# Patient Record
Sex: Male | Born: 1945
Health system: Southern US, Community
[De-identification: ages and names within clinical notes are randomized; demographics above are authoritative.]

## PROBLEM LIST (undated history)

## (undated) DIAGNOSIS — I499 Cardiac arrhythmia, unspecified: Secondary | ICD-10-CM

## (undated) DIAGNOSIS — E785 Hyperlipidemia, unspecified: Secondary | ICD-10-CM

## (undated) DIAGNOSIS — E119 Type 2 diabetes mellitus without complications: Secondary | ICD-10-CM

## (undated) DIAGNOSIS — Z9981 Dependence on supplemental oxygen: Secondary | ICD-10-CM

## (undated) DIAGNOSIS — M549 Dorsalgia, unspecified: Secondary | ICD-10-CM

## (undated) DIAGNOSIS — Z9359 Other cystostomy status: Secondary | ICD-10-CM

## (undated) DIAGNOSIS — J449 Chronic obstructive pulmonary disease, unspecified: Secondary | ICD-10-CM

## (undated) DIAGNOSIS — R0600 Dyspnea, unspecified: Secondary | ICD-10-CM

## (undated) HISTORY — DX: Hyperlipidemia, unspecified: E78.5

## (undated) HISTORY — DX: Dorsalgia, unspecified: M54.9

## (undated) HISTORY — DX: Chronic obstructive pulmonary disease, unspecified: J44.9

## (undated) HISTORY — PX: OTHER SURGICAL HISTORY: SHX169

---

## 2005-05-24 ENCOUNTER — Ambulatory Visit: Payer: Self-pay | Admitting: Internal Medicine

## 2005-07-31 ENCOUNTER — Ambulatory Visit: Payer: Self-pay | Admitting: Internal Medicine

## 2005-07-31 ENCOUNTER — Ambulatory Visit (HOSPITAL_COMMUNITY): Admission: RE | Admit: 2005-07-31 | Discharge: 2005-07-31 | Payer: Self-pay | Admitting: Internal Medicine

## 2005-10-03 ENCOUNTER — Ambulatory Visit (HOSPITAL_COMMUNITY): Admission: RE | Admit: 2005-10-03 | Discharge: 2005-10-03 | Payer: Self-pay | Admitting: Pulmonary Disease

## 2005-10-31 ENCOUNTER — Ambulatory Visit: Payer: Self-pay | Admitting: Internal Medicine

## 2005-10-31 LAB — HM COLONOSCOPY

## 2006-11-11 IMAGING — CR DG CHEST 2V
2 series · 2 of 2 positions shown · non-contrast
Comparison: none

HISTORY: Cough, former smoker

CHEST 2 VIEWS:
No prior studies for comparison.
Normal heart size, mediastinal contours, and pulmonary vascularity.
Changes of COPD and bronchitis.
No infiltrate, effusion, or mass.
Minimal degenerative changes thoracic spine.
No acute process seen.

[view not recorded (1 of 2)]
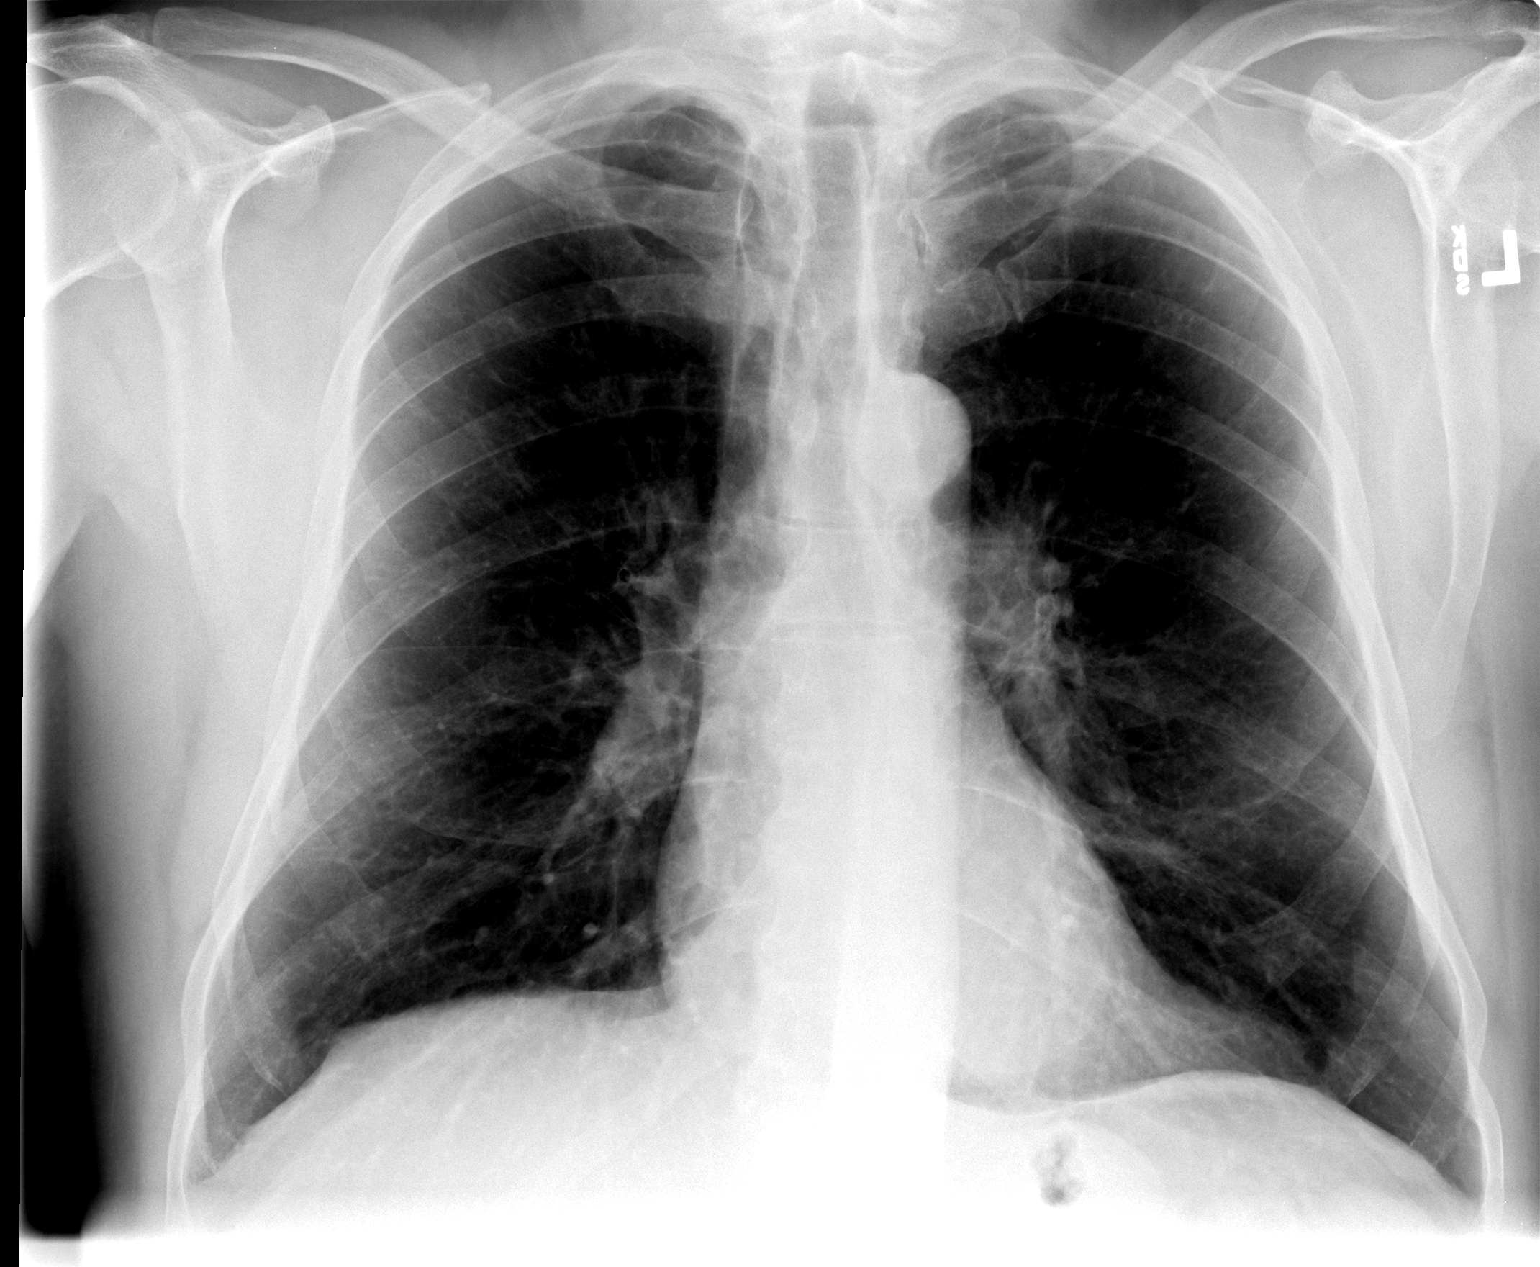

[view not recorded (2 of 2)]
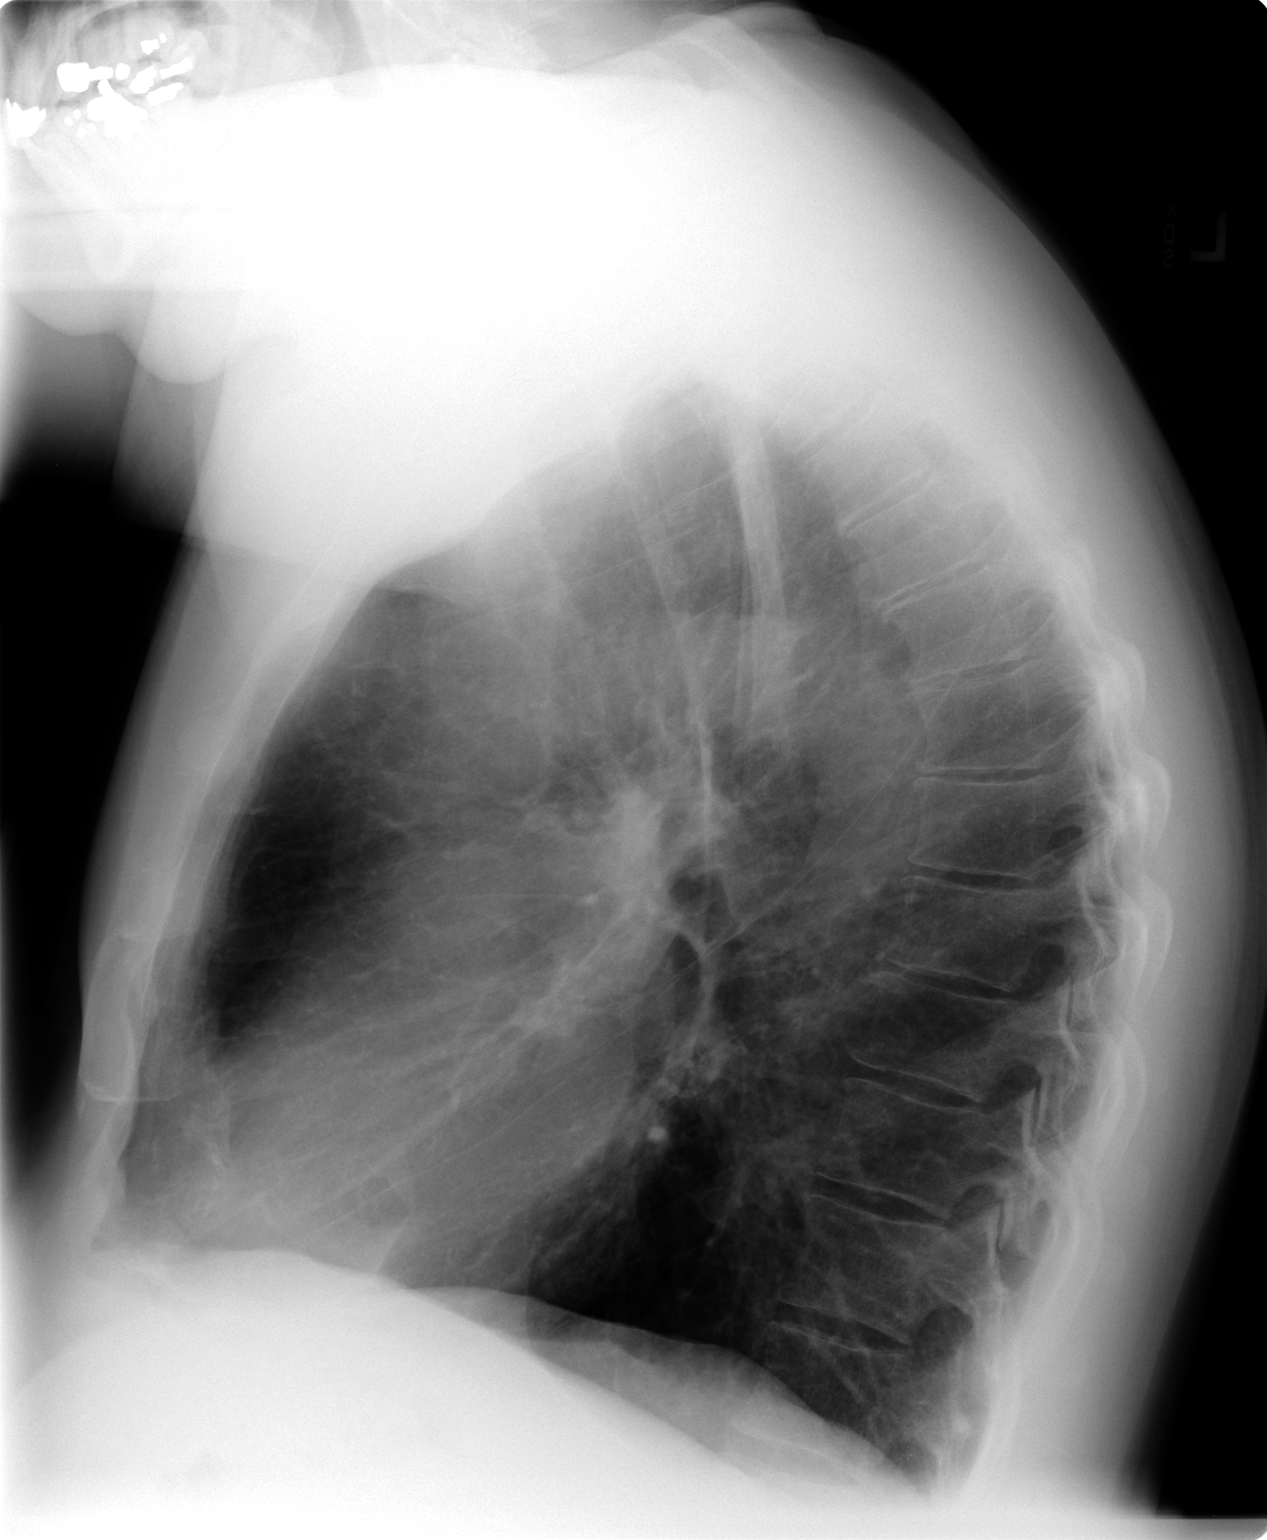

[2 of 2 positions shown; findings below may reference images not displayed]

IMPRESSION: COPD and bronchitic changes.
No acute infiltrate.

## 2011-04-20 DIAGNOSIS — E785 Hyperlipidemia, unspecified: Secondary | ICD-10-CM | POA: Diagnosis not present

## 2011-04-20 DIAGNOSIS — F3289 Other specified depressive episodes: Secondary | ICD-10-CM | POA: Diagnosis not present

## 2011-04-20 DIAGNOSIS — F17200 Nicotine dependence, unspecified, uncomplicated: Secondary | ICD-10-CM | POA: Diagnosis not present

## 2011-04-20 DIAGNOSIS — K2100 Gastro-esophageal reflux disease with esophagitis, without bleeding: Secondary | ICD-10-CM | POA: Diagnosis not present

## 2011-04-20 DIAGNOSIS — F329 Major depressive disorder, single episode, unspecified: Secondary | ICD-10-CM | POA: Diagnosis not present

## 2011-10-20 DIAGNOSIS — E785 Hyperlipidemia, unspecified: Secondary | ICD-10-CM | POA: Diagnosis not present

## 2011-10-20 DIAGNOSIS — K2100 Gastro-esophageal reflux disease with esophagitis, without bleeding: Secondary | ICD-10-CM | POA: Diagnosis not present

## 2011-10-20 DIAGNOSIS — M545 Low back pain, unspecified: Secondary | ICD-10-CM | POA: Diagnosis not present

## 2011-10-20 DIAGNOSIS — IMO0002 Reserved for concepts with insufficient information to code with codable children: Secondary | ICD-10-CM | POA: Diagnosis not present

## 2011-10-20 DIAGNOSIS — Z125 Encounter for screening for malignant neoplasm of prostate: Secondary | ICD-10-CM | POA: Diagnosis not present

## 2012-01-29 DIAGNOSIS — J029 Acute pharyngitis, unspecified: Secondary | ICD-10-CM | POA: Diagnosis not present

## 2012-04-17 DIAGNOSIS — M545 Low back pain, unspecified: Secondary | ICD-10-CM | POA: Diagnosis not present

## 2012-04-17 DIAGNOSIS — J449 Chronic obstructive pulmonary disease, unspecified: Secondary | ICD-10-CM | POA: Diagnosis not present

## 2012-04-17 DIAGNOSIS — E785 Hyperlipidemia, unspecified: Secondary | ICD-10-CM | POA: Diagnosis not present

## 2012-04-17 DIAGNOSIS — I10 Essential (primary) hypertension: Secondary | ICD-10-CM | POA: Diagnosis not present

## 2012-04-17 LAB — PULMONARY FUNCTION TEST

## 2012-05-27 DIAGNOSIS — M545 Low back pain, unspecified: Secondary | ICD-10-CM | POA: Diagnosis not present

## 2012-05-27 DIAGNOSIS — F17200 Nicotine dependence, unspecified, uncomplicated: Secondary | ICD-10-CM | POA: Diagnosis not present

## 2012-05-27 DIAGNOSIS — J3089 Other allergic rhinitis: Secondary | ICD-10-CM | POA: Diagnosis not present

## 2012-05-27 DIAGNOSIS — J449 Chronic obstructive pulmonary disease, unspecified: Secondary | ICD-10-CM | POA: Diagnosis not present

## 2012-05-29 ENCOUNTER — Telehealth: Payer: Self-pay

## 2012-05-29 NOTE — Telephone Encounter (Signed)
Pt was referred by Dr. Juanetta Gosling office for screening colonoscopy if it is time. According to our records, his last colonoscopy was 07/31/2005 by [facility]. Hyperplastic polyps, so next would be due in 07/2015 unless he is having problems.   I tried to call the pt at the number listed of [phone] and was told that it belongs to someone else.   I will mail the pt a letter to call our office.

## 2012-06-11 NOTE — Telephone Encounter (Signed)
Letter to pt

## 2012-06-17 NOTE — Telephone Encounter (Signed)
Requested the path report from [facility] medical records. He had hyperplastic polyp in 2007. Not due next  Colonoscopy until 07/2015. It is already nic'd in the computer. Pt is aware and said he is not having any problems and no new family hx of colon cancer. He will call if he has problems. Sending a letter to Dr. Juanetta Gosling.

## 2012-06-18 NOTE — Telephone Encounter (Signed)
Agree 

## 2012-08-27 DIAGNOSIS — K2100 Gastro-esophageal reflux disease with esophagitis, without bleeding: Secondary | ICD-10-CM | POA: Diagnosis not present

## 2012-08-27 DIAGNOSIS — M545 Low back pain, unspecified: Secondary | ICD-10-CM | POA: Diagnosis not present

## 2012-08-27 DIAGNOSIS — R609 Edema, unspecified: Secondary | ICD-10-CM | POA: Diagnosis not present

## 2012-08-27 DIAGNOSIS — J449 Chronic obstructive pulmonary disease, unspecified: Secondary | ICD-10-CM | POA: Diagnosis not present

## 2012-11-18 DIAGNOSIS — R609 Edema, unspecified: Secondary | ICD-10-CM | POA: Diagnosis not present

## 2012-11-18 DIAGNOSIS — M545 Low back pain, unspecified: Secondary | ICD-10-CM | POA: Diagnosis not present

## 2012-11-18 DIAGNOSIS — Z23 Encounter for immunization: Secondary | ICD-10-CM | POA: Diagnosis not present

## 2012-11-18 DIAGNOSIS — J449 Chronic obstructive pulmonary disease, unspecified: Secondary | ICD-10-CM | POA: Diagnosis not present

## 2012-11-27 DIAGNOSIS — J449 Chronic obstructive pulmonary disease, unspecified: Secondary | ICD-10-CM | POA: Diagnosis not present

## 2012-11-27 DIAGNOSIS — G4700 Insomnia, unspecified: Secondary | ICD-10-CM | POA: Diagnosis not present

## 2012-11-27 DIAGNOSIS — M545 Low back pain, unspecified: Secondary | ICD-10-CM | POA: Diagnosis not present

## 2012-11-27 DIAGNOSIS — E785 Hyperlipidemia, unspecified: Secondary | ICD-10-CM | POA: Diagnosis not present

## 2012-11-27 DIAGNOSIS — Z23 Encounter for immunization: Secondary | ICD-10-CM | POA: Diagnosis not present

## 2013-02-27 DIAGNOSIS — K2100 Gastro-esophageal reflux disease with esophagitis, without bleeding: Secondary | ICD-10-CM | POA: Diagnosis not present

## 2013-02-27 DIAGNOSIS — M545 Low back pain, unspecified: Secondary | ICD-10-CM | POA: Diagnosis not present

## 2013-02-27 DIAGNOSIS — J449 Chronic obstructive pulmonary disease, unspecified: Secondary | ICD-10-CM | POA: Diagnosis not present

## 2013-02-27 DIAGNOSIS — E785 Hyperlipidemia, unspecified: Secondary | ICD-10-CM | POA: Diagnosis not present

## 2013-03-05 DIAGNOSIS — E785 Hyperlipidemia, unspecified: Secondary | ICD-10-CM | POA: Diagnosis not present

## 2013-03-05 DIAGNOSIS — M545 Low back pain, unspecified: Secondary | ICD-10-CM | POA: Diagnosis not present

## 2013-03-05 DIAGNOSIS — J4489 Other specified chronic obstructive pulmonary disease: Secondary | ICD-10-CM | POA: Diagnosis not present

## 2013-03-05 DIAGNOSIS — J449 Chronic obstructive pulmonary disease, unspecified: Secondary | ICD-10-CM | POA: Diagnosis not present

## 2013-03-05 DIAGNOSIS — K2100 Gastro-esophageal reflux disease with esophagitis, without bleeding: Secondary | ICD-10-CM | POA: Diagnosis not present

## 2013-03-05 DIAGNOSIS — Z125 Encounter for screening for malignant neoplasm of prostate: Secondary | ICD-10-CM | POA: Diagnosis not present

## 2013-06-26 DIAGNOSIS — M545 Low back pain, unspecified: Secondary | ICD-10-CM | POA: Diagnosis not present

## 2013-06-26 DIAGNOSIS — J309 Allergic rhinitis, unspecified: Secondary | ICD-10-CM | POA: Diagnosis not present

## 2013-06-26 DIAGNOSIS — J449 Chronic obstructive pulmonary disease, unspecified: Secondary | ICD-10-CM | POA: Diagnosis not present

## 2013-06-26 DIAGNOSIS — K2100 Gastro-esophageal reflux disease with esophagitis, without bleeding: Secondary | ICD-10-CM | POA: Diagnosis not present

## 2013-10-21 DIAGNOSIS — E785 Hyperlipidemia, unspecified: Secondary | ICD-10-CM | POA: Diagnosis not present

## 2013-10-21 DIAGNOSIS — M545 Low back pain, unspecified: Secondary | ICD-10-CM | POA: Diagnosis not present

## 2013-10-21 DIAGNOSIS — J449 Chronic obstructive pulmonary disease, unspecified: Secondary | ICD-10-CM | POA: Diagnosis not present

## 2013-10-21 DIAGNOSIS — K2100 Gastro-esophageal reflux disease with esophagitis, without bleeding: Secondary | ICD-10-CM | POA: Diagnosis not present

## 2013-10-21 DIAGNOSIS — Z23 Encounter for immunization: Secondary | ICD-10-CM | POA: Diagnosis not present

## 2014-02-20 DIAGNOSIS — J449 Chronic obstructive pulmonary disease, unspecified: Secondary | ICD-10-CM | POA: Diagnosis not present

## 2014-02-20 DIAGNOSIS — E782 Mixed hyperlipidemia: Secondary | ICD-10-CM | POA: Diagnosis not present

## 2014-02-20 DIAGNOSIS — K210 Gastro-esophageal reflux disease with esophagitis: Secondary | ICD-10-CM | POA: Diagnosis not present

## 2014-02-20 DIAGNOSIS — M545 Low back pain: Secondary | ICD-10-CM | POA: Diagnosis not present

## 2014-06-22 DIAGNOSIS — K219 Gastro-esophageal reflux disease without esophagitis: Secondary | ICD-10-CM | POA: Diagnosis not present

## 2014-06-22 DIAGNOSIS — M545 Low back pain: Secondary | ICD-10-CM | POA: Diagnosis not present

## 2014-06-22 DIAGNOSIS — E782 Mixed hyperlipidemia: Secondary | ICD-10-CM | POA: Diagnosis not present

## 2014-06-22 DIAGNOSIS — J449 Chronic obstructive pulmonary disease, unspecified: Secondary | ICD-10-CM | POA: Diagnosis not present

## 2014-10-16 DIAGNOSIS — K219 Gastro-esophageal reflux disease without esophagitis: Secondary | ICD-10-CM | POA: Diagnosis not present

## 2014-10-16 DIAGNOSIS — M545 Low back pain: Secondary | ICD-10-CM | POA: Diagnosis not present

## 2014-10-16 DIAGNOSIS — E782 Mixed hyperlipidemia: Secondary | ICD-10-CM | POA: Diagnosis not present

## 2014-10-16 DIAGNOSIS — J449 Chronic obstructive pulmonary disease, unspecified: Secondary | ICD-10-CM | POA: Diagnosis not present

## 2015-06-21 ENCOUNTER — Telehealth: Payer: Self-pay | Admitting: Internal Medicine

## 2015-06-21 NOTE — Telephone Encounter (Signed)
RECALL FOR TCS °

## 2015-06-22 NOTE — Telephone Encounter (Signed)
Letter mailed to pt.  

## 2016-04-05 DIAGNOSIS — M79671 Pain in right foot: Secondary | ICD-10-CM | POA: Diagnosis not present

## 2016-04-05 DIAGNOSIS — E782 Mixed hyperlipidemia: Secondary | ICD-10-CM | POA: Diagnosis not present

## 2016-04-05 DIAGNOSIS — F321 Major depressive disorder, single episode, moderate: Secondary | ICD-10-CM | POA: Diagnosis not present

## 2016-04-05 DIAGNOSIS — J449 Chronic obstructive pulmonary disease, unspecified: Secondary | ICD-10-CM | POA: Diagnosis not present

## 2016-04-05 DIAGNOSIS — M545 Low back pain: Secondary | ICD-10-CM | POA: Diagnosis not present

## 2016-07-04 DIAGNOSIS — J449 Chronic obstructive pulmonary disease, unspecified: Secondary | ICD-10-CM | POA: Diagnosis not present

## 2016-07-04 DIAGNOSIS — K219 Gastro-esophageal reflux disease without esophagitis: Secondary | ICD-10-CM | POA: Diagnosis not present

## 2016-07-04 DIAGNOSIS — M545 Low back pain: Secondary | ICD-10-CM | POA: Diagnosis not present

## 2016-07-04 DIAGNOSIS — M1000 Idiopathic gout, unspecified site: Secondary | ICD-10-CM | POA: Diagnosis not present

## 2016-10-02 DIAGNOSIS — Z1211 Encounter for screening for malignant neoplasm of colon: Secondary | ICD-10-CM | POA: Diagnosis not present

## 2016-10-02 DIAGNOSIS — G4700 Insomnia, unspecified: Secondary | ICD-10-CM | POA: Diagnosis not present

## 2016-10-02 DIAGNOSIS — Z0000 Encounter for general adult medical examination without abnormal findings: Secondary | ICD-10-CM | POA: Diagnosis not present

## 2016-10-02 DIAGNOSIS — F321 Major depressive disorder, single episode, moderate: Secondary | ICD-10-CM | POA: Diagnosis not present

## 2016-10-02 DIAGNOSIS — M545 Low back pain: Secondary | ICD-10-CM | POA: Diagnosis not present

## 2016-10-02 DIAGNOSIS — K219 Gastro-esophageal reflux disease without esophagitis: Secondary | ICD-10-CM | POA: Diagnosis not present

## 2016-10-02 DIAGNOSIS — Z125 Encounter for screening for malignant neoplasm of prostate: Secondary | ICD-10-CM | POA: Diagnosis not present

## 2016-10-02 DIAGNOSIS — E782 Mixed hyperlipidemia: Secondary | ICD-10-CM | POA: Diagnosis not present

## 2016-10-02 DIAGNOSIS — M1000 Idiopathic gout, unspecified site: Secondary | ICD-10-CM | POA: Diagnosis not present

## 2016-10-02 DIAGNOSIS — J449 Chronic obstructive pulmonary disease, unspecified: Secondary | ICD-10-CM | POA: Diagnosis not present

## 2016-10-10 LAB — HEMOGLOBIN A1C: Hemoglobin A1C: 5.8

## 2016-10-27 DIAGNOSIS — Z1211 Encounter for screening for malignant neoplasm of colon: Secondary | ICD-10-CM | POA: Diagnosis not present

## 2016-11-30 DIAGNOSIS — Z23 Encounter for immunization: Secondary | ICD-10-CM | POA: Diagnosis not present

## 2017-04-04 DIAGNOSIS — M545 Low back pain: Secondary | ICD-10-CM | POA: Diagnosis not present

## 2017-04-04 DIAGNOSIS — J449 Chronic obstructive pulmonary disease, unspecified: Secondary | ICD-10-CM | POA: Diagnosis not present

## 2017-04-04 DIAGNOSIS — K210 Gastro-esophageal reflux disease with esophagitis: Secondary | ICD-10-CM | POA: Diagnosis not present

## 2017-04-04 DIAGNOSIS — E785 Hyperlipidemia, unspecified: Secondary | ICD-10-CM | POA: Diagnosis not present

## 2017-10-02 DIAGNOSIS — K210 Gastro-esophageal reflux disease with esophagitis: Secondary | ICD-10-CM | POA: Diagnosis not present

## 2017-10-02 DIAGNOSIS — E785 Hyperlipidemia, unspecified: Secondary | ICD-10-CM | POA: Diagnosis not present

## 2017-10-02 DIAGNOSIS — J449 Chronic obstructive pulmonary disease, unspecified: Secondary | ICD-10-CM | POA: Diagnosis not present

## 2017-10-02 DIAGNOSIS — M545 Low back pain: Secondary | ICD-10-CM | POA: Diagnosis not present

## 2017-10-02 DIAGNOSIS — Z23 Encounter for immunization: Secondary | ICD-10-CM | POA: Diagnosis not present

## 2017-10-03 DIAGNOSIS — J449 Chronic obstructive pulmonary disease, unspecified: Secondary | ICD-10-CM | POA: Diagnosis not present

## 2017-10-03 DIAGNOSIS — K210 Gastro-esophageal reflux disease with esophagitis: Secondary | ICD-10-CM | POA: Diagnosis not present

## 2017-10-03 DIAGNOSIS — E785 Hyperlipidemia, unspecified: Secondary | ICD-10-CM | POA: Diagnosis not present

## 2017-10-03 DIAGNOSIS — M545 Low back pain: Secondary | ICD-10-CM | POA: Diagnosis not present

## 2017-10-03 DIAGNOSIS — Z0000 Encounter for general adult medical examination without abnormal findings: Secondary | ICD-10-CM | POA: Diagnosis not present

## 2017-10-03 DIAGNOSIS — Z125 Encounter for screening for malignant neoplasm of prostate: Secondary | ICD-10-CM | POA: Diagnosis not present

## 2017-10-03 LAB — PSA: PSA: 1.7

## 2017-10-04 LAB — LIPID PANEL: LDL Cholesterol: 119

## 2017-12-18 DIAGNOSIS — Z23 Encounter for immunization: Secondary | ICD-10-CM | POA: Diagnosis not present

## 2018-04-02 DIAGNOSIS — M545 Low back pain: Secondary | ICD-10-CM | POA: Diagnosis not present

## 2018-04-02 DIAGNOSIS — M1000 Idiopathic gout, unspecified site: Secondary | ICD-10-CM | POA: Diagnosis not present

## 2018-04-02 DIAGNOSIS — J449 Chronic obstructive pulmonary disease, unspecified: Secondary | ICD-10-CM | POA: Diagnosis not present

## 2018-04-02 DIAGNOSIS — E785 Hyperlipidemia, unspecified: Secondary | ICD-10-CM | POA: Diagnosis not present

## 2018-09-06 ENCOUNTER — Other Ambulatory Visit: Payer: Self-pay

## 2018-10-01 DIAGNOSIS — Z0000 Encounter for general adult medical examination without abnormal findings: Secondary | ICD-10-CM | POA: Diagnosis not present

## 2018-10-01 DIAGNOSIS — Z23 Encounter for immunization: Secondary | ICD-10-CM | POA: Diagnosis not present

## 2018-10-02 ENCOUNTER — Other Ambulatory Visit (HOSPITAL_COMMUNITY): Payer: Self-pay | Admitting: Pulmonary Disease

## 2018-10-02 ENCOUNTER — Other Ambulatory Visit: Payer: Self-pay | Admitting: Pulmonary Disease

## 2018-10-02 DIAGNOSIS — J449 Chronic obstructive pulmonary disease, unspecified: Secondary | ICD-10-CM | POA: Diagnosis not present

## 2018-10-02 DIAGNOSIS — Z125 Encounter for screening for malignant neoplasm of prostate: Secondary | ICD-10-CM | POA: Diagnosis not present

## 2018-10-02 DIAGNOSIS — M79604 Pain in right leg: Secondary | ICD-10-CM

## 2018-10-02 DIAGNOSIS — M545 Low back pain: Secondary | ICD-10-CM | POA: Diagnosis not present

## 2018-10-02 DIAGNOSIS — K219 Gastro-esophageal reflux disease without esophagitis: Secondary | ICD-10-CM | POA: Diagnosis not present

## 2018-10-02 DIAGNOSIS — Z0000 Encounter for general adult medical examination without abnormal findings: Secondary | ICD-10-CM | POA: Diagnosis not present

## 2018-10-02 DIAGNOSIS — M1000 Idiopathic gout, unspecified site: Secondary | ICD-10-CM | POA: Diagnosis not present

## 2018-10-02 DIAGNOSIS — R2241 Localized swelling, mass and lump, right lower limb: Secondary | ICD-10-CM

## 2018-10-02 DIAGNOSIS — M79671 Pain in right foot: Secondary | ICD-10-CM | POA: Diagnosis not present

## 2018-10-02 DIAGNOSIS — R2242 Localized swelling, mass and lump, left lower limb: Secondary | ICD-10-CM

## 2018-10-02 DIAGNOSIS — E782 Mixed hyperlipidemia: Secondary | ICD-10-CM | POA: Diagnosis not present

## 2018-10-02 DIAGNOSIS — R739 Hyperglycemia, unspecified: Secondary | ICD-10-CM | POA: Diagnosis not present

## 2018-10-02 DIAGNOSIS — G4700 Insomnia, unspecified: Secondary | ICD-10-CM | POA: Diagnosis not present

## 2018-10-03 LAB — BASIC METABOLIC PANEL
BUN: 19 (ref 4–21)
CO2: 27 — AB (ref 13–22)
Chloride: 106 (ref 99–108)
Creatinine: 0.9 (ref <=1.3)
Glucose: 144
Potassium: 3.8 (ref 3.4–5.3)
Sodium: 142 (ref 137–147)

## 2018-10-03 LAB — PSA: PSA: 3.1

## 2018-10-03 LAB — HEPATIC FUNCTION PANEL
ALT: 17 (ref 10–40)
AST: 13 — AB (ref 14–40)
Alkaline Phosphatase: 88 (ref 25–125)
Bilirubin, Total: 0.7

## 2018-10-03 LAB — COMPREHENSIVE METABOLIC PANEL
Albumin: 4.2 (ref 3.5–5.0)
Calcium: 8.9 (ref 8.7–10.7)
GFR calc Af Amer: 99
GFR calc non Af Amer: 86
Globulin: 2.3

## 2018-10-03 LAB — LIPID PANEL
Cholesterol: 161 (ref 0–200)
HDL: 42 (ref 35–70)
LDL Cholesterol: 93
Triglycerides: 165 — AB (ref 40–160)

## 2018-10-03 LAB — CBC AND DIFFERENTIAL
HCT: 50 (ref 41–53)
Hemoglobin: 17.1 (ref 13.5–17.5)
Platelets: 216 (ref 150–399)
WBC: 9.2

## 2018-10-03 LAB — HEMOGLOBIN A1C: Hemoglobin A1C: 6.6

## 2018-10-03 LAB — TSH: TSH: 1 (ref <=5.90)

## 2018-10-03 LAB — CBC: RBC: 5.44 — AB (ref 3.87–5.11)

## 2018-10-07 ENCOUNTER — Other Ambulatory Visit: Payer: Self-pay

## 2018-10-07 ENCOUNTER — Ambulatory Visit (HOSPITAL_COMMUNITY)
Admission: RE | Admit: 2018-10-07 | Discharge: 2018-10-07 | Disposition: A | Discharge status: Home / Self Care | Payer: Medicare Other | Source: Ambulatory Visit | Attending: Pulmonary Disease | Admitting: Pulmonary Disease

## 2018-10-07 DIAGNOSIS — M7989 Other specified soft tissue disorders: Secondary | ICD-10-CM | POA: Diagnosis not present

## 2018-10-07 DIAGNOSIS — R2241 Localized swelling, mass and lump, right lower limb: Secondary | ICD-10-CM

## 2018-10-07 DIAGNOSIS — R2242 Localized swelling, mass and lump, left lower limb: Secondary | ICD-10-CM | POA: Diagnosis not present

## 2018-10-08 ENCOUNTER — Ambulatory Visit: Payer: Self-pay | Admitting: Family Medicine

## 2018-10-08 ENCOUNTER — Encounter: Payer: Self-pay | Admitting: Family Medicine

## 2018-10-08 ENCOUNTER — Ambulatory Visit (INDEPENDENT_AMBULATORY_CARE_PROVIDER_SITE_OTHER): Payer: Medicare Other | Admitting: Family Medicine

## 2018-10-08 ENCOUNTER — Other Ambulatory Visit: Payer: Self-pay

## 2018-10-08 VITALS — BP 135/82 | HR 67 | Temp 97.9°F | Ht 71.0 in | Wt 192.4 lb

## 2018-10-08 DIAGNOSIS — E785 Hyperlipidemia, unspecified: Secondary | ICD-10-CM | POA: Insufficient documentation

## 2018-10-08 DIAGNOSIS — J449 Chronic obstructive pulmonary disease, unspecified: Secondary | ICD-10-CM

## 2018-10-08 DIAGNOSIS — R600 Localized edema: Secondary | ICD-10-CM | POA: Diagnosis not present

## 2018-10-08 NOTE — Patient Instructions (Signed)
Pt to call if lung cancer screening desired Elevate legs Consider mild compression support socks
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
## 2018-10-15 DIAGNOSIS — Z1212 Encounter for screening for malignant neoplasm of rectum: Secondary | ICD-10-CM | POA: Diagnosis not present

## 2018-10-15 DIAGNOSIS — Z1211 Encounter for screening for malignant neoplasm of colon: Secondary | ICD-10-CM | POA: Diagnosis not present

## 2018-11-07 NOTE — Progress Notes (Signed)
This encounter was created in error - please disregard.

## 2019-04-07 ENCOUNTER — Ambulatory Visit (INDEPENDENT_AMBULATORY_CARE_PROVIDER_SITE_OTHER): Payer: Medicare Other | Admitting: Family Medicine

## 2019-04-07 ENCOUNTER — Other Ambulatory Visit: Payer: Self-pay

## 2019-04-07 ENCOUNTER — Encounter: Payer: Self-pay | Admitting: Family Medicine

## 2019-04-07 VITALS — BP 150/80 | HR 68 | Temp 98.4°F | Ht 71.0 in | Wt 199.0 lb

## 2019-04-07 DIAGNOSIS — M109 Gout, unspecified: Secondary | ICD-10-CM

## 2019-04-07 DIAGNOSIS — J449 Chronic obstructive pulmonary disease, unspecified: Secondary | ICD-10-CM | POA: Diagnosis not present

## 2019-04-07 DIAGNOSIS — R030 Elevated blood-pressure reading, without diagnosis of hypertension: Secondary | ICD-10-CM

## 2019-04-07 DIAGNOSIS — R7309 Other abnormal glucose: Secondary | ICD-10-CM

## 2019-04-07 DIAGNOSIS — E785 Hyperlipidemia, unspecified: Secondary | ICD-10-CM | POA: Diagnosis not present

## 2019-04-07 NOTE — Progress Notes (Signed)
Established Patient Office Visit  Subjective:  Patient ID: James Miles, male    DOB: December 13, 1945  Age: 74 y.o. MRN: [id number]  CC:  Chief Complaint  Patient presents with  . Follow-up    6 month    HPI James Miles presents for COPD-Ventolin 2x day.   Hyperlipidemia-lipitor daily Gout- allopurinol daily-no recent attack  Past Medical History:  Diagnosis Date  . Back pain   . COPD (chronic obstructive pulmonary disease) (HCC)   . Hyperlipidemia     Past Surgical History:  Procedure Laterality Date  . hemorhoidectomy      History reviewed. No pertinent family history.  Social History   Socioeconomic History  . Marital status: Widowed    Spouse name: Not on file  . Number of children: 2  . Years of education: Not on file  . Highest education level: Not on file  Occupational History  . Not on file  Tobacco Use  . Smoking status: Former Smoker    Years: 50.00    Quit date: 08/2016    Years since quitting: 2.6  . Smokeless tobacco: Never Used  Substance and Sexual Activity  . Alcohol use: Not on file  . Drug use: Never  . Sexual activity: Not Currently  Other Topics Concern  . Not on file  Social History Narrative  . Not on file   Social Determinants of Health   Financial Resource Strain:   . Difficulty of Paying Living Expenses: Not on file  Food Insecurity:   . Worried About Programme researcher, broadcasting/film/video in the Last Year: Not on file  . Ran Out of Food in the Last Year: Not on file  Transportation Needs:   . Lack of Transportation (Medical): Not on file  . Lack of Transportation (Non-Medical): Not on file  Physical Activity:   . Days of Exercise per Week: Not on file  . Minutes of Exercise per Session: Not on file  Stress:   . Feeling of Stress : Not on file  Social Connections:   . Frequency of Communication with Friends and Family: Not on file  . Frequency of Social Gatherings with Friends and Family: Not on file  . Attends Religious Services:  Not on file  . Active Member of Clubs or Organizations: Not on file  . Attends Banker Meetings: Not on file  . Marital Status: Not on file  Intimate Partner Violence:   . Fear of Current or Ex-Partner: Not on file  . Emotionally Abused: Not on file  . Physically Abused: Not on file  . Sexually Abused: Not on file    Outpatient Medications Prior to Visit  Medication Sig Dispense Refill  . albuterol (VENTOLIN HFA) 108 (90 Base) MCG/ACT inhaler SMARTSIG:2 Puff(s) Via Inhaler 4 Times Daily PRN    . allopurinol (ZYLOPRIM) 300 MG tablet Take 300 mg by mouth daily.    Marland Kitchen atorvastatin (LIPITOR) 20 MG tablet Take 20 mg by mouth daily.     No facility-administered medications prior to visit.    No Known Allergies  ROS Review of Systems  Constitutional: Negative.   HENT: Negative.   Eyes: Negative.   Respiratory:       COPD  Cardiovascular: Negative.   Gastrointestinal: Negative.   Endocrine: Negative.   Genitourinary: Negative.   Skin: Negative.   Neurological: Negative.   Hematological: Negative.   Psychiatric/Behavioral: Negative.       Objective:    Physical Exam  Constitutional: He is  oriented to person, place, and time. He appears well-developed and well-nourished.  HENT:  Head: Normocephalic and atraumatic.  Eyes: Conjunctivae are normal.  Cardiovascular: Normal rate, regular rhythm, normal heart sounds and intact distal pulses.  Pulmonary/Chest: Effort normal and breath sounds normal.  Musculoskeletal:        General: Edema present.     Cervical back: Normal range of motion and neck supple.  Neurological: He is oriented to person, place, and time.  Psychiatric: He has a normal mood and affect. His behavior is normal.   Right manual recheck 142/82  BP (!) 150/80 (BP Location: Left Arm, Patient Position: Sitting)   Pulse 68   Temp 98.4 F (36.9 C) (Temporal)   Ht 5\' 11"  (1.803 m)   Wt 199 lb (90.3 kg)   SpO2 93%   BMI 27.75 kg/m  Wt Readings  from Last 3 Encounters:  04/07/19 199 lb (90.3 kg)  10/08/18 192 lb 6.4 oz (87.3 kg)     Health Maintenance Due  Topic Date Due  . PNA vac Low Risk Adult (1 of 2 - PCV13) 04/21/2011  . COLONOSCOPY  11/01/2015    Lab Results  Component Value Date   LDLCALC 119 10/03/2017   Lab Results  Component Value Date   HGBA1C 5.8 10/02/2016      Assessment & Plan:  1. Hyperlipidemia, unspecified hyperlipidemia type lipitor-pt had lipid panel last fall-will check with [facility]  2. Chronic obstructive pulmonary disease, unspecified COPD type ([place]) proventil MDI prn  3. Gout, unspecified cause, unspecified chronicity, unspecified site Allopurinol-no recent gout attacks  Follow-up: 6 months  Yesika Rispoli Hannah Beat, MD

## 2019-04-07 NOTE — Patient Instructions (Addendum)
Follow up with primary care every 6 months Consider a screening CT lung  Will recheck on labwork for follow up-fasting labwork suggested

## 2019-04-08 ENCOUNTER — Encounter: Payer: Self-pay | Admitting: Family Medicine

## 2019-04-10 DIAGNOSIS — M109 Gout, unspecified: Secondary | ICD-10-CM | POA: Diagnosis not present

## 2019-04-10 DIAGNOSIS — E785 Hyperlipidemia, unspecified: Secondary | ICD-10-CM | POA: Diagnosis not present

## 2019-04-10 DIAGNOSIS — R7309 Other abnormal glucose: Secondary | ICD-10-CM | POA: Diagnosis not present

## 2019-04-11 LAB — COMPLETE METABOLIC PANEL WITH GFR
AG Ratio: 1.8 (calc) (ref 1.0–2.5)
ALT: 16 U/L (ref 9–46)
AST: 13 U/L (ref 10–35)
Albumin: 4.4 g/dL (ref 3.6–5.1)
Alkaline phosphatase (APISO): 94 U/L (ref 35–144)
BUN: 12 mg/dL (ref 7–25)
CO2: 30 mmol/L (ref 20–32)
Calcium: 9.4 mg/dL (ref 8.6–10.3)
Chloride: 101 mmol/L (ref 98–110)
Creat: 0.73 mg/dL (ref 0.70–1.18)
GFR, Est African American: 107 mL/min/{1.73_m2} (ref >=60)
GFR, Est Non African American: 93 mL/min/{1.73_m2} (ref >=60)
Globulin: 2.4 g/dL (calc) (ref 1.9–3.7)
Glucose, Bld: 120 mg/dL — ABNORMAL HIGH (ref 65–99)
Potassium: 4 mmol/L (ref 3.5–5.3)
Sodium: 140 mmol/L (ref 135–146)
Total Bilirubin: 0.6 mg/dL (ref 0.2–1.2)
Total Protein: 6.8 g/dL (ref 6.1–8.1)

## 2019-04-11 LAB — URIC ACID: Uric Acid, Serum: 4.7 mg/dL (ref 4.0–8.0)

## 2019-04-11 LAB — HEMOGLOBIN A1C
Hgb A1c MFr Bld: 6.9 % of total Hgb — ABNORMAL HIGH (ref <5.7)
Mean Plasma Glucose: 151 (calc)
eAG (mmol/L): 8.4 (calc)

## 2019-05-01 DIAGNOSIS — R600 Localized edema: Secondary | ICD-10-CM | POA: Diagnosis not present

## 2019-05-01 DIAGNOSIS — M109 Gout, unspecified: Secondary | ICD-10-CM | POA: Diagnosis not present

## 2019-05-01 DIAGNOSIS — E785 Hyperlipidemia, unspecified: Secondary | ICD-10-CM | POA: Diagnosis not present

## 2019-05-01 DIAGNOSIS — J449 Chronic obstructive pulmonary disease, unspecified: Secondary | ICD-10-CM | POA: Diagnosis not present

## 2019-05-29 DIAGNOSIS — E785 Hyperlipidemia, unspecified: Secondary | ICD-10-CM | POA: Diagnosis not present

## 2019-05-29 DIAGNOSIS — Z79899 Other long term (current) drug therapy: Secondary | ICD-10-CM | POA: Diagnosis not present

## 2019-06-03 DIAGNOSIS — E785 Hyperlipidemia, unspecified: Secondary | ICD-10-CM | POA: Diagnosis not present

## 2019-06-03 DIAGNOSIS — E1140 Type 2 diabetes mellitus with diabetic neuropathy, unspecified: Secondary | ICD-10-CM | POA: Diagnosis not present

## 2019-06-03 DIAGNOSIS — J449 Chronic obstructive pulmonary disease, unspecified: Secondary | ICD-10-CM | POA: Diagnosis not present

## 2019-06-03 DIAGNOSIS — M109 Gout, unspecified: Secondary | ICD-10-CM | POA: Diagnosis not present

## 2019-06-03 DIAGNOSIS — R600 Localized edema: Secondary | ICD-10-CM | POA: Diagnosis not present

## 2019-06-03 DIAGNOSIS — Z79899 Other long term (current) drug therapy: Secondary | ICD-10-CM | POA: Diagnosis not present

## 2019-06-03 DIAGNOSIS — Z0001 Encounter for general adult medical examination with abnormal findings: Secondary | ICD-10-CM | POA: Diagnosis not present

## 2019-08-28 DIAGNOSIS — E1140 Type 2 diabetes mellitus with diabetic neuropathy, unspecified: Secondary | ICD-10-CM | POA: Diagnosis not present

## 2019-08-28 DIAGNOSIS — E785 Hyperlipidemia, unspecified: Secondary | ICD-10-CM | POA: Diagnosis not present

## 2019-09-03 DIAGNOSIS — Z79899 Other long term (current) drug therapy: Secondary | ICD-10-CM | POA: Diagnosis not present

## 2019-09-03 DIAGNOSIS — Z7984 Long term (current) use of oral hypoglycemic drugs: Secondary | ICD-10-CM | POA: Diagnosis not present

## 2019-09-03 DIAGNOSIS — R600 Localized edema: Secondary | ICD-10-CM | POA: Diagnosis not present

## 2019-09-03 DIAGNOSIS — E785 Hyperlipidemia, unspecified: Secondary | ICD-10-CM | POA: Diagnosis not present

## 2019-09-03 DIAGNOSIS — M109 Gout, unspecified: Secondary | ICD-10-CM | POA: Diagnosis not present

## 2019-09-03 DIAGNOSIS — E119 Type 2 diabetes mellitus without complications: Secondary | ICD-10-CM | POA: Diagnosis not present

## 2019-10-01 DIAGNOSIS — E119 Type 2 diabetes mellitus without complications: Secondary | ICD-10-CM | POA: Diagnosis not present

## 2019-10-01 DIAGNOSIS — E785 Hyperlipidemia, unspecified: Secondary | ICD-10-CM | POA: Diagnosis not present

## 2019-10-01 DIAGNOSIS — M109 Gout, unspecified: Secondary | ICD-10-CM | POA: Diagnosis not present

## 2019-10-01 DIAGNOSIS — Z79899 Other long term (current) drug therapy: Secondary | ICD-10-CM | POA: Diagnosis not present

## 2019-10-01 DIAGNOSIS — R600 Localized edema: Secondary | ICD-10-CM | POA: Diagnosis not present

## 2019-10-24 DIAGNOSIS — Z23 Encounter for immunization: Secondary | ICD-10-CM | POA: Diagnosis not present

## 2019-11-15 IMAGING — US US EXTREM LOW VENOUS
1 series · 13 of 24 positions shown · non-contrast
Comparison: None.

CLINICAL DATA: Bilateral lower extremity swelling.



[Series 1: us extrem low venous · 13 of 69 slices shown]
[im 1/69]
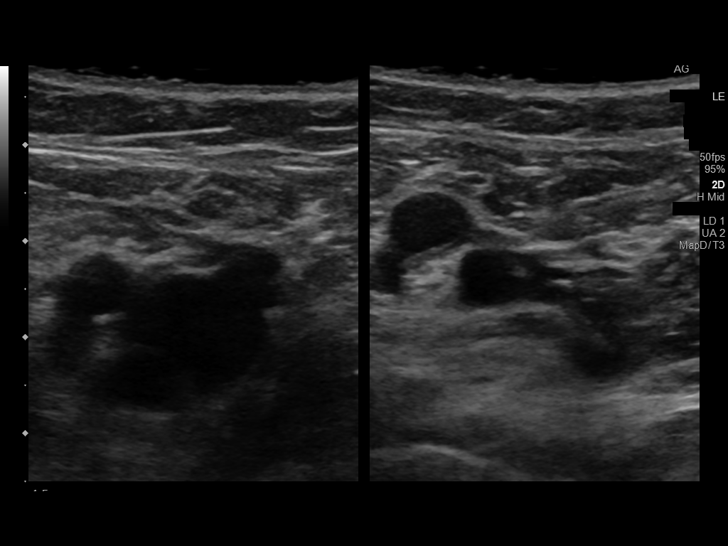
[im 6/69]
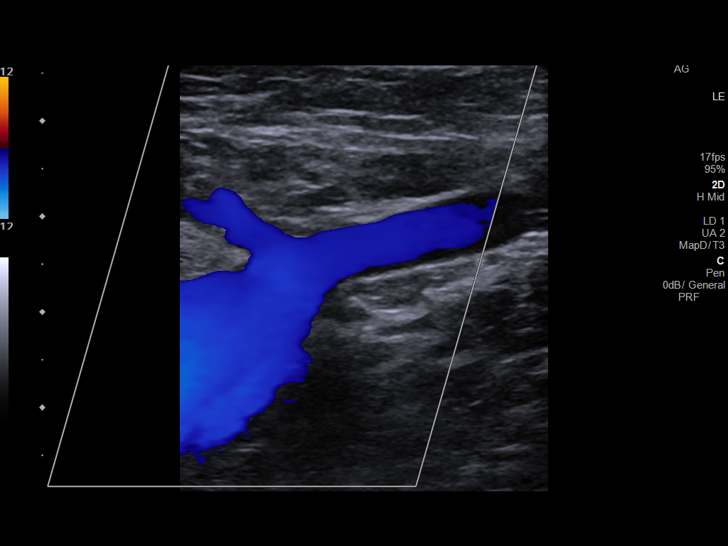
[im 12/69]
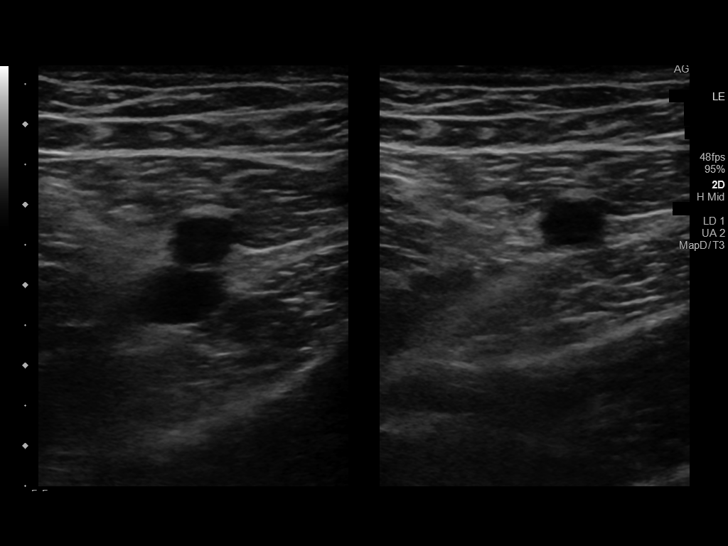
[im 18/69]
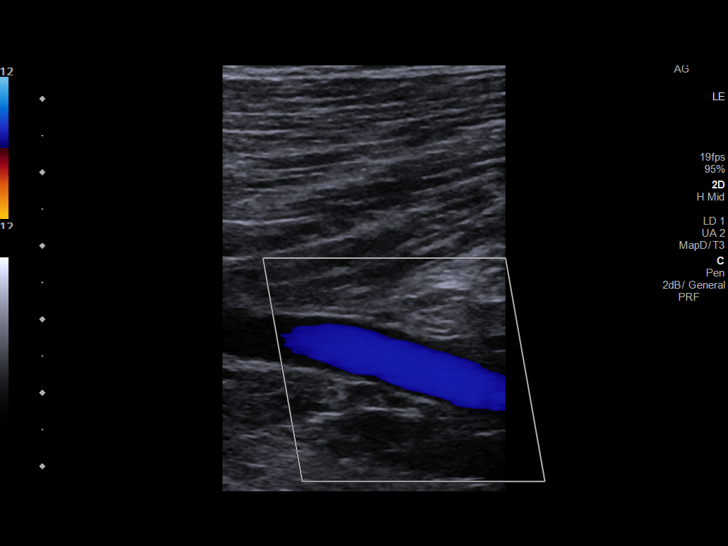
[im 24/69]
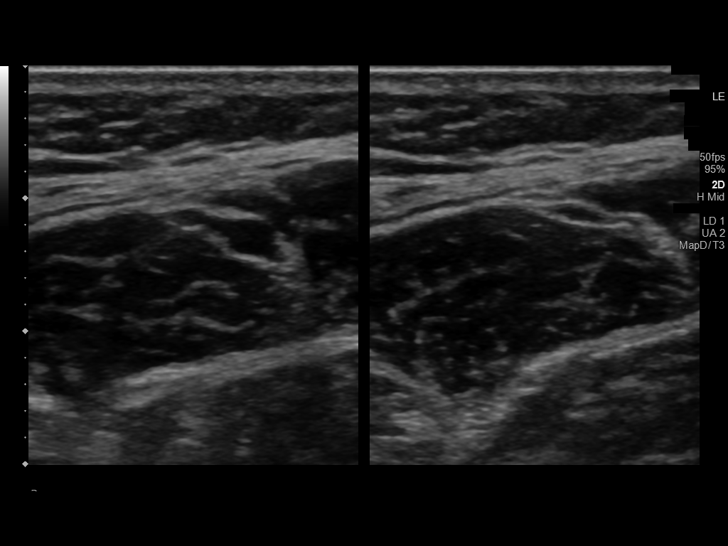
[im 30/69]
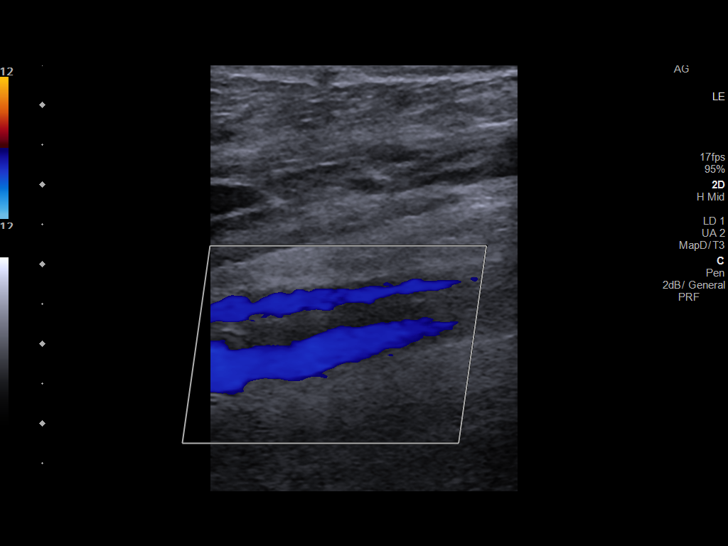
[im 36/69]
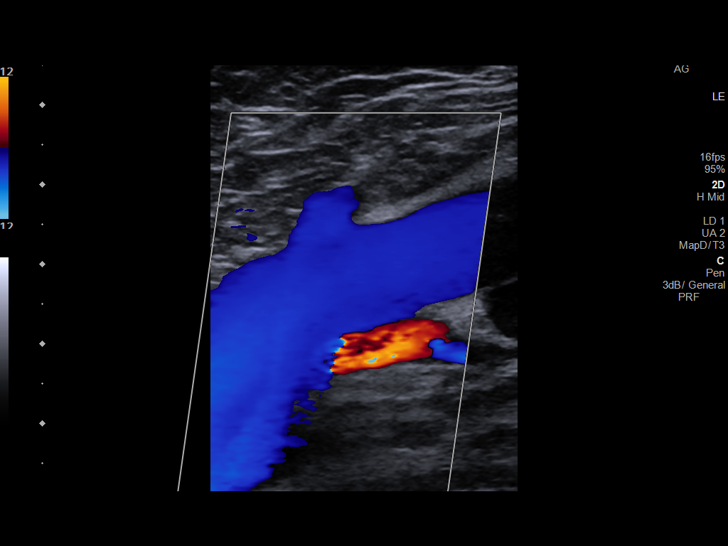
[im 39/69]
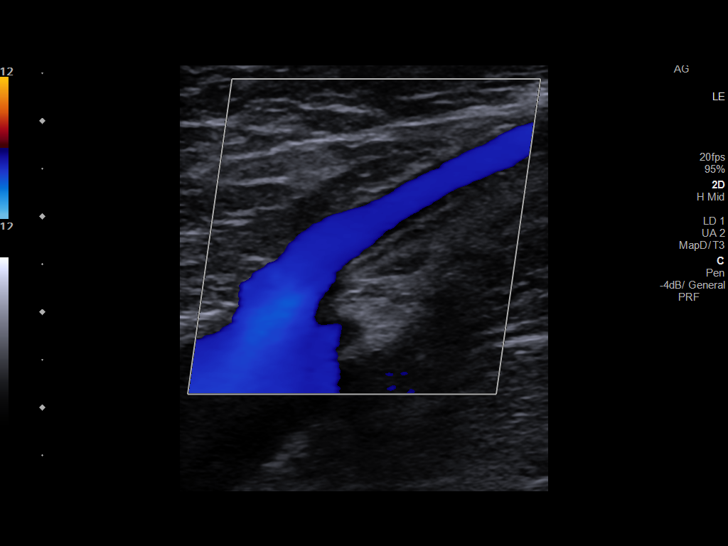
[im 45/69]
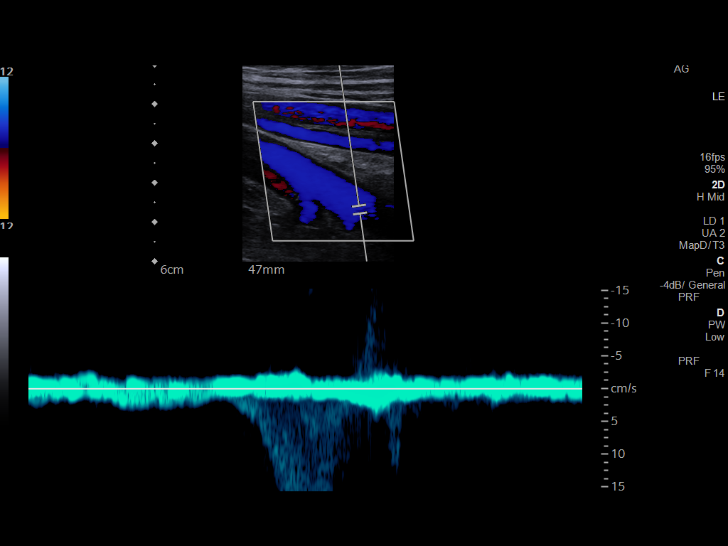
[im 51/69]
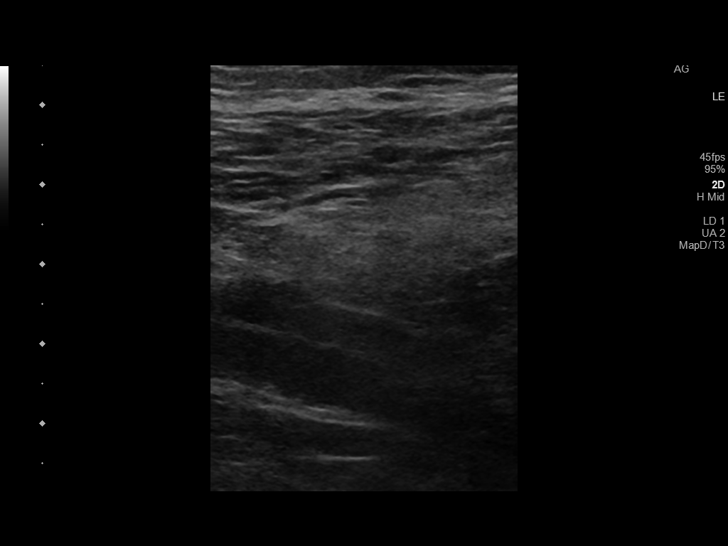
[im 57/69]
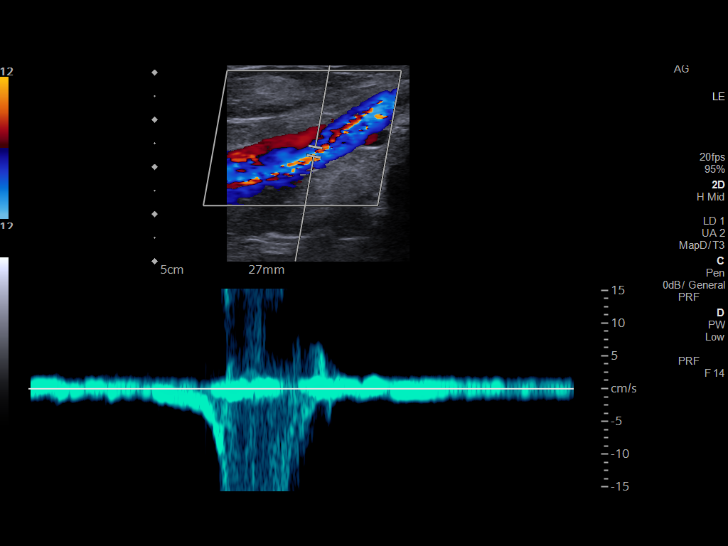
[im 63/69]
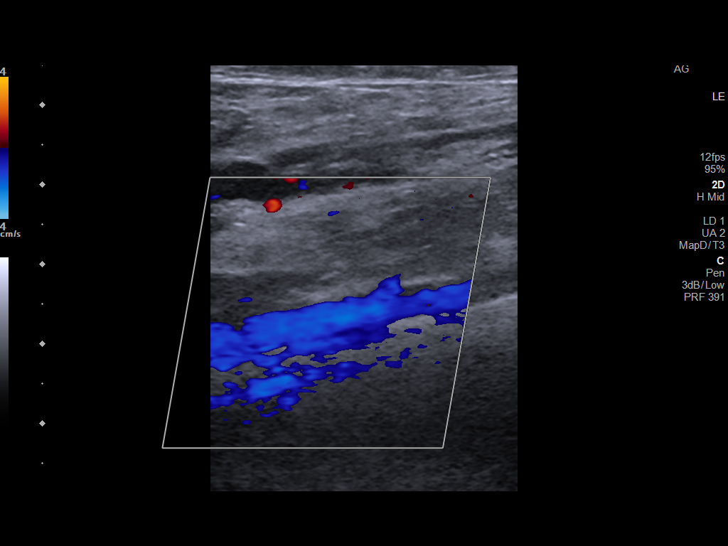
[im 69/69]
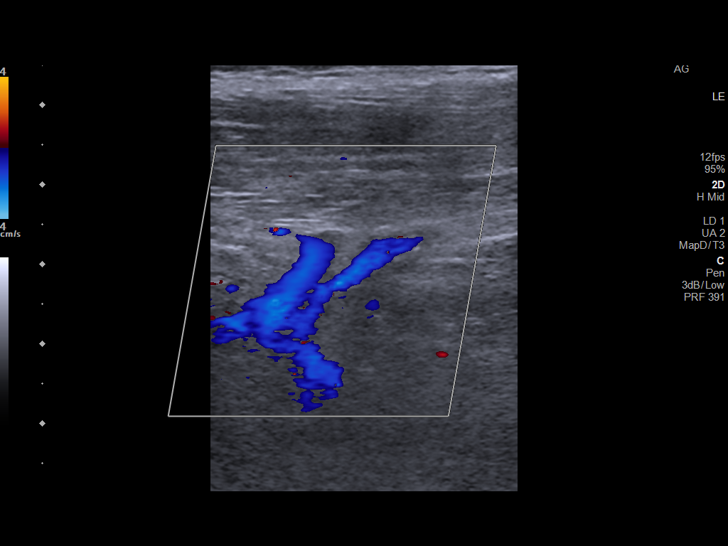

[13 of 24 positions shown; findings below may reference images not displayed]

FINDINGS: RIGHT LOWER EXTREMITY

Common Femoral Vein: No evidence of thrombus. Normal
compressibility, respiratory phasicity and response to augmentation.

Saphenofemoral Junction: No evidence of thrombus. Normal
compressibility and flow on color Doppler imaging.

Profunda Femoral Vein: No evidence of thrombus. Normal
compressibility and flow on color Doppler imaging.

Femoral Vein: No evidence of thrombus. Normal compressibility,
respiratory phasicity and response to augmentation.

Popliteal Vein: No evidence of thrombus. Normal compressibility,
respiratory phasicity and response to augmentation.

Calf Veins: No evidence of thrombus. Normal compressibility and flow
on color Doppler imaging.

Superficial Great Saphenous Vein: No evidence of thrombus. Normal
compressibility.

Other Findings:  None.

LEFT LOWER EXTREMITY

Common Femoral Vein: No evidence of thrombus. Normal
compressibility, respiratory phasicity and response to augmentation.

Saphenofemoral Junction: No evidence of thrombus. Normal
compressibility and flow on color Doppler imaging.

Profunda Femoral Vein: No evidence of thrombus. Normal
compressibility and flow on color Doppler imaging.

Femoral Vein: No evidence of thrombus. Normal compressibility,
respiratory phasicity and response to augmentation.

Popliteal Vein: No evidence of thrombus. Normal compressibility,
respiratory phasicity and response to augmentation.

Calf Veins: No evidence of thrombus. Normal compressibility and flow
on color Doppler imaging.

Other Findings: None.
IMPRESSION: No evidence of deep venous thrombosis in either lower extremity.

## 2019-11-21 DIAGNOSIS — Z23 Encounter for immunization: Secondary | ICD-10-CM | POA: Diagnosis not present

## 2019-12-01 DIAGNOSIS — Z79899 Other long term (current) drug therapy: Secondary | ICD-10-CM | POA: Diagnosis not present

## 2019-12-01 DIAGNOSIS — Z7984 Long term (current) use of oral hypoglycemic drugs: Secondary | ICD-10-CM | POA: Diagnosis not present

## 2019-12-01 DIAGNOSIS — Z23 Encounter for immunization: Secondary | ICD-10-CM | POA: Diagnosis not present

## 2019-12-01 DIAGNOSIS — E119 Type 2 diabetes mellitus without complications: Secondary | ICD-10-CM | POA: Diagnosis not present

## 2019-12-01 DIAGNOSIS — E785 Hyperlipidemia, unspecified: Secondary | ICD-10-CM | POA: Diagnosis not present

## 2019-12-01 DIAGNOSIS — R600 Localized edema: Secondary | ICD-10-CM | POA: Diagnosis not present

## 2020-01-22 DIAGNOSIS — R600 Localized edema: Secondary | ICD-10-CM | POA: Diagnosis not present

## 2020-01-22 DIAGNOSIS — M109 Gout, unspecified: Secondary | ICD-10-CM | POA: Diagnosis not present

## 2020-01-22 DIAGNOSIS — E785 Hyperlipidemia, unspecified: Secondary | ICD-10-CM | POA: Diagnosis not present

## 2020-01-22 DIAGNOSIS — Z79899 Other long term (current) drug therapy: Secondary | ICD-10-CM | POA: Diagnosis not present

## 2020-01-22 DIAGNOSIS — E119 Type 2 diabetes mellitus without complications: Secondary | ICD-10-CM | POA: Diagnosis not present

## 2020-01-28 DIAGNOSIS — J449 Chronic obstructive pulmonary disease, unspecified: Secondary | ICD-10-CM | POA: Diagnosis not present

## 2020-01-28 DIAGNOSIS — E119 Type 2 diabetes mellitus without complications: Secondary | ICD-10-CM | POA: Diagnosis not present

## 2020-01-28 DIAGNOSIS — M109 Gout, unspecified: Secondary | ICD-10-CM | POA: Diagnosis not present

## 2020-01-28 DIAGNOSIS — Z7984 Long term (current) use of oral hypoglycemic drugs: Secondary | ICD-10-CM | POA: Diagnosis not present

## 2020-01-28 DIAGNOSIS — Z79899 Other long term (current) drug therapy: Secondary | ICD-10-CM | POA: Diagnosis not present

## 2020-01-28 DIAGNOSIS — E785 Hyperlipidemia, unspecified: Secondary | ICD-10-CM | POA: Diagnosis not present

## 2020-04-19 DIAGNOSIS — E119 Type 2 diabetes mellitus without complications: Secondary | ICD-10-CM | POA: Diagnosis not present

## 2020-04-19 DIAGNOSIS — E785 Hyperlipidemia, unspecified: Secondary | ICD-10-CM | POA: Diagnosis not present

## 2020-04-28 DIAGNOSIS — Z79899 Other long term (current) drug therapy: Secondary | ICD-10-CM | POA: Diagnosis not present

## 2020-04-28 DIAGNOSIS — Z7984 Long term (current) use of oral hypoglycemic drugs: Secondary | ICD-10-CM | POA: Diagnosis not present

## 2020-04-28 DIAGNOSIS — E1140 Type 2 diabetes mellitus with diabetic neuropathy, unspecified: Secondary | ICD-10-CM | POA: Diagnosis not present

## 2020-04-28 DIAGNOSIS — E785 Hyperlipidemia, unspecified: Secondary | ICD-10-CM | POA: Diagnosis not present

## 2020-04-28 DIAGNOSIS — M109 Gout, unspecified: Secondary | ICD-10-CM | POA: Diagnosis not present

## 2021-06-28 ENCOUNTER — Other Ambulatory Visit: Payer: Self-pay

## 2021-06-28 DIAGNOSIS — R600 Localized edema: Secondary | ICD-10-CM

## 2021-07-06 ENCOUNTER — Encounter: Payer: Self-pay | Admitting: Vascular Surgery

## 2021-07-06 ENCOUNTER — Ambulatory Visit (INDEPENDENT_AMBULATORY_CARE_PROVIDER_SITE_OTHER): Payer: HMO | Admitting: Vascular Surgery

## 2021-07-06 ENCOUNTER — Ambulatory Visit (HOSPITAL_COMMUNITY)
Admission: RE | Admit: 2021-07-06 | Discharge: 2021-07-06 | Disposition: A | Discharge status: Home / Self Care | Payer: HMO | Source: Ambulatory Visit | Attending: Vascular Surgery | Admitting: Vascular Surgery

## 2021-07-06 VITALS — BP 155/79 | HR 67 | Temp 98.3°F | Resp 16 | Ht 71.0 in | Wt 190.0 lb

## 2021-07-06 DIAGNOSIS — R600 Localized edema: Secondary | ICD-10-CM | POA: Insufficient documentation

## 2021-07-06 DIAGNOSIS — I872 Venous insufficiency (chronic) (peripheral): Secondary | ICD-10-CM | POA: Diagnosis not present

## 2021-07-06 DIAGNOSIS — I890 Lymphedema, not elsewhere classified: Secondary | ICD-10-CM | POA: Diagnosis not present

## 2021-07-06 NOTE — Progress Notes (Addendum)
ASSESSMENT & PLAN   COMBINED CHRONIC VENOUS INSUFFICIENCY AND LYMPHEDEMA: Based on his physical exam and venous duplex scan, I think the patient has combined chronic venous insufficiency and lymphedema.  We have discussed the importance of daily leg elevation and the proper positioning for this.  I have encouraged him to get fitted for a knee-high compression stocking with a gradient of 15 to 20 mmHg to start.  He may ultimately consider a 20-30 stocking.  I have encouraged him to avoid prolonged sitting and standing.  We discussed the importance of exercise specifically walking and water aerobics.  Also encouraged him to maintain a healthy weight.  He does not have any significant superficial venous reflux and therefore is not a candidate for laser ablation of the saphenous vein or small saphenous vein.  If his swelling worsens in the future he could be considered for a lymphedema pump.  REASON FOR CONSULT:    Bilateral lower extremity swelling.  The consult is requested by Royann Shivers, NP.  HPI:   James Miles is a 76 y.o. male who is referred with bilateral lower extremity swelling.  I have reviewed the records from the referring office.  The patient was seen on 03/24/2021.  The patient has a history of bilateral lower extremity swelling and was sent for vascular consultation.  On my history, the patient noted the gradual onset of swelling in both legs over the last 2-1/2 years.  The swelling has been relatively stable.  He denies any history of surgery or radiation therapy on his abdomen or groins that might be a contributing factor to lymphedema.  He denies any previous history of DVT or venous procedures.  His main complaint is swelling.  He really does not have any significant pain associated with the swelling.  He denies aching pain or heaviness in his legs.  Past Medical History:  Diagnosis Date   Back pain    COPD (chronic obstructive pulmonary disease) (HCC)    Hyperlipidemia      History reviewed. No pertinent family history.  SOCIAL HISTORY: Social History   Tobacco Use   Smoking status: Former    Years: 50.00    Types: Cigarettes    Quit date: 08/2016    Years since quitting: 4.9   Smokeless tobacco: Never  Substance Use Topics   Alcohol use: Not on file    No Known Allergies  Current Outpatient Medications  Medication Sig Dispense Refill   albuterol (VENTOLIN HFA) 108 (90 Base) MCG/ACT inhaler SMARTSIG:2 Puff(s) Via Inhaler 4 Times Daily PRN     allopurinol (ZYLOPRIM) 300 MG tablet Take 300 mg by mouth daily.     atorvastatin (LIPITOR) 20 MG tablet Take 20 mg by mouth daily.     No current facility-administered medications for this visit.    REVIEW OF SYSTEMS:  [X]  denotes positive finding, [ ]  denotes negative finding Cardiac  Comments:  Chest pain or chest pressure:    Shortness of breath upon exertion:    Short of breath when lying flat:    Irregular heart rhythm:        Vascular    Pain in calf, thigh, or hip brought on by ambulation:    Pain in feet at night that wakes you up from your sleep:     Blood clot in your veins:    Leg swelling:  x       Pulmonary    Oxygen at home:    Productive cough:  Wheezing:         Neurologic    Sudden weakness in arms or legs:     Sudden numbness in arms or legs:     Sudden onset of difficulty speaking or slurred speech:    Temporary loss of vision in one eye:     Problems with dizziness:         Gastrointestinal    Blood in stool:     Vomited blood:         Genitourinary    Burning when urinating:     Blood in urine:        Psychiatric    Major depression:         Hematologic    Bleeding problems:    Problems with blood clotting too easily:        Skin    Rashes or ulcers:        Constitutional    Fever or chills:    -  PHYSICAL EXAM:   Vitals:   07/06/21 1433  BP: (!) 155/79  Pulse: 67  Resp: 16  Temp: 98.3 F (36.8 C)  TempSrc: Temporal  SpO2: 94%   Weight: 190 lb (86.2 kg)  Height: 5\' 11"  (1.803 m)   Body mass index is 26.5 kg/m. GENERAL: The patient is a well-nourished male, in no acute distress. The vital signs are documented above. CARDIAC: There is a regular rate and rhythm.  VASCULAR: I do not detect carotid bruits. Because of his leg swelling I could not palpate pedal pulses. He does however have biphasic Doppler signals in the anterior tibial, lateral tarsal, and posterior tibial positions bilaterally. He does have some hyperpigmentation consistent with chronic venous insufficiency He has bilateral lower extremity swelling with swelling on the dorsum of his feet.     PULMONARY: There is good air exchange bilaterally without wheezing or rales. ABDOMEN: Soft and non-tender with normal pitched bowel sounds.  MUSCULOSKELETAL: There are no major deformities. NEUROLOGIC: No focal weakness or paresthesias are detected. SKIN: There are no ulcers or rashes noted. PSYCHIATRIC: The patient has a normal affect.  DATA:    VENOUS DUPLEX: I have independently interpreted his venous duplex scan today.  This was of the left lower extremity only.  There was no evidence of DVT.  There was deep venous reflux in the common femoral vein.  There was superficial venous reflux in the great saphenous vein from the proximal thigh to the mid thigh.  Diameters range from 3 to 4 mm.  There was a short segment of reflux in the small saphenous vein in the mid calf however the vein was not especially dilated here.     [facility]

## 2022-04-17 ENCOUNTER — Ambulatory Visit: Payer: HMO | Admitting: Gastroenterology

## 2022-04-17 NOTE — Progress Notes (Unsigned)
GI Office Note    Referring Provider: Alanson Puls, [name] Primary Care Physician:  [facility]  Primary Gastroenterologist: Cristopher Estimable.Rourk, MD  Chief Complaint   No chief complaint on file.   History of Present Illness   James Miles is a 77 y.o. male presenting today at the request of [name] for rectal bleeding..  Last colonoscopy in June 2007: Patient with hyperplastic polyps and recommended repeat in 10 years. No prior EGD on file.  Labs 03/23/2022: Hemoglobin 17.9, hematocrit 52.2, glucose 126, GFR 89, creatinine 0.9, normal LFTs.  Lipid panel with mildly elevated triglycerides at 221.  A1c 7.4.  Per notes from the [facility] clinic dated 04/13/2022 patient reported bright red blood on toilet tissue.  Feeling the need to have a bowel movement while sitting on the commode to urinate.  While the bubbling noise.  Denies any blood leakage from the rectum.  Stopped medication to strengthen sphincter muscle due to constipation.  Denies any pain with bowel movements.  Denies any straining to have a bowel movement.  Patient requested colonoscopy.  Was given referral for GI to discuss colonoscopy/sigmoidoscopy.  Advised to check A1c, CMP, CBC, lipid panel.  Today:  Rectal bleeding -   Bowel habits -diarrhea?  Constipation?   Current Outpatient Medications  Medication Sig Dispense Refill   albuterol (VENTOLIN HFA) 108 (90 Base) MCG/ACT inhaler SMARTSIG:2 Puff(s) Via Inhaler 4 Times Daily PRN     allopurinol (ZYLOPRIM) 300 MG tablet Take 300 mg by mouth daily.     atorvastatin (LIPITOR) 20 MG tablet Take 20 mg by mouth daily.     No current facility-administered medications for this visit.    Past Medical History:  Diagnosis Date   Back pain    COPD (chronic obstructive pulmonary disease) ([place])    Hyperlipidemia     Past Surgical History:  Procedure Laterality Date   hemorhoidectomy      No family history on file.  Allergies as of  04/18/2022   (No Known Allergies)    Social History   Socioeconomic History   Marital status: Widowed    Spouse name: Not on file   Number of children: 2   Years of education: Not on file   Highest education level: Not on file  Occupational History   Not on file  Tobacco Use   Smoking status: Former    Years: 50.00    Types: Cigarettes    Quit date: 08/2016    Years since quitting: 5.6   Smokeless tobacco: Never  Vaping Use   Vaping Use: Never used  Substance and Sexual Activity   Alcohol use: Not on file   Drug use: Never   Sexual activity: Not Currently  Other Topics Concern   Not on file  Social History Narrative   Not on file   Social Determinants of Health   Financial Resource Strain: Not on file  Food Insecurity: Not on file  Transportation Needs: Not on file  Physical Activity: Not on file  Stress: Not on file  Social Connections: Not on file  Intimate Partner Violence: Not on file     Review of Systems   Gen: Denies any fever, chills, fatigue, weight loss, lack of appetite.  CV: Denies chest pain, heart palpitations, peripheral edema, syncope.  Resp: Denies shortness of breath at rest or with exertion. Denies wheezing or cough.  GI: see HPI GU : Denies urinary burning, urinary frequency, urinary hesitancy MS: Denies  joint pain, muscle weakness, cramps, or limitation of movement.  Derm: Denies rash, itching, dry skin Psych: Denies depression, anxiety, memory loss, and confusion Heme: Denies bruising, bleeding, and enlarged lymph nodes.   Physical Exam   There were no vitals taken for this visit.  General:   Alert and oriented. Pleasant and cooperative. Well-nourished and well-developed.  Head:  Normocephalic and atraumatic. Eyes:  Without icterus, sclera clear and conjunctiva pink.  Ears:  Normal auditory acuity. Mouth:  No deformity or lesions, oral mucosa pink.  Lungs:  Clear to auscultation bilaterally. No wheezes, rales, or rhonchi. No  distress.  Heart:  S1, S2 present without murmurs appreciated.  Abdomen:  +BS, soft, non-tender and non-distended. No HSM noted. No guarding or rebound. No masses appreciated.  Rectal: *** Msk:  Symmetrical without gross deformities. Normal posture. Extremities:  Without edema. Neurologic:  Alert and  oriented x4;  grossly normal neurologically. Skin:  Intact without significant lesions or rashes. Psych:  Alert and cooperative. Normal mood and affect.   Assessment   James Miles is a 77 y.o. male with a history of COPD, HLD, gout, back pain*** presenting today for evaluation of rectal bleeding and need to schedule colonoscopy.  Rectal bleeding, history of colon polyps: Last colonoscopy in 2007 with hyperplastic polyps.  Well overdue for surveillance.   PLAN   *** Proceed with colonoscopy with propofol by Dr. Gala Romney in near future: the risks, benefits, and alternatives have been discussed with the patient in detail. The patient states understanding and desires to proceed. ASA 3 (COPD) Hold Jardiance for 3 days Hold Imodium for 5 days    Venetia Night, MSN, FNP-BC, AGACNP-BC [facility]

## 2022-04-17 NOTE — H&P (View-Only) (Signed)
GI Office Note    Referring Provider: Alanson Puls, [name] Primary Care Physician:  [facility]  Primary Gastroenterologist: Cristopher Estimable.Rourk, MD  Chief Complaint   Chief Complaint  Patient presents with   Blood In Stools    Patient here today due to seeing bright red blood. He says he sees bright red blood with every urination. Symptoms ongoing for the last six months.   History of Present Illness   James Miles is a 77 y.o. male presenting today at the request of [name] for rectal bleeding..  Last colonoscopy in June 2007: Patient with hyperplastic polyps and recommended repeat in 10 years. No prior EGD on file.  Labs 03/23/2022: Hemoglobin 17.9, hematocrit 52.2, glucose 126, GFR 89, creatinine 0.9, normal LFTs.  Lipid panel with mildly elevated triglycerides at 221.  A1c 7.4.  Per notes from the [facility] dated 04/13/2022 patient reported bright red blood on toilet tissue.  Feeling the need to have a bowel movement while sitting on the commode to urinate.  While the bubbling noise.  Denies any blood leakage from the rectum.  Stopped medication to strengthen sphincter muscle due to constipation.  Denies any pain with bowel movements.  Denies any straining to have a bowel movement.  Patient requested colonoscopy.  Was given referral for GI to discuss colonoscopy/sigmoidoscopy.  Advised to check A1c, CMP, CBC, lipid panel.  Today:  Rectal bleeding - Has been having rectal bleeding with almost every urination trip for the last 6 months. Reports he was on metformin for diabetes and had an issue with his bowel then with diarrhea. Now he has been on Jardiance and doing okay. Taking imodium 3 daily. Was not having diarrhea prior to taking metformin and since then it has continued but improved with imodium  Reviewed patients picture and appears there is bright red blood that is shiny and has mucous at times. This is occurring even when urinating and  finds it on the toilet tissue. Once in a while it drips into the commode but usually just occurs with wiping. No dizziness or lightheadedness. No rectal pain. No constipation. Has a BM usually at least once per day. No abdominal pain, nausea, vomiting. Has a good appetite, unintentional weight loss.  Denies any upper GI symptoms.  Has shortness of breath at baseline. Denies chest pain.    Current Outpatient Medications  Medication Sig Dispense Refill   albuterol (VENTOLIN HFA) 108 (90 Base) MCG/ACT inhaler SMARTSIG:2 Puff(s) Via Inhaler 4 Times Daily PRN     allopurinol (ZYLOPRIM) 300 MG tablet Take 300 mg by mouth daily.     atorvastatin (LIPITOR) 20 MG tablet Take 20 mg by mouth daily.     fenofibrate 54 MG tablet Take 54 mg by mouth daily.     JARDIANCE 10 MG TABS tablet Take 20 mg by mouth daily.     lisinopril (ZESTRIL) 20 MG tablet Take 20 mg by mouth daily.     loperamide (IMODIUM) 2 MG capsule Take 2 mg by mouth as needed.     OVER THE COUNTER MEDICATION Probiotic 5 capsules per day  Melatonin QHS     No current facility-administered medications for this visit.    Past Medical History:  Diagnosis Date   Back pain    COPD (chronic obstructive pulmonary disease) ([place])    Hyperlipidemia     Past Surgical History:  Procedure Laterality Date   hemorhoidectomy      History reviewed.  No pertinent family history.  Allergies as of 04/18/2022   (No Known Allergies)    Social History   Socioeconomic History   Marital status: Widowed    Spouse name: Not on file   Number of children: 2   Years of education: Not on file   Highest education level: Not on file  Occupational History   Not on file  Tobacco Use   Smoking status: Former    Years: 50.00    Types: Cigarettes    Quit date: 08/2016    Years since quitting: 5.7   Smokeless tobacco: Never  Vaping Use   Vaping Use: Never used  Substance and Sexual Activity   Alcohol use: Not on file   Drug use: Never    Sexual activity: Not Currently  Other Topics Concern   Not on file  Social History Narrative   Not on file   Social Determinants of Health   Financial Resource Strain: Not on file  Food Insecurity: Not on file  Transportation Needs: Not on file  Physical Activity: Not on file  Stress: Not on file  Social Connections: Not on file  Intimate Partner Violence: Not on file     Review of Systems   Gen: Denies any fever, chills, fatigue, weight loss, lack of appetite.  CV: Denies chest pain, heart palpitations, peripheral edema, syncope.  Resp: Denies shortness of breath at rest or with exertion. Denies wheezing or cough.  GI: see HPI GU : Denies urinary burning, urinary frequency, urinary hesitancy MS: Denies joint pain, muscle weakness, cramps, or limitation of movement.  Derm: Denies rash, itching, dry skin Psych: Denies depression, anxiety, memory loss, and confusion Heme: Denies bruising, bleeding, and enlarged lymph nodes.   Physical Exam   BP (!) 142/76 (BP Location: Left Arm, Patient Position: Sitting, Cuff Size: Small)   Pulse 68   Temp (!) 97.5 F (36.4 C) (Temporal)   Ht 5\' 11"  (1.803 m)   Wt 181 lb 4.8 oz (82.2 kg)   BMI 25.29 kg/m   General:   Alert and oriented. Pleasant and cooperative. Well-nourished and well-developed.  Head:  Normocephalic and atraumatic. Eyes:  Without icterus, sclera clear and conjunctiva pink.  Ears:  Normal auditory acuity. Mouth:  No deformity or lesions, oral mucosa pink.  Lungs:  Clear to auscultation bilaterally. No wheezes, rales, or rhonchi. No distress.  Heart:  S1, S2 present without murmurs appreciated.  Abdomen:  +BS, soft, non-tender and non-distended. No HSM noted. No guarding or rebound. No masses appreciated.  Rectal: good internal sphincter tone. Mild laxity in external tone. Small amounf of hemorrhoid tissue felt on exam. No overt blood present.  Msk:  Symmetrical without gross deformities. Normal  posture. Extremities:  Without edema. Neurologic:  Alert and  oriented x4;  grossly normal neurologically. Skin:  Intact without significant lesions or rashes. Psych:  Alert and cooperative. Normal mood and affect.   Assessment   James Miles is a 77 y.o. male with a history of COPD, HLD, gout, back pain presenting today for evaluation of rectal bleeding and need to schedule colonoscopy.  Rectal bleeding, diarrhea, history of colon polyps: Last colonoscopy in 2007 with hyperplastic polyps.  Well overdue for surveillance. Negative Cologuard in September 2020.  No evidence of anemia.  No overt bleeding on rectal exam today.  Does have some mild laxity to external sphincter but has good internal rectal tone.  Has been experiencing diarrhea as well as some rectal bleeding for about 6 months.  Did report diarrhea started 20 began taking metformin but even after stopping medication his diarrhea continued.  He denies any abdominal pain, fever, chills.  Currently taking Imodium 3 times daily and only having about 1 bowel movement per day.  Rectal bleeding usually only occurs on the toilet tissue, rarely within the toilet bowl.  Suspect benign anorectal source with hemorrhoids given some mild evidence today on exam however given he has not had a colonoscopy since 2007 we will proceed for further evaluation.  Advised the patient to stop using Imodium for few days and to assess for return of diarrhea.  If his symptoms return advised him he may resume taking Imodium and that we should proceed with serologic and stool testing for further evaluation of diarrhea.  Patient is in agreement.   PLAN   Labs offered. Patient prefers to have colonoscopy and further assess with stool studies later if needed.  Proceed with colonoscopy with propofol by Dr. Gala Romney in near future: the risks, benefits, and alternatives have been discussed with the patient in detail. The patient states understanding and desires to proceed.  ASA 3 (COPD) Hold Jardiance for 3 days  Hold imodium for 5 days prior.  Hold imodium for a few days and assess to see if looser stools return. If looser stools return you may rsume imodium 3 times daily and will send for fecal elastase, fecal calprotectin, CMP, TSH   Venetia Night, MSN, FNP-BC, AGACNP-BC [facility]

## 2022-04-18 ENCOUNTER — Encounter: Payer: Self-pay | Admitting: Gastroenterology

## 2022-04-18 ENCOUNTER — Ambulatory Visit (INDEPENDENT_AMBULATORY_CARE_PROVIDER_SITE_OTHER): Payer: PPO | Admitting: Gastroenterology

## 2022-04-18 ENCOUNTER — Telehealth (INDEPENDENT_AMBULATORY_CARE_PROVIDER_SITE_OTHER): Payer: Self-pay | Admitting: Internal Medicine

## 2022-04-18 VITALS — BP 142/76 | HR 68 | Temp 97.5°F | Ht 71.0 in | Wt 181.3 lb

## 2022-04-18 DIAGNOSIS — Z86010 Personal history of colonic polyps: Secondary | ICD-10-CM

## 2022-04-18 DIAGNOSIS — K625 Hemorrhage of anus and rectum: Secondary | ICD-10-CM

## 2022-04-18 DIAGNOSIS — R197 Diarrhea, unspecified: Secondary | ICD-10-CM

## 2022-04-18 MED ORDER — PEG 3350-KCL-NA BICARB-NACL 420 G PO SOLR: 4000.0000 mL | Freq: Once | ORAL | 0 refills | Status: AC

## 2022-04-18 NOTE — Telephone Encounter (Signed)
Pt returned call and schedule TCS for 04/26/22. Pt will need to pick up instructions-will let him know when we call with pre op information. Prep sent to pharmacy.

## 2022-04-18 NOTE — Telephone Encounter (Signed)
Left message for pt to return call to schedule TCS ASA 3 with Dr.Rourk.

## 2022-04-18 NOTE — Patient Instructions (Addendum)
We are scheduling you for a colonoscopy in the near future with Dr. Gala Romney to further evaluate your diarrhea and your rectal bleeding. You will need to hold your Jardiance for 3 days and hold imodium for 5 days prior.   I believe you should hold imodium and see if diarrhea returns. If you are having more than 2-3 looser stools per day you may begin back taking the imodium and let me know and we can order testing regarding finding cause of diarrhea. You wll need to hold as states above for colonoscopy.   It was a pleasure to see you today. I want to create trusting relationships with patients. If you receive a survey regarding your visit,  I greatly appreciate you taking time to fill this out on paper or through your MyChart. I value your feedback.  Venetia Night, MSN, FNP-BC, AGACNP-BC [facility]

## 2022-04-19 NOTE — Telephone Encounter (Signed)
Pt contacted with pre op appt. Pre op is at [facility] on 04/21/22 at 10:00. Pt also made aware to come by office and pick up instructions.

## 2022-04-19 NOTE — Patient Instructions (Signed)
James Miles  04/19/2022     '@PREFPERIOPPHARMACY'$ @   Your procedure is scheduled on  04/26/2022.   Report to [facility] at  [place].M.   Call this number if you have problems the morning of surgery:  [phone]  If you experience any cold or flu symptoms such as cough, fever, chills, shortness of breath, etc. between now and your scheduled surgery, please notify us at the above number.   Remember:  Follow the diet and prep instructions given to you by the office.      Your last dose of immodium should be on 04/20/2022.      Your last dose of jardiance should be 04/22/2022.     DO NOT take any medications for diabetes the morning of your procedure.      Take these medicines the morning of surgery with A SIP OF WATER                                     allopurinol.    Do not wear jewelry, make-up or nail polish.  Do not wear lotions, powders, or perfumes, or deodorant.  Do not shave 48 hours prior to surgery.  Men may shave face and neck.  Do not bring valuables to the hospital.  [facility] is not responsible for any belongings or valuables.  Contacts, dentures or bridgework may not be worn into surgery.  Leave your suitcase in the car.  After surgery it may be brought to your room.  For patients admitted to the hospital, discharge time will be determined by your treatment team.  Patients discharged the day of surgery will not be allowed to drive home and must have someone with them for 24 hours.    Special instructions:   DO NOT smoke tobacco or vape for 24 hours before your procedure.  Please read over the following fact sheets that you were given. Anesthesia Post-op Instructions and Care and Recovery After Surgery      Colonoscopy, Adult, Care After The following information offers guidance on how to care for yourself after your procedure. Your health care provider may also give you more specific instructions. If you have problems or questions,  contact your health care provider. What can I expect after the procedure? After the procedure, it is common to have: A small amount of blood in your stool for 24 hours after the procedure. Some gas. Mild cramping or bloating of your abdomen. Follow these instructions at home: Eating and drinking  Drink enough fluid to keep your urine pale yellow. Follow instructions from your health care provider about eating or drinking restrictions. Resume your normal diet as told by your health care provider. Avoid heavy or fried foods that are hard to digest. Activity Rest as told by your health care provider. Avoid sitting for a long time without moving. Get up to take short walks every 1-2 hours. This is important to improve blood flow and breathing. Ask for help if you feel weak or unsteady. Return to your normal activities as told by your health care provider. Ask your health care provider what activities are safe for you. Managing cramping and bloating  Try walking around when you have cramps or feel bloated. If directed, apply heat to your abdomen as told by your health care provider. Use the heat source that your health care provider recommends,  such as a moist heat pack or a heating pad. Place a towel between your skin and the heat source. Leave the heat on for 20-30 minutes. Remove the heat if your skin turns bright red. This is especially important if you are unable to feel pain, heat, or cold. You have a greater risk of getting burned. General instructions If you were given a sedative during the procedure, it can affect you for several hours. Do not drive or operate machinery until your health care provider says that it is safe. For the first 24 hours after the procedure: Do not sign important documents. Do not drink alcohol. Do your regular daily activities at a slower pace than normal. Eat soft foods that are easy to digest. Take over-the-counter and prescription medicines only as told  by your health care provider. Keep all follow-up visits. This is important. Contact a health care provider if: You have blood in your stool 2-3 days after the procedure. Get help right away if: You have more than a small spotting of blood in your stool. You have large blood clots in your stool. You have swelling of your abdomen. You have nausea or vomiting. You have a fever. You have increasing pain in your abdomen that is not relieved with medicine. These symptoms may be an emergency. Get help right away. Call 911. Do not wait to see if the symptoms will go away. Do not drive yourself to the hospital. Summary After the procedure, it is common to have a small amount of blood in your stool. You may also have mild cramping and bloating of your abdomen. If you were given a sedative during the procedure, it can affect you for several hours. Do not drive or operate machinery until your health care provider says that it is safe. Get help right away if you have a lot of blood in your stool, nausea or vomiting, a fever, or increased pain in your abdomen. This information is not intended to replace advice given to you by your health care provider. Make sure you discuss any questions you have with your health care provider. Document Revised: 09/15/2020 Document Reviewed: 09/15/2020 Elsevier Patient Education  [place] After The following information offers guidance on how to care for yourself after your procedure. Your health care provider may also give you more specific instructions. If you have problems or questions, contact your health care provider. What can I expect after the procedure? After the procedure, it is common to have: Tiredness. Little or no memory about what happened during or after the procedure. Impaired judgment when it comes to making decisions. Nausea or vomiting. Some trouble with balance. Follow these instructions at home: For  the time period you were told by your health care provider:  Rest. Do not participate in activities where you could fall or become injured. Do not drive or use machinery. Do not drink alcohol. Do not take sleeping pills or medicines that cause drowsiness. Do not make important decisions or sign legal documents. Do not take care of children on your own. Medicines Take over-the-counter and prescription medicines only as told by your health care provider. If you were prescribed antibiotics, take them as told by your health care provider. Do not stop using the antibiotic even if you start to feel better. Eating and drinking Follow instructions from your health care provider about what you may eat and drink. Drink enough fluid to keep your urine pale yellow. If you vomit:  Drink clear fluids slowly and in small amounts as you are able. Clear fluids include water, ice chips, low-calorie sports drinks, and fruit juice that has water added to it (diluted fruit juice). Eat light and bland foods in small amounts as you are able. These foods include bananas, applesauce, rice, lean meats, toast, and crackers. General instructions  Have a responsible adult stay with you for the time you are told. It is important to have someone help care for you until you are awake and alert. If you have sleep apnea, surgery and some medicines can increase your risk for breathing problems. Follow instructions from your health care provider about wearing your sleep device: When you are sleeping. This includes during daytime naps. While taking prescription pain medicines, sleeping medicines, or medicines that make you drowsy. Do not use any products that contain nicotine or tobacco. These products include cigarettes, chewing tobacco, and vaping devices, such as e-cigarettes. If you need help quitting, ask your health care provider. Contact a health care provider if: You feel nauseous or vomit every time you eat or  drink. You feel light-headed. You are still sleepy or having trouble with balance after 24 hours. You get a rash. You have a fever. You have redness or swelling around the IV site. Get help right away if: You have trouble breathing. You have new confusion after you get home. These symptoms may be an emergency. Get help right away. Call 911. Do not wait to see if the symptoms will go away. Do not drive yourself to the hospital. This information is not intended to replace advice given to you by your health care provider. Make sure you discuss any questions you have with your health care provider. Document Revised: 06/20/2021 Document Reviewed: 06/20/2021 Elsevier Patient Education  [place].

## 2022-04-21 ENCOUNTER — Encounter (HOSPITAL_COMMUNITY): Payer: Self-pay

## 2022-04-21 ENCOUNTER — Encounter (HOSPITAL_COMMUNITY)
Admission: RE | Admit: 2022-04-21 | Discharge: 2022-04-21 | Disposition: A | Discharge status: Home / Self Care | Payer: PPO | Source: Ambulatory Visit | Attending: Internal Medicine | Admitting: Internal Medicine

## 2022-04-21 VITALS — BP 133/71 | HR 66 | Temp 97.5°F | Resp 18 | Ht 71.0 in | Wt 181.3 lb

## 2022-04-21 DIAGNOSIS — I491 Atrial premature depolarization: Secondary | ICD-10-CM | POA: Diagnosis not present

## 2022-04-21 DIAGNOSIS — Z01818 Encounter for other preprocedural examination: Secondary | ICD-10-CM | POA: Insufficient documentation

## 2022-04-21 DIAGNOSIS — E119 Type 2 diabetes mellitus without complications: Secondary | ICD-10-CM | POA: Insufficient documentation

## 2022-04-21 DIAGNOSIS — I493 Ventricular premature depolarization: Secondary | ICD-10-CM | POA: Insufficient documentation

## 2022-04-21 HISTORY — DX: Type 2 diabetes mellitus without complications: E11.9

## 2022-04-21 LAB — BASIC METABOLIC PANEL
Anion gap: 11 (ref 5–15)
BUN: 13 mg/dL (ref 8–23)
CO2: 24 mmol/L (ref 22–32)
Calcium: 9.1 mg/dL (ref 8.9–10.3)
Chloride: 105 mmol/L (ref 98–111)
Creatinine, Ser: 0.83 mg/dL (ref 0.61–1.24)
GFR, Estimated: >60 mL/min (ref >60)
Glucose, Bld: 142 mg/dL — ABNORMAL HIGH (ref 70–99)
Potassium: 3.5 mmol/L (ref 3.5–5.1)
Sodium: 140 mmol/L (ref 135–145)

## 2022-04-26 ENCOUNTER — Ambulatory Visit (HOSPITAL_COMMUNITY): Payer: PPO | Admitting: Anesthesiology

## 2022-04-26 ENCOUNTER — Ambulatory Visit (HOSPITAL_COMMUNITY)
Admission: RE | Admit: 2022-04-26 | Discharge: 2022-04-26 | Disposition: A | Discharge status: Home / Self Care | Payer: PPO | Attending: Internal Medicine | Admitting: Internal Medicine

## 2022-04-26 ENCOUNTER — Other Ambulatory Visit: Payer: Self-pay

## 2022-04-26 ENCOUNTER — Telehealth (INDEPENDENT_AMBULATORY_CARE_PROVIDER_SITE_OTHER): Payer: Self-pay | Admitting: *Deleted

## 2022-04-26 ENCOUNTER — Encounter (HOSPITAL_COMMUNITY)
Admission: RE | Disposition: A | Discharge status: Home / Self Care | Payer: Self-pay | Source: Home / Self Care | Attending: Internal Medicine

## 2022-04-26 ENCOUNTER — Ambulatory Visit (HOSPITAL_BASED_OUTPATIENT_CLINIC_OR_DEPARTMENT_OTHER): Payer: PPO | Admitting: Anesthesiology

## 2022-04-26 ENCOUNTER — Encounter (HOSPITAL_COMMUNITY): Payer: Self-pay | Admitting: Internal Medicine

## 2022-04-26 DIAGNOSIS — Z87891 Personal history of nicotine dependence: Secondary | ICD-10-CM | POA: Insufficient documentation

## 2022-04-26 DIAGNOSIS — K921 Melena: Secondary | ICD-10-CM | POA: Diagnosis not present

## 2022-04-26 DIAGNOSIS — Z7984 Long term (current) use of oral hypoglycemic drugs: Secondary | ICD-10-CM | POA: Diagnosis not present

## 2022-04-26 DIAGNOSIS — C20 Malignant neoplasm of rectum: Secondary | ICD-10-CM | POA: Diagnosis not present

## 2022-04-26 DIAGNOSIS — K5730 Diverticulosis of large intestine without perforation or abscess without bleeding: Secondary | ICD-10-CM | POA: Insufficient documentation

## 2022-04-26 DIAGNOSIS — K6289 Other specified diseases of anus and rectum: Secondary | ICD-10-CM

## 2022-04-26 DIAGNOSIS — K5731 Diverticulosis of large intestine without perforation or abscess with bleeding: Secondary | ICD-10-CM | POA: Diagnosis not present

## 2022-04-26 DIAGNOSIS — Z86010 Personal history of colonic polyps: Secondary | ICD-10-CM

## 2022-04-26 DIAGNOSIS — E119 Type 2 diabetes mellitus without complications: Secondary | ICD-10-CM | POA: Insufficient documentation

## 2022-04-26 DIAGNOSIS — K635 Polyp of colon: Secondary | ICD-10-CM

## 2022-04-26 DIAGNOSIS — J449 Chronic obstructive pulmonary disease, unspecified: Secondary | ICD-10-CM | POA: Insufficient documentation

## 2022-04-26 DIAGNOSIS — K625 Hemorrhage of anus and rectum: Secondary | ICD-10-CM

## 2022-04-26 DIAGNOSIS — R197 Diarrhea, unspecified: Secondary | ICD-10-CM

## 2022-04-26 HISTORY — PX: COLONOSCOPY WITH PROPOFOL: SHX5780

## 2022-04-26 HISTORY — PX: POLYPECTOMY: SHX5525

## 2022-04-26 HISTORY — PX: BIOPSY: SHX5522

## 2022-04-26 LAB — CBC
HCT: 48.4 % (ref 39.0–52.0)
Hemoglobin: 15.8 g/dL (ref 13.0–17.0)
MCH: 30 pg (ref 26.0–34.0)
MCHC: 32.6 g/dL (ref 30.0–36.0)
MCV: 91.8 fL (ref 80.0–100.0)
Platelets: 201 10*3/uL (ref 150–400)
RBC: 5.27 MIL/uL (ref 4.22–5.81)
RDW: 12.9 % (ref 11.5–15.5)
WBC: 8.9 10*3/uL (ref 4.0–10.5)
nRBC: 0 % (ref 0.0–0.2)

## 2022-04-26 LAB — COMPREHENSIVE METABOLIC PANEL
ALT: 26 U/L (ref 0–44)
AST: 21 U/L (ref 15–41)
Albumin: 3.9 g/dL (ref 3.5–5.0)
Alkaline Phosphatase: 69 U/L (ref 38–126)
Anion gap: 7 (ref 5–15)
BUN: 7 mg/dL — ABNORMAL LOW (ref 8–23)
CO2: 26 mmol/L (ref 22–32)
Calcium: 8.4 mg/dL — ABNORMAL LOW (ref 8.9–10.3)
Chloride: 102 mmol/L (ref 98–111)
Creatinine, Ser: 0.81 mg/dL (ref 0.61–1.24)
GFR, Estimated: >60 mL/min (ref >60)
Glucose, Bld: 177 mg/dL — ABNORMAL HIGH (ref 70–99)
Potassium: 3.5 mmol/L (ref 3.5–5.1)
Sodium: 135 mmol/L (ref 135–145)
Total Bilirubin: 0.5 mg/dL (ref 0.3–1.2)
Total Protein: 7 g/dL (ref 6.5–8.1)

## 2022-04-26 LAB — GLUCOSE, CAPILLARY: Glucose-Capillary: 128 mg/dL — ABNORMAL HIGH (ref 70–99)

## 2022-04-26 SURGERY — COLONOSCOPY WITH PROPOFOL
Anesthesia: General

## 2022-04-26 MED ORDER — LACTATED RINGERS IV SOLN: INTRAVENOUS | Status: DC

## 2022-04-26 MED ORDER — PROPOFOL 10 MG/ML IV BOLUS
INTRAVENOUS | Status: DC | PRN
  Administered 2022-04-26: 80 mg via INTRAVENOUS
  Administered 2022-04-26: 50 mg via INTRAVENOUS

## 2022-04-26 MED ORDER — LIDOCAINE HCL 1 % IJ SOLN
INTRAMUSCULAR | Status: DC | PRN
  Administered 2022-04-26: 50 mg via INTRADERMAL

## 2022-04-26 MED ORDER — PHENYLEPHRINE HCL (PRESSORS) 10 MG/ML IV SOLN
INTRAVENOUS | Status: DC | PRN
  Administered 2022-04-26 (×2): 80 ug via INTRAVENOUS

## 2022-04-26 MED ORDER — PROPOFOL 500 MG/50ML IV EMUL
INTRAVENOUS | Status: DC | PRN
  Administered 2022-04-26: 150 ug/kg/min via INTRAVENOUS

## 2022-04-26 NOTE — Discharge Instructions (Signed)
  Colonoscopy Discharge Instructions  Read the instructions outlined below and refer to this sheet in the next few weeks. These discharge instructions provide you with general information on caring for yourself after you leave the hospital. Your doctor may also give you specific instructions. While your treatment has been planned according to the most current medical practices available, unavoidable complications occasionally occur. If you have any problems or questions after discharge, call Dr. Gala Romney at [phone]. ACTIVITY You may resume your regular activity, but move at a slower pace for the next 24 hours.  Take frequent rest periods for the next 24 hours.  Walking will help get rid of the air and reduce the bloated feeling in your belly (abdomen).  No driving for 24 hours (because of the medicine (anesthesia) used during the test).   Do not sign any important legal documents or operate any machinery for 24 hours (because of the anesthesia used during the test).  NUTRITION Drink plenty of fluids.  You may resume your normal diet as instructed by your doctor.  Begin with a light meal and progress to your normal diet. Heavy or fried foods are harder to digest and may make you feel sick to your stomach (nauseated).  Avoid alcoholic beverages for 24 hours or as instructed.  MEDICATIONS You may resume your normal medications unless your doctor tells you otherwise.  WHAT YOU CAN EXPECT TODAY Some feelings of bloating in the abdomen.  Passage of more gas than usual.  Spotting of blood in your stool or on the toilet paper.  IF YOU HAD POLYPS REMOVED DURING THE COLONOSCOPY: No aspirin products for 7 days or as instructed.  No alcohol for 7 days or as instructed.  Eat a soft diet for the next 24 hours.  FINDING OUT THE RESULTS OF YOUR TEST Not all test results are available during your visit. If your test results are not back during the visit, make an appointment with your caregiver to find out the  results. Do not assume everything is normal if you have not heard from your caregiver or the medical facility. It is important for you to follow up on all of your test results.  SEEK IMMEDIATE MEDICAL ATTENTION IF: You have more than a spotting of blood in your stool.  Your belly is swollen (abdominal distention).  You are nauseated or vomiting.  You have a temperature over 101.  You have abdominal pain or discomfort that is severe or gets worse throughout the day.     You had 1 small polyp on the right side of your colon which was removed.   you have diverticulosis.  You have a large mass in your rectum.  It was biopsied.  This is the cause of your rectal bleeding.  There may be cancer in this mass.    I recommend you undergo a pelvic, abdominal and chest CT  with and without IV contrast   CBC, CEA, CHEM 12 today   further recommendations to follow once I have the above results back for review   at patient request, I called Rosine Abe at [phone]-call rolled to voicemail "not set up" yet"

## 2022-04-26 NOTE — Op Note (Signed)
[facility] Patient Name: James Miles Procedure Date: 04/26/2022 11:36 AM MRN: [id number] Date of Birth: [birth year] Attending MD: Norvel Richards , MD, [id number] CSN: [id number] Age: 77 Admit Type: Outpatient Procedure:                Colonoscopy Indications:              Hematochezia Providers:                Norvel Richards, MD, Janeece Riggers, RN, Raphael Gibney, Technician Referring MD:              Medicines:                Propofol per Anesthesia Complications:            No immediate complications. Estimated Blood Loss:     Estimated blood loss was minimal. Procedure:                Pre-Anesthesia Assessment:                           - Prior to the procedure, a History and Physical                            was performed, and patient medications and                            allergies were reviewed. The patient's tolerance of                            previous anesthesia was also reviewed. The risks                            and benefits of the procedure and the sedation                            options and risks were discussed with the patient.                            All questions were answered, and informed consent                            was obtained. Prior Anticoagulants: The patient has                            taken no anticoagulant or antiplatelet agents. ASA                            Grade Assessment: III - A patient with severe                            systemic disease. After reviewing the risks and  benefits, the patient was deemed in satisfactory                            condition to undergo the procedure.                           After obtaining informed consent, the colonoscope                            was passed under direct vision. Throughout the                            procedure, the patient's blood pressure, pulse, and                            oxygen saturations  were monitored continuously. The                            [id number]) scope was introduced through the                            anus and advanced to the the cecum, identified by                            appendiceal orifice and ileocecal valve. The                            colonoscopy was performed without difficulty. The                            patient tolerated the procedure well. The quality                            of the bowel preparation was adequate. The                            ileocecal valve, appendiceal orifice, and rectum                            were photographed. The colonoscopy was performed                            without difficulty. The entire colon was well                            visualized. Scope In: 11:52:41 AM Scope Out: 12:09:53 PM Total Procedure Duration: 0 hours 17 minutes 12 seconds  Findings:      The perianal exam was normal. There was a large cauliflower mass       palpated in the distal rectum on DRE. It had firm and soft components.      Scattered medium-mouthed diverticula were found in the descending colon.       Single 5 mm polyp in the ascending segment.      In the rectum, there was an exophytic semilunar neoplastic  appearing       process beginning at the anal verge and extending proximally about 6 cm.       Please see photos on?"face and retroflexed views of the rectum. Multiple       biopsies of this lesion were taken. Impression:               - Diverticulosis in the descending colon. Ascending                            colon polyp?"status post cold snare removal                           -Bulky rectal neoplasm as described above status                            post biopsy. Moderate Sedation:      Moderate (conscious) sedation was personally administered by an       anesthesia professional. The following parameters were monitored: oxygen       saturation, heart rate, blood pressure, respiratory rate, EKG,  adequacy       of pulmonary ventilation, and response to care. Recommendation:           - Patient has a contact number available for                            emergencies. The signs and symptoms of potential                            delayed complications were discussed with the                            patient. Return to normal activities tomorrow.                            Written discharge instructions were provided to the                            patient.                           - Advance diet as tolerated. Follow-up on                            pathology. Pelvic, abdomen and chest CT with and                            without contrast.. CEA, CHEM 12 and CBC today.                           -Further recommendations to follow once data                            returns for review. Procedure Code(s):        --- Professional ---  45378, Colonoscopy, flexible; diagnostic, including                            collection of specimen(s) by brushing or washing,                            when performed (separate procedure) Diagnosis Code(s):        --- Professional ---                           K92.1, Melena (includes Hematochezia)                           K57.30, Diverticulosis of large intestine without                            perforation or abscess without bleeding CPT copyright 2022 [employer]. All rights reserved. The codes documented in this report are preliminary and upon coder review may  be revised to meet current compliance requirements. Cristopher Estimable. Alexiz Cothran, MD Norvel Richards, MD 04/26/2022 12:21:28 PM This report has been signed electronically. Number of Addenda: 0

## 2022-04-26 NOTE — Anesthesia Preprocedure Evaluation (Addendum)
Anesthesia Evaluation  Patient identified by MRN, date of birth, ID band Patient awake    Reviewed: Allergy & Precautions, H&P , NPO status , Patient's Chart, lab work & pertinent test results  Airway Mallampati: II  TM Distance: >3 FB Neck ROM: Full    Dental no notable dental hx. (+) Dental Advisory Given, Caps   Pulmonary shortness of breath and with exertion, COPD,  COPD inhaler, former smoker   Pulmonary exam normal breath sounds clear to auscultation       Cardiovascular negative cardio ROS Normal cardiovascular exam Rhythm:Regular Rate:Normal     Neuro/Psych negative neurological ROS  negative psych ROS   GI/Hepatic negative GI ROS, Neg liver ROS,,,  Endo/Other  diabetes, Well Controlled, Type 2, Oral Hypoglycemic Agents    Renal/GU negative Renal ROS  negative genitourinary   Musculoskeletal negative musculoskeletal ROS (+)    Abdominal   Peds negative pediatric ROS (+)  Hematology negative hematology ROS (+)   Anesthesia Other Findings   Reproductive/Obstetrics negative OB ROS                             Anesthesia Physical Anesthesia Plan  ASA: 2  Anesthesia Plan: General   Post-op Pain Management: Minimal or no pain anticipated   Induction: Intravenous  PONV Risk Score and Plan:   Airway Management Planned: Natural Airway and Nasal Cannula  Additional Equipment:   Intra-op Plan:   Post-operative Plan:   Informed Consent: I have reviewed the patients History and Physical, chart, labs and discussed the procedure including the risks, benefits and alternatives for the proposed anesthesia with the patient or authorized representative who has indicated his/her understanding and acceptance.     Dental advisory given  Plan Discussed with: CRNA  Anesthesia Plan Comments:        Anesthesia Quick Evaluation

## 2022-04-26 NOTE — Telephone Encounter (Signed)
Per Dr. Gala Romney patient needs:  pelvic, abdominal and chest CT with and without IV contrast, dx rectal mass. Per radiology it needs to be just with contrast. Made Dr. Gala Romney aware and he was fine with this.  CT scheduled for 3/25, arrival 7:30am to drink oral contrast, npo 4 hrs.  Called pt and he is aware of appts details. He voiced understanding.

## 2022-04-26 NOTE — Anesthesia Postprocedure Evaluation (Signed)
Anesthesia Post Note  Patient: SCHUYLER MCLAWHORN  Procedure(s) Performed: COLONOSCOPY WITH PROPOFOL POLYPECTOMY BIOPSY  Patient location during evaluation: Short Stay Anesthesia Type: General Level of consciousness: awake Pain management: pain level controlled Vital Signs Assessment: post-procedure vital signs reviewed and stable Respiratory status: spontaneous breathing Cardiovascular status: blood pressure returned to baseline and stable Postop Assessment: no apparent nausea or vomiting Anesthetic complications: no   No notable events documented.   Last Vitals:  Vitals:   04/26/22 1030 04/26/22 1217  BP: (!) 150/78 (!) 76/47  Pulse: 60 69  Resp: 16 16  Temp: 36.6 C 36.8 C  SpO2: 98% 93%    Last Pain:  Vitals:   04/26/22 1217  TempSrc: Axillary  PainSc:                  Tressie Stalker

## 2022-04-26 NOTE — Interval H&P Note (Signed)
History and Physical Interval Note:  04/26/2022 11:39 AM  James Miles  has presented today for surgery, with the diagnosis of HISTORY COLON POLYPS, DIARRHEA, RECTAL BLEEDING.  The various methods of treatment have been discussed with the patient and family. After consideration of risks, benefits and other options for treatment, the patient has consented to  Procedure(s) with comments: COLONOSCOPY WITH PROPOFOL (N/A) - 10:00am; ASA 3 as a surgical intervention.  The patient's history has been reviewed, patient examined, no change in status, stable for surgery.  I have reviewed the patient's chart and labs.  Questions were answered to the patient's satisfaction.     James Miles     no change.  Diagnostic colonoscopy today per plan.   patient endorses rectal bleeding.  Denies any diarrhea since he came off of metformin.   I will offer the patient a diagnostic colonoscopy today.  The risks, benefits, limitations, alternatives and imponderables have been reviewed with the patient. Questions have been answered. All parties are agreeable.

## 2022-04-26 NOTE — Transfer of Care (Signed)
Immediate Anesthesia Transfer of Care Note  Patient: James Miles  Procedure(s) Performed: COLONOSCOPY WITH PROPOFOL POLYPECTOMY BIOPSY  Patient Location: Short Stay  Anesthesia Type:General  Level of Consciousness: awake  Airway & Oxygen Therapy: Patient Spontanous Breathing  Post-op Assessment: Report given to RN  Post vital signs: Reviewed and stable  Last Vitals:  Vitals Value Taken Time  BP 76/47 04/26/22 1217  Temp 36.8 C 04/26/22 1217  Pulse 69 04/26/22 1217  Resp 16 04/26/22 1217  SpO2 93 % 04/26/22 1217    Last Pain:  Vitals:   04/26/22 1217  TempSrc: Axillary  PainSc:          Complications: No notable events documented.

## 2022-04-28 LAB — SURGICAL PATHOLOGY

## 2022-04-28 LAB — CEA: CEA: 2.6 ng/mL (ref 0.0–4.7)

## 2022-05-01 ENCOUNTER — Ambulatory Visit (HOSPITAL_COMMUNITY)
Admission: RE | Admit: 2022-05-01 | Discharge: 2022-05-01 | Disposition: A | Discharge status: Home / Self Care | Payer: PPO | Source: Ambulatory Visit | Attending: Internal Medicine | Admitting: Internal Medicine

## 2022-05-01 ENCOUNTER — Encounter (HOSPITAL_COMMUNITY): Payer: Self-pay | Admitting: Radiology

## 2022-05-01 ENCOUNTER — Other Ambulatory Visit: Payer: Self-pay

## 2022-05-01 ENCOUNTER — Other Ambulatory Visit (INDEPENDENT_AMBULATORY_CARE_PROVIDER_SITE_OTHER): Payer: Self-pay | Admitting: *Deleted

## 2022-05-01 ENCOUNTER — Ambulatory Visit: Payer: HMO | Admitting: Gastroenterology

## 2022-05-01 DIAGNOSIS — R928 Other abnormal and inconclusive findings on diagnostic imaging of breast: Secondary | ICD-10-CM | POA: Diagnosis not present

## 2022-05-01 DIAGNOSIS — K5730 Diverticulosis of large intestine without perforation or abscess without bleeding: Secondary | ICD-10-CM | POA: Diagnosis not present

## 2022-05-01 DIAGNOSIS — I723 Aneurysm of iliac artery: Secondary | ICD-10-CM | POA: Diagnosis not present

## 2022-05-01 DIAGNOSIS — K760 Fatty (change of) liver, not elsewhere classified: Secondary | ICD-10-CM | POA: Diagnosis not present

## 2022-05-01 DIAGNOSIS — I2510 Atherosclerotic heart disease of native coronary artery without angina pectoris: Secondary | ICD-10-CM | POA: Diagnosis not present

## 2022-05-01 DIAGNOSIS — K449 Diaphragmatic hernia without obstruction or gangrene: Secondary | ICD-10-CM | POA: Diagnosis not present

## 2022-05-01 DIAGNOSIS — N400 Enlarged prostate without lower urinary tract symptoms: Secondary | ICD-10-CM | POA: Diagnosis not present

## 2022-05-01 DIAGNOSIS — N329 Bladder disorder, unspecified: Secondary | ICD-10-CM | POA: Diagnosis not present

## 2022-05-01 DIAGNOSIS — C20 Malignant neoplasm of rectum: Secondary | ICD-10-CM

## 2022-05-01 DIAGNOSIS — R911 Solitary pulmonary nodule: Secondary | ICD-10-CM | POA: Diagnosis not present

## 2022-05-01 DIAGNOSIS — K6289 Other specified diseases of anus and rectum: Secondary | ICD-10-CM | POA: Insufficient documentation

## 2022-05-01 MED ORDER — IOHEXOL 9 MG/ML PO SOLN
ORAL | Status: AC
  Filled 2022-05-01: qty 1000

## 2022-05-01 MED ORDER — IOHEXOL 300 MG/ML  SOLN
100.0000 mL | Freq: Once | INTRAMUSCULAR | Status: AC | PRN
  Administered 2022-05-01: 100 mL via INTRAVENOUS

## 2022-05-01 NOTE — Progress Notes (Signed)
MRI Rectal Cancer Staging order placed per Dr. Delton Coombes

## 2022-05-03 ENCOUNTER — Ambulatory Visit (HOSPITAL_COMMUNITY)
Admission: RE | Admit: 2022-05-03 | Discharge: 2022-05-03 | Disposition: A | Discharge status: Home / Self Care | Payer: PPO | Source: Ambulatory Visit | Attending: Hematology | Admitting: Hematology

## 2022-05-03 DIAGNOSIS — C20 Malignant neoplasm of rectum: Secondary | ICD-10-CM

## 2022-05-04 ENCOUNTER — Encounter (HOSPITAL_COMMUNITY): Payer: Self-pay | Admitting: Internal Medicine

## 2022-05-07 DIAGNOSIS — C20 Malignant neoplasm of rectum: Secondary | ICD-10-CM | POA: Insufficient documentation

## 2022-05-07 NOTE — Progress Notes (Signed)
James Miles, [place] [address]   Clinic Day:  05/08/2022  Referring physician: [name], [facility]  Patient Care Team: [facility] as PCP - General Brien Mates, RN as Oncology Nurse Navigator (Medical Oncology) Derek Jack, MD as Medical Oncologist (Medical Oncology)   ASSESSMENT & PLAN:   Assessment:  1.  Stage II (T3 N0 M0) low rectal adenocarcinoma, MMR preserved: - 74-month history of intermittent rectal bleeding.  No weight loss. - Colonoscopy (04/26/2022): Large cauliflower mass palpated in the distal rectum on DRE.  Scattered diverticula in the descending colon.  5 mm polyp in the ascending colon.  In the rectum, exophytic semilunar neoplastic appearing process beginning at the anal verge and extending proximally about 6 cm. - Pathology (04/26/2022): Invasive moderately differentiated adenocarcinoma of the rectal mass.  MMR preserved. - CT CAP (05/01/2022): Nodular wall thickening along the rectum.  No intrinsic abnormal lymph nodes.  No liver lesion.  Fatty liver.  Nodule along the left side of the urinary bladder.  Small mass along the course of the distal left ureter.  Worrisome for multifocal TCC.  Small lung nodules measuring up to 4 mm. - MRI pelvis (05/03/2022): 6.4 cm left lateral low rectal tumor abutting the internal anal sphincter, T3c N0.  Distance from tumor to the internal anal sphincter is 0 cm.  Tumor measures 6.4 cm in length and up to 2.2 cm in thickness.  2.  Social/family history: -He lives at home and is independent of ADLs and IADLs.  He is accompanied by his son today.  Worked at [employer] prior to retirement.  Also served in [place] but denied any exposure to agent orange.  Quit smoking 1 year ago.  Smoked 1 to 2 packs/day for the last 61 years.  Started at age 12. - No family history of malignancies.  Plan:  1.  Stage II (T3 cN0 M0) low rectal adenocarcinoma, MMR preserved: -  We discussed colonoscopy report, pathology findings and imaging findings with the patient in detail. - We discussed treatment paradigm with 4 months of FOLFOX based chemotherapy followed by long course chemoradiation with Xeloda followed by restaging and transabdominal resection.  If complete clinical response, close surveillance is also an option. - Recommend radiation oncology and surgical oncology evaluation. - Because of coexisting bladder mass/ureteral mass findings, I have recommended PET CT scan. - We also discussed port placement. - RTC after PET scan.  2.  Left urinary bladder mass and distal left ureteral mass: - Discussed incidental findings and reviewed images with the patient and his son. - Recommend CT urogram with delayed imaging.  Based on CT urogram results, will consider urology referral.   Orders Placed This Encounter  Procedures   NM PET Image Initial (PI) Skull Base To Thigh    Standing Status:   Future    Standing Expiration Date:   05/08/2023    Order Specific Question:   If indicated for the ordered procedure, I authorize the administration of a radiopharmaceutical per Radiology protocol    Answer:   Yes    Order Specific Question:   Preferred imaging location?    Answer:   [facility]    Order Specific Question:   Release to patient    Answer:   Immediate   IR IMAGING GUIDED PORT INSERTION    Standing Status:   Future    Standing Expiration Date:   05/08/2023    Order Specific Question:  Reason for Exam (SYMPTOM  OR DIAGNOSIS REQUIRED)    Answer:   chemotherapy administration    Order Specific Question:   Preferred Imaging Location?    Answer:   [facility]    Order Specific Question:   Release to patient    Answer:   Immediate   CT HEMATURIA WORKUP    Standing Status:   Future    Standing Expiration Date:   05/08/2023    Order Specific Question:   Reason for Exam (SYMPTOM  OR DIAGNOSIS REQUIRED)    Answer:   Blass mass, Recommend further workup such as  CT urogram with delayed imaging postcontrast to evaluate the urothelial tract    Order Specific Question:   Preferred imaging location?    Answer:   [facility]    Order Specific Question:   Release to patient    Answer:   Immediate      I,Katie Daubenspeck,acting as a scribe for Derek Jack, MD.,have documented all relevant documentation on the behalf of Derek Jack, MD,as directed by  Derek Jack, MD while in the presence of Derek Jack, MD.   I, Derek Jack MD, have reviewed the above documentation for accuracy and completeness, and I agree with the above.   Derek Jack, MD   4/1/20245:05 PM  CHIEF COMPLAINT/PURPOSE OF CONSULT:   Diagnosis: Rectal adenocarcinoma   Cancer Staging  Rectal adenocarcinoma Staging form: Colon and Rectum, AJCC 8th Edition - Clinical stage from 05/07/2022: Stage IIA (cT3, cN0, cM0) - Unsigned    Prior Therapy: None  Current Therapy: Chemoradiation followed by surgery   HISTORY OF PRESENT ILLNESS:   Oncology History   No history exists.      James Miles is a 77 y.o. male presenting to clinic today for evaluation of rectal cancer at the request of Dr. Gala Romney.  He presented to his PCP with a six month h/o rectal bleeding. His last colonoscopy in 07/2005 showed hyperplastic polyps, and he did not return for repeat in 10 years as recommended. However, a Cologuard test in 10/2018 was negative. He was referred to Venetia Night, NP in GI on 04/18/22, DRE was benign with no overt blood and small amount of hemorrhoid tissue felt. He proceeded to colonoscopy on 04/26/22 with Dr. Gala Romney, which revealed a bulky rectal neoplasm beginning at the anal verge and extending about 6 cm proximally. Pathology from the procedure confirmed invasive moderately differentiated adenocarcinoma, MMR normal. A CEA obtained the same day was WNL at 2.6.  He underwent staging CT C/A/P on 05/01/22 showing: nodular wall thickening  along rectum with adjacent vascular engorgement; no abnormal lymph node enlargement or liver lesion. Additionally, incidental findings include a nodule along the left side of the urinary bladder and a small mass along the course of the distal left ureter, worrisome for multifocal TCC, and small lung nodules measuring up to 4 mm.  He also underwent staging pelvis MRI on 05/03/22 showing: 6.4 cm left lateral low rectal tumor abutting internal anal sphincter; stage T3c, N0.  Of note, he is not yet scheduled to see a colorectal surgeon.  Today, he states that he is doing well overall. His appetite level is at 100%. His energy level is at 75%.  PAST MEDICAL HISTORY:   Past Medical History: Past Medical History:  Diagnosis Date   Back pain    COPD (chronic obstructive pulmonary disease)    Diabetes mellitus without complication    Hyperlipidemia     Surgical History: Past Surgical History:  Procedure Laterality Date   BIOPSY  04/26/2022   Procedure: BIOPSY;  Surgeon: Daneil Dolin, MD;  [facility];  Service: Endoscopy;;   COLONOSCOPY WITH PROPOFOL N/A 04/26/2022   Procedure: COLONOSCOPY WITH PROPOFOL;  Surgeon: Daneil Dolin, MD;  [facility];  Service: Endoscopy;  Laterality: N/A;  10:00am; ASA 3   hemorhoidectomy     POLYPECTOMY  04/26/2022   Procedure: POLYPECTOMY;  Surgeon: Daneil Dolin, MD;  [facility];  Service: Endoscopy;;    Social History: Social History   Socioeconomic History   Marital status: Widowed    Spouse name: Not on file   Number of children: 2   Years of education: Not on file   Highest education level: Not on file  Occupational History   Not on file  Tobacco Use   Smoking status: Former    Years: 82    Types: Cigarettes    Quit date: 08/2016    Years since quitting: 5.7   Smokeless tobacco: Never  Vaping Use   Vaping Use: Never used  Substance and Sexual Activity   Alcohol use: Not on file   Drug use: Never    Sexual activity: Not Currently  Other Topics Concern   Not on file  Social History Narrative   Not on file   Social Determinants of Health   Financial Resource Strain: Not on file  Food Insecurity: No Food Insecurity (05/08/2022)   Hunger Vital Sign    Worried About Running Out of Food in the Last Year: Never true    Ran Out of Food in the Last Year: Never true  Transportation Needs: No Transportation Needs (05/08/2022)   PRAPARE - Hydrologist (Medical): No    Lack of Transportation (Non-Medical): No  Physical Activity: Not on file  Stress: Not on file  Social Connections: Not on file  Intimate Partner Violence: Not At Risk (05/08/2022)   Humiliation, Afraid, Rape, and Kick questionnaire    Fear of Current or Ex-Partner: No    Emotionally Abused: No    Physically Abused: No    Sexually Abused: No    Family History: History reviewed. No pertinent family history.  Current Medications:  Current Outpatient Medications:    acidophilus (RISAQUAD) CAPS capsule, Take 1 capsule by mouth daily., Disp: , Rfl:    albuterol (VENTOLIN HFA) 108 (90 Base) MCG/ACT inhaler, Inhale 2 puffs into the lungs every 6 (six) hours as needed for wheezing or shortness of breath., Disp: , Rfl:    allopurinol (ZYLOPRIM) 300 MG tablet, Take 300 mg by mouth daily., Disp: , Rfl:    atorvastatin (LIPITOR) 20 MG tablet, Take 20 mg by mouth daily., Disp: , Rfl:    JARDIANCE 10 MG TABS tablet, Take 20 mg by mouth daily., Disp: , Rfl:    lisinopril (ZESTRIL) 20 MG tablet, Take 20 mg by mouth daily., Disp: , Rfl:    loperamide (IMODIUM) 2 MG capsule, Take 2 mg by mouth 3 (three) times daily as needed for diarrhea or loose stools., Disp: , Rfl:    MELATONIN PO, Take 1 tablet by mouth at bedtime as needed (sleep)., Disp: , Rfl:    Allergies: No Known Allergies  REVIEW OF SYSTEMS:   Review of Systems  Constitutional:  Negative for chills, fatigue and fever.  HENT:   Negative for  lump/mass, mouth sores, nosebleeds, sore throat and trouble swallowing.   Eyes:  Negative for eye problems.  Respiratory:  Positive for shortness of breath. Negative for cough.   Cardiovascular:  Negative for chest pain, leg swelling and palpitations.  Gastrointestinal:  Positive for blood in stool, constipation and diarrhea. Negative for abdominal pain, nausea and vomiting.  Genitourinary:  Negative for bladder incontinence, difficulty urinating, dysuria, frequency, hematuria and nocturia.   Musculoskeletal:  Negative for arthralgias, back pain, flank pain, myalgias and neck pain.  Skin:  Negative for itching and rash.  Neurological:  Negative for dizziness, headaches and numbness.  Hematological:  Does not bruise/bleed easily.  Psychiatric/Behavioral:  Negative for depression, sleep disturbance and suicidal ideas. The patient is not nervous/anxious.   All other systems reviewed and are negative.    VITALS:   Blood pressure (!) 140/85, pulse 65, temperature (!) 97.5 F (36.4 C), temperature source Oral, resp. rate 16, height 5\' 11"  (1.803 m), weight 177 lb (80.3 kg), SpO2 95 %.  Wt Readings from Last 3 Encounters:  05/08/22 177 lb (80.3 kg)  04/21/22 181 lb 4.8 oz (82.2 kg)  04/18/22 181 lb 4.8 oz (82.2 kg)    Body mass index is 24.69 kg/m.  Performance status (ECOG): 1 - Symptomatic but completely ambulatory  PHYSICAL EXAM:   Physical Exam Vitals and nursing note reviewed. Exam conducted with a chaperone present.  Constitutional:      Appearance: Normal appearance.  Cardiovascular:     Rate and Rhythm: Normal rate and regular rhythm.     Pulses: Normal pulses.     Heart sounds: Normal heart sounds.  Pulmonary:     Effort: Pulmonary effort is normal.     Breath sounds: Normal breath sounds.  Abdominal:     Palpations: Abdomen is soft. There is no hepatomegaly, splenomegaly or mass.     Tenderness: There is no abdominal tenderness.  Musculoskeletal:     Right lower leg:  Edema present.     Left lower leg: Edema present.  Lymphadenopathy:     Cervical: No cervical adenopathy.     Right cervical: No superficial, deep or posterior cervical adenopathy.    Left cervical: No superficial, deep or posterior cervical adenopathy.     Upper Body:     Right upper body: No supraclavicular or axillary adenopathy.     Left upper body: No supraclavicular or axillary adenopathy.  Neurological:     General: No focal deficit present.     Mental Status: He is alert and oriented to person, place, and time.  Psychiatric:        Mood and Affect: Mood normal.        Behavior: Behavior normal.     LABS:      Latest Ref Rng & Units 04/26/2022   12:55 PM 10/03/2018    1:43 AM  CBC  WBC 4.0 - 10.5 K/uL 8.9  9.2      Hemoglobin 13.0 - 17.0 g/dL 15.8  17.1      Hematocrit 39.0 - 52.0 % 48.4  50      Platelets 150 - 400 K/uL 201  216         This result is from an external source.      Latest Ref Rng & Units 04/26/2022   12:55 PM 04/21/2022   10:01 AM 04/10/2019    8:57 AM  CMP  Glucose 70 - 99 mg/dL 177  142  120   BUN 8 - 23 mg/dL 7  13  12    Creatinine 0.61 - 1.24 mg/dL 0.81  0.83  0.73  Sodium 135 - 145 mmol/L 135  140  140   Potassium 3.5 - 5.1 mmol/L 3.5  3.5  4.0   Chloride 98 - 111 mmol/L 102  105  101   CO2 22 - 32 mmol/L 26  24  30    Calcium 8.9 - 10.3 mg/dL 8.4  9.1  9.4   Total Protein 6.5 - 8.1 g/dL 7.0   6.8   Total Bilirubin 0.3 - 1.2 mg/dL 0.5   0.6   Alkaline Phos 38 - 126 U/L 69     AST 15 - 41 U/L 21   13   ALT 0 - 44 U/L 26   16      Lab Results  Component Value Date   CEA1 2.6 04/26/2022   /  CEA  Date Value Ref Range Status  04/26/2022 2.6 0.0 - 4.7 ng/mL Final    Comment:    (NOTE)                             Nonsmokers          <3.9                             Smokers             <5.6 [employer] Electrochemiluminescence Immunoassay (ECLIA) Values obtained with different assay methods or kits cannot be used  interchangeably.  Results cannot be interpreted as absolute evidence of the presence or absence of malignant disease. Performed At: [facility] [place], [place] [id number] Rush Farmer MD [id number]    No results found for: "PSA1" No results found for: "[id number]" No results found for: "CAN125"  No results found for: "TOTALPROTELP", "ALBUMINELP", "A1GS", "A2GS", "BETS", "BETA2SER", "GAMS", "MSPIKE", "SPEI" No results found for: "TIBC", "FERRITIN", "IRONPCTSAT" No results found for: "LDH"   STUDIES:   MR PELVIS WO CM RECTAL CA STAGING  Result Date: 05/05/2022 CLINICAL DATA:  Rectal cancer staging EXAM: MRI PELVIS WITHOUT CONTRAST TECHNIQUE: Multiplanar multisequence MR imaging of the pelvis was performed. No intravenous contrast was administered. Ultrasound gel was administered per rectum to optimize tumor evaluation. COMPARISON:  CT chest abdomen pelvis dated 05/01/2022 FINDINGS: TUMOR LOCATION Tumor distance from Anal Verge/Skin surface: 3.3 cm (series 2/image 18) Tumor distance to Internal Anal sphincter: 0 cm (series 11/image 29) TUMOR DESCRIPTION Circumferential extent: Left lateral tumor, extending from 11:30 to 8:00 (series 10/image 35) Tumor Size and volume: Tumor measures 6.4 cm in length (series 2/image 18) and up to 2.2 cm in thickness (series 10/image 31) T - CATEGORY Extension through [employer]: Yes 6-23mm=T3c, with at least 6 mm extension at 2 o'clock (series 10/image 31) Shortest Distance of any tumor/node from Mesorectal fascia: 0 mm, with tumor directly abutting the posterior aspect of the prostate (series 10/image 34), but without convincing invasion (series 4/image 21) Extramural Vascular Invasion/Tumor Thrombus: No Invasion of Anterior Peritoneal Reflection: No Involvement of Adjacent Organs or Pelvic Sidewall: No Levator Ani Involvement: No N - CATEGORY Mesorectal Lymph Nodes >=54mm: None=N0 Extra-mesorectal Lymphadenopathy: No Other: 12 mm  polypoid lesion along the left posterolateral bladder (series 10/image 25), suspicious for incidental primary bladder neoplasm. IMPRESSION: 6.4 cm left lateral low rectal tumor abutting the internal anal sphincter, as above. Rectal adenocarcinoma T stage: T3C Rectal adenocarcinoma N stage:  N0 Distance from tumor to the internal anal sphincter is 0 cm. Electronically Signed  By: Julian Hy M.D.   On: 05/05/2022 01:01   CT CHEST ABDOMEN PELVIS W CONTRAST  Result Date: 05/02/2022 CLINICAL DATA:  Rectal mass. * Tracking Code: BO * EXAM: CT CHEST, ABDOMEN, AND PELVIS WITH CONTRAST TECHNIQUE: Multidetector CT imaging of the chest, abdomen and pelvis was performed following the standard protocol during bolus administration of intravenous contrast. RADIATION DOSE REDUCTION: This exam was performed according to the departmental dose-optimization program which includes automated exposure control, adjustment of the mA and/or kV according to patient size and/or use of iterative reconstruction technique. CONTRAST:  161mL OMNIPAQUE IOHEXOL 300 MG/ML  SOLN COMPARISON:  None Available. FINDINGS: CT CHEST FINDINGS Cardiovascular: The thoracic aorta has a normal course and caliber with scattered mild atherosclerotic plaque heart is nonenlarged. No pericardial effusion. Mediastinum/Nodes: No specific abnormal lymph node enlargement identified in the axillary regions, hilum or mediastinum normal caliber thoracic esophagus. Small hiatal hernia. Slightly enlarged right thyroid lobe extending posteriorly. Lungs/Pleura: Mild debris along the right main bronchus. Centrilobular emphysematous lung changes are identified particularly in the upper lung zones. Azygous fissure. There is some linear opacity of the lung bases likely scar or atelectasis. There is a 4 mm left upper lobe lung nodule on series 2 image 23. 3 mm subpleural nodule laterally along the right along the on series 2, image 42. no consolidation, pneumothorax or  effusion. Musculoskeletal: Scattered degenerative changes along the spine. CT ABDOMEN PELVIS FINDINGS Hepatobiliary: Slight fatty liver infiltration. No space-occupying liver lesion. Gallbladder is present. Patent portal vein. Pancreas: Unremarkable. No pancreatic ductal dilatation or surrounding inflammatory changes. Spleen: Normal in size without focal abnormality. Adrenals/Urinary Tract: Slight nonspecific thickening of the adrenal glands. Punctate nonobstructing stone towards the lower pole of each kidney. Enhancing renal mass or collecting system dilatation. There is a nodular area along the course of the left ureter just proximal to the bladder on series 2, image 110 measuring 14 x 10 mm. The bladder wall is thickened and trabeculated. There is a left-sided wall calcification. There is also a nodular area along left side of the urinary bladder on series 2, image 112 measuring 9 mm. The intrinsic bladder mass is possible. Please see coronal series 4, image 100. This could be neoplastic. At the level of the mass there is more asymmetric bladder wall thickening. Stomach/Bowel: There is nodular wall thickening along the low rectum with nodularity along the outer surface of the wall. Please correlate with the exact location of mass. This extends down to the edge of the anorectal junction. Adjacent vascular engorgement identified. Proximally in the sigmoid colon is presence of numerous diverticula. No bowel obstruction few. A few right-sided colonic diverticular seen. Normal appendix. Stomach is nondilated. Small bowel is nondilated. Vascular/Lymphatic: Normal caliber aorta and IVC with scattered vascular calcifications. There is ectasia of the left common iliac artery with diameter approaching 19 mm. This has has a focal area aneurysmal dilatation with some plaque and thrombus as seen best on coronal image 101 series 4. No specific abnormal lymph node enlargement identified in the abdomen. Reproductive: Enlarged  prostate with mass effect along the base of the bladder. Other: No ascites or free air. Small fat containing umbilical hernia. Musculoskeletal: Curvature of the spine with scattered degenerative changes of the spine and pelvis. Trace anterolisthesis of L4 on L5. IMPRESSION: Nodular wall thickening along the rectum consistent with the history of known mass. Adjacent vascular engorgement. No intrinsic abnormal lymph node enlargement. No liver lesion. Fatty liver infiltration. Nodule along the left side of the  urinary bladder. In addition there is small mass along the course of the distal left ureter. This is worrisome for overall multifocal TCC. Recommend further workup such as CT urogram with delayed imaging postcontrast to evaluate the urothelial tract. Please correlate for any known history. Small lung nodules measuring up to 4 mm. Attention on follow-up for the patient's neoplasm versus Non-contrast chest CT can be considered in 12 months if patient is high-risk. This recommendation follows the consensus statement: Guidelines for Management of Incidental Pulmonary Nodules Detected on CT Images: From the Fleischner Society 2017; Radiology 2017; 284:228-243. Fatty liver infiltration. Small hiatal hernia. Colonic diverticula. Emphysematous changes of the lungs. 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 Findings will be discussed with the ordering service by the Radiology physician assistant team Electronically Signed   By: Jill Side M.D.   On: 05/02/2022 10:43

## 2022-05-08 ENCOUNTER — Encounter: Payer: Self-pay | Admitting: Hematology

## 2022-05-08 ENCOUNTER — Other Ambulatory Visit (HOSPITAL_COMMUNITY): Payer: Self-pay | Admitting: Hematology

## 2022-05-08 ENCOUNTER — Inpatient Hospital Stay: Payer: PPO | Attending: Hematology | Admitting: Hematology

## 2022-05-08 VITALS — BP 140/85 | HR 65 | Temp 97.5°F | Resp 16 | Ht 71.0 in | Wt 177.0 lb

## 2022-05-08 DIAGNOSIS — C20 Malignant neoplasm of rectum: Secondary | ICD-10-CM | POA: Insufficient documentation

## 2022-05-08 DIAGNOSIS — N2889 Other specified disorders of kidney and ureter: Secondary | ICD-10-CM | POA: Diagnosis not present

## 2022-05-08 DIAGNOSIS — Z87891 Personal history of nicotine dependence: Secondary | ICD-10-CM | POA: Insufficient documentation

## 2022-05-08 DIAGNOSIS — Z79899 Other long term (current) drug therapy: Secondary | ICD-10-CM | POA: Insufficient documentation

## 2022-05-08 DIAGNOSIS — N329 Bladder disorder, unspecified: Secondary | ICD-10-CM | POA: Insufficient documentation

## 2022-05-08 NOTE — Patient Instructions (Addendum)
[facility]  Discharge Instructions  You were seen and examined today by Dr. Delton Coombes. Dr. Delton Coombes is a medical oncologist, meaning that he specializes in the treatment of cancer diagnoses. Dr. Delton Coombes discussed your past medical history, family history of cancers, and the events that led to you being here today.  You were referred to Dr. Delton Coombes due to a new diagnosis of rectal cancer.  There is no obvious spread of the cancer to other areas of your body, Dr. Delton Coombes has requested a PET scan to ensure there is no spread of the cancer elsewhere in the body.  The CT scan did show a questionable area on your bladder/ureter (tube from the kidney to bladder). Dr. Delton Coombes will order a specific CT scan to look at that again.  The typical course of treatment for this cancer is 4 months of traditional chemotherapy, followed by 6 weeks of chemotherapy pills with radiation therapy, followed by surgery. The goal of this treatment is to cure your cancer.  Prior to the start of chemotherapy, you will need a Port-A-Cath placed. This can be done by the radiologist in [place].  Dr. Delton Coombes will also refer you to the Radiation Oncologist in preparation for radiation therapy. This does not have to be complete prior to the start of chemotherapy.  Follow-up as scheduled.  Thank you for choosing [facility] to provide your oncology and hematology care.   To afford each patient quality time with our provider, please arrive at least 15 minutes before your scheduled appointment time. You may need to reschedule your appointment if you arrive late (10 or more minutes). Arriving late affects you and other patients whose appointments are after yours.  Also, if you miss three or more appointments without notifying the office, you may be dismissed from the clinic at the provider's discretion.    Again, thank you for choosing [facility].  Our hope is that these requests will decrease the amount of time that you wait before being seen by our physicians.   If you have a lab appointment with the [facility] please come in thru the Main Entrance and check in at the main information desk.           _____________________________________________________________  Should you have questions after your visit to [facility], please contact our office at [phone] and follow the prompts.  Our office hours are 8:00 a.m. to 4:30 p.m. Monday - Thursday and 8:00 a.m. to 2:30 p.m. Friday.  Please note that voicemails left after 4:00 p.m. may not be returned until the following business day.  We are closed weekends and all major holidays.  You do have access to a nurse 24-7, just call the main number to the clinic [phone] and do not press any options, hold on the line and a nurse will answer the phone.    For prescription refill requests, have your pharmacy contact our office and allow 72 hours.    Masks are optional in the cancer centers. If you would like for your care team to wear a mask while they are taking care of you, please let them know. You may have one support person who is at least 76 years old accompany you for your appointments.

## 2022-05-09 ENCOUNTER — Encounter (HOSPITAL_COMMUNITY): Payer: Self-pay | Admitting: Radiology

## 2022-05-09 ENCOUNTER — Ambulatory Visit (HOSPITAL_COMMUNITY)
Admission: RE | Admit: 2022-05-09 | Discharge: 2022-05-09 | Disposition: A | Discharge status: Home / Self Care | Payer: PPO | Source: Ambulatory Visit | Attending: Hematology | Admitting: Hematology

## 2022-05-09 DIAGNOSIS — C20 Malignant neoplasm of rectum: Secondary | ICD-10-CM | POA: Diagnosis not present

## 2022-05-09 MED ORDER — IOHEXOL 300 MG/ML  SOLN
125.0000 mL | Freq: Once | INTRAMUSCULAR | Status: AC | PRN
  Administered 2022-05-09: 125 mL via INTRAVENOUS

## 2022-05-10 ENCOUNTER — Other Ambulatory Visit: Payer: Self-pay | Admitting: Internal Medicine

## 2022-05-10 DIAGNOSIS — C20 Malignant neoplasm of rectum: Secondary | ICD-10-CM

## 2022-05-10 NOTE — H&P (Signed)
Chief Complaint: Patient was seen in consultation today for rectal adenocarcinoma at the request of Katragadda,Sreedhar  Referring Physician(s): Katragadda,Sreedhar  Supervising Physician: Pernell Dupre  Patient Status: [facility] - Out-pt  History of Present Illness:  James Miles is a 77 y.o. male followed by oncology for rectal adenocarcinoma. He experienced 6 month history of intermittent rectal bleeding and underwent colonoscopy 04/26/22 that revealed large cauliflower mass palpated in the distal rectum on DRE. Scattered diverticula in the descending colon. 5 mm polyp in the ascending colon. In the rectum, exophytic semilunar neoplastic appearing process beginning at the anal verge and extending proximally about 6 cm. Pathology resulted invasive moderately differentiated adenocarcinoma of the rectal mass. He has been referred to IR for tunneled catheter with port placement for chemotherapy.   Patient denies chills, fever, loss of appetite, fatigue, weight loss, CP, abdominal pain, N/V, dizziness, HA, lightheadedness or weakness. He endorses baseline shortness of breath from COPD, lower extremity edema and anal bleeding. He is n.p.o. per order.  Past Medical History:  Diagnosis Date   Back pain    COPD (chronic obstructive pulmonary disease)    Diabetes mellitus without complication    Hyperlipidemia     Past Surgical History:  Procedure Laterality Date   BIOPSY  04/26/2022   Procedure: BIOPSY;  Surgeon: Corbin Ade, MD;  [facility]: [facility];  Service: Endoscopy;;   COLONOSCOPY WITH PROPOFOL N/A 04/26/2022   Procedure: COLONOSCOPY WITH PROPOFOL;  Surgeon: Corbin Ade, MD;  Location: [facility];  Service: Endoscopy;  Laterality: N/A;  10:00am; ASA 3   hemorhoidectomy     POLYPECTOMY  04/26/2022   Procedure: POLYPECTOMY;  Surgeon: Corbin Ade, MD;  Location: [facility];  Service: Endoscopy;;    Allergies: Patient has no known  allergies.  Medications: Prior to Admission medications   Medication Sig Start Date End Date Taking? Authorizing Provider  acidophilus (RISAQUAD) CAPS capsule Take 1 capsule by mouth daily.    [provider]  albuterol (VENTOLIN HFA) 108 (90 Base) MCG/ACT inhaler Inhale 2 puffs into the lungs every 6 (six) hours as needed for wheezing or shortness of breath. 03/29/19   [provider]  allopurinol (ZYLOPRIM) 300 MG tablet Take 300 mg by mouth daily. 03/29/19   [provider]  atorvastatin (LIPITOR) 20 MG tablet Take 20 mg by mouth daily. 03/29/19   [provider]  JARDIANCE 10 MG TABS tablet Take 20 mg by mouth daily.    [provider]  lisinopril (ZESTRIL) 20 MG tablet Take 20 mg by mouth daily.    [provider]  loperamide (IMODIUM) 2 MG capsule Take 2 mg by mouth 3 (three) times daily as needed for diarrhea or loose stools.    [provider]  MELATONIN PO Take 1 tablet by mouth at bedtime as needed (sleep).    [provider]     No family history on file.  Social History   Socioeconomic History   Marital status: Widowed    Spouse name: Not on file   Number of children: 2   Years of education: Not on file   Highest education level: Not on file  Occupational History   Not on file  Tobacco Use   Smoking status: Former    Years: 50    Types: Cigarettes    Quit date: 08/2016    Years since quitting: 5.7   Smokeless tobacco: Never  Vaping Use   Vaping Use: Never used  Substance and Sexual Activity   Alcohol use: Not on file   Drug use: Never   Sexual activity: Not Currently  Other Topics Concern   Not on file  Social History Narrative   Not on file   Social Determinants of Health   Financial Resource Strain: Not on file  Food Insecurity: No Food Insecurity (05/08/2022)   Hunger Vital Sign    Worried About Running Out of Food in the Last Year: Never true    Ran Out of Food in the Last Year: Never  true  Transportation Needs: No Transportation Needs (05/08/2022)   PRAPARE - Administrator, Civil Service (Medical): No    Lack of Transportation (Non-Medical): No  Physical Activity: Not on file  Stress: Not on file  Social Connections: Not on file    Review of Systems: A 12 point ROS discussed and pertinent positives are indicated in the HPI above.  All other systems are negative.  Review of Systems  Constitutional:  Negative for appetite change, chills, fatigue, fever and unexpected weight change.  Respiratory:  Positive for shortness of breath.   Cardiovascular:  Positive for leg swelling. Negative for chest pain.  Gastrointestinal:  Positive for anal bleeding. Negative for abdominal pain, nausea and vomiting.  Neurological:  Negative for dizziness, weakness, light-headedness and headaches.    Vital Signs: BP 105/82   Pulse 69   Temp 97.8 F (36.6 C) (Oral)   Resp 20   Ht 5\' 11"  (1.803 m)   Wt 176 lb (79.8 kg)   SpO2 93%   BMI 24.55 kg/m      Physical Exam Vitals reviewed.  Constitutional:      General: He is not in acute distress.    Appearance: Normal appearance. He is not ill-appearing.  HENT:     Head: Normocephalic and atraumatic.     Mouth/Throat:     Mouth: Mucous membranes are dry.     Pharynx: Oropharynx is clear.  Eyes:     Extraocular Movements: Extraocular movements intact.  Cardiovascular:     Rate and Rhythm: Normal rate and regular rhythm.     Pulses: Normal pulses.     Heart sounds: Normal heart sounds.  Pulmonary:     Effort: Pulmonary effort is normal. No respiratory distress.     Breath sounds: Normal breath sounds.  Abdominal:     General: Bowel sounds are normal. There is no distension.     Palpations: Abdomen is soft.     Tenderness: There is no abdominal tenderness. There is no guarding.  Skin:    General: Skin is warm and dry.  Neurological:     Mental Status: He is oriented to person, place, and time.  Psychiatric:         Mood and Affect: Mood normal.        Behavior: Behavior normal.        Thought Content: Thought content normal.        Judgment: Judgment normal.     Imaging: NM PET Image Initial (PI) Skull Base To Thigh  Result Date: 05/11/2022 CLINICAL DATA:  Initial treatment strategy for rectal carcinoma. EXAM: NUCLEAR MEDICINE PET SKULL BASE TO THIGH TECHNIQUE: 9.1 mCi F-18 FDG was injected intravenously. Full-ring PET imaging was performed from the skull base to thigh after the radiotracer. CT data was obtained and used for attenuation correction and anatomic localization. Fasting blood glucose: 149 mg/dl COMPARISON:  CT on 19/37/9024 FINDINGS: Mediastinal blood-pool activity (background): SUV max =  2.0 Liver activity (reference): SUV max = N/A NECK:  No hypermetabolic lymph nodes or masses. Incidental CT findings:  None. CHEST: No hypermetabolic lymph nodes. No suspicious pulmonary nodules seen on CT images. Incidental CT findings: Aortic and coronary atherosclerotic calcification incidentally noted. Mild to moderate centrilobular emphysema also seen. ABDOMEN/PELVIS: No abnormal hypermetabolic activity within the liver, pancreas, adrenal glands, or spleen. Hypermetabolic mass is seen in the rectum, with SUV max of 9.4. A 5 mm left posterior perirectal lymph node is seen on image 244/3. This shows no FDG uptake but is too small to accurately characterize by PET. No hypermetabolic lymph nodes in the abdomen or pelvis. Incidental CT findings: Colonic diverticulosis noted, without evidence of diverticulitis. Aortic atherosclerotic calcification incidentally noted. Tiny umbilical hernia is seen, which contains only fat. SKELETON: No focal hypermetabolic bone lesions to suggest skeletal metastasis. Incidental CT findings:  None. IMPRESSION: Hypermetabolic rectal mass, consistent with primary rectal carcinoma. 5 mm left posterior perirectal lymph node shows no FDG uptake, but is too small to accurately characterize  by PET. No other evidence of metastatic disease. Aortic Atherosclerosis (ICD10-I70.0) and Emphysema (ICD10-J43.9). Electronically Signed   By: Danae Orleans M.D.   On: 05/11/2022 15:44   CT HEMATURIA WORKUP  Result Date: 05/09/2022 CLINICAL DATA:  Hematuria. Bladder mass on recent MRI. Rectal carcinoma. * Tracking Code: BO * EXAM: CT ABDOMEN AND PELVIS WITHOUT AND WITH CONTRAST TECHNIQUE: Multidetector CT imaging of the abdomen and pelvis was performed following the standard protocol before and following the bolus administration of intravenous contrast. RADIATION DOSE REDUCTION: This exam was performed according to the departmental dose-optimization program which includes automated exposure control, adjustment of the mA and/or kV according to patient size and/or use of iterative reconstruction technique. CONTRAST:  OMNIPAQUE IOHEXOL 300 MG/ML  SOLN COMPARISON:  05/01/2022 FINDINGS: Lower Chest: No acute findings. Hepatobiliary: No hepatic masses identified. Gallbladder is unremarkable. No evidence of biliary ductal dilatation. Pancreas:  No mass or inflammatory changes. Spleen: Within normal limits in size and appearance. Adrenals/Urinary Tract: No adrenal masses identified. Few tiny 1-2 mm calculi are noted in both kidneys. No evidence of ureteral calculi or hydronephrosis. No suspicious renal masses identified. No masses seen involving the collecting systems or ureters. Mild diffuse bladder wall thickening and trabeculation is seen which may be due to chronic bladder outlet obstruction or cystitis. A focal enhancing soft tissue nodule measuring approximately 1.2 cm is seen along the left lateral bladder wall, suspicious for bladder carcinoma. Stomach/Bowel: Concentric rectal wall thickening and enhancement is seen, consistent with known primary rectal carcinoma. No evidence of obstruction, inflammatory process or abnormal fluid collections. Normal appendix visualized. Diverticulosis is seen mainly  involving the descending and sigmoid colon, however there is no evidence of diverticulitis. Vascular/Lymphatic: No pathologically enlarged lymph nodes. No acute vascular findings. Aortic atherosclerotic calcification incidentally noted. 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 Reproductive:  No mass or other significant abnormality. Other:  None. Musculoskeletal:  No suspicious bone lesions identified. IMPRESSION: 1.2 cm focal enhancing soft tissue nodule along the left lateral bladder wall, suspicious for bladder carcinoma. Low rectal soft tissue mass, consistent with known primary rectal carcinoma. No evidence of metastatic disease within the abdomen or pelvis. Mild diffuse bladder wall thickening and trabeculation, which may be due to chronic bladder outlet obstruction or cystitis. Tiny nonobstructing bilateral renal calculi. Colonic diverticulosis, without radiographic evidence of diverticulitis. 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 Aortic Atherosclerosis (ICD10-I70.0).  Electronically Signed   By: Danae Orleans M.D.   On: 05/09/2022 10:58   MR PELVIS WO CM RECTAL CA STAGING  Result Date: 05/05/2022 CLINICAL DATA:  Rectal cancer staging EXAM: MRI PELVIS WITHOUT CONTRAST TECHNIQUE: Multiplanar multisequence MR imaging of the pelvis was performed. No intravenous contrast was administered. Ultrasound gel was administered per rectum to optimize tumor evaluation. COMPARISON:  CT chest abdomen pelvis dated 05/01/2022 FINDINGS: TUMOR LOCATION Tumor distance from Anal Verge/Skin surface: 3.3 cm (series 2/image 18) Tumor distance to Internal Anal sphincter: 0 cm (series 11/image 29) TUMOR DESCRIPTION Circumferential extent: Left lateral tumor, extending from 11:30 to 8:00 (series 10/image 35) Tumor Size and volume: Tumor measures 6.4 cm in length (series 2/image 18) and up to 2.2 cm in thickness (series 10/image 31) T - CATEGORY Extension through  Muscularis Propria: Yes 6-62mm=T3c, with at least 6 mm extension at 2 o'clock (series 10/image 31) Shortest Distance of any tumor/node from Mesorectal fascia: 0 mm, with tumor directly abutting the posterior aspect of the prostate (series 10/image 34), but without convincing invasion (series 4/image 21) Extramural Vascular Invasion/Tumor Thrombus: No Invasion of Anterior Peritoneal Reflection: No Involvement of Adjacent Organs or Pelvic Sidewall: No Levator Ani Involvement: No N - CATEGORY Mesorectal Lymph Nodes >=39mm: None=N0 Extra-mesorectal Lymphadenopathy: No Other: 12 mm polypoid lesion along the left posterolateral bladder (series 10/image 25), suspicious for incidental primary bladder neoplasm. IMPRESSION: 6.4 cm left lateral low rectal tumor abutting the internal anal sphincter, as above. Rectal adenocarcinoma T stage: T3C Rectal adenocarcinoma N stage:  N0 Distance from tumor to the internal anal sphincter is 0 cm. Electronically Signed   By: Charline Bills M.D.   On: 05/05/2022 01:01   CT CHEST ABDOMEN PELVIS W CONTRAST  Result Date: 05/02/2022 CLINICAL DATA:  Rectal mass. * Tracking Code: BO * EXAM: CT CHEST, ABDOMEN, AND PELVIS WITH CONTRAST TECHNIQUE: Multidetector CT imaging of the chest, abdomen and pelvis was performed following the standard protocol during bolus administration of intravenous contrast. RADIATION DOSE REDUCTION: This exam was performed according to the departmental dose-optimization program which includes automated exposure control, adjustment of the mA and/or kV according to patient size and/or use of iterative reconstruction technique. CONTRAST:  OMNIPAQUE IOHEXOL 300 MG/ML  SOLN COMPARISON:  None Available. FINDINGS: CT CHEST FINDINGS Cardiovascular: The thoracic aorta has a normal course and caliber with scattered mild atherosclerotic plaque heart is nonenlarged. No pericardial effusion. Mediastinum/Nodes: No specific abnormal lymph node enlargement identified in the  axillary regions, hilum or mediastinum normal caliber thoracic esophagus. Small hiatal hernia. Slightly enlarged right thyroid lobe extending posteriorly. Lungs/Pleura: Mild debris along the right main bronchus. Centrilobular emphysematous lung changes are identified particularly in the upper lung zones. Azygous fissure. There is some linear opacity of the lung bases likely scar or atelectasis. There is a 4 mm left upper lobe lung nodule on series 2 image 23. 3 mm subpleural nodule laterally along the right along the on series 2, image 42. no consolidation, pneumothorax or effusion. Musculoskeletal: Scattered degenerative changes along the spine. CT ABDOMEN PELVIS FINDINGS Hepatobiliary: Slight fatty liver infiltration. No space-occupying liver lesion. Gallbladder is present. Patent portal vein. Pancreas: Unremarkable. No pancreatic ductal dilatation or surrounding inflammatory changes. Spleen: Normal in size without focal abnormality. Adrenals/Urinary Tract: Slight nonspecific thickening of the adrenal glands. Punctate nonobstructing stone towards the lower pole of each kidney. Enhancing renal mass or collecting system dilatation. There is a nodular area along the course of the left ureter just proximal to the bladder  on series 2, image 110 measuring 14 x 10 mm. The bladder wall is thickened and trabeculated. There is a left-sided wall calcification. There is also a nodular area along left side of the urinary bladder on series 2, image 112 measuring 9 mm. The intrinsic bladder mass is possible. Please see coronal series 4, image 100. This could be neoplastic. At the level of the mass there is more asymmetric bladder wall thickening. Stomach/Bowel: There is nodular wall thickening along the low rectum with nodularity along the outer surface of the wall. Please correlate with the exact location of mass. This extends down to the edge of the anorectal junction. Adjacent vascular engorgement identified. Proximally in  the sigmoid colon is presence of numerous diverticula. No bowel obstruction few. A few right-sided colonic diverticular seen. Normal appendix. Stomach is nondilated. Small bowel is nondilated. Vascular/Lymphatic: Normal caliber aorta and IVC with scattered vascular calcifications. There is ectasia of the left common iliac artery with diameter approaching 19 mm. This has has a focal area aneurysmal dilatation with some plaque and thrombus as seen best on coronal image 101 series 4. No specific abnormal lymph node enlargement identified in the abdomen. Reproductive: Enlarged prostate with mass effect along the base of the bladder. Other: No ascites or free air. Small fat containing umbilical hernia. Musculoskeletal: Curvature of the spine with scattered degenerative changes of the spine and pelvis. Trace anterolisthesis of L4 on L5. IMPRESSION: Nodular wall thickening along the rectum consistent with the history of known mass. Adjacent vascular engorgement. No intrinsic abnormal lymph node enlargement. No liver lesion. Fatty liver infiltration. Nodule along the left side of the urinary bladder. In addition there is small mass along the course of the distal left ureter. This is worrisome for overall multifocal TCC. Recommend further workup such as CT urogram with delayed imaging postcontrast to evaluate the urothelial tract. Please correlate for any known history. Small lung nodules measuring up to 4 mm. Attention on follow-up for the patient's neoplasm versus Non-contrast chest CT can be considered in 12 months if patient is high-risk. This recommendation follows the consensus statement: Guidelines for Management of Incidental Pulmonary Nodules Detected on CT Images: From the Fleischner Society 2017; Radiology 2017; 284:228-243. Fatty liver infiltration. Small hiatal hernia. Colonic diverticula. Emphysematous changes of the lungs. 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 Findings will be discussed with the  ordering service by the Radiology physician assistant team Electronically Signed   By: Karen Kays M.D.   On: 05/02/2022 10:43    Labs:  CBC: Recent Labs    04/26/22 1255  WBC 8.9  HGB 15.8  HCT 48.4  PLT 201    COAGS: No results for input(s): "INR", "APTT" in the last 8760 hours.  BMP: Recent Labs    04/21/22 1001 04/26/22 1255  NA 140 135  K 3.5 3.5  CL 105 102  CO2 24 26  GLUCOSE 142* 177*  BUN 13 7*  CALCIUM 9.1 8.4*  CREATININE 0.83 0.81  GFRNONAA >60 >60    LIVER FUNCTION TESTS: Recent Labs    04/26/22 1255  BILITOT 0.5  AST 21  ALT 26  ALKPHOS 69  PROT 7.0  ALBUMIN 3.9    TUMOR MARKERS: No results for input(s): "AFPTM", "CEA", "CA199", "CHROMGRNA" in the last 8760 hours.  Assessment and Plan:  77 yo male with PMHx significant for COPD, DM II, HLD and new diagnosis of rectal adenocarcinoma presents to IR for tunneled catheter with port placement.   Patient resting  on stretcher with son at bedside. He is alert and oriented, calm and pleasant. He is in no distress.  Risks and benefits of image guided tunneled catheter with port placement was discussed with the patient including, but not limited to bleeding, infection, pneumothorax, or fibrin sheath development and need for additional procedures.  All of the patient's questions were answered, patient is agreeable to proceed. Consent signed and in chart.  Thank you for this interesting consult.  I greatly enjoyed meeting JIM LUNDIN and look forward to participating in their care.  A copy of this report was sent to the requesting provider on this date.  Electronically Signed: Shon Hough, NP 05/12/2022, 1:49 PM   I spent a total of 20 minutes in face to face in clinical consultation, greater than 50% of which was counseling/coordinating care for rectal adenocarcinoma.

## 2022-05-11 ENCOUNTER — Encounter (HOSPITAL_COMMUNITY)
Admission: RE | Admit: 2022-05-11 | Discharge: 2022-05-11 | Disposition: A | Discharge status: Home / Self Care | Payer: PPO | Source: Ambulatory Visit | Attending: Hematology | Admitting: Hematology

## 2022-05-11 DIAGNOSIS — C20 Malignant neoplasm of rectum: Secondary | ICD-10-CM | POA: Diagnosis not present

## 2022-05-11 MED ORDER — FLUDEOXYGLUCOSE F - 18 (FDG) INJECTION
9.0900 | Freq: Once | INTRAVENOUS | Status: AC | PRN
  Administered 2022-05-11: 9.09 via INTRAVENOUS

## 2022-05-11 NOTE — Progress Notes (Signed)
Patient for IR Port Placement on Fri 05/12/2022, I called and spoke with the patient on the phone and gave pre-procedure instructions. Pt was made aware to be here at 12:30p, NPO after MN prior to procedure as well as driver post procedure/recovery/discharge. Pt stated understanding.  Called 05/11/2022

## 2022-05-12 ENCOUNTER — Ambulatory Visit
Admission: RE | Admit: 2022-05-12 | Discharge: 2022-05-12 | Disposition: A | Discharge status: Home / Self Care | Payer: PPO | Source: Ambulatory Visit | Attending: Hematology | Admitting: Hematology

## 2022-05-12 DIAGNOSIS — Z87891 Personal history of nicotine dependence: Secondary | ICD-10-CM | POA: Diagnosis not present

## 2022-05-12 DIAGNOSIS — R600 Localized edema: Secondary | ICD-10-CM | POA: Diagnosis not present

## 2022-05-12 DIAGNOSIS — C20 Malignant neoplasm of rectum: Secondary | ICD-10-CM | POA: Diagnosis present

## 2022-05-12 DIAGNOSIS — E119 Type 2 diabetes mellitus without complications: Secondary | ICD-10-CM | POA: Insufficient documentation

## 2022-05-12 DIAGNOSIS — J449 Chronic obstructive pulmonary disease, unspecified: Secondary | ICD-10-CM | POA: Diagnosis not present

## 2022-05-12 DIAGNOSIS — E785 Hyperlipidemia, unspecified: Secondary | ICD-10-CM | POA: Diagnosis not present

## 2022-05-12 HISTORY — PX: IR IMAGING GUIDED PORT INSERTION: IMG5740

## 2022-05-12 LAB — GLUCOSE, CAPILLARY: Glucose-Capillary: 138 mg/dL — ABNORMAL HIGH (ref 70–99)

## 2022-05-12 MED ORDER — FENTANYL CITRATE (PF) 100 MCG/2ML IJ SOLN
INTRAMUSCULAR | Status: AC
  Filled 2022-05-12: qty 2

## 2022-05-12 MED ORDER — LIDOCAINE-EPINEPHRINE 1 %-1:100000 IJ SOLN
INTRAMUSCULAR | Status: AC
  Filled 2022-05-12: qty 1

## 2022-05-12 MED ORDER — MIDAZOLAM HCL 2 MG/2ML IJ SOLN
INTRAMUSCULAR | Status: AC
  Filled 2022-05-12: qty 2

## 2022-05-12 MED ORDER — LIDOCAINE-EPINEPHRINE 1 %-1:100000 IJ SOLN
10.0000 mL | Freq: Once | INTRAMUSCULAR | Status: AC
  Administered 2022-05-12: 10 mL via INTRADERMAL

## 2022-05-12 MED ORDER — MIDAZOLAM HCL 2 MG/2ML IJ SOLN
INTRAMUSCULAR | Status: AC | PRN
  Administered 2022-05-12: 1 mg via INTRAVENOUS

## 2022-05-12 MED ORDER — HEPARIN SOD (PORK) LOCK FLUSH 100 UNIT/ML IV SOLN
500.0000 [IU] | Freq: Once | INTRAVENOUS | Status: AC
  Administered 2022-05-12: 500 [IU] via INTRAVENOUS

## 2022-05-12 MED ORDER — HEPARIN SOD (PORK) LOCK FLUSH 100 UNIT/ML IV SOLN
INTRAVENOUS | Status: AC
  Filled 2022-05-12: qty 5

## 2022-05-12 MED ORDER — SODIUM CHLORIDE 0.9 % IV SOLN: INTRAVENOUS | Status: DC

## 2022-05-12 MED ORDER — FENTANYL CITRATE (PF) 100 MCG/2ML IJ SOLN
INTRAMUSCULAR | Status: AC | PRN
  Administered 2022-05-12: 50 ug via INTRAVENOUS

## 2022-05-12 NOTE — Procedures (Signed)
Interventional Radiology Procedure Note  Date of Procedure: 05/12/2022  Procedure: Port placement   Findings:  1. IR port placement right chest    Complications: No immediate complications noted.   Estimated Blood Loss: minimal  Follow-up and Recommendations: 1. Ready for use    Olive Bass, MD  Vascular & Interventional Radiology  05/12/2022 2:46 PM

## 2022-05-12 NOTE — Progress Notes (Signed)
Patient clinically stable post right Port IR placement per Dr Juliette Alcide, tolerated well. Vitals stable pre and post procedure. Denies complaints post procedure. Received Versed 1 mg along with Fentanyl 50 mcg IV for procedure. Report given to [place] Rn post procedure/331

## 2022-05-17 ENCOUNTER — Inpatient Hospital Stay: Payer: PPO

## 2022-05-17 DIAGNOSIS — C20 Malignant neoplasm of rectum: Secondary | ICD-10-CM

## 2022-05-17 MED ORDER — PROCHLORPERAZINE MALEATE 10 MG PO TABS
10.0000 mg | ORAL_TABLET | Freq: Four times a day (QID) | ORAL | 2 refills | Status: DC | PRN
Start: 2022-05-17 — End: 2023-02-19

## 2022-05-17 MED ORDER — LIDOCAINE-PRILOCAINE 2.5-2.5 % EX CREA
1.0000 | TOPICAL_CREAM | CUTANEOUS | 0 refills | Status: DC | PRN
Start: 2022-05-17 — End: 2023-02-19

## 2022-05-17 NOTE — Patient Instructions (Addendum)
[facility] Chemotherapy Teaching    You were referred to Dr. Ellin Saba due to a new diagnosis of rectal cancer.  The typical course of treatment for this cancer is 4 months of traditional chemotherapy, followed by 6 weeks of chemotherapy pills with radiation therapy, followed by surgery. The goal of this treatment is to cure your cancer.   You will be receiving Oxaliplatin, Leucovorin, and Adruicil every two weeks.  You will also go home with a chemotherapy pump for two days.    You will see the doctor regularly throughout treatment.  We will obtain blood work from you prior to every treatment and monitor your results to make sure it is safe to give your treatment. The doctor monitors your response to treatment by the way you are feeling, your blood work, and by obtaining scans periodically.  There will be wait times while you are here for treatment.  It will take about 30 minutes to 1 hour for your lab work to result.  Then there will be wait times while pharmacy mixes your medications.    Medications you will receive in the clinic prior to your chemotherapy medications:  Aloxi:  ALOXI is used in adults to help prevent nausea and vomiting that happens with certain chemotherapy drugs.  Aloxi is a long acting medication, and will remain in your system for about two days.   Emend:  This is an anti-nausea medication that is used with Aloxi to help prevent nausea and vomiting caused by chemotherapy.  Dexamethasone:  This is a steroid given prior to chemotherapy to help prevent allergic reactions; it may also help prevent and control nausea and diarrhea.    [facility]   You will see the doctor regularly throughout treatment.  We will obtain blood work from you prior to every treatment and monitor your results to make sure it is safe to give your treatment. The doctor monitors your response to treatment by the way you are feeling, your blood work, and  by obtaining scans periodically.  There will be wait times while you are here for treatment.  It will take about 30 minutes to 1 hour for your lab work to result.  Then there will be wait times while pharmacy mixes your medications.    Oxaliplatin (Eloxatin)  About This Drug  Oxaliplatin is used to treat cancer. It is given in the vein (IV).  It takes two hours to infuse.  Possible Side Effects   Bone marrow suppression. This is a decrease in the number of white blood cells, red blood cells, and platelets. This may raise your risk of infection, make you tired and weak (fatigue), and raise your risk of bleeding.   Tiredness   Soreness of the mouth and throat. You may have red areas, white patches, or sores that hurt.   Nausea and vomiting (throwing up)   Diarrhea (loose bowel movements)   Changes in your liver function   Effects on the nerves called peripheral neuropathy. You may feel numbness, tingling, or pain in your hands and feet, and may be worse in cold temperatures. It may be hard for you to button your clothes, open jars, or walk as usual. The effect on the nerves may get worse with more doses of the drug. These effects get better in some people after the drug is stopped but it does not get better in all people  Note: Each of the side effects above was reported in 40%  or greater of patients treated with oxaliplatin. Not all possible side effects are included above.   Warnings and Precautions   Allergic reactions, including anaphylaxis, which may be life-threatening are rare but may happen in some patients. Signs of allergic reaction to this drug may be swelling of the face, feeling like your tongue or throat are swelling, trouble breathing, rash, itching, fever, chills, feeling dizzy, and/or feeling that your heart is beating in a fast or not normal way. If this happens, do not take another dose of this drug. You should get urgent medical treatment.   Inflammation  (swelling) of the lungs, which may be life-threatening. You may have a dry cough or trouble breathing.   Effects on the nerves (neuropathy) may resolve within 14 days, or it may persist beyond 14 days.   Severe decrease in white blood cells when combined with the chemotherapy agents 5-fluorouracil and leucovorin. This may be life-threatening.   Severe changes in your liver function   Abnormal heart beat and/or EKG, which can be life-threatening   Rhabdomyolysis- damage to your muscles which may release proteins in your blood and affect how your kidneys work, which can be life-threatening. You may have severe muscle weakness and/or pain, or dark urine.  Important Information   This drug may impair your ability to drive or use machinery. Talk to your doctor and/or nurse about precautions you may need to take.   This drug may be present in the saliva, tears, sweat, urine, stool, vomit, semen, and vaginal secretions. Talk to your doctor and/or your nurse about the necessary precautions to take during this time.  * The effects on the nerves can be aggravated by exposure to cold. Avoid cold beverages, use of ice and make sure you cover your skin and dress warmly prior to being exposed to cold temperatures while you are receiving treatment with oxaliplatin*   Treating Side Effects   Manage tiredness by pacing your activities for the day.   Be sure to include periods of rest between energy-draining activities.   To decrease the risk of infection, wash your hands regularly.   Avoid close contact with people who have a cold, the flu, or other infections.  Take your temperature as your doctor or nurse tells you, and whenever you feel like you may have a fever.   To help decrease the risk of bleeding, use a soft toothbrush. Check with your nurse before using dental floss.   Be very careful when using knives or tools.   Use an electric shaver instead of a razor.   Drink plenty of fluids (a  minimum of eight glasses per day is recommended).   Mouth care is very important. Your mouth care should consist of routine, gentle cleaning of your teeth or dentures and rinsing your mouth with a mixture of 1/2 teaspoon of salt in 8 ounces of water or 1/2 teaspoon of baking soda in 8 ounces of water. This should be done at least after each meal and at bedtime.   If you have mouth sores, avoid mouthwash that has alcohol. Also avoid alcohol and smoking because they can bother your mouth and throat.   To help with nausea and vomiting, eat small, frequent meals instead of three large meals a day. Choose foods and drinks that are at room temperature. Ask your nurse or doctor about other helpful tips and medicine that is available to help stop or lessen these symptoms.   If you throw up or have loose bowel  movements, you should drink more fluids so that you do not become dehydrated (lack of water in the body from losing too much fluid).   If you have diarrhea, eat low-fiber foods that are high in protein and calories and avoid foods that can irritate your digestive tracts or lead to cramping.   Ask your nurse or doctor about medicine that can lessen or stop your diarrhea.   If you have numbness and tingling in your hands and feet, be careful when cooking, walking, and handling sharp objects and hot liquids.   Do not drink cold drinks or use ice in beverages. Drink fluids at room temperature or warmer, and drink through a straw.   Wear gloves to touch cold objects, and wear warm clothing and cover you skin during cold weather.   Food and Drug Interactions   There are no known interactions of oxaliplatin with food and other medications.   This drug may interact with other medicines. Tell your doctor and pharmacist about all the prescription and over-the-counter medicines and dietary supplements (vitamins, minerals, herbs and others) that you are taking at this time. Also, check with your doctor or  pharmacist before starting any new prescription or over-the-counter medicines, or dietary supplements to make sure that there are no interactions   When to Call the Doctor  Call your doctor or nurse if you have any of these symptoms and/or any new or unusual symptoms:   Fever of 100.4 F (38 C) or higher   Chills   Tiredness that interferes with your daily activities   Feeling dizzy or lightheaded   Easy bleeding or bruising   Feeling that your heart is beating in a fast or not normal way (palpitations)   Pain in your chest   Dry cough   Trouble breathing   Pain in your mouth or throat that makes it hard to eat or drink   Nausea that stops you from eating or drinking and/or is not relieved by prescribed medicines   Throwing up   Diarrhea, 4 times in one day or diarrhea with lack of strength or a feeling of being dizzy   Numbness, tingling, or pain in your hands and feet   Signs of possible liver problems: dark urine, pale bowel movements, bad stomach pain, feeling very tired and weak, unusual itching, or yellowing of the eyes or skin   Signs of rhabdomyolysis: decreased urine, very dark urine, muscle pain in the shoulders, thighs, or lower back; muscle weakness or trouble moving arms and legs   Signs of allergic reaction: swelling of the face, feeling like your tongue or throat are swelling, trouble breathing, rash, itching, fever, chills, feeling dizzy, and/or feeling that your heart is beating in a fast or not normal way. If this happens, call 911 for emergency care.   If you think you may be pregnant  Reproduction Warnings   Pregnancy warning: This drug may have harmful effects on the unborn baby. Women of childbearing potential should use effective methods of birth control during your cancer treatment. Let your doctor know right away if you think you may be pregnant or may have impregnated your partner.   Breastfeeding warning: It is not known if this drug passes  into breast milk. For this reason, women should talk to their doctor about the risks and benefits of breastfeeding during treatment with this drug because this drug may enter the breast milk and cause harm to a breastfeeding baby.   Fertility warning: Human fertility  studies have not been done with this drug. Talk with your doctor or nurse if you plan to have children. Ask for information on sperm or egg banking.   Leucovorin Calcium  About This Drug  Leucovorin is a vitamin. It is used in combination with other cancer fighting drugs such as 5-fluorouracil and methotrexate. Leucovorin is given in the vein (IV).  This drug runs at the same time as the oxaliplatin and takes 2 hours to infuse.   Possible Side Effects  Rash and itching  Note: Leucovorin by itself has very few side effects. Other side effects you may have can be caused by the other drugs you are taking, such as 5-fluorouracil.   Warnings and Precautions   Allergic reactions, including anaphylaxis are rare but may happen in some patients. Signs of allergic reaction to this drug may be swelling of the face, feeling like your tongue or throat are swelling, trouble breathing, rash, itching, fever, chills, feeling dizzy, and/or feeling that your heart is beating in a fast or not normal way. If this happens, do not take another dose of this drug. You should get urgent medical treatment.  Food and Drug Interactions   There are no known interactions of leucovorin with food.   This drug may interact with other medicines. Tell your doctor and pharmacist about all the prescription and over-the-counter medicines and dietary supplements (vitamins, minerals, herbs and others) that you are taking at this time.   Also, check with your doctor or pharmacist before starting any new prescription or over-the-counter medicines, or dietary supplements to make sure that there are no interactions.   When to Call the Doctor  Call your doctor or  nurse if you have any of these symptoms and/or any new or unusual symptoms:   A new rash or a rash that is not relieved by prescribed medicines   Signs of allergic reaction: swelling of the face, feeling like your tongue or throat are swelling, trouble breathing, rash, itching, fever, chills, feeling dizzy, and/or feeling that your heart is beating in a fast or not normal way. If this happens, call 911 for emergency care.   If you think you may be pregnant   Reproduction Warnings   Pregnancy warning: It is not known if this drug may harm an unborn child. For this reason, be sure to talk with your doctor if you are pregnant or planning to become pregnant while receiving this drug. Let your doctor know right away if you think you may be pregnant   Breastfeeding warning: It is not known if this drug passes into breast milk. For this reason, women should talk to their doctor about the risks and benefits of breastfeeding during treatment with this drug because this drug may enter the breast milk and cause harm to a breastfeeding baby.   Fertility warning: Human fertility studies have not been done with this drug. Talk with your doctor or nurse if you plan to have children. Ask for information on sperm or egg banking.   5-Fluorouracil (Adrucil; 5FU)  About This Drug  Fluorouracil is used to treat cancer. It is given in the vein (IV). It is given as an IV push from a syringe and also as a continuous infusion given via an ambulatory pump (a pump you take home and wear for a specified amount of time).  Possible Side Effects   Bone marrow suppression. This is a decrease in the number of white blood cells, red blood cells, and platelets. This  may raise your risk of infection, make you tired and weak (fatigue), and raise your risk of bleeding   Changes in the tissue of the heart and/or heart attack. Some changes may happen that can cause your heart to have less ability to pump blood.   Blurred  vision or other changes in eyesight   Nausea and throwing up (vomiting)   Diarrhea (loose bowel movements)   Ulcers - sores that may cause pain or bleeding in your digestive tract, which includes your mouth, esophagus, stomach, small/large intestines and rectum   Soreness of the mouth and throat. You may have red areas, white patches, or sores that hurt.   Allergic reactions, including anaphylaxis are rare but may happen in some patients. Signs of allergic reaction to this drug may be swelling of the face, feeling like your tongue or throat are swelling, trouble breathing, rash, itching, fever, chills, feeling dizzy, and/or feeling that your heart is beating in a fast or not normal way. If this happens, do not take another dose of this drug. You should get urgent medical treatment.   Sensitivity to light (photosensitivity). Photosensitivity means that you may become more sensitive to the sun and/or light. You may get a skin rash/reaction if you are in the sun or are exposed to sun lamps and tanning beds. Your eyes may water more, mostly in bright light.   Changes in your nail color, nail loss and/or brittle nail   Darkening of the skin, or changes to the color of your skin and/or veins used for infusion   Rash, dry skin, or itching  Note: Not all possible side effects are included above.  Warnings and Precautions   Hand-and-foot syndrome. The palms of your hands or soles of your feet may tingle, become numb, painful, swollen, or red.   Changes in your central nervous system can happen. The central nervous system is made up of your brain and spinal cord. You could feel extreme tiredness, agitation, confusion, hallucinations (see or hear things that are not there), trouble understanding or speaking, loss of control of your bowels or bladder, eyesight changes, numbness or lack of strength to your arms, legs, face, or body, or coma. If you start to have any of these symptoms let your doctor  know right away.   Side effects of this drug may be unexpectedly severe in some patients  Note: Some of the side effects above are very rare. If you have concerns and/or questions, please discuss them with your medical team.   Important Information   This drug may be present in the saliva, tears, sweat, urine, stool, vomit, semen, and vaginal secretions. Talk to your doctor and/or your nurse about the necessary precautions to take during this time.   Treating Side Effects   Manage tiredness by pacing your activities for the day.   Be sure to include periods of rest between energy-draining activities.   To help decrease the risk of infections, wash your hands regularly.   Avoid close contact with people who have a cold, the flu, or other infections.   Take your temperature as your doctor or nurse tells you, and whenever you feel like you may have a fever.   Use a soft toothbrush. Check with your nurse before using dental floss.   Be very careful when using knives or tools.   Use an electric shaver instead of a razor.   If you have a nose bleed, sit with your head tipped slightly forward. Apply pressure  by lightly pinching the bridge of your nose between your thumb and forefinger. Call your doctor if you feel dizzy or faint or if the bleeding doesn't stop after 10 to 15 minutes.   Drink plenty of fluids (a minimum of eight glasses per day is recommended).   If you throw up or have loose bowel movements, you should drink more fluids so that you do not  become dehydrated (lack of water in the body from losing too much fluid).   To help with nausea and vomiting, eat small, frequent meals instead of three large meals a day. Choose foods and drinks that are at room temperature. Ask your nurse or doctor about other helpful tips and medicine that is available to help, stop, or lessen these symptoms.   If you have diarrhea, eat low-fiber foods that are high in protein and calories and  avoid foods that can irritate your digestive tracts or lead to cramping.   Ask your nurse or doctor about medicine that can lessen or stop your diarrhea.   Mouth care is very important. Your mouth care should consist of routine, gentle cleaning of your teeth or dentures and rinsing your mouth with a mixture of 1/2 teaspoon of salt in 8 ounces of water or 1/2 teaspoon of baking soda in 8 ounces of water. This should be done at least after each meal and at bedtime.   If you have mouth sores, avoid mouthwash that has alcohol. Also avoid alcohol and smoking because they can bother your mouth and throat.   Keeping your nails moisturized may help with brittleness.   To help with itching, moisturize your skin several times day.   Use sunscreen with SPF 30 or higher when you are outdoors even for a short time. Cover up when you are out in the sun. Wear wide-brimmed hats, long-sleeved shirts, and pants. Keep your neck, chest, and back covered. Wear dark sun glasses when in the sun or bright lights.   If you get a rash do not put anything on it unless your doctor or nurse says you may. Keep the area around the rash clean and dry. Ask your doctor for medicine if your rash bothers you.   Keeping your pain under control is important to your well-being. Please tell your doctor or nurse if you are experiencing pain.   Food and Drug Interactions   There are no known interactions of fluorouracil with food.   Check with your doctor or pharmacist about all other prescription medicines and over-the-counter medicines and dietary supplements (vitamins, minerals, herbs and others) you are taking before starting this medicine as there are known drug interactions with 5-fluoroucacil. Also, check with your doctor or pharmacist before starting any new prescription or over-the-counter medicines, or dietary supplements to make sure that there are no interactions.  When to Call the Doctor  Call your doctor or nurse if  you have any of these symptoms and/or any new or unusual symptoms:   Fever of 100.4 F (38 C) or higher   Chills   Easy bleeding or bruising   Nose bleed that doesn't stop bleeding after 10-15 minutes   Trouble breathing   Feeling dizzy or lightheaded   Feeling that your heart is beating in a fast or not normal way (palpitations)   Chest pain or symptoms of a heart attack. Most heart attacks involve pain in the center of the chest that lasts more than a few minutes. The pain may go away and come  back or it can be constant. It can feel like pressure, squeezing, fullness, or pain. Sometimes pain is felt in one or both arms, the back, neck, jaw, or stomach. If any of these symptoms last 2 minutes, call 911.   Confusion and/or agitation   Hallucinations   Trouble understanding or speaking   Loss of control of bowels or bladder   Blurry vision or changes in your eyesight   Headache that does not go away   Numbness or lack of strength to your arms, legs, face, or body   Nausea that stops you from eating or drinking and/or is not relieved by prescribed medicines   Throwing up more than 3 times a day   Diarrhea, 4 times in one day or diarrhea with lack of strength or a feeling of being dizzy   Pain in your mouth or throat that makes it hard to eat or drink   Pain along the digestive tract - especially if worse after eating   Blood in your vomit (bright red or coffee-ground) and/or stools (bright red, or black/tarry)   Coughing up blood   Tiredness that interferes with your daily activities   Painful, red, or swollen areas on your hands or feet or around your nails   A new rash or a rash that is not relieved by prescribed medicines   Develop sensitivity to sunlight/light   Numbness and/or tingling of your hands and/or feet   Signs of allergic reaction: swelling of the face, feeling like your tongue or throat are swelling, trouble breathing, rash, itching, fever,  chills, feeling dizzy, and/or feeling that your heart is beating in a fast or not normal way. If this happens, call 911 for emergency care.   If you think you are pregnant or may have impregnated your partner  Reproduction Warnings   Pregnancy warning: This drug may have harmful effects on the unborn baby. Women of child bearing potential should use effective methods of birth control during your cancer treatment and 3 months after treatment. Men with male partners of childbearing potential should use effective methods of birth control during your cancer treatment and for 3 months after your cancer treatment. Let your doctor know right away if you think you may be pregnant or may have impregnated your partner.   Breastfeeding warning: It is not known if this drug passes into breast milk. For this reason, Women should not breastfeed during treatment because this drug could enter the breast milk and cause harm to a breastfeeding baby.   Fertility warning: In men and women both, this drug may affect your ability to have children in the future. Talk with your doctor or nurse if you plan to have children. Ask for information on sperm or egg banking.   SELF CARE ACTIVITIES WHILE RECEIVING CHEMOTHERAPY:  Hydration Increase your fluid intake 48 hours prior to treatment and drink at least 8 to 12 cups (64 ounces) of water/decaffeinated beverages per day after treatment. You can still have your cup of coffee or soda but these beverages do not count as part of your 8 to 12 cups that you need to drink daily. No alcohol intake.  Medications Continue taking your normal prescription medication as prescribed.  If you start any new herbal or new supplements please let us know first to make sure it is safe.  Mouth Care Have teeth cleaned professionally before starting treatment. Keep dentures and partial plates clean. Use soft toothbrush and do not use mouthwashes that contain alcohol.  Biotene is a good  mouthwash that is available at most pharmacies or may be ordered by calling [phone]. Use warm salt water gargles (1 teaspoon salt per 1 quart warm water) before and after meals and at bedtime. If you need dental work, please let the doctor know before you go for your appointment so that we can coordinate the best possible time for you in regards to your chemo regimen. You need to also let your dentist know that you are actively taking chemo. We may need to do labs prior to your dental appointment.  Skin Care Always use sunscreen that has not expired and with SPF (Sun Protection Factor) of 50 or higher. Wear hats to protect your head from the sun. Remember to use sunscreen on your hands, ears, face, & feet.  Use good moisturizing lotions such as udder cream, eucerin, or even Vaseline. Some chemotherapies can cause dry skin, color changes in your skin and nails.    Avoid long, hot showers or baths. Use gentle, fragrance-free soaps and laundry detergent. Use moisturizers, preferably creams or ointments rather than lotions because the thicker consistency is better at preventing skin dehydration. Apply the cream or ointment within 15 minutes of showering. Reapply moisturizer at night, and moisturize your hands every time after you wash them.  Hair Loss (if your doctor says your hair will fall out)  If your doctor says that your hair is likely to fall out, decide before you begin chemo whether you want to wear a wig. You may want to shop before treatment to match your hair color. Hats, turbans, and scarves can also camouflage hair loss, although some people prefer to leave their heads uncovered. If you go bare-headed outdoors, be sure to use sunscreen on your scalp. Cut your hair short. It eases the inconvenience of shedding lots of hair, but it also can reduce the emotional impact of watching your hair fall out. Don't perm or color your hair during chemotherapy. Those chemical treatments are already  damaging to hair and can enhance hair loss. Once your chemo treatments are done and your hair has grown back, it's OK to resume dyeing or perming hair.  With chemotherapy, hair loss is almost always temporary. But when it grows back, it may be a different color or texture. In older adults who still had hair color before chemotherapy, the new growth may be completely gray.  Often, new hair is very fine and soft.  Infection Prevention Please wash your hands for at least 30 seconds using warm soapy water. Handwashing is the #1 way to prevent the spread of germs. Stay away from sick people or people who are getting over a cold. If you develop respiratory systems such as green/yellow mucus production or productive cough or persistent cough let us know and we will see if you need an antibiotic. It is a good idea to keep a pair of gloves on when going into grocery stores/[employer] to decrease your risk of coming into contact with germs on the carts, etc. Carry alcohol hand gel with you at all times and use it frequently if out in public. If your temperature reaches 100.5 or higher please call the clinic and let us know.  If it is after hours or on the weekend please go to the ER if your temperature is over 100.5.  Please have your own personal thermometer at home to use.    Sex and bodily fluids If you are going to have sex, a condom must  be used to protect the person that isn't taking chemotherapy. Chemo can decrease your libido (sex drive). For a few days after chemotherapy, chemotherapy can be excreted through your bodily fluids.  When using the toilet please close the lid and flush the toilet twice.  Do this for a few day after you have had chemotherapy.   Effects of chemotherapy on your sex life Some changes are simple and won't last long. They won't affect your sex life permanently.  Sometimes you may feel: too tired not strong enough to be very active sick or sore  not in the mood anxious or  low Your anxiety might not seem related to sex. For example, you may be worried about the cancer and how your treatment is going. Or you may be worried about money, or about how you family are coping with your illness.  These things can cause stress, which can affect your interest in sex. It's important to talk to your partner about how you feel.  Remember - the changes to your sex life don't usually last long. There's usually no medical reason to stop having sex during chemo. The drugs won't have any long term physical effects on your performance or enjoyment of sex. Cancer can't be passed on to your partner during sex  Contraception It's important to use reliable contraception during treatment. Avoid getting pregnant while you or your partner are having chemotherapy. This is because the drugs may harm the baby. Sometimes chemotherapy drugs can leave a man or woman infertile.  This means you would not be able to have children in the future. You might want to talk to someone about permanent infertility. It can be very difficult to learn that you may no longer be able to have children. Some people find counselling helpful. There might be ways to preserve your fertility, although this is easier for men than for women. You may want to speak to a fertility expert. You can talk about sperm banking or harvesting your eggs. You can also ask about other fertility options, such as donor eggs. If you have or have had breast cancer, your doctor might advise you not to take the contraceptive pill. This is because the hormones in it might affect the cancer. It is not known for sure whether or not chemotherapy drugs can be passed on through semen or secretions from the vagina. Because of this some doctors advise people to use a barrier method if you have sex during treatment. This applies to vaginal, anal or oral sex. Generally, doctors advise a barrier method only for the time you are actually having the treatment and for  about a week after your treatment. Advice like this can be worrying, but this does not mean that you have to avoid being intimate with your partner. You can still have close contact with your partner and continue to enjoy sex.  Animals If you have cats or birds we just ask that you not change the litter or change the cage.  Please have someone else do this for you while you are on chemotherapy.   Food Safety During and After Cancer Treatment Food safety is important for people both during and after cancer treatment. Cancer and cancer treatments, such as chemotherapy, radiation therapy, and stem cell/bone marrow transplantation, often weaken the immune system. This makes it harder for your body to protect itself from foodborne illness, also called food poisoning. Foodborne illness is caused by eating food that contains harmful bacteria, parasites, or viruses.  Foods to avoid Some foods have a higher risk of becoming tainted with bacteria. These include: Unwashed fresh fruit and vegetables, especially leafy vegetables that can hide dirt and other contaminants Raw sprouts, such as alfalfa sprouts Raw or undercooked beef, especially ground beef, or other raw or undercooked meat and poultry Fatty, fried, or spicy foods immediately before or after treatment.  These can sit heavy on your stomach and make you feel nauseous. Raw or undercooked shellfish, such as oysters. Sushi and sashimi, which often contain raw fish.  Unpasteurized beverages, such as unpasteurized fruit juices, raw milk, raw yogurt, or cider Undercooked eggs, such as soft boiled, over easy, and poached; raw, unpasteurized eggs; or foods made with raw egg, such as homemade raw cookie dough and homemade mayonnaise  Simple steps for food safety  Shop smart. Do not buy food stored or displayed in an unclean area. Do not buy bruised or damaged fruits or vegetables. Do not buy cans that have cracks, dents, or bulges. Pick up foods that  can spoil at the end of your shopping trip and store them in a cooler on the way home.  Prepare and clean up foods carefully. Rinse all fresh fruits and vegetables under running water, and dry them with a clean towel or paper towel. Clean the top of cans before opening them. After preparing food, wash your hands for 20 seconds with hot water and soap. Pay special attention to areas between fingers and under nails. Clean your utensils and dishes with hot water and soap. Disinfect your kitchen and cutting boards using 1 teaspoon of liquid, unscented bleach mixed into 1 quart of water.    Dispose of old food. Eat canned and packaged food before its expiration date (the "use by" or "best before" date). Consume refrigerated leftovers within 3 to 4 days. After that time, throw out the food. Even if the food does not smell or look spoiled, it still may be unsafe. Some bacteria, such as Listeria, can grow even on foods stored in the refrigerator if they are kept for too long.  Take precautions when eating out. At restaurants, avoid buffets and salad bars where food sits out for a long time and comes in contact with many people. Food can become contaminated when someone with a virus, often a norovirus, or another "bug" handles it. Put any leftover food in a "to-go" container yourself, rather than having the server do it. And, refrigerate leftovers as soon as you get home. Choose restaurants that are clean and that are willing to prepare your food as you order it cooked.   AT HOME MEDICATIONS:  Compazine/Prochlorperazine 10mg  tablet. Take 1 tablet every 6 hours as needed for nausea/vomiting. (This can make you sleepy)   EMLA cream. Apply a quarter size amount to port site 1 hour prior to chemo. Do not rub in. Cover with plastic  wrap.    Diarrhea Sheet   If you are having loose stools/diarrhea, please purchase Imodium and begin taking as outlined:  At the first sign of poorly formed or loose stools you should begin taking Imodium (loperamide) 2 mg capsules.  Take two tablets (4mg ) followed by one tablet (2mg ) every 2 hours - DO NOT EXCEED 8 tablets in 24 hours.  If it is bedtime and you are having loose stools, take 2 tablets at bedtime, then 2 tablets every 4 hours until morning.   Always call the Cancer Center if you are having loose stools/diarrhea that you can't get under control.  Loose stools/diarrhea leads to dehydration (loss of water) in your body.  We have other options of trying to get the loose stools/diarrhea to stop but you must let us know!   Constipation Sheet  Colace - 100 mg capsules - take 2 capsules daily.  If this doesn't help then you can increase to 2 capsules twice daily.  Please call if the above does not work for you. Do not go more than 2 days without a bowel movement.  It is very important that you do not become constipated.  It will make you feel sick to your stomach (nausea) and can cause abdominal pain and vomiting.  Nausea Sheet   Compazine/Prochlorperazine 10mg  tablet. Take 1 tablet every 6 hours as needed for nausea/vomiting (This can make you drowsy).  If you are having persistent nausea (nausea that does not stop) please call the Cancer Center and let us know the amount of nausea that you are experiencing.  If you begin to vomit, you need to call the Cancer Center and if it is the weekend and you have vomited more than one time and can't get it to stop-go to the Emergency Room.  Persistent nausea/vomiting can lead to dehydration (loss of fluid in your body) and will make you feel very weak and unwell. Ice chips, sips of clear liquids, foods that are at room temperature, crackers, and toast tend to be better tolerated.   SYMPTOMS TO REPORT AS SOON AS POSSIBLE AFTER TREATMENT:  FEVER  GREATER THAN 100.5 F  CHILLS WITH OR WITHOUT FEVER  NAUSEA AND VOMITING THAT IS NOT CONTROLLED WITH YOUR NAUSEA MEDICATION  UNUSUAL SHORTNESS OF BREATH  UNUSUAL BRUISING OR BLEEDING  TENDERNESS IN MOUTH AND THROAT WITH OR WITHOUT   PRESENCE OF ULCERS  URINARY PROBLEMS  BOWEL PROBLEMS  UNUSUAL RASH      Wear comfortable clothing and clothing appropriate for easy access to any Portacath or PICC line. Let us know if there is anything that we can do to make your therapy better!    What to do if you need assistance after hours or on the weekends: CALL [phone].  HOLD on the line, do not hang up.  You will hear multiple messages but at the end you will be connected with a nurse triage line.  They will contact the doctor if necessary.  Most of the time they will be able to assist you.  Do not call the hospital operator.      I have been informed and understand all of the instructions given to me and have received a copy. I have been instructed to call  the clinic [phone] or my family physician as soon as possible for continued medical care, if indicated. I do not have any more questions at this time but understand that I may call the [facility] or the Patient Navigator at [phone] during office hours should I have questions or need assistance in obtaining follow-up care.

## 2022-05-17 NOTE — Progress Notes (Signed)

## 2022-05-18 ENCOUNTER — Inpatient Hospital Stay (HOSPITAL_COMMUNITY): Admission: RE | Admit: 2022-05-18 | Payer: PPO | Source: Ambulatory Visit

## 2022-05-22 NOTE — Progress Notes (Signed)
[facility] [address], [place] [address]   Clinic Day:  05/23/2022  Referring physician: [facility]  Patient Care Team: [place] as PCP - General Therese Sarah, RN as Oncology Nurse Navigator (Medical Oncology) Doreatha Massed, MD as Medical Oncologist (Medical Oncology)   ASSESSMENT & PLAN:   Assessment:  1.  Stage II (T3 N0 M0) low rectal adenocarcinoma, MMR preserved: - 42-month history of intermittent rectal bleeding.  No weight loss. - Colonoscopy (04/26/2022): Large cauliflower mass palpated in the distal rectum on DRE.  Scattered diverticula in the descending colon.  5 mm polyp in the ascending colon.  In the rectum, exophytic semilunar neoplastic appearing process beginning at the anal verge and extending proximally about 6 cm. - Pathology (04/26/2022): Invasive moderately differentiated adenocarcinoma of the rectal mass.  MMR preserved. - CT CAP (05/01/2022): Nodular wall thickening along the rectum.  No intrinsic abnormal lymph nodes.  No liver lesion.  Fatty liver.  Nodule along the left side of the urinary bladder.  Small mass along the course of the distal left ureter.  Worrisome for multifocal TCC.  Small lung nodules measuring up to 4 mm. - MRI pelvis (05/03/2022): 6.4 cm left lateral low rectal tumor abutting the internal anal sphincter, T3c N0.  Distance from tumor to the internal anal sphincter is 0 cm.  Tumor measures 6.4 cm in length and up to 2.2 cm in thickness. - PET scan (05/11/2022): No evidence of metastatic disease. - Met with Dr. Maisie Fus on 05/16/2022: She is in agreement with TNT and has discussed APR with colostomy.  2.  Social/family history: -He lives at home and is independent of ADLs and IADLs.  He is accompanied by his son today.  Worked at [employer] prior to retirement.  Also served in [place] but denied any exposure to agent orange.  Quit smoking 1 year ago.  Smoked 1 to 2 packs/day  for the last 61 years.  Started at age 11. - No family history of malignancies.  Plan:  1.  Stage II (T3 cN0 M0) low rectal adenocarcinoma, MMR preserved: - He has met with Dr. Maisie Fus.  TNT followed by APR with permanent colostomy was discussed. - We reviewed results of PET scan which did not show any evidence of metastatic disease.  5 mm left posterior perirectal lymph node with no FDG uptake but too small to characterize. - We discussed treatment plan with TNT with chemoradiation therapy with Xeloda followed by chemotherapy for 4 months followed by surgery.  We will ask Dr. Maisie Fus if it is okay to do chemoradiation therapy first.  If not we will do chemotherapy followed by chemoradiation. - We also talked about Xeloda 1500 mg twice daily Monday through Friday throughout the course of radiation.  We discussed side effects of Xeloda and including but not limited to hand-foot skin reaction, mucositis, diarrhea, decreased blood counts among others.  2.  Left urinary bladder mass and distal left ureteral mass: - Reviewed CT urogram (05/09/2022): 1.2 cm focal enhancing soft tissue nodule along the left lateral bladder wall suspicious for bladder cancer. - PET scan (05/11/2022): No evidence of ureteral lesions. - Recommend consultation with urology for cystoscopy and biopsy.   No orders of the defined types were placed in this encounter.    I,James Miles,acting as a Neurosurgeon for Doreatha Massed, MD.,have documented all relevant documentation on the behalf of Doreatha Massed, MD,as directed by  Doreatha Massed, MD while  in the presence of Doreatha Massed, MD.  I, Doreatha Massed MD, have reviewed the above documentation for accuracy and completeness, and I agree with the above.    Doreatha Massed, MD   4/16/20245:14 PM  CHIEF COMPLAINT/PURPOSE OF CONSULT:   Diagnosis: Rectal adenocarcinoma   Cancer Staging  Rectal adenocarcinoma Staging form: Colon and Rectum, AJCC  8th Edition - Clinical stage from 05/07/2022: Stage IIA (cT3, cN0, cM0) - Unsigned    Prior Therapy: None  Current Therapy: Chemoradiation followed by surgery  HISTORY OF PRESENT ILLNESS:   Oncology History   No history exists.    James Miles is a 77 y.o. male presenting to clinic today for evaluation of rectal cancer at the request of Dr. Jena Gauss.  He presented to his PCP with a six month h/o rectal bleeding. His last colonoscopy in 07/2005 showed hyperplastic polyps, and he did not return for repeat in 10 years as recommended. However, a Cologuard test in 10/2018 was negative. He was referred to Brooke Bonito, NP in GI on 04/18/22, DRE was benign with no overt blood and small amount of hemorrhoid tissue felt. He proceeded to colonoscopy on 04/26/22 with Dr. Jena Gauss, which revealed a bulky rectal neoplasm beginning at the anal verge and extending about 6 cm proximally. Pathology from the procedure confirmed invasive moderately differentiated adenocarcinoma, MMR normal. A CEA obtained the same day was WNL at 2.6.  He underwent staging CT C/A/P on 05/01/22 showing: nodular wall thickening along rectum with adjacent vascular engorgement; no abnormal lymph node enlargement or liver lesion. Additionally, incidental findings include a nodule along the left side of the urinary bladder and a small mass along the course of the distal left ureter, worrisome for multifocal TCC, and small lung nodules measuring up to 4 mm.  He also underwent staging pelvis MRI on 05/03/22 showing: 6.4 cm left lateral low rectal tumor abutting internal anal sphincter; stage T3c, N0.  Of note, he is not yet scheduled to see a colorectal surgeon.  Today, he states that he is doing well overall. His appetite level is at 100%. His energy level is at 75%.  PAST MEDICAL HISTORY:   James Miles is a 77 y.o. male presenting to clinic today or follow up of rectal cancer. He was last seen by me on 05/08/22.  Today, he states that he is  doing well overall. His appetite level is at 100%. His energy level is at 75%.  He has occasional rectal bleeding when he has a bowel movement.  PAST MEDICAL HISTORY:   Past Medical History: Past Medical History:  Diagnosis Date   Back pain    COPD (chronic obstructive pulmonary disease)    Diabetes mellitus without complication    Hyperlipidemia     Surgical History: Past Surgical History:  Procedure Laterality Date   BIOPSY  04/26/2022   Procedure: BIOPSY;  Surgeon: Corbin Ade, MD;  [facility]: [facility];  Service: Endoscopy;;   COLONOSCOPY WITH PROPOFOL N/A 04/26/2022   Procedure: COLONOSCOPY WITH PROPOFOL;  Surgeon: Corbin Ade, MD;  Location: [facility];  Service: Endoscopy;  Laterality: N/A;  10:00am; ASA 3   hemorhoidectomy     IR IMAGING GUIDED PORT INSERTION  05/12/2022   POLYPECTOMY  04/26/2022   Procedure: POLYPECTOMY;  Surgeon: Corbin Ade, MD;  Location: [facility];  Service: Endoscopy;;    Social History: Social History   Socioeconomic History   Marital status: Widowed    Spouse name: Not on file  Number of children: 2   Years of education: Not on file   Highest education level: Not on file  Occupational History   Not on file  Tobacco Use   Smoking status: Former    Years: 50    Types: Cigarettes    Quit date: 08/2016    Years since quitting: 5.7   Smokeless tobacco: Never  Vaping Use   Vaping Use: Never used  Substance and Sexual Activity   Alcohol use: Not on file   Drug use: Never   Sexual activity: Not Currently  Other Topics Concern   Not on file  Social History Narrative   Not on file   Social Determinants of Health   Financial Resource Strain: Not on file  Food Insecurity: No Food Insecurity (05/08/2022)   Hunger Vital Sign    Worried About Running Out of Food in the Last Year: Never true    Ran Out of Food in the Last Year: Never true  Transportation Needs: No Transportation Needs (05/08/2022)   PRAPARE - Therapist, art (Medical): No    Lack of Transportation (Non-Medical): No  Physical Activity: Not on file  Stress: Not on file  Social Connections: Not on file  Intimate Partner Violence: Not At Risk (05/08/2022)   Humiliation, Afraid, Rape, and Kick questionnaire    Fear of Current or Ex-Partner: No    Emotionally Abused: No    Physically Abused: No    Sexually Abused: No    Family History: No family history on file.  Current Medications:  Current Outpatient Medications:    acidophilus (RISAQUAD) CAPS capsule, Take 1 capsule by mouth daily., Disp: , Rfl:    albuterol (VENTOLIN HFA) 108 (90 Base) MCG/ACT inhaler, Inhale 2 puffs into the lungs every 6 (six) hours as needed for wheezing or shortness of breath., Disp: , Rfl:    allopurinol (ZYLOPRIM) 300 MG tablet, Take 300 mg by mouth daily., Disp: , Rfl:    atorvastatin (LIPITOR) 20 MG tablet, Take 20 mg by mouth daily., Disp: , Rfl:    capecitabine (XELODA) 500 MG tablet, Take 3 tablets (1,500 mg total) by mouth 2 (two) times daily after a meal., Disp: 180 tablet, Rfl: 0   dextrose 5 % SOLN 1,000 mL with fluorouracil 5 GM/100ML SOLN, Inject into the vein., Disp: , Rfl:    JARDIANCE 10 MG TABS tablet, Take 20 mg by mouth daily., Disp: , Rfl:    LEUCOVORIN CALCIUM IV, Inject into the vein., Disp: , Rfl:    lidocaine-prilocaine (EMLA) cream, Apply 1 Application topically as needed., Disp: 30 g, Rfl: 0   lisinopril (ZESTRIL) 20 MG tablet, Take 20 mg by mouth daily., Disp: , Rfl:    loperamide (IMODIUM) 2 MG capsule, Take 2 mg by mouth 3 (three) times daily as needed for diarrhea or loose stools., Disp: , Rfl:    MELATONIN PO, Take 1 tablet by mouth at bedtime as needed (sleep)., Disp: , Rfl:    OXALIPLATIN IV, Inject into the vein., Disp: , Rfl:    prochlorperazine (COMPAZINE) 10 MG tablet, Take 1 tablet (10 mg total) by mouth every 6 (six) hours as needed for nausea or vomiting., Disp: 30 tablet, Rfl: 2   Allergies: No Known  Allergies  REVIEW OF SYSTEMS:   Review of Systems  Constitutional:  Negative for chills, fatigue and fever.  HENT:   Negative for lump/mass, mouth sores, nosebleeds, sore throat and trouble swallowing.   Eyes:  Negative  for eye problems.  Respiratory:  Positive for shortness of breath. Negative for cough.   Cardiovascular:  Negative for chest pain, leg swelling and palpitations.  Gastrointestinal:  Positive for blood in stool. Negative for abdominal pain, constipation, diarrhea, nausea and vomiting.  Genitourinary:  Negative for bladder incontinence, difficulty urinating, dysuria, frequency, hematuria and nocturia.   Musculoskeletal:  Negative for arthralgias, back pain, flank pain, myalgias and neck pain.  Skin:  Negative for itching and rash.  Neurological:  Negative for dizziness, headaches and numbness.  Hematological:  Does not bruise/bleed easily.  Psychiatric/Behavioral:  Negative for depression, sleep disturbance and suicidal ideas. The patient is not nervous/anxious.   All other systems reviewed and are negative.    VITALS:   Blood pressure 115/72, pulse 66, temperature 98.4 F (36.9 C), resp. rate 18, height 5\' 11"  (1.803 m), weight 176 lb 14.4 oz (80.2 kg), SpO2 95 %.  Wt Readings from Last 3 Encounters:  05/23/22 176 lb 14.4 oz (80.2 kg)  05/12/22 176 lb (79.8 kg)  05/08/22 177 lb (80.3 kg)    Body mass index is 24.67 kg/m.  Performance status (ECOG): 1 - Symptomatic but completely ambulatory  PHYSICAL EXAM:   Physical Exam Vitals and nursing note reviewed. Exam conducted with a chaperone present.  Constitutional:      Appearance: Normal appearance.  Cardiovascular:     Rate and Rhythm: Normal rate and regular rhythm.     Pulses: Normal pulses.     Heart sounds: Normal heart sounds.  Pulmonary:     Effort: Pulmonary effort is normal.     Breath sounds: Normal breath sounds.  Abdominal:     Palpations: Abdomen is soft. There is no hepatomegaly, splenomegaly  or mass.     Tenderness: There is no abdominal tenderness.  Musculoskeletal:     Right lower leg: No edema.     Left lower leg: No edema.  Lymphadenopathy:     Cervical: No cervical adenopathy.     Right cervical: No superficial, deep or posterior cervical adenopathy.    Left cervical: No superficial, deep or posterior cervical adenopathy.     Upper Body:     Right upper body: No supraclavicular or axillary adenopathy.     Left upper body: No supraclavicular or axillary adenopathy.  Neurological:     General: No focal deficit present.     Mental Status: He is alert and oriented to person, place, and time.  Psychiatric:        Mood and Affect: Mood normal.        Behavior: Behavior normal.     LABS:      Latest Ref Rng & Units 04/26/2022   12:55 PM 10/03/2018    1:43 AM  CBC  WBC 4.0 - 10.5 K/uL 8.9  9.2      Hemoglobin 13.0 - 17.0 g/dL 16.1  09.6      Hematocrit 39.0 - 52.0 % 48.4  50      Platelets 150 - 400 K/uL 201  216         This result is from an external source.      Latest Ref Rng & Units 04/26/2022   12:55 PM 04/21/2022   10:01 AM 04/10/2019    8:57 AM  CMP  Glucose 70 - 99 mg/dL 045  409  811   BUN 8 - 23 mg/dL 7  13  12    Creatinine 0.61 - 1.24 mg/dL 9.14  7.82  9.56   Sodium  135 - 145 mmol/L 135  140  140   Potassium 3.5 - 5.1 mmol/L 3.5  3.5  4.0   Chloride 98 - 111 mmol/L 102  105  101   CO2 22 - 32 mmol/L 26  24  30    Calcium 8.9 - 10.3 mg/dL 8.4  9.1  9.4   Total Protein 6.5 - 8.1 g/dL 7.0   6.8   Total Bilirubin 0.3 - 1.2 mg/dL 0.5   0.6   Alkaline Phos 38 - 126 U/L 69     AST 15 - 41 U/L 21   13   ALT 0 - 44 U/L 26   16      Lab Results  Component Value Date   CEA1 2.6 04/26/2022   /  CEA  Date Value Ref Range Status  04/26/2022 2.6 0.0 - 4.7 ng/mL Final    Comment:    (NOTE)                             Nonsmokers          <3.9                             Smokers             <5.6 [employer] Electrochemiluminescence  Immunoassay (ECLIA) Values obtained with different assay methods or kits cannot be used interchangeably.  Results cannot be interpreted as absolute evidence of the presence or absence of malignant disease. Performed At: [facility] [address] [place], [place] [id number] Jolene Schimke MD YN:[id number]    No results found for: "PSA1" No results found for: [phone]" No results found for: "CAN125"  No results found for: "TOTALPROTELP", "ALBUMINELP", "A1GS", "A2GS", "BETS", "BETA2SER", "GAMS", "MSPIKE", "SPEI" No results found for: "TIBC", "FERRITIN", "IRONPCTSAT" No results found for: "LDH"   STUDIES:   IR IMAGING GUIDED PORT INSERTION  Result Date: 05/12/2022 INDICATION: chemotherapy administration EXAM: Port placement using ultrasound and fluoroscopic guidance MEDICATIONS: Documented in the EMR ANESTHESIA/SEDATION: Moderate (conscious) sedation was employed during this procedure. A total of Versed 1 mg and Fentanyl 50 mcg was administered intravenously. Moderate Sedation Time: 24 minutes. The patient's level of consciousness and vital signs were monitored continuously by radiology nursing throughout the procedure under my direct supervision. FLUOROSCOPY TIME:  Fluoroscopy Time: 0.3 minutes (<1 mGy) COMPLICATIONS: None immediate. PROCEDURE: Informed written consent was obtained from the patient after a thorough discussion of the procedural risks, benefits and alternatives. All questions were addressed. Maximal Sterile Barrier Technique was utilized including caps, mask, sterile gowns, sterile gloves, sterile drape, hand hygiene and skin antiseptic. A timeout was performed prior to the initiation of the procedure. The patient was placed supine on the exam table. The right neck and chest was prepped and draped in the standard sterile fashion. A preliminary ultrasound of the right neck was performed and demonstrates a patent right internal jugular vein. A permanent ultrasound image was  stored in the electronic medical record. The overlying skin was anesthetized with 1% Lidocaine. Using ultrasound guidance, access was obtained into the right internal jugular vein using a 21 gauge micropuncture set. A wire was advanced into the SVC, a short incision was made at the puncture site, and serial dilatation performed. Next, in an ipsilateral infraclavicular location, an incision was made at the site of the subcutaneous reservoir. Blunt dissection was used to open a pocket to  contain the reservoir. A subcutaneous tunnel was then created from the port site to the puncture site. A(n) 8 Fr single lumen catheter was advanced through the tunnel. The catheter was attached to the port and this was placed in the subcutaneous pocket. Under fluoroscopic guidance, a peel away sheath was placed, and the catheter was trimmed to the appropriate length and was advanced into the central veins. The catheter length is 25 cm. The tip of the catheter lies near the superior cavoatrial junction. The port flushes and aspirates appropriately. The port was flushed and locked with heparinized saline. The port pocket was closed in 2 layers using 3-0 and 4-0 Vicryl/absorbable suture. Dermabond was also applied to both incisions. The patient tolerated the procedure well and was transferred to recovery in stable condition. IMPRESSION: Successful placement of a right-sided chest port via the right internal jugular vein. The port is ready for immediate use. Electronically Signed   By: Olive Bass M.D.   On: 05/12/2022 15:53   NM PET Image Initial (PI) Skull Base To Thigh  Result Date: 05/11/2022 CLINICAL DATA:  Initial treatment strategy for rectal carcinoma. EXAM: NUCLEAR MEDICINE PET SKULL BASE TO THIGH TECHNIQUE: 9.1 mCi F-18 FDG was injected intravenously. Full-ring PET imaging was performed from the skull base to thigh after the radiotracer. CT data was obtained and used for attenuation correction and anatomic localization.  Fasting blood glucose: 149 mg/dl COMPARISON:  CT on 62/95/2841 FINDINGS: Mediastinal blood-pool activity (background): SUV max = 2.0 Liver activity (reference): SUV max = N/A NECK:  No hypermetabolic lymph nodes or masses. Incidental CT findings:  None. CHEST: No hypermetabolic lymph nodes. No suspicious pulmonary nodules seen on CT images. Incidental CT findings: Aortic and coronary atherosclerotic calcification incidentally noted. Mild to moderate centrilobular emphysema also seen. ABDOMEN/PELVIS: No abnormal hypermetabolic activity within the liver, pancreas, adrenal glands, or spleen. Hypermetabolic mass is seen in the rectum, with SUV max of 9.4. A 5 mm left posterior perirectal lymph node is seen on image 244/3. This shows no FDG uptake but is too small to accurately characterize by PET. No hypermetabolic lymph nodes in the abdomen or pelvis. Incidental CT findings: Colonic diverticulosis noted, without evidence of diverticulitis. Aortic atherosclerotic calcification incidentally noted. Tiny umbilical hernia is seen, which contains only fat. SKELETON: No focal hypermetabolic bone lesions to suggest skeletal metastasis. Incidental CT findings:  None. IMPRESSION: Hypermetabolic rectal mass, consistent with primary rectal carcinoma. 5 mm left posterior perirectal lymph node shows no FDG uptake, but is too small to accurately characterize by PET. No other evidence of metastatic disease. Aortic Atherosclerosis (ICD10-I70.0) and Emphysema (ICD10-J43.9). Electronically Signed   By: Danae Orleans M.D.   On: 05/11/2022 15:44   CT HEMATURIA WORKUP  Result Date: 05/09/2022 CLINICAL DATA:  Hematuria. Bladder mass on recent MRI. Rectal carcinoma. * Tracking Code: BO * EXAM: CT ABDOMEN AND PELVIS WITHOUT AND WITH CONTRAST TECHNIQUE: Multidetector CT imaging of the abdomen and pelvis was performed following the standard protocol before and following the bolus administration of intravenous contrast. RADIATION DOSE  REDUCTION: This exam was performed according to the departmental dose-optimization program which includes automated exposure control, adjustment of the mA and/or kV according to patient size and/or use of iterative reconstruction technique. CONTRAST:  OMNIPAQUE IOHEXOL 300 MG/ML  SOLN COMPARISON:  05/01/2022 FINDINGS: Lower Chest: No acute findings. Hepatobiliary: No hepatic masses identified. Gallbladder is unremarkable. No evidence of biliary ductal dilatation. Pancreas:  No mass or inflammatory changes. Spleen: Within normal limits in size and  appearance. Adrenals/Urinary Tract: No adrenal masses identified. Few tiny 1-2 mm calculi are noted in both kidneys. No evidence of ureteral calculi or hydronephrosis. No suspicious renal masses identified. No masses seen involving the collecting systems or ureters. Mild diffuse bladder wall thickening and trabeculation is seen which may be due to chronic bladder outlet obstruction or cystitis. A focal enhancing soft tissue nodule measuring approximately 1.2 cm is seen along the left lateral bladder wall, suspicious for bladder carcinoma. Stomach/Bowel: Concentric rectal wall thickening and enhancement is seen, consistent with known primary rectal carcinoma. No evidence of obstruction, inflammatory process or abnormal fluid collections. Normal appendix visualized. Diverticulosis is seen mainly involving the descending and sigmoid colon, however there is no evidence of diverticulitis. Vascular/Lymphatic: No pathologically enlarged lymph nodes. No acute vascular findings. Aortic atherosclerotic calcification incidentally noted. 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 Reproductive:  No mass or other significant abnormality. Other:  None. Musculoskeletal:  No suspicious bone lesions identified. IMPRESSION: 1.2 cm focal enhancing soft tissue nodule along the left lateral bladder wall, suspicious for bladder carcinoma. Low  rectal soft tissue mass, consistent with known primary rectal carcinoma. No evidence of metastatic disease within the abdomen or pelvis. Mild diffuse bladder wall thickening and trabeculation, which may be due to chronic bladder outlet obstruction or cystitis. Tiny nonobstructing bilateral renal calculi. Colonic diverticulosis, without radiographic evidence of diverticulitis. 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 Aortic Atherosclerosis (ICD10-I70.0). Electronically Signed   By: Danae Orleans M.D.   On: 05/09/2022 10:58   MR PELVIS WO CM RECTAL CA STAGING  Result Date: 05/05/2022 CLINICAL DATA:  Rectal cancer staging EXAM: MRI PELVIS WITHOUT CONTRAST TECHNIQUE: Multiplanar multisequence MR imaging of the pelvis was performed. No intravenous contrast was administered. Ultrasound gel was administered per rectum to optimize tumor evaluation. COMPARISON:  CT chest abdomen pelvis dated 05/01/2022 FINDINGS: TUMOR LOCATION Tumor distance from Anal Verge/Skin surface: 3.3 cm (series 2/image 18) Tumor distance to Internal Anal sphincter: 0 cm (series 11/image 29) TUMOR DESCRIPTION Circumferential extent: Left lateral tumor, extending from 11:30 to 8:00 (series 10/image 35) Tumor Size and volume: Tumor measures 6.4 cm in length (series 2/image 18) and up to 2.2 cm in thickness (series 10/image 31) T - CATEGORY Extension through Muscularis Propria: Yes 6-59mm=T3c, with at least 6 mm extension at 2 o'clock (series 10/image 31) Shortest Distance of any tumor/node from Mesorectal fascia: 0 mm, with tumor directly abutting the posterior aspect of the prostate (series 10/image 34), but without convincing invasion (series 4/image 21) Extramural Vascular Invasion/Tumor Thrombus: No Invasion of Anterior Peritoneal Reflection: No Involvement of Adjacent Organs or Pelvic Sidewall: No Levator Ani Involvement: No N - CATEGORY Mesorectal Lymph Nodes >=29mm: None=N0 Extra-mesorectal Lymphadenopathy: No Other: 12 mm  polypoid lesion along the left posterolateral bladder (series 10/image 25), suspicious for incidental primary bladder neoplasm. IMPRESSION: 6.4 cm left lateral low rectal tumor abutting the internal anal sphincter, as above. Rectal adenocarcinoma T stage: T3C Rectal adenocarcinoma N stage:  N0 Distance from tumor to the internal anal sphincter is 0 cm. Electronically Signed   By: Charline Bills M.D.   On: 05/05/2022 01:01   CT CHEST ABDOMEN PELVIS W CONTRAST  Result Date: 05/02/2022 CLINICAL DATA:  Rectal mass. * Tracking Code: BO * EXAM: CT CHEST, ABDOMEN, AND PELVIS WITH CONTRAST TECHNIQUE: Multidetector CT imaging of the chest, abdomen and pelvis was performed following the standard protocol during bolus administration of intravenous contrast. RADIATION DOSE REDUCTION:  This exam was performed according to the departmental dose-optimization program which includes automated exposure control, adjustment of the mA and/or kV according to patient size and/or use of iterative reconstruction technique. CONTRAST:  OMNIPAQUE IOHEXOL 300 MG/ML  SOLN COMPARISON:  None Available. FINDINGS: CT CHEST FINDINGS Cardiovascular: The thoracic aorta has a normal course and caliber with scattered mild atherosclerotic plaque heart is nonenlarged. No pericardial effusion. Mediastinum/Nodes: No specific abnormal lymph node enlargement identified in the axillary regions, hilum or mediastinum normal caliber thoracic esophagus. Small hiatal hernia. Slightly enlarged right thyroid lobe extending posteriorly. Lungs/Pleura: Mild debris along the right main bronchus. Centrilobular emphysematous lung changes are identified particularly in the upper lung zones. Azygous fissure. There is some linear opacity of the lung bases likely scar or atelectasis. There is a 4 mm left upper lobe lung nodule on series 2 image 23. 3 mm subpleural nodule laterally along the right along the on series 2, image 42. no consolidation, pneumothorax or  effusion. Musculoskeletal: Scattered degenerative changes along the spine. CT ABDOMEN PELVIS FINDINGS Hepatobiliary: Slight fatty liver infiltration. No space-occupying liver lesion. Gallbladder is present. Patent portal vein. Pancreas: Unremarkable. No pancreatic ductal dilatation or surrounding inflammatory changes. Spleen: Normal in size without focal abnormality. Adrenals/Urinary Tract: Slight nonspecific thickening of the adrenal glands. Punctate nonobstructing stone towards the lower pole of each kidney. Enhancing renal mass or collecting system dilatation. There is a nodular area along the course of the left ureter just proximal to the bladder on series 2, image 110 measuring 14 x 10 mm. The bladder wall is thickened and trabeculated. There is a left-sided wall calcification. There is also a nodular area along left side of the urinary bladder on series 2, image 112 measuring 9 mm. The intrinsic bladder mass is possible. Please see coronal series 4, image 100. This could be neoplastic. At the level of the mass there is more asymmetric bladder wall thickening. Stomach/Bowel: There is nodular wall thickening along the low rectum with nodularity along the outer surface of the wall. Please correlate with the exact location of mass. This extends down to the edge of the anorectal junction. Adjacent vascular engorgement identified. Proximally in the sigmoid colon is presence of numerous diverticula. No bowel obstruction few. A few right-sided colonic diverticular seen. Normal appendix. Stomach is nondilated. Small bowel is nondilated. Vascular/Lymphatic: Normal caliber aorta and IVC with scattered vascular calcifications. There is ectasia of the left common iliac artery with diameter approaching 19 mm. This has has a focal area aneurysmal dilatation with some plaque and thrombus as seen best on coronal image 101 series 4. No specific abnormal lymph node enlargement identified in the abdomen. Reproductive: Enlarged  prostate with mass effect along the base of the bladder. Other: No ascites or free air. Small fat containing umbilical hernia. Musculoskeletal: Curvature of the spine with scattered degenerative changes of the spine and pelvis. Trace anterolisthesis of L4 on L5. IMPRESSION: Nodular wall thickening along the rectum consistent with the history of known mass. Adjacent vascular engorgement. No intrinsic abnormal lymph node enlargement. No liver lesion. Fatty liver infiltration. Nodule along the left side of the urinary bladder. In addition there is small mass along the course of the distal left ureter. This is worrisome for overall multifocal TCC. Recommend further workup such as CT urogram with delayed imaging postcontrast to evaluate the urothelial tract. Please correlate for any known history. Small lung nodules measuring up to 4 mm. Attention on follow-up for the patient's neoplasm versus Non-contrast chest CT can  be considered in 12 months if patient is high-risk. This recommendation follows the consensus statement: Guidelines for Management of Incidental Pulmonary Nodules Detected on CT Images: From the Fleischner Society 2017; Radiology 2017; 284:228-243. Fatty liver infiltration. Small hiatal hernia. Colonic diverticula. Emphysematous changes of the lungs. 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 Findings will be discussed with the ordering service by the Radiology physician assistant team Electronically Signed   By: Karen Kays M.D.   On: 05/02/2022 10:43

## 2022-05-23 ENCOUNTER — Inpatient Hospital Stay (HOSPITAL_BASED_OUTPATIENT_CLINIC_OR_DEPARTMENT_OTHER): Payer: PPO | Admitting: Hematology

## 2022-05-23 ENCOUNTER — Other Ambulatory Visit (HOSPITAL_COMMUNITY): Payer: Self-pay

## 2022-05-23 VITALS — BP 115/72 | HR 66 | Temp 98.4°F | Resp 18 | Ht 71.0 in | Wt 176.9 lb

## 2022-05-23 DIAGNOSIS — C20 Malignant neoplasm of rectum: Secondary | ICD-10-CM | POA: Diagnosis not present

## 2022-05-23 MED ORDER — CAPECITABINE 500 MG PO TABS
1500.0000 mg | ORAL_TABLET | Freq: Two times a day (BID) | ORAL | 0 refills | Status: DC
Start: 2022-05-23 — End: 2022-05-26
  Filled 2022-05-23: qty 180, 30d supply, fill #0

## 2022-05-23 NOTE — Patient Instructions (Signed)
[facility] at [facility] Discharge Instructions   You were seen and examined today by Dr. Ellin Saba.  He reviewed the results of your PET scan which did not show the cancer has spread to any other parts of the body.   He discussed with you referring you to a urologist to visualize inside of your bladder and biopsy the area that was seen in the bladder if needed.  He discussed with you treatment with chemoradiation followed by chemotherapy upon completion of chemoradiation. During the chemoradiation portion of your treatment, you will take chemotherapy pills called Xeloda. You will take these pills twice a day on the days of radiation treatments only (5 days a week). After the completion of radiation therapy and the Xeloda pills, you will come to the clinic every 2 weeks for a total of 8 times on the chemotherapy regimen called FOLFOX. This is the treatment regimen that you received an education class on.   Return as scheduled.    Thank you for choosing  Cancer Center at [facility] to provide your oncology and hematology care.  To afford each patient quality time with our provider, please arrive at least 15 minutes before your scheduled appointment time.   If you have a lab appointment with the Cancer Center please come in thru the Main Entrance and check in at the main information desk.  You need to re-schedule your appointment should you arrive 10 or more minutes late.  We strive to give you quality time with our providers, and arriving late affects you and other patients whose appointments are after yours.  Also, if you no show three or more times for appointments you may be dismissed from the clinic at the providers discretion.     Again, thank you for choosing [facility].  Our hope is that these requests will decrease the amount of time that you wait before being seen by our physicians.        _____________________________________________________________  Should you have questions after your visit to [facility], please contact our office at [phone] and follow the prompts.  Our office hours are 8:00 a.m. and 4:30 p.m. Monday - Friday.  Please note that voicemails left after 4:00 p.m. may not be returned until the following business day.  We are closed weekends and major holidays.  You do have access to a nurse 24-7, just call the main number to the clinic [phone] and do not press any options, hold on the line and a nurse will answer the phone.    For prescription refill requests, have your pharmacy contact our office and allow 72 hours.    Due to Covid, you will need to wear a mask upon entering the hospital. If you do not have a mask, a mask will be given to you at the Main Entrance upon arrival. For doctor visits, patients may have 1 support person age 62 or older with them. For treatment visits, patients can not have anyone with them due to social distancing guidelines and our immunocompromised population.

## 2022-05-24 ENCOUNTER — Other Ambulatory Visit (HOSPITAL_COMMUNITY): Payer: Self-pay

## 2022-05-24 ENCOUNTER — Telehealth: Payer: Self-pay

## 2022-05-24 ENCOUNTER — Telehealth: Payer: Self-pay | Admitting: Pharmacist

## 2022-05-24 NOTE — Telephone Encounter (Signed)
Oral Oncology Pharmacist Encounter  Received new prescription for Xeloda (capecitabine) for the neoadjuvant treatment of stage II rectal cancer in conjunction with radiation, planned duration until the end of radiation.  CMP from 04/26/22 assessed, no relevant lab abnormalities. Prescription dose and frequency assessed.   Current medication list in Epic reviewed, one DDIs with capecitabine identified: Allopurinol (gout): Category X interaction, Allopurinol may decrease serum concentrations of the active metabolite(s) of Fluorouracil products. The recommendation is to avoid the combination.  Would recommend that patient stop allopurinol and be switched to another medication for gout management  Evaluated chart and no patient barriers to medication adherence identified.   Prescription has been e-scribed to the [facility] for benefits analysis and approval.  Oral Oncology Clinic will continue to follow for insurance authorization, copayment issues, initial counseling and start date.   Remi Haggard, PharmD, BCPS, BCOP, CPP Hematology/Oncology Clinical Pharmacist Practitioner [place]/[facility] Oral Chemotherapy Navigation Clinic [phone]  05/24/2022 10:57 AM

## 2022-05-24 NOTE — Telephone Encounter (Signed)
Oral Oncology Patient Advocate Encounter  After completing a benefits investigation, prior authorization for Capecitabine is not required at this time through [employer].  Patient's copay is $36.38.     Ardeen Fillers, CPhT Oncology Pharmacy Patient Advocate  [facility]  [phone] (phone) [phone] (fax) 05/24/2022 8:13 AM

## 2022-05-26 NOTE — Telephone Encounter (Signed)
Per Kennith Gain, RN, "hold his Xeloda for now". Will close out patient for now await further instruction from the office in reference to the Xeloda.

## 2022-05-26 NOTE — Telephone Encounter (Signed)
Informed by MD Office: per Lequita Halt: "Hold Xeloda for now." We will OnBoard patient and schedule delivery once we receive word that patient will be starting medication.    Ardeen Fillers, CPhT Oncology Pharmacy Patient Advocate  [facility]  [phone] (phone) [phone] (fax) 05/26/2022 9:12 AM

## 2022-06-02 ENCOUNTER — Telehealth: Payer: Self-pay

## 2022-06-02 ENCOUNTER — Other Ambulatory Visit: Payer: Self-pay | Admitting: Hematology

## 2022-06-02 ENCOUNTER — Encounter: Payer: Self-pay | Admitting: Hematology

## 2022-06-02 ENCOUNTER — Ambulatory Visit (INDEPENDENT_AMBULATORY_CARE_PROVIDER_SITE_OTHER): Payer: PPO | Admitting: Urology

## 2022-06-02 VITALS — BP 109/73 | HR 76 | Ht 71.0 in

## 2022-06-02 DIAGNOSIS — N3289 Other specified disorders of bladder: Secondary | ICD-10-CM | POA: Diagnosis not present

## 2022-06-02 DIAGNOSIS — R310 Gross hematuria: Secondary | ICD-10-CM

## 2022-06-02 DIAGNOSIS — C20 Malignant neoplasm of rectum: Secondary | ICD-10-CM

## 2022-06-02 LAB — URINALYSIS, ROUTINE W REFLEX MICROSCOPIC
Bilirubin, UA: NEGATIVE
Ketones, UA: NEGATIVE
Leukocytes,UA: NEGATIVE
Nitrite, UA: NEGATIVE
Protein,UA: NEGATIVE
RBC, UA: NEGATIVE
Specific Gravity, UA: 1.02 (ref 1.005–1.030)
Urobilinogen, Ur: 0.2 mg/dL (ref 0.2–1.0)
pH, UA: 5 (ref 5.0–7.5)

## 2022-06-02 MED ORDER — CIPROFLOXACIN HCL 500 MG PO TABS
500.0000 mg | ORAL_TABLET | Freq: Once | ORAL | Status: AC
Start: 2022-06-02 — End: 2022-06-02
  Administered 2022-06-02: 500 mg via ORAL

## 2022-06-02 NOTE — Telephone Encounter (Signed)
Patient advised he starts chemo next Wednesday. He wanted to make you aware every other Wednesday he is not available every other Wednesday.

## 2022-06-02 NOTE — Progress Notes (Unsigned)
06/02/2022 9:17 AM   James Miles 1945/12/08 [id number]  Referring provider: Doreatha Massed, MD [address] [place],  [place] [address]  No chief complaint on file.   HPI: He has a 55pk year smoking hx. No recent gross hematuria.    PMH: Past Medical History:  Diagnosis Date   Back pain    COPD (chronic obstructive pulmonary disease) (HCC)    Diabetes mellitus without complication (HCC)    Hyperlipidemia     Surgical History: Past Surgical History:  Procedure Laterality Date   BIOPSY  04/26/2022   Procedure: BIOPSY;  Surgeon: Corbin Ade, MD;  [facility];  Service: Endoscopy;;   COLONOSCOPY WITH PROPOFOL N/A 04/26/2022   Procedure: COLONOSCOPY WITH PROPOFOL;  Surgeon: Corbin Ade, MD;  Location: [facility];  Service: Endoscopy;  Laterality: N/A;  10:00am; ASA 3   hemorhoidectomy     IR IMAGING GUIDED PORT INSERTION  05/12/2022   POLYPECTOMY  04/26/2022   Procedure: POLYPECTOMY;  Surgeon: Corbin Ade, MD;  Location: [facility];  Service: Endoscopy;;    Home Medications:  Allergies as of 06/02/2022   No Known Allergies      Medication List        Accurate as of June 02, 2022  9:17 AM. If you have any questions, ask your nurse or doctor.          acidophilus Caps capsule Take 1 capsule by mouth daily.   albuterol 108 (90 Base) MCG/ACT inhaler Commonly known as: VENTOLIN HFA Inhale 2 puffs into the lungs every 6 (six) hours as needed for wheezing or shortness of breath.   allopurinol 300 MG tablet Commonly known as: ZYLOPRIM Take 300 mg by mouth daily.   atorvastatin 20 MG tablet Commonly known as: LIPITOR Take 20 mg by mouth daily.   dextrose 5 % SOLN 1,000 mL with fluorouracil 5 GM/100ML SOLN Inject into the vein.   Jardiance 10 MG Tabs tablet Generic drug: empagliflozin Take 20 mg by mouth daily.   LEUCOVORIN CALCIUM IV Inject into the vein.   lidocaine-prilocaine cream Commonly known as: EMLA Apply 1  Application topically as needed.   lisinopril 20 MG tablet Commonly known as: ZESTRIL Take 20 mg by mouth daily.   loperamide 2 MG capsule Commonly known as: IMODIUM Take 2 mg by mouth 3 (three) times daily as needed for diarrhea or loose stools.   MELATONIN PO Take 1 tablet by mouth at bedtime as needed (sleep).   OXALIPLATIN IV Inject into the vein.   prochlorperazine 10 MG tablet Commonly known as: COMPAZINE Take 1 tablet (10 mg total) by mouth every 6 (six) hours as needed for nausea or vomiting.        Allergies: No Known Allergies  Family History: No family history on file.  Social History:  reports that he quit smoking about 5 years ago. His smoking use included cigarettes. He has never used smokeless tobacco. He reports that he does not use drugs. No history on file for alcohol use.  ROS: All other review of systems were reviewed and are negative except what is noted above in HPI  Physical Exam: BP 109/73   Pulse 76   Ht 5\' 11"  (1.803 m)   BMI 24.67 kg/m   Constitutional:  Alert and oriented, No acute distress. HEENT: [place] AT, moist mucus membranes.  Trachea midline, no masses. Cardiovascular: No clubbing, cyanosis, or edema. Respiratory: Normal respiratory effort, no increased work of breathing. GI:  Abdomen is soft, nontender, nondistended, no abdominal masses GU: No CVA tenderness.  Lymph: No cervical or inguinal lymphadenopathy. Skin: No rashes, bruises or suspicious lesions. Neurologic: Grossly intact, no focal deficits, moving all 4 extremities. Psychiatric: Normal mood and affect.  Laboratory Data: Lab Results  Component Value Date   WBC 8.9 04/26/2022   HGB 15.8 04/26/2022   HCT 48.4 04/26/2022   MCV 91.8 04/26/2022   PLT 201 04/26/2022    Lab Results  Component Value Date   CREATININE 0.81 04/26/2022    Lab Results  Component Value Date   PSA 3.1 10/03/2018   PSA 1.7 10/03/2017    No results found for: "TESTOSTERONE"  Lab Results   Component Value Date   HGBA1C 6.9 (H) 04/10/2019    Urinalysis No results found for: "COLORURINE", "APPEARANCEUR", "LABSPEC", "PHURINE", "GLUCOSEU", "HGBUR", "BILIRUBINUR", "KETONESUR", "PROTEINUR", "UROBILINOGEN", "NITRITE", "LEUKOCYTESUR"  No results found for: "LABMICR", "WBCUA", "RBCUA", "LABEPIT", "MUCUS", "BACTERIA"  Pertinent Imaging: *** No results found for this or any previous visit.  Results for orders placed during the hospital encounter of 10/07/18  US Venous Img Lower Bilateral  Narrative CLINICAL DATA:  Bilateral lower extremity swelling.  EXAM: BILATERAL LOWER EXTREMITY VENOUS DOPPLER ULTRASOUND  TECHNIQUE: Gray-scale sonography with graded compression, as well as color Doppler and duplex ultrasound were performed to evaluate the lower extremity deep venous systems from the level of the common femoral vein and including the common femoral, femoral, profunda femoral, popliteal and calf veins including the posterior tibial, peroneal and gastrocnemius veins when visible. The superficial great saphenous vein was also interrogated. Spectral Doppler was utilized to evaluate flow at rest and with distal augmentation maneuvers in the common femoral, femoral and popliteal veins.  COMPARISON:  None.  FINDINGS: RIGHT LOWER EXTREMITY  Common Femoral Vein: No evidence of thrombus. Normal compressibility, respiratory phasicity and response to augmentation.  Saphenofemoral Junction: No evidence of thrombus. Normal compressibility and flow on color Doppler imaging.  Profunda Femoral Vein: No evidence of thrombus. Normal compressibility and flow on color Doppler imaging.  Femoral Vein: No evidence of thrombus. Normal compressibility, respiratory phasicity and response to augmentation.  Popliteal Vein: No evidence of thrombus. Normal compressibility, respiratory phasicity and response to augmentation.  Calf Veins: No evidence of thrombus. Normal compressibility  and flow on color Doppler imaging.  Superficial Great Saphenous Vein: No evidence of thrombus. Normal compressibility.  Other Findings:  None.  LEFT LOWER EXTREMITY  Common Femoral Vein: No evidence of thrombus. Normal compressibility, respiratory phasicity and response to augmentation.  Saphenofemoral Junction: No evidence of thrombus. Normal compressibility and flow on color Doppler imaging.  Profunda Femoral Vein: No evidence of thrombus. Normal compressibility and flow on color Doppler imaging.  Femoral Vein: No evidence of thrombus. Normal compressibility, respiratory phasicity and response to augmentation.  Popliteal Vein: No evidence of thrombus. Normal compressibility, respiratory phasicity and response to augmentation.  Calf Veins: No evidence of thrombus. Normal compressibility and flow on color Doppler imaging.  Other Findings: None.  IMPRESSION: No evidence of deep venous thrombosis in either lower extremity.   Electronically Signed By: Maisie Fus  Register On: 10/07/2018 14:00  No results found for this or any previous visit.  No results found for this or any previous visit.  No results found for this or any previous visit.  No valid procedures specified. Results for orders placed during the hospital encounter of 05/09/22  CT HEMATURIA WORKUP  Narrative CLINICAL DATA:  Hematuria. Bladder mass on recent MRI. Rectal carcinoma. * Tracking Code: BO *  EXAM: CT ABDOMEN AND PELVIS WITHOUT AND WITH CONTRAST  TECHNIQUE: Multidetector CT imaging of the abdomen and pelvis was performed following the standard protocol before and following the bolus administration of intravenous contrast.  RADIATION DOSE REDUCTION: This exam was performed according to the departmental dose-optimization program which includes automated exposure control, adjustment of the mA and/or kV according to patient size and/or use of iterative reconstruction technique.  CONTRAST:   OMNIPAQUE IOHEXOL 300 MG/ML  SOLN  COMPARISON:  05/01/2022  FINDINGS: Lower Chest: No acute findings.  Hepatobiliary: No hepatic masses identified. Gallbladder is unremarkable. No evidence of biliary ductal dilatation.  Pancreas:  No mass or inflammatory changes.  Spleen: Within normal limits in size and appearance.  Adrenals/Urinary Tract: No adrenal masses identified. Few tiny 1-2 mm calculi are noted in both kidneys. No evidence of ureteral calculi or hydronephrosis. No suspicious renal masses identified. No masses seen involving the collecting systems or ureters. Mild diffuse bladder wall thickening and trabeculation is seen which may be due to chronic bladder outlet obstruction or cystitis. A focal enhancing soft tissue nodule measuring approximately 1.2 cm is seen along the left lateral bladder wall, suspicious for bladder carcinoma.  Stomach/Bowel: Concentric rectal wall thickening and enhancement is seen, consistent with known primary rectal carcinoma. No evidence of obstruction, inflammatory process or abnormal fluid collections. Normal appendix visualized. Diverticulosis is seen mainly involving the descending and sigmoid colon, however there is no evidence of diverticulitis.  Vascular/Lymphatic: No pathologically enlarged lymph nodes. No acute vascular findings. Aortic atherosclerotic calcification incidentally noted. 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  Reproductive:  No mass or other significant abnormality.  Other:  None.  Musculoskeletal:  No suspicious bone lesions identified.  IMPRESSION: 1.2 cm focal enhancing soft tissue nodule along the left lateral bladder wall, suspicious for bladder carcinoma.  Low rectal soft tissue mass, consistent with known primary rectal carcinoma.  No evidence of metastatic disease within the abdomen or pelvis.  Mild diffuse bladder wall thickening and  trabeculation, which may be due to chronic bladder outlet obstruction or cystitis.  Tiny nonobstructing bilateral renal calculi.  Colonic diverticulosis, without radiographic evidence of diverticulitis.  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  Aortic Atherosclerosis (ICD10-I70.0).   Electronically Signed By: Danae Orleans M.D. On: 05/09/2022 10:58  No results found for this or any previous visit.   Assessment & Plan:    1. Bladder mass *** - Urinalysis, Routine w reflex microscopic  2. Gross hematuria ***   No follow-ups on file.  Wilkie Aye, MD  [facility] [place]

## 2022-06-02 NOTE — Progress Notes (Signed)
START ON PATHWAY REGIMEN - Colorectal     A cycle is every 14 days:     Oxaliplatin      Leucovorin      Fluorouracil      Fluorouracil   **Always confirm dose/schedule in your pharmacy ordering system**  Patient Characteristics: Preoperative or Nonsurgical Candidate, M0 (Clinical Staging), Rectal, cT2, cN1 or cT3, cN0-1, and Not a Candidate for Sphincter-sparing Surgery or Neoadjuvant ChemoRT Preferred Tumor Location: Rectal Therapeutic Status: Preoperative or Nonsurgical Candidate, M0 (Clinical Staging) AJCC T Category: cT3 AJCC N Category: cN0 AJCC M Category: cM0 AJCC 8 Stage Grouping: IIA Intent of Therapy: Curative Intent, Discussed with Patient 

## 2022-06-03 ENCOUNTER — Other Ambulatory Visit: Payer: Self-pay

## 2022-06-06 ENCOUNTER — Encounter: Payer: Self-pay | Admitting: Urology

## 2022-06-06 NOTE — Progress Notes (Signed)
Pharmacist Chemotherapy Monitoring - Initial Assessment    Anticipated start date: 06/07/2022   The following has been reviewed per standard work regarding the patient's treatment regimen: The patient's diagnosis, treatment plan and drug doses, and organ/hematologic function Lab orders and baseline tests specific to treatment regimen  The treatment plan start date, drug sequencing, and pre-medications Prior authorization status  Patient's documented medication list, including drug-drug interaction screen and prescriptions for anti-emetics and supportive care specific to the treatment regimen The drug concentrations, fluid compatibility, administration routes, and timing of the medications to be used The patient's access for treatment and lifetime cumulative dose history, if applicable  The patient's medication allergies and previous infusion related reactions, if applicable   Changes made to treatment plan:  N/A  Follow up needed:  N/A   Stephens Shire, [facility], 06/06/2022  1:34 PM

## 2022-06-06 NOTE — Patient Instructions (Signed)
Transurethral Resection of Bladder Tumor  Transurethral resection of a bladder tumor is the removal (resection) of cancerous tissue (tumor) from the inside wall of the bladder. The bladder is the organ that holds urine. The tumor is removed through the tube that carries urine out of the body (urethra). In a transurethral resection, a thin telescope with a light, a tiny camera, and an electric cutting edge (resectoscope) is passed through the urethra. In men, the opening of the urethra is at the end of the penis. In women, it is just above the opening of the vagina. Tell a health care provider about: Any allergies you have. All medicines you are taking, including vitamins, herbs, eye drops, creams, and over-the-counter medicines. Any problems you or family members have had with anesthetic medicines. Any bleeding problems you have. Any surgeries you have had. Any medical conditions you have, including recent urinary tract infections. Whether you are pregnant or may be pregnant. What are the risks? Generally, this is a safe procedure. However, problems may occur, including: Infection. Bleeding. Allergic reactions to medicines. Damage to nearby structures or organs. Difficulty urinating from blockage of the urethra or not being able to urinate (urinary retention). Deep vein thrombosis. This is a blood clot that can develop in your leg. Recurring cancer. What happens before the procedure? When to stop eating and drinking Follow instructions from your health care provider about what you may eat and drink before your procedure. These may include: 8 hours before your procedure Stop eating most foods. Do not eat meat, fried foods, or fatty foods. Eat only light foods, such as toast or crackers. All liquids are okay except energy drinks and alcohol. 6 hours before your procedure Stop eating. Drink only clear liquids, such as water, clear fruit juice, black coffee, plain tea, and sports  drinks. Do not drink energy drinks or alcohol. 2 hours before your procedure Stop drinking all liquids. You may be allowed to take medicines with small sips of water. Medicines Ask your health care provider about: Changing or stopping your regular medicines. This is especially important if you are taking diabetes medicines or blood thinners. Taking medicines such as aspirin and ibuprofen. These medicines can thin your blood. Do not take these medicines unless your health care provider tells you to take them. Taking over-the-counter medicines, vitamins, herbs, and supplements. General instructions If you will be going home right after the procedure, plan to have a responsible adult: Take you home from the hospital or clinic. You will not be allowed to drive. Care for you for the time you are told. Ask your health care provider what steps will be taken to help prevent infection. These steps may include: Washing skin with a germ-killing soap. Taking antibiotic medicine. Do not use any products that contain nicotine or tobacco for at least 4 weeks before the procedure. These products include cigarettes, chewing tobacco, and vaping devices, such as e-cigarettes. If you need help quitting, ask your health care provider. What happens during the procedure? An IV will be inserted into one of your veins. You will be given one or more of the following: A medicine to help you relax (sedative). A medicine that is injected into your spine to numb the area below and slightly above the injection site (spinal anesthetic). A medicine that is injected into an area of your body to numb everything below the injection site (regional anesthetic). A medicine to make you fall asleep (general anesthetic). Your legs will be placed in foot rests (  stirrups) to open your legs and bend your knees. The resectoscope will be passed through your urethra and into your bladder. The part of your bladder with the tumor will be  resected by the cutting edge of the resectoscope. Fluid will be passed to rinse out the cut tissues (irrigation). The resectoscope will then be taken out. A small, thin tube (catheter) will be passed through your urethra and into your bladder. The catheter will drain urine into a bag outside of your body. The procedure may vary among health care providers and hospitals. What happens after the procedure? Your blood pressure, heart rate, breathing rate, and blood oxygen level will be monitored until you leave the hospital or clinic. You may continue to receive fluids and medicines through an IV. You will be given pain medicine to relieve pain. You will have a catheter to drain your urine. The amount of urine will be measured. If you have blood in your urine, your bladder may be rinsed out by passing fluid through your catheter. You will be encouraged to walk as soon as you can. You may have to wear compression stockings. These stockings help to prevent blood clots and reduce swelling in your legs. If you were given a sedative during the procedure, it can affect you for several hours. Do not drive or operate machinery until your health care provider says that it is safe. Summary Transurethral resection of a bladder tumor is the removal (resection) of a cancerous growth (tumor) on the inside wall of the bladder. To do this procedure, your health care provider uses a thin telescope with a light, a tiny camera, and an electric cutting edge (resectoscope) that is guided to your bladder through your urethra. The part of your bladder that is affected by the tumor will be resected by the cutting edge of the resectoscope. A catheter will be passed through your urethra and into your bladder. The catheter will drain urine into a bag outside of your body. If you will be going home right after the procedure, plan to have a responsible adult take you home from the hospital or clinic. You will not be allowed to  drive. This information is not intended to replace advice given to you by your health care provider. Make sure you discuss any questions you have with your health care provider. Document Revised: 01/28/2021 Document Reviewed: 01/28/2021 [employer] Patient Education  2023 [employer]  

## 2022-06-06 NOTE — Progress Notes (Signed)
[facility] [address], [place] [address]    Clinic Day:  06/07/2022  Referring physician: Ponciano Ort [name]  Patient Care Team: [place], [name] as PCP - General Therese Sarah, RN as Oncology Nurse Navigator (Medical Oncology) Doreatha Massed, MD as Medical Oncologist (Medical Oncology)   ASSESSMENT & PLAN:   Assessment: 1.  Stage II (T3 N0 M0) low rectal adenocarcinoma, MMR preserved: - 44-month history of intermittent rectal bleeding.  No weight loss. - Colonoscopy (04/26/2022): Large cauliflower mass palpated in the distal rectum on DRE.  Scattered diverticula in the descending colon.  5 mm polyp in the ascending colon.  In the rectum, exophytic semilunar neoplastic appearing process beginning at the anal verge and extending proximally about 6 cm. - Pathology (04/26/2022): Invasive moderately differentiated adenocarcinoma of the rectal mass.  MMR preserved. - CT CAP (05/01/2022): Nodular wall thickening along the rectum.  No intrinsic abnormal lymph nodes.  No liver lesion.  Fatty liver.  Nodule along the left side of the urinary bladder.  Small mass along the course of the distal left ureter.  Worrisome for multifocal TCC.  Small lung nodules measuring up to 4 mm. - MRI pelvis (05/03/2022): 6.4 cm left lateral low rectal tumor abutting the internal anal sphincter, T3c N0.  Distance from tumor to the internal anal sphincter is 0 cm.  Tumor measures 6.4 cm in length and up to 2.2 cm in thickness. - PET scan (05/11/2022): No evidence of metastatic disease. - Met with Dr. Maisie Fus on 05/16/2022: She is in agreement with TNT and has discussed APR with colostomy.   2.  Social/family history: -He lives at home and is independent of ADLs and IADLs.  He is accompanied by his son today.  Worked at [employer] prior to retirement.  Also served in [place] but denied any exposure to agent orange.  Quit smoking 1 year ago.  Smoked 1 to 2 packs/day  for the last 61 years.  Started at age 31. - No family history of malignancies.    Plan: 1.  Stage II (T3 cN0 M0) low rectal adenocarcinoma, MMR preserved: - Labs today: Normal LFTs and creatinine.  CBC grossly normal with mild leukocytosis and lymphocytosis. - We discussed his treatment plan with FOLFOX chemotherapy for 8 cycles followed by chemoradiation therapy followed by evaluation for surgery.  If he gets excellent response after chemoradiation therapy, he can be watched without surgery. - We discussed side effects of FOLFOX including cold sensitivity and neuropathy, diarrhea and mucositis. - He will proceed with cycle 1 at 20% dose reduction.  RTC 2 weeks for follow-up.  If he tolerates well, will increase dose.   2.  Left urinary bladder mass and distal left ureteral mass: - He underwent an office cystoscopy by Dr. Ronne Binning which showed 3 cm left lateral wall bladder mass. - He is being scheduled for biopsy.    No orders of the defined types were placed in this encounter.     I,Katie Daubenspeck,acting as a Neurosurgeon for Doreatha Massed, MD.,have documented all relevant documentation on the behalf of Doreatha Massed, MD,as directed by  Doreatha Massed, MD while in the presence of Doreatha Massed, MD.   I, Doreatha Massed MD, have reviewed the above documentation for accuracy and completeness, and I agree with the above.   Doreatha Massed, MD   5/1/20245:58 PM  CHIEF COMPLAINT:   Diagnosis: Rectal adenocarcinoma    Cancer Staging  Rectal adenocarcinoma [facility]) Staging  form: Colon and Rectum, AJCC 8th Edition - Clinical stage from 05/07/2022: Stage IIA (cT3, cN0, cM0) - Unsigned    Prior Therapy: none  Current Therapy:  Chemoradiation followed by surgery    HISTORY OF PRESENT ILLNESS:   Oncology History  Rectal adenocarcinoma (HCC)  05/07/2022 Initial Diagnosis   Rectal adenocarcinoma (HCC)   06/07/2022 -  Chemotherapy   Patient is on  Treatment Plan : COLORECTAL FOLFOX q14d x 4 months       He presented to his PCP with a six month h/o rectal bleeding. His last colonoscopy in 07/2005 showed hyperplastic polyps, and he did not return for repeat in 10 years as recommended. However, a Cologuard test in 10/2018 was negative. He was referred to Brooke Bonito, NP in GI on 04/18/22, DRE was benign with no overt blood and small amount of hemorrhoid tissue felt. He proceeded to colonoscopy on 04/26/22 with Dr. Jena Gauss, which revealed a bulky rectal neoplasm beginning at the anal verge and extending about 6 cm proximally. Pathology from the procedure confirmed invasive moderately differentiated adenocarcinoma, MMR normal. A CEA obtained the same day was WNL at 2.6.   He underwent staging CT C/A/P on 05/01/22 showing: nodular wall thickening along rectum with adjacent vascular engorgement; no abnormal lymph node enlargement or liver lesion. Additionally, incidental findings include a nodule along the left side of the urinary bladder and a small mass along the course of the distal left ureter, worrisome for multifocal TCC, and small lung nodules measuring up to 4 mm.   He also underwent staging pelvis MRI on 05/03/22 showing: 6.4 cm left lateral low rectal tumor abutting internal anal sphincter; stage T3c, N0.  INTERVAL HISTORY:   Larnce is a 77 y.o. male presenting to clinic today for follow up of Rectal adenocarcinoma. He was last seen by me on 05/23/22 in consultation.  Since his last visit, he met Dr. Ronne Binning on 06/02/22 for follow up of bladder tumor seen on staging CT. Per Dr. Dimas Millin note, the patient is to be scheduled for TURBT.  Today, he states that he is doing well overall. His appetite level is at 100%. His energy level is at 70%.  PAST MEDICAL HISTORY:   Past Medical History: Past Medical History:  Diagnosis Date   Back pain    COPD (chronic obstructive pulmonary disease) (HCC)    Diabetes mellitus without complication (HCC)     Hyperlipidemia     Surgical History: Past Surgical History:  Procedure Laterality Date   BIOPSY  04/26/2022   Procedure: BIOPSY;  Surgeon: Corbin Ade, MD;  [facility];  Service: Endoscopy;;   COLONOSCOPY WITH PROPOFOL N/A 04/26/2022   Procedure: COLONOSCOPY WITH PROPOFOL;  Surgeon: Corbin Ade, MD;  Location: [facility];  Service: Endoscopy;  Laterality: N/A;  10:00am; ASA 3   hemorhoidectomy     IR IMAGING GUIDED PORT INSERTION  05/12/2022   POLYPECTOMY  04/26/2022   Procedure: POLYPECTOMY;  Surgeon: Corbin Ade, MD;  Location: [facility];  Service: Endoscopy;;    Social History: Social History   Socioeconomic History   Marital status: Widowed    Spouse name: Not on file   Number of children: 2   Years of education: Not on file   Highest education level: Not on file  Occupational History   Not on file  Tobacco Use   Smoking status: Former    Years: 50    Types: Cigarettes    Quit date: 08/2016  Years since quitting: 5.8   Smokeless tobacco: Never  Vaping Use   Vaping Use: Never used  Substance and Sexual Activity   Alcohol use: Not on file   Drug use: Never   Sexual activity: Not Currently  Other Topics Concern   Not on file  Social History Narrative   Not on file   Social Determinants of Health   Financial Resource Strain: Not on file  Food Insecurity: No Food Insecurity (05/08/2022)   Hunger Vital Sign    Worried About Running Out of Food in the Last Year: Never true    Ran Out of Food in the Last Year: Never true  Transportation Needs: No Transportation Needs (05/08/2022)   PRAPARE - Administrator, Civil Service (Medical): No    Lack of Transportation (Non-Medical): No  Physical Activity: Not on file  Stress: Not on file  Social Connections: Not on file  Intimate Partner Violence: Not At Risk (05/08/2022)   Humiliation, Afraid, Rape, and Kick questionnaire    Fear of Current or Ex-Partner: No    Emotionally  Abused: No    Physically Abused: No    Sexually Abused: No    Family History: No family history on file.  Current Medications:  Current Outpatient Medications:    acidophilus (RISAQUAD) CAPS capsule, Take 1 capsule by mouth daily., Disp: , Rfl:    albuterol (VENTOLIN HFA) 108 (90 Base) MCG/ACT inhaler, Inhale 2 puffs into the lungs every 6 (six) hours as needed for wheezing or shortness of breath., Disp: , Rfl:    allopurinol (ZYLOPRIM) 300 MG tablet, Take 300 mg by mouth daily., Disp: , Rfl:    atorvastatin (LIPITOR) 20 MG tablet, Take 20 mg by mouth daily., Disp: , Rfl:    dextrose 5 % SOLN 1,000 mL with fluorouracil 5 GM/100ML SOLN, Inject into the vein., Disp: , Rfl:    JARDIANCE 10 MG TABS tablet, Take 20 mg by mouth daily., Disp: , Rfl:    LEUCOVORIN CALCIUM IV, Inject into the vein., Disp: , Rfl:    lidocaine-prilocaine (EMLA) cream, Apply 1 Application topically as needed., Disp: 30 g, Rfl: 0   lisinopril (ZESTRIL) 20 MG tablet, Take 20 mg by mouth daily., Disp: , Rfl:    loperamide (IMODIUM) 2 MG capsule, Take 2 mg by mouth 3 (three) times daily as needed for diarrhea or loose stools., Disp: , Rfl:    MELATONIN PO, Take 1 tablet by mouth at bedtime as needed (sleep)., Disp: , Rfl:    OXALIPLATIN IV, Inject into the vein., Disp: , Rfl:    prochlorperazine (COMPAZINE) 10 MG tablet, Take 1 tablet (10 mg total) by mouth every 6 (six) hours as needed for nausea or vomiting., Disp: 30 tablet, Rfl: 2   lidocaine-prilocaine (EMLA) cream, Apply to affected area once, Disp: 30 g, Rfl: 3   prochlorperazine (COMPAZINE) 10 MG tablet, Take 1 tablet (10 mg total) by mouth every 6 (six) hours as needed for nausea or vomiting., Disp: 60 tablet, Rfl: 4 No current facility-administered medications for this visit.  Facility-Administered Medications Ordered in Other Visits:    fluorouracil (ADRUCIL) 3,500 mg in sodium chloride 0.9 % 80 mL chemo infusion, 1,920 mg/m2 (Treatment Plan Recorded),  Intravenous, 1 day or 1 dose, Doreatha Massed, MD, Infusion Verify at 06/07/22 1346   heparin lock flush 100 unit/mL, 500 Units, Intracatheter, Once PRN, Doreatha Massed, MD   sodium chloride flush (NS) 0.9 % injection 10 mL, 10 mL, Intracatheter, PRN, Doreatha Massed,  MD   Allergies: No Known Allergies  REVIEW OF SYSTEMS:   Review of Systems  Constitutional:  Negative for chills, fatigue and fever.  HENT:   Negative for lump/mass, mouth sores, nosebleeds, sore throat and trouble swallowing.   Eyes:  Negative for eye problems.  Respiratory:  Positive for shortness of breath. Negative for cough.   Cardiovascular:  Negative for chest pain, leg swelling and palpitations.  Gastrointestinal:  Negative for abdominal pain, constipation, diarrhea, nausea and vomiting.  Genitourinary:  Negative for bladder incontinence, difficulty urinating, dysuria, frequency, hematuria and nocturia.   Musculoskeletal:  Negative for arthralgias, back pain, flank pain, myalgias and neck pain.  Skin:  Negative for itching and rash.  Neurological:  Negative for dizziness, headaches and numbness.  Hematological:  Does not bruise/bleed easily.  Psychiatric/Behavioral:  Positive for sleep disturbance. Negative for depression and suicidal ideas. The patient is not nervous/anxious.   All other systems reviewed and are negative.    VITALS:   There were no vitals taken for this visit.  Wt Readings from Last 3 Encounters:  06/07/22 173 lb 6.4 oz (78.7 kg)  05/23/22 176 lb 14.4 oz (80.2 kg)  05/12/22 176 lb (79.8 kg)    There is no height or weight on file to calculate BMI.  Performance status (ECOG): 1 - Symptomatic but completely ambulatory  PHYSICAL EXAM:   Physical Exam Vitals and nursing note reviewed. Exam conducted with a chaperone present.  Constitutional:      Appearance: Normal appearance.  Cardiovascular:     Rate and Rhythm: Normal rate and regular rhythm.     Pulses: Normal  pulses.     Heart sounds: Normal heart sounds.  Pulmonary:     Effort: Pulmonary effort is normal.     Breath sounds: Normal breath sounds.  Abdominal:     Palpations: Abdomen is soft. There is no hepatomegaly, splenomegaly or mass.     Tenderness: There is no abdominal tenderness.  Musculoskeletal:     Right lower leg: No edema.     Left lower leg: No edema.  Lymphadenopathy:     Cervical: No cervical adenopathy.     Right cervical: No superficial, deep or posterior cervical adenopathy.    Left cervical: No superficial, deep or posterior cervical adenopathy.     Upper Body:     Right upper body: No supraclavicular or axillary adenopathy.     Left upper body: No supraclavicular or axillary adenopathy.  Neurological:     General: No focal deficit present.     Mental Status: He is alert and oriented to person, place, and time.  Psychiatric:        Mood and Affect: Mood normal.        Behavior: Behavior normal.     LABS:      Latest Ref Rng & Units 06/07/2022    8:01 AM 04/26/2022   12:55 PM 10/03/2018    1:43 AM  CBC  WBC 4.0 - 10.5 K/uL 10.9  8.9  9.2      Hemoglobin 13.0 - 17.0 g/dL 16.1  09.6  04.5      Hematocrit 39.0 - 52.0 % 48.5  48.4  50      Platelets 150 - 400 K/uL 244  201  216         This result is from an external source.      Latest Ref Rng & Units 06/07/2022    8:01 AM 04/26/2022   12:55 PM 04/21/2022  10:01 AM  CMP  Glucose 70 - 99 mg/dL 191  478  295   BUN 8 - 23 mg/dL 18  7  13    Creatinine 0.61 - 1.24 mg/dL 6.21  3.08  6.57   Sodium 135 - 145 mmol/L 137  135  140   Potassium 3.5 - 5.1 mmol/L 3.6  3.5  3.5   Chloride 98 - 111 mmol/L 101  102  105   CO2 22 - 32 mmol/L 25  26  24    Calcium 8.9 - 10.3 mg/dL 9.0  8.4  9.1   Total Protein 6.5 - 8.1 g/dL 7.6  7.0    Total Bilirubin 0.3 - 1.2 mg/dL 1.0  0.5    Alkaline Phos 38 - 126 U/L 73  69    AST 15 - 41 U/L 14  21    ALT 0 - 44 U/L 15  26       Lab Results  Component Value Date   CEA1 2.6  04/26/2022   /  CEA  Date Value Ref Range Status  04/26/2022 2.6 0.0 - 4.7 ng/mL Final    Comment:    (NOTE)                             Nonsmokers          <3.9                             Smokers             <5.6 Roche [employer] Electrochemiluminescence Immunoassay (ECLIA) Values obtained with different assay methods or kits cannot be used interchangeably.  Results cannot be interpreted as absolute evidence of the presence or absence of malignant disease. Performed At: [facility] [address] [place], [place] [id number] Jolene Schimke MD WU:[id number]    No results found for: "PSA1" No results found for: "[id number]" No results found for: "CAN125"  No results found for: "TOTALPROTELP", "ALBUMINELP", "A1GS", "A2GS", "BETS", "BETA2SER", "GAMS", "MSPIKE", "SPEI" No results found for: "TIBC", "FERRITIN", "IRONPCTSAT" No results found for: "LDH"   STUDIES:   IR IMAGING GUIDED PORT INSERTION  Result Date: 05/12/2022 INDICATION: chemotherapy administration EXAM: Port placement using ultrasound and fluoroscopic guidance MEDICATIONS: Documented in the EMR ANESTHESIA/SEDATION: Moderate (conscious) sedation was employed during this procedure. A total of Versed 1 mg and Fentanyl 50 mcg was administered intravenously. Moderate Sedation Time: 24 minutes. The patient's level of consciousness and vital signs were monitored continuously by radiology nursing throughout the procedure under my direct supervision. FLUOROSCOPY TIME:  Fluoroscopy Time: 0.3 minutes (<1 mGy) COMPLICATIONS: None immediate. PROCEDURE: Informed written consent was obtained from the patient after a thorough discussion of the procedural risks, benefits and alternatives. All questions were addressed. Maximal Sterile Barrier Technique was utilized including caps, mask, sterile gowns, sterile gloves, sterile drape, hand hygiene and skin antiseptic. A timeout was performed prior to the initiation of the procedure. The  patient was placed supine on the exam table. The right neck and chest was prepped and draped in the standard sterile fashion. A preliminary ultrasound of the right neck was performed and demonstrates a patent right internal jugular vein. A permanent ultrasound image was stored in the electronic medical record. The overlying skin was anesthetized with 1% Lidocaine. Using ultrasound guidance, access was obtained into the right internal jugular vein using a 21 gauge micropuncture set. A wire was  advanced into the SVC, a short incision was made at the puncture site, and serial dilatation performed. Next, in an ipsilateral infraclavicular location, an incision was made at the site of the subcutaneous reservoir. Blunt dissection was used to open a pocket to contain the reservoir. A subcutaneous tunnel was then created from the port site to the puncture site. A(n) 8 Fr single lumen catheter was advanced through the tunnel. The catheter was attached to the port and this was placed in the subcutaneous pocket. Under fluoroscopic guidance, a peel away sheath was placed, and the catheter was trimmed to the appropriate length and was advanced into the central veins. The catheter length is 25 cm. The tip of the catheter lies near the superior cavoatrial junction. The port flushes and aspirates appropriately. The port was flushed and locked with heparinized saline. The port pocket was closed in 2 layers using 3-0 and 4-0 Vicryl/absorbable suture. Dermabond was also applied to both incisions. The patient tolerated the procedure well and was transferred to recovery in stable condition. IMPRESSION: Successful placement of a right-sided chest port via the right internal jugular vein. The port is ready for immediate use. Electronically Signed   By: Olive Bass M.D.   On: 05/12/2022 15:53   NM PET Image Initial (PI) Skull Base To Thigh  Result Date: 05/11/2022 CLINICAL DATA:  Initial treatment strategy for rectal carcinoma. EXAM:  NUCLEAR MEDICINE PET SKULL BASE TO THIGH TECHNIQUE: 9.1 mCi F-18 FDG was injected intravenously. Full-ring PET imaging was performed from the skull base to thigh after the radiotracer. CT data was obtained and used for attenuation correction and anatomic localization. Fasting blood glucose: 149 mg/dl COMPARISON:  CT on 16/11/9602 FINDINGS: Mediastinal blood-pool activity (background): SUV max = 2.0 Liver activity (reference): SUV max = N/A NECK:  No hypermetabolic lymph nodes or masses. Incidental CT findings:  None. CHEST: No hypermetabolic lymph nodes. No suspicious pulmonary nodules seen on CT images. Incidental CT findings: Aortic and coronary atherosclerotic calcification incidentally noted. Mild to moderate centrilobular emphysema also seen. ABDOMEN/PELVIS: No abnormal hypermetabolic activity within the liver, pancreas, adrenal glands, or spleen. Hypermetabolic mass is seen in the rectum, with SUV max of 9.4. A 5 mm left posterior perirectal lymph node is seen on image 244/3. This shows no FDG uptake but is too small to accurately characterize by PET. No hypermetabolic lymph nodes in the abdomen or pelvis. Incidental CT findings: Colonic diverticulosis noted, without evidence of diverticulitis. Aortic atherosclerotic calcification incidentally noted. Tiny umbilical hernia is seen, which contains only fat. SKELETON: No focal hypermetabolic bone lesions to suggest skeletal metastasis. Incidental CT findings:  None. IMPRESSION: Hypermetabolic rectal mass, consistent with primary rectal carcinoma. 5 mm left posterior perirectal lymph node shows no FDG uptake, but is too small to accurately characterize by PET. No other evidence of metastatic disease. Aortic Atherosclerosis (ICD10-I70.0) and Emphysema (ICD10-J43.9). Electronically Signed   By: Danae Orleans M.D.   On: 05/11/2022 15:44   CT HEMATURIA WORKUP  Result Date: 05/09/2022 CLINICAL DATA:  Hematuria. Bladder mass on recent MRI. Rectal carcinoma. * Tracking  Code: BO * EXAM: CT ABDOMEN AND PELVIS WITHOUT AND WITH CONTRAST TECHNIQUE: Multidetector CT imaging of the abdomen and pelvis was performed following the standard protocol before and following the bolus administration of intravenous contrast. RADIATION DOSE REDUCTION: This exam was performed according to the departmental dose-optimization program which includes automated exposure control, adjustment of the mA and/or kV according to patient size and/or use of iterative reconstruction technique. CONTRAST:   OMNIPAQUE IOHEXOL 300 MG/ML  SOLN COMPARISON:  05/01/2022 FINDINGS: Lower Chest: No acute findings. Hepatobiliary: No hepatic masses identified. Gallbladder is unremarkable. No evidence of biliary ductal dilatation. Pancreas:  No mass or inflammatory changes. Spleen: Within normal limits in size and appearance. Adrenals/Urinary Tract: No adrenal masses identified. Few tiny 1-2 mm calculi are noted in both kidneys. No evidence of ureteral calculi or hydronephrosis. No suspicious renal masses identified. No masses seen involving the collecting systems or ureters. Mild diffuse bladder wall thickening and trabeculation is seen which may be due to chronic bladder outlet obstruction or cystitis. A focal enhancing soft tissue nodule measuring approximately 1.2 cm is seen along the left lateral bladder wall, suspicious for bladder carcinoma. Stomach/Bowel: Concentric rectal wall thickening and enhancement is seen, consistent with known primary rectal carcinoma. No evidence of obstruction, inflammatory process or abnormal fluid collections. Normal appendix visualized. Diverticulosis is seen mainly involving the descending and sigmoid colon, however there is no evidence of diverticulitis. Vascular/Lymphatic: No pathologically enlarged lymph nodes. No acute vascular findings. Aortic atherosclerotic calcification incidentally noted. 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  Reproductive:  No mass or other significant abnormality. Other:  None. Musculoskeletal:  No suspicious bone lesions identified. IMPRESSION: 1.2 cm focal enhancing soft tissue nodule along the left lateral bladder wall, suspicious for bladder carcinoma. Low rectal soft tissue mass, consistent with known primary rectal carcinoma. No evidence of metastatic disease within the abdomen or pelvis. Mild diffuse bladder wall thickening and trabeculation, which may be due to chronic bladder outlet obstruction or cystitis. Tiny nonobstructing bilateral renal calculi. Colonic diverticulosis, without radiographic evidence of diverticulitis. 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 Aortic Atherosclerosis (ICD10-I70.0). Electronically Signed   By: Danae Orleans M.D.   On: 05/09/2022 10:58

## 2022-06-07 ENCOUNTER — Encounter: Payer: Self-pay | Admitting: Hematology

## 2022-06-07 ENCOUNTER — Inpatient Hospital Stay: Payer: PPO | Attending: Hematology

## 2022-06-07 ENCOUNTER — Inpatient Hospital Stay: Payer: PPO

## 2022-06-07 ENCOUNTER — Inpatient Hospital Stay (HOSPITAL_BASED_OUTPATIENT_CLINIC_OR_DEPARTMENT_OTHER): Payer: PPO | Admitting: Hematology

## 2022-06-07 VITALS — BP 100/56 | HR 61 | Temp 96.8°F | Resp 18

## 2022-06-07 DIAGNOSIS — C20 Malignant neoplasm of rectum: Secondary | ICD-10-CM | POA: Diagnosis not present

## 2022-06-07 DIAGNOSIS — Z452 Encounter for adjustment and management of vascular access device: Secondary | ICD-10-CM | POA: Diagnosis not present

## 2022-06-07 DIAGNOSIS — Z5111 Encounter for antineoplastic chemotherapy: Secondary | ICD-10-CM | POA: Insufficient documentation

## 2022-06-07 DIAGNOSIS — N329 Bladder disorder, unspecified: Secondary | ICD-10-CM | POA: Diagnosis not present

## 2022-06-07 DIAGNOSIS — R0602 Shortness of breath: Secondary | ICD-10-CM | POA: Diagnosis not present

## 2022-06-07 DIAGNOSIS — Z87891 Personal history of nicotine dependence: Secondary | ICD-10-CM | POA: Insufficient documentation

## 2022-06-07 DIAGNOSIS — R918 Other nonspecific abnormal finding of lung field: Secondary | ICD-10-CM | POA: Diagnosis not present

## 2022-06-07 LAB — CBC WITH DIFFERENTIAL/PLATELET
Abs Immature Granulocytes: 0.04 10*3/uL (ref 0.00–0.07)
Basophils Absolute: 0 10*3/uL (ref 0.0–0.1)
Basophils Relative: 0 %
Eosinophils Absolute: 0.2 10*3/uL (ref 0.0–0.5)
Eosinophils Relative: 2 %
HCT: 48.5 % (ref 39.0–52.0)
Hemoglobin: 16.1 g/dL (ref 13.0–17.0)
Immature Granulocytes: 0 %
Lymphocytes Relative: 37 %
Lymphs Abs: 4.1 10*3/uL — ABNORMAL HIGH (ref 0.7–4.0)
MCH: 30.7 pg (ref 26.0–34.0)
MCHC: 33.2 g/dL (ref 30.0–36.0)
MCV: 92.4 fL (ref 80.0–100.0)
Monocytes Absolute: 0.7 10*3/uL (ref 0.1–1.0)
Monocytes Relative: 7 %
Neutro Abs: 5.8 10*3/uL (ref 1.7–7.7)
Neutrophils Relative %: 54 %
Platelets: 244 10*3/uL (ref 150–400)
RBC: 5.25 MIL/uL (ref 4.22–5.81)
RDW: 12.3 % (ref 11.5–15.5)
WBC: 10.9 10*3/uL — ABNORMAL HIGH (ref 4.0–10.5)
nRBC: 0 % (ref 0.0–0.2)

## 2022-06-07 LAB — COMPREHENSIVE METABOLIC PANEL
ALT: 15 U/L (ref 0–44)
AST: 14 U/L — ABNORMAL LOW (ref 15–41)
Albumin: 4.1 g/dL (ref 3.5–5.0)
Alkaline Phosphatase: 73 U/L (ref 38–126)
Anion gap: 11 (ref 5–15)
BUN: 18 mg/dL (ref 8–23)
CO2: 25 mmol/L (ref 22–32)
Calcium: 9 mg/dL (ref 8.9–10.3)
Chloride: 101 mmol/L (ref 98–111)
Creatinine, Ser: 1.04 mg/dL (ref 0.61–1.24)
GFR, Estimated: >60 mL/min (ref >60)
Glucose, Bld: 147 mg/dL — ABNORMAL HIGH (ref 70–99)
Potassium: 3.6 mmol/L (ref 3.5–5.1)
Sodium: 137 mmol/L (ref 135–145)
Total Bilirubin: 1 mg/dL (ref 0.3–1.2)
Total Protein: 7.6 g/dL (ref 6.5–8.1)

## 2022-06-07 LAB — MAGNESIUM: Magnesium: 2.1 mg/dL (ref 1.7–2.4)

## 2022-06-07 MED ORDER — HEPARIN SOD (PORK) LOCK FLUSH 100 UNIT/ML IV SOLN: 500.0000 [IU] | Freq: Once | INTRAVENOUS | Status: DC | PRN

## 2022-06-07 MED ORDER — OXALIPLATIN CHEMO INJECTION 100 MG/20ML
68.0000 mg/m2 | Freq: Once | INTRAVENOUS | Status: AC
  Administered 2022-06-07: 150 mg via INTRAVENOUS
  Filled 2022-06-07: qty 20

## 2022-06-07 MED ORDER — PROCHLORPERAZINE MALEATE 10 MG PO TABS
10.0000 mg | ORAL_TABLET | Freq: Four times a day (QID) | ORAL | 4 refills | Status: DC | PRN
Start: 2022-06-07 — End: 2022-08-03

## 2022-06-07 MED ORDER — FLUOROURACIL CHEMO INJECTION 2.5 GM/50ML
320.0000 mg/m2 | Freq: Once | INTRAVENOUS | Status: AC
  Administered 2022-06-07: 650 mg via INTRAVENOUS
  Filled 2022-06-07: qty 13

## 2022-06-07 MED ORDER — SODIUM CHLORIDE 0.9% FLUSH: 10.0000 mL | INTRAVENOUS | Status: DC | PRN

## 2022-06-07 MED ORDER — DEXTROSE 5 % IV SOLN: Freq: Once | INTRAVENOUS | Status: AC

## 2022-06-07 MED ORDER — LEUCOVORIN CALCIUM INJECTION 350 MG
320.0000 mg/m2 | Freq: Once | INTRAVENOUS | Status: AC
  Administered 2022-06-07: 640 mg via INTRAVENOUS
  Filled 2022-06-07: qty 32

## 2022-06-07 MED ORDER — PALONOSETRON HCL INJECTION 0.25 MG/5ML
0.2500 mg | Freq: Once | INTRAVENOUS | Status: AC
  Administered 2022-06-07: 0.25 mg via INTRAVENOUS
  Filled 2022-06-07: qty 5

## 2022-06-07 MED ORDER — SODIUM CHLORIDE 0.9 % IV SOLN
10.0000 mg | Freq: Once | INTRAVENOUS | Status: AC
  Administered 2022-06-07: 10 mg via INTRAVENOUS
  Filled 2022-06-07: qty 10

## 2022-06-07 MED ORDER — SODIUM CHLORIDE 0.9 % IV SOLN
1920.0000 mg/m2 | INTRAVENOUS | Status: DC
  Administered 2022-06-07: 3500 mg via INTRAVENOUS
  Filled 2022-06-07: qty 70

## 2022-06-07 MED ORDER — LIDOCAINE-PRILOCAINE 2.5-2.5 % EX CREA
TOPICAL_CREAM | CUTANEOUS | 3 refills | Status: DC
Start: 2022-06-07 — End: 2022-08-03

## 2022-06-07 NOTE — Progress Notes (Signed)
Patient has been examined by Dr. Ellin Saba. Vital signs and labs have been reviewed by MD - ANC, Creatinine, LFTs, hemoglobin, and platelets are within treatment parameters per M.D. - pt may proceed with treatment. All chemo at 80% dose today per MD. Primary RN and pharmacy notified.

## 2022-06-07 NOTE — Patient Instructions (Signed)
[facility]  Discharge Instructions: Thank you for choosing [facility] to provide your oncology and hematology care.  If you have a lab appointment with the Cancer Center - please note that after April 8th, 2024, all labs will be drawn in the cancer center.  You do not have to check in or register with the main entrance as you have in the past but will complete your check-in in the cancer center.  Wear comfortable clothing and clothing appropriate for easy access to any Portacath or PICC line.   We strive to give you quality time with your provider. You may need to reschedule your appointment if you arrive late (15 or more minutes).  Arriving late affects you and other patients whose appointments are after yours.  Also, if you miss three or more appointments without notifying the office, you may be dismissed from the clinic at the provider's discretion.      For prescription refill requests, have your pharmacy contact our office and allow 72 hours for refills to be completed.    Today you received the following chemotherapy and/or immunotherapy agents FOLFOX      To help prevent nausea and vomiting after your treatment, we encourage you to take your nausea medication as directed.  BELOW ARE SYMPTOMS THAT SHOULD BE REPORTED IMMEDIATELY: *FEVER GREATER THAN 100.4 F (38 C) OR HIGHER *CHILLS OR SWEATING *NAUSEA AND VOMITING THAT IS NOT CONTROLLED WITH YOUR NAUSEA MEDICATION *UNUSUAL SHORTNESS OF BREATH *UNUSUAL BRUISING OR BLEEDING *URINARY PROBLEMS (pain or burning when urinating, or frequent urination) *BOWEL PROBLEMS (unusual diarrhea, constipation, pain near the anus) TENDERNESS IN MOUTH AND THROAT WITH OR WITHOUT PRESENCE OF ULCERS (sore throat, sores in mouth, or a toothache) UNUSUAL RASH, SWELLING OR PAIN  UNUSUAL VAGINAL DISCHARGE OR ITCHING   Items with * indicate a potential emergency and should be followed up as soon as possible or go to the  Emergency Department if any problems should occur.  Please show the CHEMOTHERAPY ALERT CARD or IMMUNOTHERAPY ALERT CARD at check-in to the Emergency Department and triage nurse.  Should you have questions after your visit or need to cancel or reschedule your appointment, please contact [facility] AT [facility] [phone]  and follow the prompts.  Office hours are 8:00 a.m. to 4:30 p.m. Monday - Friday. Please note that voicemails left after 4:00 p.m. may not be returned until the following business day.  We are closed weekends and major holidays. You have access to a nurse at all times for urgent questions. Please call the main number to the clinic [phone] and follow the prompts.  For any non-urgent questions, you may also contact your provider using MyChart. We now offer e-Visits for anyone 61 and older to request care online for non-urgent symptoms. For details visit [email or web address].   Also download the MyChart app! Go to the app store, search "MyChart", open the app, select [facility], and log in with your MyChart username and password.

## 2022-06-07 NOTE — Progress Notes (Signed)
Patient presents today for D1C1 FOLFOX infusion with 5FU pump start per providers order.  Vital signs and labs reviewed by MD.  Message received from Chapman Moss RN/Dr. Ellin Saba patient okay for treatment.  Consent obtained and education on the 5FU pump given.  Treatment given today per MD orders.  Stable during infusion without adverse affects.  5FU pump started and verified RUN on the screen with the patient.  Vital signs stable.  No complaints at this time.  Discharge from clinic ambulatory in stable condition.  Alert and oriented X 3.  Follow up with [facility] as scheduled.

## 2022-06-07 NOTE — Patient Instructions (Signed)
[facility] at [facility] Discharge Instructions   You were seen and examined today by Dr. Katragadda.  He reviewed the results of your lab work which are normal/stable.   We will proceed with your treatment today.  Return as scheduled.    Thank you for choosing [facility] at [facility] to provide your oncology and hematology care.  To afford each patient quality time with our provider, please arrive at least 15 minutes before your scheduled appointment time.   If you have a lab appointment with the Cancer Center please come in thru the Main Entrance and check in at the main information desk.  You need to re-schedule your appointment should you arrive 10 or more minutes late.  We strive to give you quality time with our providers, and arriving late affects you and other patients whose appointments are after yours.  Also, if you no show three or more times for appointments you may be dismissed from the clinic at the providers discretion.     Again, thank you for choosing [facility].  Our hope is that these requests will decrease the amount of time that you wait before being seen by our physicians.       _____________________________________________________________  Should you have questions after your visit to  Cancer Center, please contact our office at [phone] and follow the prompts.  Our office hours are 8:00 a.m. and 4:30 p.m. Monday - Friday.  Please note that voicemails left after 4:00 p.m. may not be returned until the following business day.  We are closed weekends and major holidays.  You do have access to a nurse 24-7, just call the main number to the clinic [phone] and do not press any options, hold on the line and a nurse will answer the phone.    For prescription refill requests, have your pharmacy contact our office and allow 72 hours.    Due to Covid, you will need to wear a mask upon entering  the hospital. If you do not have a mask, a mask will be given to you at the Main Entrance upon arrival. For doctor visits, patients may have 1 support person age 18 or older with them. For treatment visits, patients can not have anyone with them due to social distancing guidelines and our immunocompromised population.      

## 2022-06-07 NOTE — Telephone Encounter (Signed)
I called patient to offer him a surgery date.  Patient states his oncologist does not think the surgery was necessary.  He would like to discuss this with him before proceeding with surgery.  Patient states he will call our office back.

## 2022-06-08 ENCOUNTER — Other Ambulatory Visit: Payer: PPO

## 2022-06-08 ENCOUNTER — Ambulatory Visit: Payer: PPO | Admitting: Hematology

## 2022-06-08 NOTE — Telephone Encounter (Signed)
FYI I informed patient of your response.  He states he still wants to postpone surgery for 3-4 months.  I scheduled pt a 3 month f/u with you on 08/23, he will discuss his decision at that time.

## 2022-06-09 ENCOUNTER — Other Ambulatory Visit: Payer: Self-pay

## 2022-06-09 ENCOUNTER — Inpatient Hospital Stay: Payer: PPO

## 2022-06-09 VITALS — BP 108/61 | HR 69 | Temp 96.9°F | Resp 20

## 2022-06-09 DIAGNOSIS — C20 Malignant neoplasm of rectum: Secondary | ICD-10-CM

## 2022-06-09 DIAGNOSIS — Z5111 Encounter for antineoplastic chemotherapy: Secondary | ICD-10-CM | POA: Diagnosis not present

## 2022-06-09 MED ORDER — HEPARIN SOD (PORK) LOCK FLUSH 100 UNIT/ML IV SOLN
500.0000 [IU] | Freq: Once | INTRAVENOUS | Status: AC | PRN
  Administered 2022-06-09: 500 [IU]

## 2022-06-09 MED ORDER — SODIUM CHLORIDE 0.9% FLUSH
10.0000 mL | INTRAVENOUS | Status: DC | PRN
  Administered 2022-06-09: 10 mL

## 2022-06-09 NOTE — Patient Instructions (Signed)
[facility] [place]  Discharge Instructions: Thank you for choosing [facility] to provide your oncology and hematology care.  If you have a lab appointment with the Cancer Center - please note that after April 8th, 2024, all labs will be drawn in the cancer center.  You do not have to check in or register with the main entrance as you have in the past but will complete your check-in in the cancer center.  Wear comfortable clothing and clothing appropriate for easy access to any Portacath or PICC line.   We strive to give you quality time with your provider. You may need to reschedule your appointment if you arrive late (15 or more minutes).  Arriving late affects you and other patients whose appointments are after yours.  Also, if you miss three or more appointments without notifying the office, you may be dismissed from the clinic at the provider's discretion.      For prescription refill requests, have your pharmacy contact our office and allow 72 hours for refills to be completed.  To help prevent nausea and vomiting after your treatment, we encourage you to take your nausea medication as directed.  BELOW ARE SYMPTOMS THAT SHOULD BE REPORTED IMMEDIATELY: *FEVER GREATER THAN 100.4 F (38 C) OR HIGHER *CHILLS OR SWEATING *NAUSEA AND VOMITING THAT IS NOT CONTROLLED WITH YOUR NAUSEA MEDICATION *UNUSUAL SHORTNESS OF BREATH *UNUSUAL BRUISING OR BLEEDING *URINARY PROBLEMS (pain or burning when urinating, or frequent urination) *BOWEL PROBLEMS (unusual diarrhea, constipation, pain near the anus) TENDERNESS IN MOUTH AND THROAT WITH OR WITHOUT PRESENCE OF ULCERS (sore throat, sores in mouth, or a toothache) UNUSUAL RASH, SWELLING OR PAIN  UNUSUAL VAGINAL DISCHARGE OR ITCHING   Items with * indicate a potential emergency and should be followed up as soon as possible or go to the Emergency Department if any problems should occur.  Please show the CHEMOTHERAPY ALERT CARD or  IMMUNOTHERAPY ALERT CARD at check-in to the Emergency Department and triage nurse.  Should you have questions after your visit or need to cancel or reschedule your appointment, please contact [facility] [place] [phone]  and follow the prompts.  Office hours are 8:00 a.m. to 4:30 p.m. Monday - Friday. Please note that voicemails left after 4:00 p.m. may not be returned until the following business day.  We are closed weekends and major holidays. You have access to a nurse at all times for urgent questions. Please call the main number to the clinic [phone] and follow the prompts.  For any non-urgent questions, you may also contact your provider using MyChart. We now offer e-Visits for anyone 18 and older to request care online for non-urgent symptoms. For details visit [email or web address].   Also download the MyChart app! Go to the app store, search "MyChart", open the app, select , and log in with your MyChart username and password.   

## 2022-06-09 NOTE — Progress Notes (Signed)
Patients port flushed without difficulty.  Good blood return noted with no bruising or swelling noted at site.  Home infusion 5FU pump disconnected with no issues.  Band aid applied.  VSS with discharge and left in satisfactory condition with no s/s of distress noted.   

## 2022-06-10 ENCOUNTER — Other Ambulatory Visit: Payer: Self-pay

## 2022-06-13 ENCOUNTER — Encounter: Payer: Self-pay | Admitting: Hematology

## 2022-06-13 NOTE — Telephone Encounter (Signed)
Late Entry: Patient presents called for 24-hr call back. Patient states he was constipated but relieved with medication. Patient states he feels good. Patient advised to call Cancer Center if other symptoms arise.

## 2022-06-20 NOTE — Progress Notes (Signed)
James Miles [address], [place] [address]    Clinic Day:  06/20/2022  Referring physician: [facility]  Patient Care Team: [facility] as PCP - General Therese Sarah, RN as Oncology Nurse Navigator (Medical Oncology) Doreatha Massed, MD as Medical Oncologist (Medical Oncology)   ASSESSMENT & PLAN:   Assessment: 1.  Stage II (T3 N0 M0) low rectal adenocarcinoma, MMR preserved: - 2-month history of intermittent rectal bleeding.  No weight loss. - Colonoscopy (04/26/2022): Large cauliflower mass palpated in the distal rectum on DRE.  Scattered diverticula in the descending colon.  5 mm polyp in the ascending colon.  In the rectum, exophytic semilunar neoplastic appearing process beginning at the anal verge and extending proximally about 6 cm. - Pathology (04/26/2022): Invasive moderately differentiated adenocarcinoma of the rectal mass.  MMR preserved. - CT CAP (05/01/2022): Nodular wall thickening along the rectum.  No intrinsic abnormal lymph nodes.  No liver lesion.  Fatty liver.  Nodule along the left side of the urinary bladder.  Small mass along the course of the distal left ureter.  Worrisome for multifocal TCC.  Small lung nodules measuring up to 4 mm. - MRI pelvis (05/03/2022): 6.4 cm left lateral low rectal tumor abutting the internal anal sphincter, T3c N0.  Distance from tumor to the internal anal sphincter is 0 cm.  Tumor measures 6.4 cm in length and up to 2.2 cm in thickness. - PET scan (05/11/2022): No evidence of metastatic disease. - Met with Dr. Maisie Fus on 05/16/2022: She is in agreement with TNT and has discussed APR with colostomy. - TNT: FOLFOX cycle 1 started on 06/07/2022   2.  Social/family history: -He lives at home and is independent of ADLs and IADLs.  He is accompanied by his son today.  Worked at [employer] prior to retirement.  Also served in [place] but denied any exposure to agent orange.  Quit  smoking 1 year ago.  Smoked 1 to 2 packs/day for the last 61 years.  Started at age 53. - No family history of malignancies.    Plan: 1.  Stage II (T3 cN0 M0) low rectal adenocarcinoma, MMR preserved: - Cycle 1 of FOLFOX with 20% dose reduction on 06/07/2022. - He denies any GI symptoms. - He had palmar erythema and cold sensitivity lasted few hours. - Labs today: Normal LFTs and electrolytes.  CBC grossly normal. - Proceed with cycle 2 with 100% dose.  RTC 2 weeks for follow-up.   2.  Left urinary bladder mass and distal left ureteral mass: - He underwent office cystoscopy by Dr. Ronne Binning which showed 3 cm left lateral wall bladder mass.  He is being scheduled for biopsy.    No orders of the defined types were placed in this encounter.     I,Katie Daubenspeck,acting as a Neurosurgeon for Doreatha Massed, MD.,have documented all relevant documentation on the behalf of Doreatha Massed, MD,as directed by  Doreatha Massed, MD while in the presence of Doreatha Massed, MD.   I, Doreatha Massed MD, have reviewed the above documentation for accuracy and completeness, and I agree with the above.   Mickie Bail   5/14/20247:11 PM  CHIEF COMPLAINT:   Diagnosis: Rectal adenocarcinoma   Cancer Staging  Rectal adenocarcinoma [facility]) Staging form: Colon and Rectum, AJCC 8th Edition - Clinical stage from 05/07/2022: Stage IIA (cT3, cN0, cM0) - Unsigned    Prior Therapy: none  Current Therapy:  Concurrent chemoradiation with FOLFOX, to  be followed by surgery    HISTORY OF PRESENT ILLNESS:   Oncology History  Rectal adenocarcinoma (HCC)  05/07/2022 Initial Diagnosis   Rectal adenocarcinoma (HCC)   06/07/2022 -  Chemotherapy   Patient is on Treatment Plan : COLORECTAL FOLFOX q14d x 4 months        INTERVAL HISTORY:   James Miles is a 77 y.o. male presenting to clinic today for follow up of Rectal adenocarcinoma. He was last seen by me on 06/07/22.  Today, he states that  he is doing well overall. His appetite level is at 100%. His energy level is at 50%.  PAST MEDICAL HISTORY:   Past Medical History: Past Medical History:  Diagnosis Date   Back pain    COPD (chronic obstructive pulmonary disease) (HCC)    Diabetes mellitus without complication (HCC)    Hyperlipidemia     Surgical History: Past Surgical History:  Procedure Laterality Date   BIOPSY  04/26/2022   Procedure: BIOPSY;  Surgeon: Corbin Ade, MD;  [facility];  Service: Endoscopy;;   COLONOSCOPY WITH PROPOFOL N/A 04/26/2022   Procedure: COLONOSCOPY WITH PROPOFOL;  Surgeon: Corbin Ade, MD;  Location: [facility];  Service: Endoscopy;  Laterality: N/A;  10:00am; ASA 3   hemorhoidectomy     IR IMAGING GUIDED PORT INSERTION  05/12/2022   POLYPECTOMY  04/26/2022   Procedure: POLYPECTOMY;  Surgeon: Corbin Ade, MD;  Location: [facility];  Service: Endoscopy;;    Social History: Social History   Socioeconomic History   Marital status: Widowed    Spouse name: Not on file   Number of children: 2   Years of education: Not on file   Highest education level: Not on file  Occupational History   Not on file  Tobacco Use   Smoking status: Former    Years: 50    Types: Cigarettes    Quit date: 08/2016    Years since quitting: 5.8   Smokeless tobacco: Never  Vaping Use   Vaping Use: Never used  Substance and Sexual Activity   Alcohol use: Not on file   Drug use: Never   Sexual activity: Not Currently  Other Topics Concern   Not on file  Social History Narrative   Not on file   Social Determinants of Health   Financial Resource Strain: Not on file  Food Insecurity: No Food Insecurity (05/08/2022)   Hunger Vital Sign    Worried About Running Out of Food in the Last Year: Never true    Ran Out of Food in the Last Year: Never true  Transportation Needs: No Transportation Needs (05/08/2022)   PRAPARE - Administrator, Civil Service (Medical): No     Lack of Transportation (Non-Medical): No  Physical Activity: Not on file  Stress: Not on file  Social Connections: Not on file  Intimate Partner Violence: Not At Risk (05/08/2022)   Humiliation, Afraid, Rape, and Kick questionnaire    Fear of Current or Ex-Partner: No    Emotionally Abused: No    Physically Abused: No    Sexually Abused: No    Family History: No family history on file.  Current Medications:  Current Outpatient Medications:    acidophilus (RISAQUAD) CAPS capsule, Take 1 capsule by mouth daily., Disp: , Rfl:    albuterol (VENTOLIN HFA) 108 (90 Base) MCG/ACT inhaler, Inhale 2 puffs into the lungs every 6 (six) hours as needed for wheezing or shortness of breath., Disp: ,  Rfl:    allopurinol (ZYLOPRIM) 300 MG tablet, Take 300 mg by mouth daily., Disp: , Rfl:    atorvastatin (LIPITOR) 20 MG tablet, Take 20 mg by mouth daily., Disp: , Rfl:    dextrose 5 % SOLN 1,000 mL with fluorouracil 5 GM/100ML SOLN, Inject into the vein., Disp: , Rfl:    JARDIANCE 10 MG TABS tablet, Take 20 mg by mouth daily., Disp: , Rfl:    LEUCOVORIN CALCIUM IV, Inject into the vein., Disp: , Rfl:    lidocaine-prilocaine (EMLA) cream, Apply 1 Application topically as needed., Disp: 30 g, Rfl: 0   lidocaine-prilocaine (EMLA) cream, Apply to affected area once, Disp: 30 g, Rfl: 3   lisinopril (ZESTRIL) 20 MG tablet, Take 20 mg by mouth daily., Disp: , Rfl:    loperamide (IMODIUM) 2 MG capsule, Take 2 mg by mouth 3 (three) times daily as needed for diarrhea or loose stools., Disp: , Rfl:    MELATONIN PO, Take 1 tablet by mouth at bedtime as needed (sleep)., Disp: , Rfl:    OXALIPLATIN IV, Inject into the vein., Disp: , Rfl:    prochlorperazine (COMPAZINE) 10 MG tablet, Take 1 tablet (10 mg total) by mouth every 6 (six) hours as needed for nausea or vomiting., Disp: 30 tablet, Rfl: 2   prochlorperazine (COMPAZINE) 10 MG tablet, Take 1 tablet (10 mg total) by mouth every 6 (six) hours as needed for nausea  or vomiting., Disp: 60 tablet, Rfl: 4   Allergies: No Known Allergies  REVIEW OF SYSTEMS:   Review of Systems  Constitutional:  Negative for chills, fatigue and fever.  HENT:   Negative for lump/mass, mouth sores, nosebleeds, sore throat and trouble swallowing.   Eyes:  Negative for eye problems.  Respiratory:  Positive for shortness of breath. Negative for cough.   Cardiovascular:  Negative for chest pain, leg swelling and palpitations.  Gastrointestinal:  Positive for blood in stool. Negative for abdominal pain, constipation, diarrhea, nausea and vomiting.  Genitourinary:  Negative for bladder incontinence, difficulty urinating, dysuria, frequency, hematuria and nocturia.   Musculoskeletal:  Negative for arthralgias, back pain, flank pain, myalgias and neck pain.  Skin:  Negative for itching and rash.  Neurological:  Negative for dizziness, headaches and numbness.  Hematological:  Does not bruise/bleed easily.  Psychiatric/Behavioral:  Negative for depression, sleep disturbance and suicidal ideas. The patient is not nervous/anxious.   All other systems reviewed and are negative.    VITALS:   There were no vitals taken for this visit.  Wt Readings from Last 3 Encounters:  06/07/22 173 lb 6.4 oz (78.7 kg)  05/23/22 176 lb 14.4 oz (80.2 kg)  05/12/22 176 lb (79.8 kg)    There is no height or weight on file to calculate BMI.  Performance status (ECOG): 1 - Symptomatic but completely ambulatory  PHYSICAL EXAM:   Physical Exam Vitals and nursing note reviewed. Exam conducted with a chaperone present.  Constitutional:      Appearance: Normal appearance.  Cardiovascular:     Rate and Rhythm: Normal rate and regular rhythm.     Pulses: Normal pulses.     Heart sounds: Normal heart sounds.  Pulmonary:     Effort: Pulmonary effort is normal.     Breath sounds: Normal breath sounds.  Abdominal:     Palpations: Abdomen is soft. There is no hepatomegaly, splenomegaly or mass.      Tenderness: There is no abdominal tenderness.  Musculoskeletal:     Right lower leg:  No edema.     Left lower leg: No edema.  Lymphadenopathy:     Cervical: No cervical adenopathy.     Right cervical: No superficial, deep or posterior cervical adenopathy.    Left cervical: No superficial, deep or posterior cervical adenopathy.     Upper Body:     Right upper body: No supraclavicular or axillary adenopathy.     Left upper body: No supraclavicular or axillary adenopathy.  Neurological:     General: No focal deficit present.     Mental Status: He is alert and oriented to person, place, and time.  Psychiatric:        Mood and Affect: Mood normal.        Behavior: Behavior normal.     LABS:      Latest Ref Rng & Units 06/07/2022    8:01 AM 04/26/2022   12:55 PM 10/03/2018    1:43 AM  CBC  WBC 4.0 - 10.5 K/uL 10.9  8.9  9.2      Hemoglobin 13.0 - 17.0 g/dL 57.8  46.9  62.9      Hematocrit 39.0 - 52.0 % 48.5  48.4  50      Platelets 150 - 400 K/uL 244  201  216         This result is from an external source.      Latest Ref Rng & Units 06/07/2022    8:01 AM 04/26/2022   12:55 PM 04/21/2022   10:01 AM  CMP  Glucose 70 - 99 mg/dL 528  413  244   BUN 8 - 23 mg/dL 18  7  13    Creatinine 0.61 - 1.24 mg/dL 0.10  2.72  5.36   Sodium 135 - 145 mmol/L 137  135  140   Potassium 3.5 - 5.1 mmol/L 3.6  3.5  3.5   Chloride 98 - 111 mmol/L 101  102  105   CO2 22 - 32 mmol/L 25  26  24    Calcium 8.9 - 10.3 mg/dL 9.0  8.4  9.1   Total Protein 6.5 - 8.1 g/dL 7.6  7.0    Total Bilirubin 0.3 - 1.2 mg/dL 1.0  0.5    Alkaline Phos 38 - 126 U/L 73  69    AST 15 - 41 U/L 14  21    ALT 0 - 44 U/L 15  26       Lab Results  Component Value Date   CEA1 2.6 04/26/2022   /  CEA  Date Value Ref Range Status  04/26/2022 2.6 0.0 - 4.7 ng/mL Final    Comment:    (NOTE)                             Nonsmokers          <3.9                             Smokers             <5.6 [employer]  Electrochemiluminescence Immunoassay (ECLIA) Values obtained with different assay methods or kits cannot be used interchangeably.  Results cannot be interpreted as absolute evidence of the presence or absence of malignant disease. Performed At: [facility] [address] [place], [place] [id number] Jolene Schimke MD VZ:[id number]    No results found for: "PSA1" No results found for: "IRJ188" No  results found for: "CAN125"  No results found for: "TOTALPROTELP", "ALBUMINELP", "A1GS", "A2GS", "BETS", "BETA2SER", "GAMS", "MSPIKE", "SPEI" No results found for: "TIBC", "FERRITIN", "IRONPCTSAT" No results found for: "LDH"   STUDIES:   No results found.

## 2022-06-21 ENCOUNTER — Inpatient Hospital Stay: Payer: PPO

## 2022-06-21 ENCOUNTER — Inpatient Hospital Stay (HOSPITAL_BASED_OUTPATIENT_CLINIC_OR_DEPARTMENT_OTHER): Payer: PPO | Admitting: Hematology

## 2022-06-21 VITALS — BP 110/66 | HR 58 | Temp 96.8°F | Resp 18

## 2022-06-21 VITALS — BP 94/66 | HR 78 | Temp 97.3°F | Resp 20 | Wt 178.4 lb

## 2022-06-21 DIAGNOSIS — C20 Malignant neoplasm of rectum: Secondary | ICD-10-CM

## 2022-06-21 DIAGNOSIS — Z5111 Encounter for antineoplastic chemotherapy: Secondary | ICD-10-CM | POA: Diagnosis not present

## 2022-06-21 DIAGNOSIS — Z95828 Presence of other vascular implants and grafts: Secondary | ICD-10-CM

## 2022-06-21 LAB — CBC WITH DIFFERENTIAL/PLATELET
Abs Immature Granulocytes: 0.02 10*3/uL (ref 0.00–0.07)
Basophils Absolute: 0 10*3/uL (ref 0.0–0.1)
Basophils Relative: 0 %
Eosinophils Absolute: 0.2 10*3/uL (ref 0.0–0.5)
Eosinophils Relative: 2 %
HCT: 47 % (ref 39.0–52.0)
Hemoglobin: 15.5 g/dL (ref 13.0–17.0)
Immature Granulocytes: 0 %
Lymphocytes Relative: 31 %
Lymphs Abs: 2.6 10*3/uL (ref 0.7–4.0)
MCH: 30.6 pg (ref 26.0–34.0)
MCHC: 33 g/dL (ref 30.0–36.0)
MCV: 92.7 fL (ref 80.0–100.0)
Monocytes Absolute: 0.7 10*3/uL (ref 0.1–1.0)
Monocytes Relative: 9 %
Neutro Abs: 4.8 10*3/uL (ref 1.7–7.7)
Neutrophils Relative %: 58 %
Platelets: 205 10*3/uL (ref 150–400)
RBC: 5.07 MIL/uL (ref 4.22–5.81)
RDW: 12.6 % (ref 11.5–15.5)
WBC: 8.4 10*3/uL (ref 4.0–10.5)
nRBC: 0 % (ref 0.0–0.2)

## 2022-06-21 LAB — COMPREHENSIVE METABOLIC PANEL
ALT: 20 U/L (ref 0–44)
AST: 17 U/L (ref 15–41)
Albumin: 4 g/dL (ref 3.5–5.0)
Alkaline Phosphatase: 61 U/L (ref 38–126)
Anion gap: 11 (ref 5–15)
BUN: 10 mg/dL (ref 8–23)
CO2: 25 mmol/L (ref 22–32)
Calcium: 8.8 mg/dL — ABNORMAL LOW (ref 8.9–10.3)
Chloride: 103 mmol/L (ref 98–111)
Creatinine, Ser: 0.83 mg/dL (ref 0.61–1.24)
GFR, Estimated: >60 mL/min (ref >60)
Glucose, Bld: 140 mg/dL — ABNORMAL HIGH (ref 70–99)
Potassium: 3.9 mmol/L (ref 3.5–5.1)
Sodium: 139 mmol/L (ref 135–145)
Total Bilirubin: 0.6 mg/dL (ref 0.3–1.2)
Total Protein: 7 g/dL (ref 6.5–8.1)

## 2022-06-21 LAB — MAGNESIUM: Magnesium: 2.1 mg/dL (ref 1.7–2.4)

## 2022-06-21 MED ORDER — FLUOROURACIL CHEMO INJECTION 2.5 GM/50ML
400.0000 mg/m2 | Freq: Once | INTRAVENOUS | Status: AC
  Administered 2022-06-21: 800 mg via INTRAVENOUS
  Filled 2022-06-21: qty 16

## 2022-06-21 MED ORDER — PALONOSETRON HCL INJECTION 0.25 MG/5ML
0.2500 mg | Freq: Once | INTRAVENOUS | Status: AC
  Administered 2022-06-21: 0.25 mg via INTRAVENOUS
  Filled 2022-06-21: qty 5

## 2022-06-21 MED ORDER — LEUCOVORIN CALCIUM INJECTION 350 MG
400.0000 mg/m2 | Freq: Once | INTRAVENOUS | Status: AC
  Administered 2022-06-21: 800 mg via INTRAVENOUS
  Filled 2022-06-21: qty 40

## 2022-06-21 MED ORDER — SODIUM CHLORIDE 0.9 % IV SOLN
2400.0000 mg/m2 | INTRAVENOUS | Status: DC
  Administered 2022-06-21: 5000 mg via INTRAVENOUS
  Filled 2022-06-21: qty 100

## 2022-06-21 MED ORDER — OXALIPLATIN CHEMO INJECTION 100 MG/20ML
85.0000 mg/m2 | Freq: Once | INTRAVENOUS | Status: AC
  Administered 2022-06-21: 170 mg via INTRAVENOUS
  Filled 2022-06-21: qty 20

## 2022-06-21 MED ORDER — DEXTROSE 5 % IV SOLN: Freq: Once | INTRAVENOUS | Status: AC

## 2022-06-21 MED ORDER — SODIUM CHLORIDE 0.9 % IV SOLN
10.0000 mg | Freq: Once | INTRAVENOUS | Status: AC
  Administered 2022-06-21: 10 mg via INTRAVENOUS
  Filled 2022-06-21: qty 1

## 2022-06-21 MED ORDER — SODIUM CHLORIDE 0.9% FLUSH
10.0000 mL | Freq: Once | INTRAVENOUS | Status: AC
  Administered 2022-06-21: 10 mL via INTRAVENOUS

## 2022-06-21 NOTE — Progress Notes (Signed)
Patient has been examined by Dr. Ellin Saba. Vital signs and labs have been reviewed by MD - ANC, Creatinine, LFTs, hemoglobin, and platelets are within treatment parameters per M.D. - pt may proceed with treatment.  Will receive full dose chemotherapy today per MD. Primary RN and pharmacy notified.

## 2022-06-21 NOTE — Progress Notes (Signed)
Patient presents today for chemotherapy infusion. Patient is in satisfactory condition with no new complaints voiced.  Vital signs are stable.  Labs reviewed by Dr. Ellin Saba during the office visit and all labs are within treatment parameters.  We will proceed with treatment per MD orders.   Patient tolerated treatment well with no complaints voiced.  Home infusion 5FU pump connected with no issues.  Patient left ambulatory with grandson in stable condition.  Vital signs stable at discharge.  Follow up as scheduled.

## 2022-06-21 NOTE — Patient Instructions (Signed)
[facility]  Discharge Instructions: Thank you for choosing [facility] to provide your oncology and hematology care.  If you have a lab appointment with the Cancer Center - please note that after April 8th, 2024, all labs will be drawn in the cancer center.  You do not have to check in or register with the main entrance as you have in the past but will complete your check-in in the cancer center.  Wear comfortable clothing and clothing appropriate for easy access to any Portacath or PICC line.   We strive to give you quality time with your provider. You may need to reschedule your appointment if you arrive late (15 or more minutes).  Arriving late affects you and other patients whose appointments are after yours.  Also, if you miss three or more appointments without notifying the office, you may be dismissed from the clinic at the provider's discretion.      For prescription refill requests, have your pharmacy contact our office and allow 72 hours for refills to be completed.    Today you received the following chemotherapy and/or immunotherapy agents Leucovorin/Oxaliplatin/5FU.  Oxaliplatin Injection What is this medication? OXALIPLATIN (ox AL i PLA tin) treats colorectal cancer. It works by slowing down the growth of cancer cells. This medicine may be used for other purposes; ask your health care provider or pharmacist if you have questions. COMMON BRAND NAME(S): Eloxatin What should I tell my care team before I take this medication? They need to know if you have any of these conditions: Heart disease History of irregular heartbeat or rhythm Liver disease Low blood cell levels (white cells, red cells, and platelets) Lung or breathing disease, such as asthma Take medications that treat or prevent blood clots Tingling of the fingers, toes, or other nerve disorder An unusual or allergic reaction to oxaliplatin, other medications, foods, dyes, or  preservatives If you or your partner are pregnant or trying to get pregnant Breast-feeding How should I use this medication? This medication is injected into a vein. It is given by your care team in a hospital or clinic setting. Talk to your care team about the use of this medication in children. Special care may be needed. Overdosage: If you think you have taken too much of this medicine contact a poison control center or emergency room at once. NOTE: This medicine is only for you. Do not share this medicine with others. What if I miss a dose? Keep appointments for follow-up doses. It is important not to miss a dose. Call your care team if you are unable to keep an appointment. What may interact with this medication? Do not take this medication with any of the following: Cisapride Dronedarone Pimozide Thioridazine This medication may also interact with the following: Aspirin and aspirin-like medications Certain medications that treat or prevent blood clots, such as warfarin, apixaban, dabigatran, and rivaroxaban Cisplatin Cyclosporine Diuretics Medications for infection, such as acyclovir, adefovir, amphotericin B, bacitracin, cidofovir, foscarnet, ganciclovir, gentamicin, pentamidine, vancomycin NSAIDs, medications for pain and inflammation, such as ibuprofen or naproxen Other medications that cause heart rhythm changes Pamidronate Zoledronic acid This list may not describe all possible interactions. Give your health care provider a list of all the medicines, herbs, non-prescription drugs, or dietary supplements you use. Also tell them if you smoke, drink alcohol, or use illegal drugs. Some items may interact with your medicine. What should I watch for while using this medication? Your condition will be monitored carefully while  you are receiving this medication. You may need blood work while taking this medication. This medication may make you feel generally unwell. This is not  uncommon as chemotherapy can affect healthy cells as well as cancer cells. Report any side effects. Continue your course of treatment even though you feel ill unless your care team tells you to stop. This medication may increase your risk of getting an infection. Call your care team for advice if you get a fever, chills, sore throat, or other symptoms of a cold or flu. Do not treat yourself. Try to avoid being around people who are sick. Avoid taking medications that contain aspirin, acetaminophen, ibuprofen, naproxen, or ketoprofen unless instructed by your care team. These medications may hide a fever. Be careful brushing or flossing your teeth or using a toothpick because you may get an infection or bleed more easily. If you have any dental work done, tell your dentist you are receiving this medication. This medication can make you more sensitive to cold. Do not drink cold drinks or use ice. Cover exposed skin before coming in contact with cold temperatures or cold objects. When out in cold weather wear warm clothing and cover your mouth and nose to warm the air that goes into your lungs. Tell your care team if you get sensitive to the cold. Talk to your care team if you or your partner are pregnant or think either of you might be pregnant. This medication can cause serious birth defects if taken during pregnancy and for 9 months after the last dose. A negative pregnancy test is required before starting this medication. A reliable form of contraception is recommended while taking this medication and for 9 months after the last dose. Talk to your care team about effective forms of contraception. Do not father a child while taking this medication and for 6 months after the last dose. Use a condom while having sex during this time period. Do not breastfeed while taking this medication and for 3 months after the last dose. This medication may cause infertility. Talk to your care team if you are concerned about  your fertility. What side effects may I notice from receiving this medication? Side effects that you should report to your care team as soon as possible: Allergic reactions--skin rash, itching, hives, swelling of the face, lips, tongue, or throat Bleeding--bloody or black, tar-like stools, vomiting blood or Adriaan Maltese material that looks like coffee grounds, red or dark Karista Aispuro urine, small red or purple spots on skin, unusual bruising or bleeding Dry cough, shortness of breath or trouble breathing Heart rhythm changes--fast or irregular heartbeat, dizziness, feeling faint or lightheaded, chest pain, trouble breathing Infection--fever, chills, cough, sore throat, wounds that don't heal, pain or trouble when passing urine, general feeling of discomfort or being unwell Liver injury--right upper belly pain, loss of appetite, nausea, light-colored stool, dark yellow or Allegra Cerniglia urine, yellowing skin or eyes, unusual weakness or fatigue Low red blood cell level--unusual weakness or fatigue, dizziness, headache, trouble breathing Muscle injury--unusual weakness or fatigue, muscle pain, dark yellow or Namir Neto urine, decrease in amount of urine Pain, tingling, or numbness in the hands or feet Sudden and severe headache, confusion, change in vision, seizures, which may be signs of posterior reversible encephalopathy syndrome (PRES) Unusual bruising or bleeding Side effects that usually do not require medical attention (report to your care team if they continue or are bothersome): Diarrhea Nausea Pain, redness, or swelling with sores inside the mouth or throat Unusual weakness or   fatigue Vomiting This list may not describe all possible side effects. Call your doctor for medical advice about side effects. You may report side effects to FDA at 1-800-FDA-1088. Where should I keep my medication? This medication is given in a hospital or clinic. It will not be stored at home. NOTE: This sheet is a summary. It may not  cover all possible information. If you have questions about this medicine, talk to your doctor, pharmacist, or health care provider.  2023 Elsevier/Gold Standard (2007-03-16 00:00:00)    Leucovorin Injection What is this medication? LEUCOVORIN (loo koe VOR in) prevents side effects from certain medications, such as methotrexate. It works by increasing folate levels. This helps protect healthy cells in your body. It may also be used to treat anemia caused by low levels of folate. It can also be used with fluorouracil, a type of chemotherapy, to treat colorectal cancer. It works by increasing the effects of fluorouracil in the body. This medicine may be used for other purposes; ask your health care provider or pharmacist if you have questions. What should I tell my care team before I take this medication? They need to know if you have any of these conditions: Anemia from low levels of vitamin B12 in the blood An unusual or allergic reaction to leucovorin, folic acid, other medications, foods, dyes, or preservatives Pregnant or trying to get pregnant Breastfeeding How should I use this medication? This medication is injected into a vein or a muscle. It is given by your care team in a hospital or clinic setting. Talk to your care team about the use of this medication in children. Special care may be needed. Overdosage: If you think you have taken too much of this medicine contact a poison control center or emergency room at once. NOTE: This medicine is only for you. Do not share this medicine with others. What if I miss a dose? Keep appointments for follow-up doses. It is important not to miss your dose. Call your care team if you are unable to keep an appointment. What may interact with this medication? Capecitabine Fluorouracil Phenobarbital Phenytoin Primidone Trimethoprim;sulfamethoxazole This list may not describe all possible interactions. Give your health care provider a list of all  the medicines, herbs, non-prescription drugs, or dietary supplements you use. Also tell them if you smoke, drink alcohol, or use illegal drugs. Some items may interact with your medicine. What should I watch for while using this medication? Your condition will be monitored carefully while you are receiving this medication. This medication may increase the side effects of 5-fluorouracil. Tell your care team if you have diarrhea or mouth sores that do not get better or that get worse. What side effects may I notice from receiving this medication? Side effects that you should report to your care team as soon as possible: Allergic reactions--skin rash, itching, hives, swelling of the face, lips, tongue, or throat This list may not describe all possible side effects. Call your doctor for medical advice about side effects. You may report side effects to FDA at 1-800-FDA-1088. Where should I keep my medication? This medication is given in a hospital or clinic. It will not be stored at home. NOTE: This sheet is a summary. It may not cover all possible information. If you have questions about this medicine, talk to your doctor, pharmacist, or health care provider.  2023 Elsevier/Gold Standard (2021-06-03 00:00:00)    Fluorouracil Injection What is this medication? FLUOROURACIL (flure oh YOOR a sil) treats some types  of cancer. It works by slowing down the growth of cancer cells. This medicine may be used for other purposes; ask your health care provider or pharmacist if you have questions. COMMON BRAND NAME(S): Adrucil What should I tell my care team before I take this medication? They need to know if you have any of these conditions: Blood disorders Dihydropyrimidine dehydrogenase (DPD) deficiency Infection, such as chickenpox, cold sores, herpes Kidney disease Liver disease Poor nutrition Recent or ongoing radiation therapy An unusual or allergic reaction to fluorouracil, other medications,  foods, dyes, or preservatives If you or your partner are pregnant or trying to get pregnant Breast-feeding How should I use this medication? This medication is injected into a vein. It is administered by your care team in a hospital or clinic setting. Talk to your care team about the use of this medication in children. Special care may be needed. Overdosage: If you think you have taken too much of this medicine contact a poison control center or emergency room at once. NOTE: This medicine is only for you. Do not share this medicine with others. What if I miss a dose? Keep appointments for follow-up doses. It is important not to miss your dose. Call your care team if you are unable to keep an appointment. What may interact with this medication? Do not take this medication with any of the following: Live virus vaccines This medication may also interact with the following: Medications that treat or prevent blood clots, such as warfarin, enoxaparin, dalteparin This list may not describe all possible interactions. Give your health care provider a list of all the medicines, herbs, non-prescription drugs, or dietary supplements you use. Also tell them if you smoke, drink alcohol, or use illegal drugs. Some items may interact with your medicine. What should I watch for while using this medication? Your condition will be monitored carefully while you are receiving this medication. This medication may make you feel generally unwell. This is not uncommon as chemotherapy can affect healthy cells as well as cancer cells. Report any side effects. Continue your course of treatment even though you feel ill unless your care team tells you to stop. In some cases, you may be given additional medications to help with side effects. Follow all directions for their use. This medication may increase your risk of getting an infection. Call your care team for advice if you get a fever, chills, sore throat, or other  symptoms of a cold or flu. Do not treat yourself. Try to avoid being around people who are sick. This medication may increase your risk to bruise or bleed. Call your care team if you notice any unusual bleeding. Be careful brushing or flossing your teeth or using a toothpick because you may get an infection or bleed more easily. If you have any dental work done, tell your dentist you are receiving this medication. Avoid taking medications that contain aspirin, acetaminophen, ibuprofen, naproxen, or ketoprofen unless instructed by your care team. These medications may hide a fever. Do not treat diarrhea with over the counter products. Contact your care team if you have diarrhea that lasts more than 2 days or if it is severe and watery. This medication can make you more sensitive to the sun. Keep out of the sun. If you cannot avoid being in the sun, wear protective clothing and sunscreen. Do not use sun lamps, tanning beds, or tanning booths. Talk to your care team if you or your partner wish to become pregnant or think  you might be pregnant. This medication can cause serious birth defects if taken during pregnancy and for 3 months after the last dose. A reliable form of contraception is recommended while taking this medication and for 3 months after the last dose. Talk to your care team about effective forms of contraception. Do not father a child while taking this medication and for 3 months after the last dose. Use a condom while having sex during this time period. Do not breastfeed while taking this medication. This medication may cause infertility. Talk to your care team if you are concerned about your fertility. What side effects may I notice from receiving this medication? Side effects that you should report to your care team as soon as possible: Allergic reactions--skin rash, itching, hives, swelling of the face, lips, tongue, or throat Heart attack--pain or tightness in the chest, shoulders, arms,  or jaw, nausea, shortness of breath, cold or clammy skin, feeling faint or lightheaded Heart failure--shortness of breath, swelling of the ankles, feet, or hands, sudden weight gain, unusual weakness or fatigue Heart rhythm changes--fast or irregular heartbeat, dizziness, feeling faint or lightheaded, chest pain, trouble breathing High ammonia level--unusual weakness or fatigue, confusion, loss of appetite, nausea, vomiting, seizures Infection--fever, chills, cough, sore throat, wounds that don't heal, pain or trouble when passing urine, general feeling of discomfort or being unwell Low red blood cell level--unusual weakness or fatigue, dizziness, headache, trouble breathing Pain, tingling, or numbness in the hands or feet, muscle weakness, change in vision, confusion or trouble speaking, loss of balance or coordination, trouble walking, seizures Redness, swelling, and blistering of the skin over hands and feet Severe or prolonged diarrhea Unusual bruising or bleeding Side effects that usually do not require medical attention (report to your care team if they continue or are bothersome): Dry skin Headache Increased tears Nausea Pain, redness, or swelling with sores inside the mouth or throat Sensitivity to light Vomiting This list may not describe all possible side effects. Call your doctor for medical advice about side effects. You may report side effects to FDA at 1-800-FDA-1088. Where should I keep my medication? This medication is given in a hospital or clinic. It will not be stored at home. NOTE: This sheet is a summary. It may not cover all possible information. If you have questions about this medicine, talk to your doctor, pharmacist, or health care provider.  2023 Elsevier/Gold Standard (2021-05-24 00:00:00)        To help prevent nausea and vomiting after your treatment, we encourage you to take your nausea medication as directed.  BELOW ARE SYMPTOMS THAT SHOULD BE REPORTED  IMMEDIATELY: *FEVER GREATER THAN 100.4 F (38 C) OR HIGHER *CHILLS OR SWEATING *NAUSEA AND VOMITING THAT IS NOT CONTROLLED WITH YOUR NAUSEA MEDICATION *UNUSUAL SHORTNESS OF BREATH *UNUSUAL BRUISING OR BLEEDING *URINARY PROBLEMS (pain or burning when urinating, or frequent urination) *BOWEL PROBLEMS (unusual diarrhea, constipation, pain near the anus) TENDERNESS IN MOUTH AND THROAT WITH OR WITHOUT PRESENCE OF ULCERS (sore throat, sores in mouth, or a toothache) UNUSUAL RASH, SWELLING OR PAIN  UNUSUAL VAGINAL DISCHARGE OR ITCHING   Items with * indicate a potential emergency and should be followed up as soon as possible or go to the Emergency Department if any problems should occur.  Please show the CHEMOTHERAPY ALERT CARD or IMMUNOTHERAPY ALERT CARD at check-in to the Emergency Department and triage nurse.  Should you have questions after your visit or need to cancel or reschedule your appointment, please contact [facility]  [place] [phone]  and follow the prompts.  Office hours are 8:00 a.m. to 4:30 p.m. Monday - Friday. Please note that voicemails left after 4:00 p.m. may not be returned until the following business day.  We are closed weekends and major holidays. You have access to a nurse at all times for urgent questions. Please call the main number to the clinic [phone] and follow the prompts.  For any non-urgent questions, you may also contact your provider using MyChart. We now offer e-Visits for anyone 41 and older to request care online for non-urgent symptoms. For details visit [email or web address].   Also download the MyChart app! Go to the app store, search "MyChart", open the app, select [facility], and log in with your MyChart username and password.

## 2022-06-21 NOTE — Patient Instructions (Signed)
[facility] Discharge Instructions   You were seen and examined today by Dr. Katragadda.  He reviewed the results of your lab work which are normal/stable.   We will proceed with your treatment today.  Return as scheduled.    Thank you for choosing [facility] to provide your oncology and hematology care.  To afford each patient quality time with our provider, please arrive at least 15 minutes before your scheduled appointment time.   If you have a lab appointment with the Cancer Center please come in thru the Main Entrance and check in at the main information desk.  You need to re-schedule your appointment should you arrive 10 or more minutes late.  We strive to give you quality time with our providers, and arriving late affects you and other patients whose appointments are after yours.  Also, if you no show three or more times for appointments you may be dismissed from the clinic at the providers discretion.     Again, thank you for choosing [facility].  Our hope is that these requests will decrease the amount of time that you wait before being seen by our physicians.       _____________________________________________________________  Should you have questions after your visit to [facility], please contact our office at [phone] and follow the prompts.  Our office hours are 8:00 a.m. and 4:30 p.m. Monday - Friday.  Please note that voicemails left after 4:00 p.m. may not be returned until the following business day.  We are closed weekends and major holidays.  You do have access to a nurse 24-7, just call the main number to the clinic [phone] and do not press any options, hold on the line and a nurse will answer the phone.    For prescription refill requests, have your pharmacy contact our office and allow 72 hours.    Due to Covid, you will need to wear a mask upon entering  the hospital. If you do not have a mask, a mask will be given to you at the Main Entrance upon arrival. For doctor visits, patients may have 1 support person age 18 or older with them. For treatment visits, patients can not have anyone with them due to social distancing guidelines and our immunocompromised population.      

## 2022-06-23 ENCOUNTER — Inpatient Hospital Stay: Payer: PPO

## 2022-06-23 VITALS — BP 120/65 | HR 74 | Temp 97.8°F | Resp 17

## 2022-06-23 DIAGNOSIS — C20 Malignant neoplasm of rectum: Secondary | ICD-10-CM

## 2022-06-23 DIAGNOSIS — Z5111 Encounter for antineoplastic chemotherapy: Secondary | ICD-10-CM | POA: Diagnosis not present

## 2022-06-23 MED ORDER — HEPARIN SOD (PORK) LOCK FLUSH 100 UNIT/ML IV SOLN
500.0000 [IU] | Freq: Once | INTRAVENOUS | Status: AC | PRN
  Administered 2022-06-23: 500 [IU]

## 2022-06-23 MED ORDER — SODIUM CHLORIDE 0.9% FLUSH
10.0000 mL | INTRAVENOUS | Status: DC | PRN
  Administered 2022-06-23: 10 mL

## 2022-06-23 NOTE — Progress Notes (Signed)
Patients port flushed without difficulty.  Good blood return noted with no bruising or swelling noted at site.  Home infusion 5FU pump disconnected with no issues.  Band aid applied.  VSS with discharge and left in satisfactory condition with no s/s of distress noted.   

## 2022-06-23 NOTE — Patient Instructions (Signed)
[facility]  Discharge Instructions: Thank you for choosing [facility] to provide your oncology and hematology care.  If you have a lab appointment with the Cancer Center - please note that after April 8th, 2024, all labs will be drawn in the cancer center.  You do not have to check in or register with the main entrance as you have in the past but will complete your check-in in the cancer center.  Wear comfortable clothing and clothing appropriate for easy access to any Portacath or PICC line.   We strive to give you quality time with your provider. You may need to reschedule your appointment if you arrive late (15 or more minutes).  Arriving late affects you and other patients whose appointments are after yours.  Also, if you miss three or more appointments without notifying the office, you may be dismissed from the clinic at the provider's discretion.      For prescription refill requests, have your pharmacy contact our office and allow 72 hours for refills to be completed.  To help prevent nausea and vomiting after your treatment, we encourage you to take your nausea medication as directed.  BELOW ARE SYMPTOMS THAT SHOULD BE REPORTED IMMEDIATELY: *FEVER GREATER THAN 100.4 F (38 C) OR HIGHER *CHILLS OR SWEATING *NAUSEA AND VOMITING THAT IS NOT CONTROLLED WITH YOUR NAUSEA MEDICATION *UNUSUAL SHORTNESS OF BREATH *UNUSUAL BRUISING OR BLEEDING *URINARY PROBLEMS (pain or burning when urinating, or frequent urination) *BOWEL PROBLEMS (unusual diarrhea, constipation, pain near the anus) TENDERNESS IN MOUTH AND THROAT WITH OR WITHOUT PRESENCE OF ULCERS (sore throat, sores in mouth, or a toothache) UNUSUAL RASH, SWELLING OR PAIN  UNUSUAL VAGINAL DISCHARGE OR ITCHING   Items with * indicate a potential emergency and should be followed up as soon as possible or go to the Emergency Department if any problems should occur.  Please show the CHEMOTHERAPY ALERT CARD or  IMMUNOTHERAPY ALERT CARD at check-in to the Emergency Department and triage nurse.  Should you have questions after your visit or need to cancel or reschedule your appointment, please contact [facility] [phone]  and follow the prompts.  Office hours are 8:00 a.m. to 4:30 p.m. Monday - Friday. Please note that voicemails left after 4:00 p.m. may not be returned until the following business day.  We are closed weekends and major holidays. You have access to a nurse at all times for urgent questions. Please call the main number to the clinic [phone] and follow the prompts.  For any non-urgent questions, you may also contact your provider using MyChart. We now offer e-Visits for anyone 18 and older to request care online for non-urgent symptoms. For details visit [email or web address].   Also download the MyChart app! Go to the app store, search "MyChart", open the app, select [facility], and log in with your MyChart username and password.   

## 2022-07-04 NOTE — Progress Notes (Signed)
[facility] [address], [place] [address]    Clinic Day:  07/05/2022  Referring physician: Ponciano Ort [name]  Patient Care Team: [place], [facility] as PCP - General Therese Sarah, RN as Oncology Nurse Navigator (Medical Oncology) Doreatha Massed, MD as Medical Oncologist (Medical Oncology)   ASSESSMENT & PLAN:   Assessment: 1.  Stage II (T3 N0 M0) low rectal adenocarcinoma, MMR preserved: - 12-month history of intermittent rectal bleeding.  No weight loss. - Colonoscopy (04/26/2022): Large cauliflower mass palpated in the distal rectum on DRE.  Scattered diverticula in the descending colon.  5 mm polyp in the ascending colon.  In the rectum, exophytic semilunar neoplastic appearing process beginning at the anal verge and extending proximally about 6 cm. - Pathology (04/26/2022): Invasive moderately differentiated adenocarcinoma of the rectal mass.  MMR preserved. - CT CAP (05/01/2022): Nodular wall thickening along the rectum.  No intrinsic abnormal lymph nodes.  No liver lesion.  Fatty liver.  Nodule along the left side of the urinary bladder.  Small mass along the course of the distal left ureter.  Worrisome for multifocal TCC.  Small lung nodules measuring up to 4 mm. - MRI pelvis (05/03/2022): 6.4 cm left lateral low rectal tumor abutting the internal anal sphincter, T3c N0.  Distance from tumor to the internal anal sphincter is 0 cm.  Tumor measures 6.4 cm in length and up to 2.2 cm in thickness. - PET scan (05/11/2022): No evidence of metastatic disease. - Met with Dr. Maisie Fus on 05/16/2022: She is in agreement with TNT and has discussed APR with colostomy. - TNT: FOLFOX cycle 1 started on 06/07/2022   2.  Social/family history: -He lives at home and is independent of ADLs and IADLs.  He is accompanied by his son today.  Worked at [employer] prior to retirement.  Also served in [place] but denied any exposure to agent orange.  Quit  smoking 1 year ago.  Smoked 1 to 2 packs/day for the last 61 years.  Started at age 65. - No family history of malignancies.    Plan: 1.  Stage II (T3 cN0 M0) low rectal adenocarcinoma, MMR preserved: - Cycle 2 of FOLFOX on 06/21/2022 at 100% dose. - She felt tired for 2 days and cold sensitivity for 1 day.  He has palmar erythema with no blistering or pain.  Weight is stable. - Reviewed labs today: Normal LFTs and CBC.  Electrolytes are normal. - Proceed with cycle 3 with full dose.  RTC 2 weeks for follow-up.   2.  Left urinary bladder mass and distal left ureteral mass: - Office cystoscopy by Dr. Ronne Binning showed 3 cm left lateral wall bladder mass. - He may schedule for biopsy/TURBT in August.    No orders of the defined types were placed in this encounter.     I,Katie Daubenspeck,acting as a Neurosurgeon for Doreatha Massed, MD.,have documented all relevant documentation on the behalf of Doreatha Massed, MD,as directed by  Doreatha Massed, MD while in the presence of Doreatha Massed, MD.   I, Doreatha Massed MD, have reviewed the above documentation for accuracy and completeness, and I agree with the above.   Doreatha Massed, MD   5/29/20246:48 PM  CHIEF COMPLAINT:   Diagnosis: Rectal adenocarcinoma    Cancer Staging  Rectal adenocarcinoma [facility]) Staging form: Colon and Rectum, AJCC 8th Edition - Clinical stage from 05/07/2022: Stage IIA (cT3, cN0, cM0) - Unsigned    Prior Therapy:  none  Current Therapy:  Concurrent chemoradiation with FOLFOX, to be followed by surgery    HISTORY OF PRESENT ILLNESS:   Oncology History  Rectal adenocarcinoma (HCC)  05/07/2022 Initial Diagnosis   Rectal adenocarcinoma (HCC)   06/07/2022 -  Chemotherapy   Patient is on Treatment Plan : COLORECTAL FOLFOX q14d x 4 months        INTERVAL HISTORY:   James Miles is a 77 y.o. male presenting to clinic today for follow up of Rectal adenocarcinoma. He was last seen by me on  06/21/22.  Today, he states that he is doing well overall. His appetite level is at 100%. His energy level is at 7 3%.  PAST MEDICAL HISTORY:   Past Medical History: Past Medical History:  Diagnosis Date   Back pain    COPD (chronic obstructive pulmonary disease) (HCC)    Diabetes mellitus without complication (HCC)    Hyperlipidemia     Surgical History: Past Surgical History:  Procedure Laterality Date   BIOPSY  04/26/2022   Procedure: BIOPSY;  Surgeon: Corbin Ade, MD;  [facility];  Service: Endoscopy;;   COLONOSCOPY WITH PROPOFOL N/A 04/26/2022   Procedure: COLONOSCOPY WITH PROPOFOL;  Surgeon: Corbin Ade, MD;  [facility];  Service: Endoscopy;  Laterality: N/A;  10:00am; ASA 3   hemorhoidectomy     IR IMAGING GUIDED PORT INSERTION  05/12/2022   POLYPECTOMY  04/26/2022   Procedure: POLYPECTOMY;  Surgeon: Corbin Ade, MD;  [facility];  Service: Endoscopy;;    Social History: Social History   Socioeconomic History   Marital status: Widowed    Spouse name: Not on file   Number of children: 2   Years of education: Not on file   Highest education level: Not on file  Occupational History   Not on file  Tobacco Use   Smoking status: Former    Years: 50    Types: Cigarettes    Quit date: 08/2016    Years since quitting: 5.9   Smokeless tobacco: Never  Vaping Use   Vaping Use: Never used  Substance and Sexual Activity   Alcohol use: Not on file   Drug use: Never   Sexual activity: Not Currently  Other Topics Concern   Not on file  Social History Narrative   Not on file   Social Determinants of Health   Financial Resource Strain: Not on file  Food Insecurity: No Food Insecurity (05/08/2022)   Hunger Vital Sign    Worried About Running Out of Food in the Last Year: Never true    Ran Out of Food in the Last Year: Never true  Transportation Needs: No Transportation Needs (05/08/2022)   PRAPARE - Scientist, research (physical sciences) (Medical): No    Lack of Transportation (Non-Medical): No  Physical Activity: Not on file  Stress: Not on file  Social Connections: Not on file  Intimate Partner Violence: Not At Risk (05/08/2022)   Humiliation, Afraid, Rape, and Kick questionnaire    Fear of Current or Ex-Partner: No    Emotionally Abused: No    Physically Abused: No    Sexually Abused: No    Family History: No family history on file.  Current Medications:  Current Outpatient Medications:    acidophilus (RISAQUAD) CAPS capsule, Take 1 capsule by mouth daily., Disp: , Rfl:    albuterol (VENTOLIN HFA) 108 (90 Base) MCG/ACT inhaler, Inhale 2 puffs into the lungs every 6 (six)  hours as needed for wheezing or shortness of breath., Disp: , Rfl:    allopurinol (ZYLOPRIM) 300 MG tablet, Take 300 mg by mouth daily., Disp: , Rfl:    atorvastatin (LIPITOR) 20 MG tablet, Take 20 mg by mouth daily., Disp: , Rfl:    dextrose 5 % SOLN 1,000 mL with fluorouracil 5 GM/100ML SOLN, Inject into the vein., Disp: , Rfl:    JARDIANCE 10 MG TABS tablet, Take 20 mg by mouth daily., Disp: , Rfl:    LEUCOVORIN CALCIUM IV, Inject into the vein., Disp: , Rfl:    lidocaine-prilocaine (EMLA) cream, Apply 1 Application topically as needed., Disp: 30 g, Rfl: 0   lidocaine-prilocaine (EMLA) cream, Apply to affected area once, Disp: 30 g, Rfl: 3   lisinopril (ZESTRIL) 20 MG tablet, Take 20 mg by mouth daily., Disp: , Rfl:    loperamide (IMODIUM) 2 MG capsule, Take 2 mg by mouth 3 (three) times daily as needed for diarrhea or loose stools., Disp: , Rfl:    MELATONIN PO, Take 1 tablet by mouth at bedtime as needed (sleep)., Disp: , Rfl:    OXALIPLATIN IV, Inject into the vein., Disp: , Rfl:    prochlorperazine (COMPAZINE) 10 MG tablet, Take 1 tablet (10 mg total) by mouth every 6 (six) hours as needed for nausea or vomiting., Disp: 30 tablet, Rfl: 2   prochlorperazine (COMPAZINE) 10 MG tablet, Take 1 tablet (10 mg total) by mouth every 6  (six) hours as needed for nausea or vomiting., Disp: 60 tablet, Rfl: 4 No current facility-administered medications for this visit.  Facility-Administered Medications Ordered in Other Visits:    fluorouracil (ADRUCIL) 5,000 mg in sodium chloride 0.9 % 150 mL chemo infusion, 2,400 mg/m2 (Treatment Plan Recorded), Intravenous, 1 day or 1 dose, Doreatha Massed, MD, Infusion Verify at 07/05/22 1427   sodium chloride flush (NS) 0.9 % injection 10 mL, 10 mL, Intracatheter, PRN, Doreatha Massed, MD   Allergies: No Known Allergies  REVIEW OF SYSTEMS:   Review of Systems  Constitutional:  Negative for chills, fatigue and fever.  HENT:   Negative for lump/mass, mouth sores, nosebleeds, sore throat and trouble swallowing.   Eyes:  Negative for eye problems.  Respiratory:  Positive for shortness of breath. Negative for cough.   Cardiovascular:  Negative for chest pain, leg swelling and palpitations.  Gastrointestinal:  Negative for abdominal pain, constipation, diarrhea, nausea and vomiting.  Genitourinary:  Positive for frequency. Negative for bladder incontinence, difficulty urinating, dysuria, hematuria and nocturia.   Musculoskeletal:  Negative for arthralgias, back pain, flank pain, myalgias and neck pain.  Skin:  Negative for itching and rash.  Neurological:  Negative for dizziness, headaches and numbness.  Hematological:  Does not bruise/bleed easily.  Psychiatric/Behavioral:  Positive for sleep disturbance. Negative for depression and suicidal ideas. The patient is nervous/anxious.   All other systems reviewed and are negative.    VITALS:   There were no vitals taken for this visit.  Wt Readings from Last 3 Encounters:  07/05/22 178 lb 8 oz (81 kg)  06/21/22 178 lb 6.4 oz (80.9 kg)  06/07/22 173 lb 6.4 oz (78.7 kg)    There is no height or weight on file to calculate BMI.  Performance status (ECOG): 1 - Symptomatic but completely ambulatory  PHYSICAL EXAM:   Physical  Exam Vitals and nursing note reviewed. Exam conducted with a chaperone present.  Constitutional:      Appearance: Normal appearance.  Cardiovascular:     Rate and  Rhythm: Normal rate and regular rhythm.     Pulses: Normal pulses.     Heart sounds: Normal heart sounds.  Pulmonary:     Effort: Pulmonary effort is normal.     Breath sounds: Normal breath sounds.  Abdominal:     Palpations: Abdomen is soft. There is no hepatomegaly, splenomegaly or mass.     Tenderness: There is no abdominal tenderness.  Musculoskeletal:     Right lower leg: No edema.     Left lower leg: No edema.  Lymphadenopathy:     Cervical: No cervical adenopathy.     Right cervical: No superficial, deep or posterior cervical adenopathy.    Left cervical: No superficial, deep or posterior cervical adenopathy.     Upper Body:     Right upper body: No supraclavicular or axillary adenopathy.     Left upper body: No supraclavicular or axillary adenopathy.  Neurological:     General: No focal deficit present.     Mental Status: He is alert and oriented to person, place, and time.  Psychiatric:        Mood and Affect: Mood normal.        Behavior: Behavior normal.     LABS:      Latest Ref Rng & Units 07/05/2022    9:41 AM 06/21/2022    8:04 AM 06/07/2022    8:01 AM  CBC  WBC 4.0 - 10.5 K/uL 6.7  8.4  10.9   Hemoglobin 13.0 - 17.0 g/dL 16.1  09.6  04.5   Hematocrit 39.0 - 52.0 % 45.1  47.0  48.5   Platelets 150 - 400 K/uL 156  205  244       Latest Ref Rng & Units 07/05/2022    9:41 AM 06/21/2022    8:04 AM 06/07/2022    8:01 AM  CMP  Glucose 70 - 99 mg/dL 409  811  914   BUN 8 - 23 mg/dL 13  10  18    Creatinine 0.61 - 1.24 mg/dL 7.82  9.56  2.13   Sodium 135 - 145 mmol/L 138  139  137   Potassium 3.5 - 5.1 mmol/L 3.7  3.9  3.6   Chloride 98 - 111 mmol/L 103  103  101   CO2 22 - 32 mmol/L 21  25  25    Calcium 8.9 - 10.3 mg/dL 8.8  8.8  9.0   Total Protein 6.5 - 8.1 g/dL 7.0  7.0  7.6   Total Bilirubin  0.3 - 1.2 mg/dL 0.7  0.6  1.0   Alkaline Phos 38 - 126 U/L 63  61  73   AST 15 - 41 U/L 23  17  14    ALT 0 - 44 U/L 25  20  15       Lab Results  Component Value Date   CEA1 2.6 04/26/2022   /  CEA  Date Value Ref Range Status  04/26/2022 2.6 0.0 - 4.7 ng/mL Final    Comment:    (NOTE)                             Nonsmokers          <3.9                             Smokers             <  5.6 Roche [employer] Electrochemiluminescence Immunoassay (ECLIA) Values obtained with different assay methods or kits cannot be used interchangeably.  Results cannot be interpreted as absolute evidence of the presence or absence of malignant disease. Performed At: [facility] [address] [place], [place] [id number] Jolene Schimke MD NW:[id number]    No results found for: "PSA1" No results found for: "[id number]" No results found for: "CAN125"  No results found for: "TOTALPROTELP", "ALBUMINELP", "A1GS", "A2GS", "BETS", "BETA2SER", "GAMS", "MSPIKE", "SPEI" No results found for: "TIBC", "FERRITIN", "IRONPCTSAT" No results found for: "LDH"   STUDIES:   No results found.

## 2022-07-05 ENCOUNTER — Inpatient Hospital Stay (HOSPITAL_BASED_OUTPATIENT_CLINIC_OR_DEPARTMENT_OTHER): Payer: PPO | Admitting: Hematology

## 2022-07-05 ENCOUNTER — Inpatient Hospital Stay: Payer: PPO

## 2022-07-05 VITALS — BP 124/84 | HR 72 | Temp 97.8°F | Resp 20 | Wt 178.5 lb

## 2022-07-05 VITALS — BP 120/67 | HR 70 | Temp 97.6°F | Resp 20

## 2022-07-05 DIAGNOSIS — I4892 Unspecified atrial flutter: Secondary | ICD-10-CM | POA: Diagnosis not present

## 2022-07-05 DIAGNOSIS — C20 Malignant neoplasm of rectum: Secondary | ICD-10-CM | POA: Diagnosis not present

## 2022-07-05 DIAGNOSIS — Z95828 Presence of other vascular implants and grafts: Secondary | ICD-10-CM

## 2022-07-05 DIAGNOSIS — I471 Supraventricular tachycardia, unspecified: Secondary | ICD-10-CM | POA: Diagnosis not present

## 2022-07-05 LAB — CBC WITH DIFFERENTIAL/PLATELET
Abs Immature Granulocytes: 0.02 10*3/uL (ref 0.00–0.07)
Basophils Absolute: 0 10*3/uL (ref 0.0–0.1)
Basophils Relative: 0 %
Eosinophils Absolute: 0.1 10*3/uL (ref 0.0–0.5)
Eosinophils Relative: 2 %
HCT: 45.1 % (ref 39.0–52.0)
Hemoglobin: 15.1 g/dL (ref 13.0–17.0)
Immature Granulocytes: 0 %
Lymphocytes Relative: 38 %
Lymphs Abs: 2.5 10*3/uL (ref 0.7–4.0)
MCH: 30.6 pg (ref 26.0–34.0)
MCHC: 33.5 g/dL (ref 30.0–36.0)
MCV: 91.3 fL (ref 80.0–100.0)
Monocytes Absolute: 0.7 10*3/uL (ref 0.1–1.0)
Monocytes Relative: 11 %
Neutro Abs: 3.3 10*3/uL (ref 1.7–7.7)
Neutrophils Relative %: 49 %
Platelets: 156 10*3/uL (ref 150–400)
RBC: 4.94 MIL/uL (ref 4.22–5.81)
RDW: 13.5 % (ref 11.5–15.5)
WBC: 6.7 10*3/uL (ref 4.0–10.5)
nRBC: 0 % (ref 0.0–0.2)

## 2022-07-05 LAB — COMPREHENSIVE METABOLIC PANEL
ALT: 25 U/L (ref 0–44)
AST: 23 U/L (ref 15–41)
Albumin: 4 g/dL (ref 3.5–5.0)
Alkaline Phosphatase: 63 U/L (ref 38–126)
Anion gap: 14 (ref 5–15)
BUN: 13 mg/dL (ref 8–23)
CO2: 21 mmol/L — ABNORMAL LOW (ref 22–32)
Calcium: 8.8 mg/dL — ABNORMAL LOW (ref 8.9–10.3)
Chloride: 103 mmol/L (ref 98–111)
Creatinine, Ser: 0.88 mg/dL (ref 0.61–1.24)
GFR, Estimated: >60 mL/min (ref >60)
Glucose, Bld: 122 mg/dL — ABNORMAL HIGH (ref 70–99)
Potassium: 3.7 mmol/L (ref 3.5–5.1)
Sodium: 138 mmol/L (ref 135–145)
Total Bilirubin: 0.7 mg/dL (ref 0.3–1.2)
Total Protein: 7 g/dL (ref 6.5–8.1)

## 2022-07-05 LAB — MAGNESIUM: Magnesium: 1.9 mg/dL (ref 1.7–2.4)

## 2022-07-05 MED ORDER — LEUCOVORIN CALCIUM INJECTION 350 MG
400.0000 mg/m2 | Freq: Once | INTRAVENOUS | Status: AC
  Administered 2022-07-05: 800 mg via INTRAVENOUS
  Filled 2022-07-05: qty 40

## 2022-07-05 MED ORDER — FLUOROURACIL CHEMO INJECTION 2.5 GM/50ML
400.0000 mg/m2 | Freq: Once | INTRAVENOUS | Status: AC
  Administered 2022-07-05: 800 mg via INTRAVENOUS
  Filled 2022-07-05: qty 16

## 2022-07-05 MED ORDER — SODIUM CHLORIDE 0.9 % IV SOLN
10.0000 mg | Freq: Once | INTRAVENOUS | Status: AC
  Administered 2022-07-05: 10 mg via INTRAVENOUS
  Filled 2022-07-05: qty 10

## 2022-07-05 MED ORDER — SODIUM CHLORIDE 0.9 % IV SOLN
2400.0000 mg/m2 | INTRAVENOUS | Status: DC
  Administered 2022-07-05: 5000 mg via INTRAVENOUS
  Filled 2022-07-05: qty 100

## 2022-07-05 MED ORDER — PALONOSETRON HCL INJECTION 0.25 MG/5ML
0.2500 mg | Freq: Once | INTRAVENOUS | Status: AC
  Administered 2022-07-05: 0.25 mg via INTRAVENOUS
  Filled 2022-07-05: qty 5

## 2022-07-05 MED ORDER — DEXTROSE 5 % IV SOLN: Freq: Once | INTRAVENOUS | Status: AC

## 2022-07-05 MED ORDER — OXALIPLATIN CHEMO INJECTION 100 MG/20ML
85.0000 mg/m2 | Freq: Once | INTRAVENOUS | Status: AC
  Administered 2022-07-05: 170 mg via INTRAVENOUS
  Filled 2022-07-05: qty 20

## 2022-07-05 MED ORDER — SODIUM CHLORIDE 0.9% FLUSH: 10.0000 mL | INTRAVENOUS | Status: DC | PRN

## 2022-07-05 MED ORDER — SODIUM CHLORIDE 0.9% FLUSH
10.0000 mL | Freq: Once | INTRAVENOUS | Status: AC
  Administered 2022-07-05: 10 mL via INTRAVENOUS

## 2022-07-05 NOTE — Progress Notes (Signed)
Patient has been examined by Dr. Katragadda. Vital signs and labs have been reviewed by MD - ANC, Creatinine, LFTs, hemoglobin, and platelets are within treatment parameters per M.D. - pt may proceed with treatment.  Primary RN and pharmacy notified.  

## 2022-07-05 NOTE — Patient Instructions (Signed)
[facility]  Discharge Instructions: Thank you for choosing [facility] to provide your oncology and hematology care.  If you have a lab appointment with the Cancer Center - please note that after April 8th, 2024, all labs will be drawn in the cancer center.  You do not have to check in or register with the main entrance as you have in the past but will complete your check-in in the cancer center.  Wear comfortable clothing and clothing appropriate for easy access to any Portacath or PICC line.   We strive to give you quality time with your provider. You may need to reschedule your appointment if you arrive late (15 or more minutes).  Arriving late affects you and other patients whose appointments are after yours.  Also, if you miss three or more appointments without notifying the office, you may be dismissed from the clinic at the provider's discretion.      For prescription refill requests, have your pharmacy contact our office and allow 72 hours for refills to be completed.  To help prevent nausea and vomiting after your treatment, we encourage you to take your nausea medication as directed.  BELOW ARE SYMPTOMS THAT SHOULD BE REPORTED IMMEDIATELY: *FEVER GREATER THAN 100.4 F (38 C) OR HIGHER *CHILLS OR SWEATING *NAUSEA AND VOMITING THAT IS NOT CONTROLLED WITH YOUR NAUSEA MEDICATION *UNUSUAL SHORTNESS OF BREATH *UNUSUAL BRUISING OR BLEEDING *URINARY PROBLEMS (pain or burning when urinating, or frequent urination) *BOWEL PROBLEMS (unusual diarrhea, constipation, pain near the anus) TENDERNESS IN MOUTH AND THROAT WITH OR WITHOUT PRESENCE OF ULCERS (sore throat, sores in mouth, or a toothache) UNUSUAL RASH, SWELLING OR PAIN  UNUSUAL VAGINAL DISCHARGE OR ITCHING   Items with * indicate a potential emergency and should be followed up as soon as possible or go to the Emergency Department if any problems should occur.  Please show the CHEMOTHERAPY ALERT CARD or  IMMUNOTHERAPY ALERT CARD at check-in to the Emergency Department and triage nurse.  Should you have questions after your visit or need to cancel or reschedule your appointment, please contact [facility] [phone]  and follow the prompts.  Office hours are 8:00 a.m. to 4:30 p.m. Monday - Friday. Please note that voicemails left after 4:00 p.m. may not be returned until the following business day.  We are closed weekends and major holidays. You have access to a nurse at all times for urgent questions. Please call the main number to the clinic [phone] and follow the prompts.  For any non-urgent questions, you may also contact your provider using MyChart. We now offer e-Visits for anyone 18 and older to request care online for non-urgent symptoms. For details visit [email or web address].   Also download the MyChart app! Go to the app store, search "MyChart", open the app, select [facility], and log in with your MyChart username and password.   

## 2022-07-05 NOTE — Patient Instructions (Addendum)
[facility] Discharge Instructions   You were seen and examined today by Dr. Katragadda.  He reviewed the results of your lab work which are normal/stable.   We will proceed with your treatment today.  Return as scheduled.    Thank you for choosing [facility] to provide your oncology and hematology care.  To afford each patient quality time with our provider, please arrive at least 15 minutes before your scheduled appointment time.   If you have a lab appointment with the Cancer Center please come in thru the Main Entrance and check in at the main information desk.  You need to re-schedule your appointment should you arrive 10 or more minutes late.  We strive to give you quality time with our providers, and arriving late affects you and other patients whose appointments are after yours.  Also, if you no show three or more times for appointments you may be dismissed from the clinic at the providers discretion.     Again, thank you for choosing [facility].  Our hope is that these requests will decrease the amount of time that you wait before being seen by our physicians.       _____________________________________________________________  Should you have questions after your visit to [facility], please contact our office at [phone] and follow the prompts.  Our office hours are 8:00 a.m. and 4:30 p.m. Monday - Friday.  Please note that voicemails left after 4:00 p.m. may not be returned until the following business day.  We are closed weekends and major holidays.  You do have access to a nurse 24-7, just call the main number to the clinic [phone] and do not press any options, hold on the line and a nurse will answer the phone.    For prescription refill requests, have your pharmacy contact our office and allow 72 hours.    Due to Covid, you will need to wear a mask upon entering  the hospital. If you do not have a mask, a mask will be given to you at the Main Entrance upon arrival. For doctor visits, patients may have 1 support person age 18 or older with them. For treatment visits, patients can not have anyone with them due to social distancing guidelines and our immunocompromised population.      

## 2022-07-05 NOTE — Progress Notes (Signed)
Patient tolerated chemotherapy with no complaints voiced.  Side effects with management reviewed with understanding verbalized.  Port site clean and dry with no bruising or swelling noted at site.  Good blood return noted before and after administration of chemotherapy.  Chemotherapy pump connected with no alarms noted.   Patient left in satisfactory condition with VSS and no s/s of distress noted.  

## 2022-07-07 ENCOUNTER — Other Ambulatory Visit: Payer: Self-pay

## 2022-07-07 ENCOUNTER — Inpatient Hospital Stay: Payer: PPO

## 2022-07-07 ENCOUNTER — Encounter (HOSPITAL_COMMUNITY): Payer: Self-pay | Admitting: Emergency Medicine

## 2022-07-07 ENCOUNTER — Inpatient Hospital Stay (HOSPITAL_COMMUNITY)
Admission: EM | Admit: 2022-07-07 | Discharge: 2022-07-10 | DRG: 309 | Disposition: A | Discharge status: Home / Self Care | Payer: PPO | Source: Ambulatory Visit | Attending: Internal Medicine | Admitting: Internal Medicine

## 2022-07-07 VITALS — BP 118/91 | HR 151 | Temp 96.5°F | Resp 20

## 2022-07-07 DIAGNOSIS — I471 Supraventricular tachycardia, unspecified: Secondary | ICD-10-CM | POA: Diagnosis present

## 2022-07-07 DIAGNOSIS — C20 Malignant neoplasm of rectum: Secondary | ICD-10-CM | POA: Diagnosis present

## 2022-07-07 DIAGNOSIS — C7800 Secondary malignant neoplasm of unspecified lung: Secondary | ICD-10-CM | POA: Diagnosis present

## 2022-07-07 DIAGNOSIS — I4892 Unspecified atrial flutter: Principal | ICD-10-CM | POA: Diagnosis present

## 2022-07-07 DIAGNOSIS — N329 Bladder disorder, unspecified: Secondary | ICD-10-CM | POA: Diagnosis present

## 2022-07-07 DIAGNOSIS — Z79890 Hormone replacement therapy (postmenopausal): Secondary | ICD-10-CM

## 2022-07-07 DIAGNOSIS — T461X5A Adverse effect of calcium-channel blockers, initial encounter: Secondary | ICD-10-CM | POA: Diagnosis not present

## 2022-07-07 DIAGNOSIS — Y92239 Unspecified place in hospital as the place of occurrence of the external cause: Secondary | ICD-10-CM | POA: Diagnosis not present

## 2022-07-07 DIAGNOSIS — R197 Diarrhea, unspecified: Secondary | ICD-10-CM | POA: Diagnosis present

## 2022-07-07 DIAGNOSIS — Z7901 Long term (current) use of anticoagulants: Secondary | ICD-10-CM

## 2022-07-07 DIAGNOSIS — Z823 Family history of stroke: Secondary | ICD-10-CM

## 2022-07-07 DIAGNOSIS — Z7984 Long term (current) use of oral hypoglycemic drugs: Secondary | ICD-10-CM

## 2022-07-07 DIAGNOSIS — E785 Hyperlipidemia, unspecified: Secondary | ICD-10-CM | POA: Diagnosis present

## 2022-07-07 DIAGNOSIS — E119 Type 2 diabetes mellitus without complications: Secondary | ICD-10-CM

## 2022-07-07 DIAGNOSIS — J449 Chronic obstructive pulmonary disease, unspecified: Secondary | ICD-10-CM | POA: Diagnosis present

## 2022-07-07 DIAGNOSIS — Z87891 Personal history of nicotine dependence: Secondary | ICD-10-CM

## 2022-07-07 DIAGNOSIS — Z79899 Other long term (current) drug therapy: Secondary | ICD-10-CM

## 2022-07-07 DIAGNOSIS — I4891 Unspecified atrial fibrillation: Secondary | ICD-10-CM | POA: Diagnosis present

## 2022-07-07 DIAGNOSIS — I952 Hypotension due to drugs: Secondary | ICD-10-CM | POA: Diagnosis not present

## 2022-07-07 DIAGNOSIS — C189 Malignant neoplasm of colon, unspecified: Secondary | ICD-10-CM

## 2022-07-07 LAB — COMPREHENSIVE METABOLIC PANEL
ALT: 43 U/L (ref 0–44)
AST: 30 U/L (ref 15–41)
Albumin: 4 g/dL (ref 3.5–5.0)
Alkaline Phosphatase: 72 U/L (ref 38–126)
Anion gap: 11 (ref 5–15)
BUN: 15 mg/dL (ref 8–23)
CO2: 26 mmol/L (ref 22–32)
Calcium: 8.8 mg/dL — ABNORMAL LOW (ref 8.9–10.3)
Chloride: 99 mmol/L (ref 98–111)
Creatinine, Ser: 0.87 mg/dL (ref 0.61–1.24)
GFR, Estimated: >60 mL/min (ref >60)
Glucose, Bld: 140 mg/dL — ABNORMAL HIGH (ref 70–99)
Potassium: 3.7 mmol/L (ref 3.5–5.1)
Sodium: 136 mmol/L (ref 135–145)
Total Bilirubin: 0.7 mg/dL (ref 0.3–1.2)
Total Protein: 7.3 g/dL (ref 6.5–8.1)

## 2022-07-07 LAB — GLUCOSE, CAPILLARY: Glucose-Capillary: 147 mg/dL — ABNORMAL HIGH (ref 70–99)

## 2022-07-07 LAB — CBC
HCT: 48.6 % (ref 39.0–52.0)
Hemoglobin: 16.4 g/dL (ref 13.0–17.0)
MCH: 30.8 pg (ref 26.0–34.0)
MCHC: 33.7 g/dL (ref 30.0–36.0)
MCV: 91.2 fL (ref 80.0–100.0)
Platelets: 169 10*3/uL (ref 150–400)
RBC: 5.33 MIL/uL (ref 4.22–5.81)
RDW: 13.9 % (ref 11.5–15.5)
WBC: 6 10*3/uL (ref 4.0–10.5)
nRBC: 0 % (ref 0.0–0.2)

## 2022-07-07 LAB — TROPONIN I (HIGH SENSITIVITY): Troponin I (High Sensitivity): 11 ng/L (ref <18)

## 2022-07-07 LAB — MRSA NEXT GEN BY PCR, NASAL: MRSA by PCR Next Gen: NOT DETECTED

## 2022-07-07 MED ORDER — METOPROLOL TARTRATE 25 MG PO TABS: 25.0000 mg | ORAL_TABLET | Freq: Two times a day (BID) | ORAL | Status: DC

## 2022-07-07 MED ORDER — ALBUTEROL SULFATE (2.5 MG/3ML) 0.083% IN NEBU
2.5000 mg | INHALATION_SOLUTION | Freq: Four times a day (QID) | RESPIRATORY_TRACT | Status: DC | PRN
  Administered 2022-07-08: 2.5 mg via RESPIRATORY_TRACT
  Filled 2022-07-07: qty 3

## 2022-07-07 MED ORDER — INSULIN ASPART 100 UNIT/ML IJ SOLN: 0.0000 [IU] | Freq: Every day | INTRAMUSCULAR | Status: DC

## 2022-07-07 MED ORDER — SODIUM CHLORIDE 0.9 % IV SOLN: INTRAVENOUS | Status: DC

## 2022-07-07 MED ORDER — PROCHLORPERAZINE MALEATE 5 MG PO TABS: 10.0000 mg | ORAL_TABLET | Freq: Four times a day (QID) | ORAL | Status: DC | PRN

## 2022-07-07 MED ORDER — EMPAGLIFLOZIN 10 MG PO TABS
20.0000 mg | ORAL_TABLET | Freq: Every day | ORAL | Status: DC
  Administered 2022-07-08 – 2022-07-10 (×3): 20 mg via ORAL
  Filled 2022-07-07 (×5): qty 2

## 2022-07-07 MED ORDER — ONDANSETRON HCL 4 MG/2ML IJ SOLN: 4.0000 mg | Freq: Four times a day (QID) | INTRAMUSCULAR | Status: DC | PRN

## 2022-07-07 MED ORDER — CHLORHEXIDINE GLUCONATE CLOTH 2 % EX PADS
6.0000 | MEDICATED_PAD | Freq: Every day | CUTANEOUS | Status: DC
  Administered 2022-07-07: 6 via TOPICAL

## 2022-07-07 MED ORDER — ADENOSINE 6 MG/2ML IV SOLN
6.0000 mg | Freq: Once | INTRAVENOUS | Status: AC
  Administered 2022-07-07: 6 mg via INTRAVENOUS
  Filled 2022-07-07: qty 2

## 2022-07-07 MED ORDER — SODIUM CHLORIDE 0.9% FLUSH
10.0000 mL | INTRAVENOUS | Status: DC | PRN
  Administered 2022-07-07: 10 mL

## 2022-07-07 MED ORDER — ADENOSINE 6 MG/2ML IV SOLN
6.0000 mg | Freq: Once | INTRAVENOUS | Status: AC
  Administered 2022-07-07: 6 mg via INTRAVENOUS

## 2022-07-07 MED ORDER — LISINOPRIL 10 MG PO TABS: 20.0000 mg | ORAL_TABLET | Freq: Every day | ORAL | Status: DC

## 2022-07-07 MED ORDER — MELATONIN 3 MG PO TABS
3.0000 mg | ORAL_TABLET | Freq: Every evening | ORAL | Status: DC | PRN
  Administered 2022-07-08 – 2022-07-09 (×2): 3 mg via ORAL
  Filled 2022-07-07 (×2): qty 1

## 2022-07-07 MED ORDER — ATORVASTATIN CALCIUM 20 MG PO TABS
20.0000 mg | ORAL_TABLET | Freq: Every day | ORAL | Status: DC
  Administered 2022-07-08 – 2022-07-10 (×3): 20 mg via ORAL
  Filled 2022-07-07 (×3): qty 1

## 2022-07-07 MED ORDER — ADENOSINE 6 MG/2ML IV SOLN
INTRAVENOUS | Status: AC
  Filled 2022-07-07: qty 2

## 2022-07-07 MED ORDER — HEPARIN SOD (PORK) LOCK FLUSH 100 UNIT/ML IV SOLN
500.0000 [IU] | Freq: Once | INTRAVENOUS | Status: AC | PRN
  Administered 2022-07-07: 500 [IU]

## 2022-07-07 MED ORDER — ACETAMINOPHEN 325 MG PO TABS: 650.0000 mg | ORAL_TABLET | ORAL | Status: DC | PRN

## 2022-07-07 MED ORDER — ALBUTEROL SULFATE HFA 108 (90 BASE) MCG/ACT IN AERS: 2.0000 | INHALATION_SPRAY | Freq: Four times a day (QID) | RESPIRATORY_TRACT | Status: DC | PRN

## 2022-07-07 MED ORDER — DILTIAZEM HCL-DEXTROSE 125-5 MG/125ML-% IV SOLN (PREMIX)
5.0000 mg/h | INTRAVENOUS | Status: DC
  Administered 2022-07-07: 5 mg/h via INTRAVENOUS
  Administered 2022-07-08: 15 mg/h via INTRAVENOUS
  Administered 2022-07-08: 5 mg/h via INTRAVENOUS
  Filled 2022-07-07 (×3): qty 125

## 2022-07-07 MED ORDER — ALLOPURINOL 100 MG PO TABS
300.0000 mg | ORAL_TABLET | Freq: Every day | ORAL | Status: DC
  Administered 2022-07-08 – 2022-07-10 (×3): 300 mg via ORAL
  Filled 2022-07-07 (×3): qty 1

## 2022-07-07 MED ORDER — INSULIN ASPART 100 UNIT/ML IJ SOLN
0.0000 [IU] | Freq: Three times a day (TID) | INTRAMUSCULAR | Status: DC
  Administered 2022-07-08: 5 [IU] via SUBCUTANEOUS
  Administered 2022-07-08: 2 [IU] via SUBCUTANEOUS
  Administered 2022-07-09: 3 [IU] via SUBCUTANEOUS
  Administered 2022-07-10: 5 [IU] via SUBCUTANEOUS

## 2022-07-07 MED ORDER — ADENOSINE 6 MG/2ML IV SOLN: 12.0000 mg | Freq: Once | INTRAVENOUS | Status: DC

## 2022-07-07 MED ORDER — LOPERAMIDE HCL 2 MG PO CAPS
2.0000 mg | ORAL_CAPSULE | Freq: Three times a day (TID) | ORAL | Status: DC | PRN
  Administered 2022-07-08 – 2022-07-09 (×2): 2 mg via ORAL
  Filled 2022-07-07 (×2): qty 1

## 2022-07-07 MED ORDER — APIXABAN 5 MG PO TABS
5.0000 mg | ORAL_TABLET | Freq: Two times a day (BID) | ORAL | Status: DC
  Administered 2022-07-07 – 2022-07-10 (×6): 5 mg via ORAL
  Filled 2022-07-07 (×6): qty 1

## 2022-07-07 MED ORDER — ORAL CARE MOUTH RINSE: 15.0000 mL | OROMUCOSAL | Status: DC | PRN

## 2022-07-07 MED ORDER — RISAQUAD PO CAPS
1.0000 | ORAL_CAPSULE | Freq: Every day | ORAL | Status: DC
  Administered 2022-07-08 – 2022-07-10 (×3): 1 via ORAL
  Filled 2022-07-07 (×3): qty 1

## 2022-07-07 MED ORDER — METOPROLOL TARTRATE 50 MG PO TABS
50.0000 mg | ORAL_TABLET | Freq: Two times a day (BID) | ORAL | Status: DC
  Administered 2022-07-07 – 2022-07-08 (×3): 50 mg via ORAL
  Filled 2022-07-07 (×3): qty 1

## 2022-07-07 NOTE — Patient Instructions (Signed)
[facility]  Discharge Instructions: Thank you for choosing [facility] to provide your oncology and hematology care.  If you have a lab appointment with the Cancer Center - please note that after April 8th, 2024, all labs will be drawn in the cancer center.  You do not have to check in or register with the main entrance as you have in the past but will complete your check-in in the cancer center.  Wear comfortable clothing and clothing appropriate for easy access to any Portacath or PICC line.   We strive to give you quality time with your provider. You may need to reschedule your appointment if you arrive late (15 or more minutes).  Arriving late affects you and other patients whose appointments are after yours.  Also, if you miss three or more appointments without notifying the office, you may be dismissed from the clinic at the provider's discretion.      For prescription refill requests, have your pharmacy contact our office and allow 72 hours for refills to be completed.  To help prevent nausea and vomiting after your treatment, we encourage you to take your nausea medication as directed.  BELOW ARE SYMPTOMS THAT SHOULD BE REPORTED IMMEDIATELY: *FEVER GREATER THAN 100.4 F (38 C) OR HIGHER *CHILLS OR SWEATING *NAUSEA AND VOMITING THAT IS NOT CONTROLLED WITH YOUR NAUSEA MEDICATION *UNUSUAL SHORTNESS OF BREATH *UNUSUAL BRUISING OR BLEEDING *URINARY PROBLEMS (pain or burning when urinating, or frequent urination) *BOWEL PROBLEMS (unusual diarrhea, constipation, pain near the anus) TENDERNESS IN MOUTH AND THROAT WITH OR WITHOUT PRESENCE OF ULCERS (sore throat, sores in mouth, or a toothache) UNUSUAL RASH, SWELLING OR PAIN  UNUSUAL VAGINAL DISCHARGE OR ITCHING   Items with * indicate a potential emergency and should be followed up as soon as possible or go to the Emergency Department if any problems should occur.  Please show the CHEMOTHERAPY ALERT CARD or  IMMUNOTHERAPY ALERT CARD at check-in to the Emergency Department and triage nurse.  Should you have questions after your visit or need to cancel or reschedule your appointment, please contact [facility] [phone]  and follow the prompts.  Office hours are 8:00 a.m. to 4:30 p.m. Monday - Friday. Please note that voicemails left after 4:00 p.m. may not be returned until the following business day.  We are closed weekends and major holidays. You have access to a nurse at all times for urgent questions. Please call the main number to the clinic [phone] and follow the prompts.  For any non-urgent questions, you may also contact your provider using MyChart. We now offer e-Visits for anyone 18 and older to request care online for non-urgent symptoms. For details visit [email or web address].   Also download the MyChart app! Go to the app store, search "MyChart", open the app, select [facility], and log in with your MyChart username and password.   

## 2022-07-07 NOTE — ED Notes (Addendum)
Timeout done ant 1904. 2 MD's 2 RN/s present. 6mg  of adenosine given. PT HR did not return to base line. 12 mg ordered. 2 RN's and MD present. Time out done at 1909. 12mg  given at 1911. HR still maintained in 150's. MD ordered to continue cardizem. 7.5 at this time. PT is alert and oriented. Son is at bedside

## 2022-07-07 NOTE — ED Provider Notes (Signed)
[place] DEPARTMENT AT [facility] Provider Note   CSN: [id number] Arrival date & time: 07/07/22  1538     History  Chief Complaint  Patient presents with   Tachycardia    James Miles is a 77 y.o. male.  Patient sent down from the [facility].  Patient followed by them for rectal adenocarcinoma.  CT recently showed that there is metastatic disease in the lungs as well.  Patient just was returning a device today he was not receiving any treatments and doing his vital signs and noticed that he was very tachycardic patient was unaware of this.  Heart rate was around 150 and patient was referred down here.  Patient is stage II low rectal adenocarcinoma he is receiving flow Vaux last treatment was on May 15.  Also patient has a bladder mass.  Chart review, shows that CT scan raise some concerns about lung nodules.  PET scan done April 4 showed no evidence of metastatic disease based on that.  Patient states that he felt fine all day.  Does drink a lot of green tea.  Never been told that his heart rate was fast before.       Home Medications Prior to Admission medications   Medication Sig Start Date End Date Taking? Authorizing Provider  acidophilus (RISAQUAD) CAPS capsule Take 1 capsule by mouth daily.    [provider]  albuterol (VENTOLIN HFA) 108 (90 Base) MCG/ACT inhaler Inhale 2 puffs into the lungs every 6 (six) hours as needed for wheezing or shortness of breath. 03/29/19   [provider]  allopurinol (ZYLOPRIM) 300 MG tablet Take 300 mg by mouth daily. 03/29/19   [provider]  atorvastatin (LIPITOR) 20 MG tablet Take 20 mg by mouth daily. 03/29/19   [provider]  dextrose 5 % SOLN 1,000 mL with fluorouracil 5 GM/100ML SOLN Inject into the vein.    [provider]  JARDIANCE 10 MG TABS tablet Take 20 mg by mouth daily.    [provider]  LEUCOVORIN CALCIUM IV Inject into the vein.     [provider]  lidocaine-prilocaine (EMLA) cream Apply 1 Application topically as needed. 05/17/22   Doreatha Massed, MD  lidocaine-prilocaine (EMLA) cream Apply to affected area once 06/07/22   Doreatha Massed, MD  lisinopril (ZESTRIL) 20 MG tablet Take 20 mg by mouth daily.    [provider]  loperamide (IMODIUM) 2 MG capsule Take 2 mg by mouth 3 (three) times daily as needed for diarrhea or loose stools.    [provider]  MELATONIN PO Take 1 tablet by mouth at bedtime as needed (sleep).    [provider]  OXALIPLATIN IV Inject into the vein.    [provider]  prochlorperazine (COMPAZINE) 10 MG tablet Take 1 tablet (10 mg total) by mouth every 6 (six) hours as needed for nausea or vomiting. 05/17/22   Doreatha Massed, MD  prochlorperazine (COMPAZINE) 10 MG tablet Take 1 tablet (10 mg total) by mouth every 6 (six) hours as needed for nausea or vomiting. 06/07/22   Doreatha Massed, MD      Allergies    Patient has no known allergies.    Review of Systems   Review of Systems  Constitutional:  Negative for chills and fever.  HENT:  Negative for ear pain and sore throat.   Eyes:  Negative for pain and visual disturbance.  Respiratory:  Negative for cough and shortness of breath.  Cardiovascular:  Positive for palpitations. Negative for chest pain.  Gastrointestinal:  Negative for abdominal pain and vomiting.  Genitourinary:  Negative for dysuria and hematuria.  Musculoskeletal:  Negative for arthralgias and back pain.  Skin:  Negative for color change and rash.  Neurological:  Negative for seizures and syncope.  All other systems reviewed and are negative.   Physical Exam Updated Vital Signs BP (!) 143/85   Pulse 66   Temp (!) 97 F (36.1 C) (Oral)   Resp 18   Ht 1.803 m (5\' 11" )   Wt 81 kg   SpO2 97%   BMI 24.90 kg/m  Physical Exam Vitals and nursing note reviewed.  Constitutional:      General: He is not in  acute distress.    Appearance: Normal appearance. He is well-developed.  HENT:     Head: Normocephalic and atraumatic.  Eyes:     Extraocular Movements: Extraocular movements intact.     Conjunctiva/sclera: Conjunctivae normal.     Pupils: Pupils are equal, round, and reactive to light.  Cardiovascular:     Rate and Rhythm: Tachycardia present. Rhythm irregular.     Heart sounds: No murmur heard. Pulmonary:     Effort: Pulmonary effort is normal. No respiratory distress.     Breath sounds: Normal breath sounds.  Abdominal:     Palpations: Abdomen is soft.     Tenderness: There is no abdominal tenderness.  Musculoskeletal:        General: No swelling.     Cervical back: Normal range of motion and neck supple.  Skin:    General: Skin is warm and dry.     Capillary Refill: Capillary refill takes less than 2 seconds.  Neurological:     General: No focal deficit present.     Mental Status: He is alert and oriented to person, place, and time.  Psychiatric:        Mood and Affect: Mood normal.     ED Results / Procedures / Treatments   Labs (all labs ordered are listed, but only abnormal results are displayed) Labs Reviewed  COMPREHENSIVE METABOLIC PANEL - Abnormal; Notable for the following components:      Result Value   Glucose, Bld 140 (*)    Calcium 8.8 (*)    All other components within normal limits  CBC  TROPONIN I (HIGH SENSITIVITY)    EKG EKG Interpretation  Date/Time:  Friday Jul 07 2022 18:04:43 EDT Ventricular Rate:  63 PR Interval:  146 QRS Duration: 100 QT Interval:  375 QTC Calculation: 384 R Axis:   86 Text Interpretation: Sinus rhythm Multiple premature complexes, vent & supraven Borderline right axis deviation Low voltage, precordial leads Confirmed by Vanetta Mulders [phone]) on 07/07/2022 6:09:26 PM  Radiology No results found.  Procedures Procedures    Medications Ordered in ED Medications  0.9 %  sodium chloride infusion ( Intravenous  New Bag/Given 07/07/22 1734)  diltiazem (CARDIZEM) 125 mg in dextrose 5% 125 mL (1 mg/mL) infusion (has no administration in time range)  adenosine (ADENOCARD) 6 MG/2ML injection 6 mg (6 mg Intravenous Given 07/07/22 1828)    ED Course/ Medical Decision Making/ A&P                             Medical Decision Making Amount and/or Complexity of Data Reviewed Labs: ordered.  Risk Prescription drug management. Decision regarding hospitalization.   CRITICAL CARE Performed by: Vanetta Mulders Total  critical care time: 40 minutes Critical care time was exclusive of separately billable procedures and treating other patients. Critical care was necessary to treat or prevent imminent or life-threatening deterioration. Critical care was time spent personally by me on the following activities: development of treatment plan with patient and/or surrogate as well as nursing, discussions with consultants, evaluation of patient's response to treatment, examination of patient, obtaining history from patient or surrogate, ordering and performing treatments and interventions, ordering and review of laboratory studies, ordering and review of radiographic studies, pulse oximetry and re-evaluation of patient's condition.  Patient originally presented to the heart rate around 150.  We were repairing to give him adenosine hooking up to monitors at all and he spontaneously converted.  Stayed there for more than an hour and then went into SVT again.  We tried vagal maneuvers again without any help gave him 6 mg of adenosine and he converted.  Will hang diltiazem drip just in case he goes fast again but will not start it.  Will contact hospitalist for admission.    Final Clinical Impression(s) / ED Diagnoses Final diagnoses:  SVT (supraventricular tachycardia)    Rx / DC Orders ED Discharge Orders     None         Vanetta Mulders, MD 07/07/22 [phone]

## 2022-07-07 NOTE — Plan of Care (Signed)

## 2022-07-07 NOTE — ED Notes (Signed)
EDP at bedside  

## 2022-07-07 NOTE — H&P (Signed)
History and Physical    Patient: James Miles:[id number] DOB: 31-Jul-1945 DOA: 07/07/2022 DOS: the patient was seen and examined on 07/07/2022 PCP: [name]  Patient coming from: Home  Chief Complaint:  Chief Complaint  Patient presents with   Tachycardia   HPI: James Miles is a 77 y.o. male with medical history significant of adenocarcinoma of the rectum, COPD, diabetes, hyperlipidemia.  Patient was at the cancer center getting something disconnected and was found to have a heart rate in the 150s.  Patient was asymptomatic.  It appears that he was rechecked after a while and his heart rate remained in the 150s, so the patient was sent to the emergency department for evaluation.  In the emergency department, the patient was found to be in a rapid narrow tachycardia.  He spontaneously converted initially, but then went back into the tachyarrhythmia.  He was given adenosine 6 mg, which converted him to sinus rhythm again, however he went back into tachyarrhythmia.  On subsequent doses of adenosine, it appeared that the patient was in atrial flutter.  This was not captured on telemetry.  Cardizem was started and the patient was converted into sinus rhythm.  Patient is unaware that he goes in and out of tachycardia.  He is not currently on beta-blocker.  Review of Systems: As mentioned in the history of present illness. All other systems reviewed and are negative. Past Medical History:  Diagnosis Date   Back pain    COPD (chronic obstructive pulmonary disease) (HCC)    Diabetes mellitus without complication (HCC)    Hyperlipidemia    Past Surgical History:  Procedure Laterality Date   BIOPSY  04/26/2022   Procedure: BIOPSY;  Surgeon: Corbin Ade, MD;  Location: [facility];  Service: Endoscopy;;   COLONOSCOPY WITH PROPOFOL N/A 04/26/2022   Procedure: COLONOSCOPY WITH PROPOFOL;  Surgeon: Corbin Ade, MD;  Location: [facility];  Service: Endoscopy;   Laterality: N/A;  10:00am; ASA 3   hemorhoidectomy     IR IMAGING GUIDED PORT INSERTION  05/12/2022   POLYPECTOMY  04/26/2022   Procedure: POLYPECTOMY;  Surgeon: Corbin Ade, MD;  Location: [facility];  Service: Endoscopy;;   Social History:  reports that he quit smoking about 5 years ago. His smoking use included cigarettes. He has never used smokeless tobacco. He reports that he does not use drugs. No history on file for alcohol use.  No Known Allergies  History reviewed. No pertinent family history.  Prior to Admission medications   Medication Sig Start Date End Date Taking? Authorizing Provider  acidophilus (RISAQUAD) CAPS capsule Take 1 capsule by mouth daily.    [provider]  albuterol (VENTOLIN HFA) 108 (90 Base) MCG/ACT inhaler Inhale 2 puffs into the lungs every 6 (six) hours as needed for wheezing or shortness of breath. 03/29/19   [provider]  allopurinol (ZYLOPRIM) 300 MG tablet Take 300 mg by mouth daily. 03/29/19   [provider]  atorvastatin (LIPITOR) 20 MG tablet Take 20 mg by mouth daily. 03/29/19   [provider]  dextrose 5 % SOLN 1,000 mL with fluorouracil 5 GM/100ML SOLN Inject into the vein.    [provider]  JARDIANCE 10 MG TABS tablet Take 20 mg by mouth daily.    [provider]  LEUCOVORIN CALCIUM IV Inject into the vein.    [provider]  lidocaine-prilocaine (EMLA) cream Apply 1 Application topically as needed. 05/17/22  Doreatha Massed, MD  lidocaine-prilocaine (EMLA) cream Apply to affected area once 06/07/22   Doreatha Massed, MD  lisinopril (ZESTRIL) 20 MG tablet Take 20 mg by mouth daily.    [provider]  loperamide (IMODIUM) 2 MG capsule Take 2 mg by mouth 3 (three) times daily as needed for diarrhea or loose stools.    [provider]  MELATONIN PO Take 1 tablet by mouth at bedtime as needed (sleep).    [provider]  OXALIPLATIN IV Inject  into the vein.    [provider]  prochlorperazine (COMPAZINE) 10 MG tablet Take 1 tablet (10 mg total) by mouth every 6 (six) hours as needed for nausea or vomiting. 05/17/22   Doreatha Massed, MD  prochlorperazine (COMPAZINE) 10 MG tablet Take 1 tablet (10 mg total) by mouth every 6 (six) hours as needed for nausea or vomiting. 06/07/22   Doreatha Massed, MD    Physical Exam: Vitals:   07/07/22 1730 07/07/22 1745 07/07/22 1800 07/07/22 1829  BP: 115/86  112/83 (!) 143/85  Pulse: 66 75 66   Resp: 18 18 17 18   Temp:      TempSrc:      SpO2: 98% 99% 100% 97%  Weight:      Height:       General: Elderly male. Awake and alert and oriented x3. No acute cardiopulmonary distress.  HEENT: Normocephalic atraumatic.  Right and left ears normal in appearance.  Pupils equal, round, reactive to light. Extraocular muscles are intact. Sclerae anicteric and noninjected.  Moist mucosal membranes. No mucosal lesions.  Neck: Neck supple without lymphadenopathy. No carotid bruits. No masses palpated.  Cardiovascular: Regular rate with normal S1-S2 sounds. No murmurs, rubs, gallops auscultated. No JVD.  Respiratory: Good respiratory effort with no wheezes, rales, rhonchi. Lungs clear to auscultation bilaterally.  No accessory muscle use. Abdomen: Soft, nontender, nondistended. Active bowel sounds. No masses or hepatosplenomegaly  Skin: No rashes, lesions, or ulcerations.  Dry, warm to touch. 2+ dorsalis pedis and radial pulses. Musculoskeletal: No calf or leg pain. All major joints not erythematous nontender.  No upper or lower joint deformation.  Good ROM.  No contractures  Psychiatric: Intact judgment and insight. Pleasant and cooperative. Neurologic: No focal neurological deficits. Strength is 5/5 and symmetric in upper and lower extremities.  Cranial nerves II through XII are grossly intact.  Data Reviewed: Results for orders placed or performed during the hospital encounter of 07/07/22  (from the past 24 hour(s))  Troponin I (High Sensitivity)     Status: None   Collection Time: 07/07/22  4:10 PM  Result Value Ref Range   Troponin I (High Sensitivity) 11 <18 ng/L  CBC     Status: None   Collection Time: 07/07/22  4:10 PM  Result Value Ref Range   WBC 6.0 4.0 - 10.5 K/uL   RBC 5.33 4.22 - 5.81 MIL/uL   Hemoglobin 16.4 13.0 - 17.0 g/dL   HCT 16.1 09.6 - 04.5 %   MCV 91.2 80.0 - 100.0 fL   MCH 30.8 26.0 - 34.0 pg   MCHC 33.7 30.0 - 36.0 g/dL   RDW 40.9 81.1 - 91.4 %   Platelets 169 150 - 400 K/uL   nRBC 0.0 0.0 - 0.2 %  Comprehensive metabolic panel     Status: Abnormal   Collection Time: 07/07/22  4:10 PM  Result Value Ref Range   Sodium 136 135 - 145 mmol/L   Potassium 3.7 3.5 - 5.1 mmol/L  Chloride 99 98 - 111 mmol/L   CO2 26 22 - 32 mmol/L   Glucose, Bld 140 (H) 70 - 99 mg/dL   BUN 15 8 - 23 mg/dL   Creatinine, Ser 8.29 0.61 - 1.24 mg/dL   Calcium 8.8 (L) 8.9 - 10.3 mg/dL   Total Protein 7.3 6.5 - 8.1 g/dL   Albumin 4.0 3.5 - 5.0 g/dL   AST 30 15 - 41 U/L   ALT 43 0 - 44 U/L   Alkaline Phosphatase 72 38 - 126 U/L   Total Bilirubin 0.7 0.3 - 1.2 mg/dL   GFR, Estimated >56 >21 mL/min   Anion gap 11 5 - 15    No results found.   Assessment and Plan: No notes have been filed under this hospital service. Service: Hospitalist  Principal Problem:   Atrial flutter with rapid ventricular response (HCC) Active Problems:   Chronic obstructive pulmonary disease (HCC)   Rectal adenocarcinoma (HCC)  Atrial flutter with rapid ventricular response On Cardizem drip Will titrate Start Eliquis Check TSH Echocardiogram in the morning COPD Stable and compensated Rectal adenocarcinoma   Advance Care Planning:   Code Status: Full Code   Consults: None  Family Communication: Son present during interview and exam  Severity of Illness: The appropriate patient status for this patient is INPATIENT. Inpatient status is judged to be reasonable and necessary  in order to provide the required intensity of service to ensure the patient's safety. The patient's presenting symptoms, physical exam findings, and initial radiographic and laboratory data in the context of their chronic comorbidities is felt to place them at high risk for further clinical deterioration. Furthermore, it is not anticipated that the patient will be medically stable for discharge from the hospital within 2 midnights of admission.   * I certify that at the point of admission it is my clinical judgment that the patient will require inpatient hospital care spanning beyond 2 midnights from the point of admission due to high intensity of service, high risk for further deterioration and high frequency of surveillance required.*  Author: Levie Heritage, DO 07/07/2022 7:48 PM  For on call review [email or web address].

## 2022-07-07 NOTE — Progress Notes (Signed)
Patient here to have 5FU disconnected. Patient's HR 158 upon arrival and short of breath. Per patient has COPD and used albuterol inhaler while walking in hall. Denies any chest pain, nausea, vomiting, or dizziness. Rojelio Brenner PA aware, assessed patient and will recheck after 15 minutes after pump removed. Pump disconnected without complications. Port flushed without difficulty.  Good blood return noted with no bruising or swelling noted at site.  Band aid applied.  Patient's HR still 140s-150s. Patient recommended to be seen in ED. Patient agrees. Meridee Score RN given report.

## 2022-07-07 NOTE — ED Notes (Signed)
Time out for cardioversion at 1822.  MD, 2 RN's and RT at bedside. Adenosine pushed at 1828. PT converted to NSR

## 2022-07-07 NOTE — ED Triage Notes (Addendum)
Pt brought from cancer center. He went to have his chemo takedown performed and they sent him here because his HR was elevated well above baseline. No new SOB, palpitations, headache, blurry vision. Pt denies pain. HR 153 in triage, pt asymptomatic.

## 2022-07-08 ENCOUNTER — Observation Stay (HOSPITAL_COMMUNITY): Payer: PPO

## 2022-07-08 DIAGNOSIS — Z7984 Long term (current) use of oral hypoglycemic drugs: Secondary | ICD-10-CM | POA: Diagnosis not present

## 2022-07-08 DIAGNOSIS — J449 Chronic obstructive pulmonary disease, unspecified: Secondary | ICD-10-CM | POA: Diagnosis present

## 2022-07-08 DIAGNOSIS — Z7901 Long term (current) use of anticoagulants: Secondary | ICD-10-CM | POA: Diagnosis not present

## 2022-07-08 DIAGNOSIS — I4891 Unspecified atrial fibrillation: Secondary | ICD-10-CM

## 2022-07-08 DIAGNOSIS — E785 Hyperlipidemia, unspecified: Secondary | ICD-10-CM | POA: Diagnosis present

## 2022-07-08 DIAGNOSIS — N329 Bladder disorder, unspecified: Secondary | ICD-10-CM | POA: Diagnosis present

## 2022-07-08 DIAGNOSIS — Z823 Family history of stroke: Secondary | ICD-10-CM | POA: Diagnosis not present

## 2022-07-08 DIAGNOSIS — R197 Diarrhea, unspecified: Secondary | ICD-10-CM | POA: Diagnosis present

## 2022-07-08 DIAGNOSIS — Z79899 Other long term (current) drug therapy: Secondary | ICD-10-CM | POA: Diagnosis not present

## 2022-07-08 DIAGNOSIS — I952 Hypotension due to drugs: Secondary | ICD-10-CM | POA: Diagnosis not present

## 2022-07-08 DIAGNOSIS — T461X5A Adverse effect of calcium-channel blockers, initial encounter: Secondary | ICD-10-CM | POA: Diagnosis not present

## 2022-07-08 DIAGNOSIS — Y92239 Unspecified place in hospital as the place of occurrence of the external cause: Secondary | ICD-10-CM | POA: Diagnosis not present

## 2022-07-08 DIAGNOSIS — I471 Supraventricular tachycardia, unspecified: Principal | ICD-10-CM | POA: Diagnosis present

## 2022-07-08 DIAGNOSIS — Z87891 Personal history of nicotine dependence: Secondary | ICD-10-CM | POA: Diagnosis not present

## 2022-07-08 DIAGNOSIS — C20 Malignant neoplasm of rectum: Secondary | ICD-10-CM | POA: Diagnosis present

## 2022-07-08 DIAGNOSIS — E119 Type 2 diabetes mellitus without complications: Secondary | ICD-10-CM | POA: Diagnosis present

## 2022-07-08 DIAGNOSIS — Z79890 Hormone replacement therapy (postmenopausal): Secondary | ICD-10-CM | POA: Diagnosis not present

## 2022-07-08 DIAGNOSIS — I4892 Unspecified atrial flutter: Secondary | ICD-10-CM | POA: Diagnosis present

## 2022-07-08 DIAGNOSIS — C7800 Secondary malignant neoplasm of unspecified lung: Secondary | ICD-10-CM | POA: Diagnosis present

## 2022-07-08 LAB — LIPID PANEL
Cholesterol: 165 mg/dL (ref 0–200)
HDL: 40 mg/dL — ABNORMAL LOW (ref >40)
LDL Cholesterol: 62 mg/dL (ref 0–99)
Total CHOL/HDL Ratio: 4.1 RATIO
Triglycerides: 313 mg/dL — ABNORMAL HIGH (ref <150)
VLDL: 63 mg/dL — ABNORMAL HIGH (ref 0–40)

## 2022-07-08 LAB — ECHOCARDIOGRAM COMPLETE
Height: 71 in
S' Lateral: 4.2 cm
Weight: 2892.44 oz

## 2022-07-08 LAB — BASIC METABOLIC PANEL
Anion gap: 8 (ref 5–15)
BUN: 16 mg/dL (ref 8–23)
CO2: 23 mmol/L (ref 22–32)
Calcium: 8.1 mg/dL — ABNORMAL LOW (ref 8.9–10.3)
Chloride: 105 mmol/L (ref 98–111)
Creatinine, Ser: 0.8 mg/dL (ref 0.61–1.24)
GFR, Estimated: >60 mL/min (ref >60)
Glucose, Bld: 133 mg/dL — ABNORMAL HIGH (ref 70–99)
Potassium: 4.1 mmol/L (ref 3.5–5.1)
Sodium: 136 mmol/L (ref 135–145)

## 2022-07-08 LAB — GLUCOSE, CAPILLARY
Glucose-Capillary: 124 mg/dL — ABNORMAL HIGH (ref 70–99)
Glucose-Capillary: 249 mg/dL — ABNORMAL HIGH (ref 70–99)
Glucose-Capillary: 102 mg/dL — ABNORMAL HIGH (ref 70–99)
Glucose-Capillary: 101 mg/dL — ABNORMAL HIGH (ref 70–99)

## 2022-07-08 LAB — TSH: TSH: 0.697 u[IU]/mL (ref 0.350–4.500)

## 2022-07-08 MED ORDER — CHLORHEXIDINE GLUCONATE CLOTH 2 % EX PADS
6.0000 | MEDICATED_PAD | Freq: Every day | CUTANEOUS | Status: DC
  Administered 2022-07-08 – 2022-07-10 (×4): 6 via TOPICAL

## 2022-07-08 MED ORDER — PERFLUTREN LIPID MICROSPHERE
1.0000 mL | INTRAVENOUS | Status: AC | PRN
  Administered 2022-07-08: 5 mL via INTRAVENOUS

## 2022-07-08 MED ORDER — SODIUM CHLORIDE 0.9 % IV BOLUS
500.0000 mL | Freq: Once | INTRAVENOUS | Status: AC
  Administered 2022-07-08: 500 mL via INTRAVENOUS

## 2022-07-08 NOTE — Progress Notes (Signed)
PROGRESS NOTE    James Miles  HQI:[id number] DOB: 08/31/1945 DOA: 07/07/2022 PCP: [name]   Brief Narrative:    James Miles is a 77 y.o. male with medical history significant of adenocarcinoma of the rectum, COPD, diabetes, hyperlipidemia.  Patient was at the cancer center getting something disconnected and was found to have a heart rate in the 150s.  He was initially thought to have SVT and was given adenosine in the ED, but he quickly went back into tachyarrhythmia which was noted to be atrial flutter.  He is currently on IV Cardizem drip.  Assessment & Plan:   Principal Problem:   Atrial flutter with rapid ventricular response (HCC) Active Problems:   Chronic obstructive pulmonary disease (HCC)   Rectal adenocarcinoma (HCC)   Diabetes mellitus without complication (HCC)  Assessment and Plan:   Atrial flutter with rapid ventricular response On Cardizem drip Will titrate Start Eliquis TSH 0.697 Echocardiogram pending Plan for cardiology evaluation by Monday with likely need for TEE/DCCV COPD Stable and compensated Rectal adenocarcinoma Currently on chemotherapy   DVT prophylaxis:Eliquis Code Status: Full Family Communication: None at bedside Disposition Plan:  Status is: Observation The patient will require care spanning > 2 midnights and should be moved to inpatient because: Need for IV medications.   Consultants:  None  Procedures:  None  Antimicrobials:  None   Subjective: Patient seen and evaluated today with no new acute complaints or concerns. No acute concerns or events noted overnight.  He has mild shortness of breath, but denies any palpitations or chest pain or tightness.  Objective: Vitals:   07/08/22 1000 07/08/22 1030 07/08/22 1100 07/08/22 1117  BP: (!) 74/54 (!) 75/56 (!) 87/64   Pulse: 82 80 79   Resp: 18 (!) 21 (!) 21   Temp:    98.4 F (36.9 C)  TempSrc:    Oral  SpO2: 98% 96% 98%   Weight:      Height:         Intake/Output Summary (Last 24 hours) at 07/08/2022 1130 Last data filed at 07/08/2022 1008 Gross per 24 hour  Intake 1382.71 ml  Output 2201 ml  Net -818.29 ml   Filed Weights   07/07/22 1543 07/07/22 1949 07/08/22 0448  Weight: 81 kg 81.1 kg 82 kg    Examination:  General exam: Appears calm and comfortable  Respiratory system: Clear to auscultation. Respiratory effort normal.  Currently on 4 L nasal cannula. Cardiovascular system: S1 & S2 heard, irregular and tachycardic. Gastrointestinal system: Abdomen is soft Central nervous system: Alert and awake Extremities: No edema Skin: No significant lesions noted Psychiatry: Flat affect.    Data Reviewed: I have personally reviewed following labs and imaging studies  CBC: Recent Labs  Lab 07/05/22 0941 07/07/22 1610  WBC 6.7 6.0  NEUTROABS 3.3  --   HGB 15.1 16.4  HCT 45.1 48.6  MCV 91.3 91.2  PLT 156 169   Basic Metabolic Panel: Recent Labs  Lab 07/05/22 0941 07/07/22 1610 07/08/22 0458  NA 138 136 136  K 3.7 3.7 4.1  CL 103 99 105  CO2 21* 26 23  GLUCOSE 122* 140* 133*  BUN 13 15 16   CREATININE 0.88 0.87 0.80  CALCIUM 8.8* 8.8* 8.1*  MG 1.9  --   --    GFR: Estimated Creatinine Clearance: 83.7 mL/min (by C-G formula based on SCr of 0.8 mg/dL). Liver Function Tests: Recent Labs  Lab 07/05/22 0941 07/07/22 1610  AST 23  30  ALT 25 43  ALKPHOS 63 72  BILITOT 0.7 0.7  PROT 7.0 7.3  ALBUMIN 4.0 4.0   No results for input(s): "LIPASE", "AMYLASE" in the last 168 hours. No results for input(s): "AMMONIA" in the last 168 hours. Coagulation Profile: No results for input(s): "INR", "PROTIME" in the last 168 hours. Cardiac Enzymes: No results for input(s): "CKTOTAL", "CKMB", "CKMBINDEX", "TROPONINI" in the last 168 hours. BNP (last 3 results) No results for input(s): "PROBNP" in the last 8760 hours. HbA1C: No results for input(s): "HGBA1C" in the last 72 hours. CBG: Recent Labs  Lab 07/07/22 2029  07/08/22 0731 07/08/22 1117  GLUCAP 147* 124* 249*   Lipid Profile: Recent Labs    07/08/22 0458  CHOL 165  HDL 40*  LDLCALC 62  TRIG 604*  CHOLHDL 4.1   Thyroid Function Tests: Recent Labs    07/08/22 0458  TSH 0.697   Anemia Panel: No results for input(s): "VITAMINB12", "FOLATE", "FERRITIN", "TIBC", "IRON", "RETICCTPCT" in the last 72 hours. Sepsis Labs: No results for input(s): "PROCALCITON", "LATICACIDVEN" in the last 168 hours.  Recent Results (from the past 240 hour(s))  MRSA Next Gen by PCR, Nasal     Status: None   Collection Time: 07/07/22  7:47 PM   Specimen: Nasal Mucosa; Nasal Swab  Result Value Ref Range Status   MRSA by PCR Next Gen NOT DETECTED NOT DETECTED Final    Comment: (NOTE) The GeneXpert MRSA Assay (FDA approved for NASAL specimens only), is one component of a comprehensive MRSA colonization surveillance program. It is not intended to diagnose MRSA infection nor to guide or monitor treatment for MRSA infections. Test performance is not FDA approved in patients less than 23 years old. Performed at [facility], [address], [place], [place] [address]          Radiology Studies: No results found.      Scheduled Meds:  acidophilus  1 capsule Oral Daily   allopurinol  300 mg Oral Daily   apixaban  5 mg Oral BID   atorvastatin  20 mg Oral Daily   Chlorhexidine Gluconate Cloth  6 each Topical Daily   empagliflozin  20 mg Oral Daily   insulin aspart  0-15 Units Subcutaneous TID WC   insulin aspart  0-5 Units Subcutaneous QHS   metoprolol tartrate  50 mg Oral BID   Continuous Infusions:  sodium chloride 75 mL/hr at 07/08/22 1008   diltiazem (CARDIZEM) infusion 5 mg/hr (07/08/22 0857)     LOS: 0 days    Time spent: 35 minutes    Japleen Tornow Hoover Brunette, DO [facility]  If 7PM-7AM, please contact night-coverage [email or web address] 07/08/2022, 11:30 AM

## 2022-07-08 NOTE — Progress Notes (Signed)
   07/08/22 1730 07/08/22 1738 07/08/22 1742  Vitals  BP (!) 73/44 (!) 83/40 (!) 86/43  MAP (mmHg) (!) 51 (!) 55 (!) 58    07/08/22 1808 07/08/22 1830  Vitals  BP (!) 103/53 (!) 75/53  MAP (mmHg) 70 (!) 59    MD made aware of recent soft blood pressure's. Order for 500 mL bolus. Will continue to monitor and pass on to night shift RN.

## 2022-07-08 NOTE — Plan of Care (Signed)

## 2022-07-08 NOTE — Progress Notes (Signed)
  Echocardiogram 2D Echocardiogram has been performed.  James Miles 07/08/2022, 1:34 PM

## 2022-07-09 DIAGNOSIS — I4892 Unspecified atrial flutter: Secondary | ICD-10-CM | POA: Diagnosis not present

## 2022-07-09 LAB — GLUCOSE, CAPILLARY
Glucose-Capillary: 79 mg/dL (ref 70–99)
Glucose-Capillary: 143 mg/dL — ABNORMAL HIGH (ref 70–99)
Glucose-Capillary: 127 mg/dL — ABNORMAL HIGH (ref 70–99)
Glucose-Capillary: 156 mg/dL — ABNORMAL HIGH (ref 70–99)

## 2022-07-09 LAB — CBC
HCT: 44.5 % (ref 39.0–52.0)
Hemoglobin: 14.8 g/dL (ref 13.0–17.0)
MCH: 30.8 pg (ref 26.0–34.0)
MCHC: 33.3 g/dL (ref 30.0–36.0)
MCV: 92.7 fL (ref 80.0–100.0)
Platelets: 150 10*3/uL (ref 150–400)
RBC: 4.8 MIL/uL (ref 4.22–5.81)
RDW: 13.5 % (ref 11.5–15.5)
WBC: 5.6 10*3/uL (ref 4.0–10.5)
nRBC: 0 % (ref 0.0–0.2)

## 2022-07-09 LAB — BASIC METABOLIC PANEL
Anion gap: 10 (ref 5–15)
BUN: 16 mg/dL (ref 8–23)
CO2: 22 mmol/L (ref 22–32)
Calcium: 8.1 mg/dL — ABNORMAL LOW (ref 8.9–10.3)
Chloride: 103 mmol/L (ref 98–111)
Creatinine, Ser: 0.77 mg/dL (ref 0.61–1.24)
GFR, Estimated: >60 mL/min (ref >60)
Glucose, Bld: 84 mg/dL (ref 70–99)
Potassium: 4.1 mmol/L (ref 3.5–5.1)
Sodium: 135 mmol/L (ref 135–145)

## 2022-07-09 LAB — MAGNESIUM: Magnesium: 1.9 mg/dL (ref 1.7–2.4)

## 2022-07-09 MED ORDER — SODIUM CHLORIDE 0.9 % IV BOLUS
500.0000 mL | Freq: Once | INTRAVENOUS | Status: AC
  Administered 2022-07-09: 500 mL via INTRAVENOUS

## 2022-07-09 MED ORDER — NOREPINEPHRINE 4 MG/250ML-% IV SOLN
0.0000 ug/min | INTRAVENOUS | Status: DC
  Administered 2022-07-09: 2 ug/min via INTRAVENOUS
  Filled 2022-07-09: qty 250

## 2022-07-09 MED ORDER — AMIODARONE HCL IN DEXTROSE 360-4.14 MG/200ML-% IV SOLN
30.0000 mg/h | INTRAVENOUS | Status: DC
  Administered 2022-07-09 – 2022-07-10 (×2): 30 mg/h via INTRAVENOUS
  Filled 2022-07-09 (×2): qty 200

## 2022-07-09 MED ORDER — AMIODARONE HCL IN DEXTROSE 360-4.14 MG/200ML-% IV SOLN
60.0000 mg/h | INTRAVENOUS | Status: AC
  Administered 2022-07-09: 60 mg/h via INTRAVENOUS
  Filled 2022-07-09: qty 200

## 2022-07-09 NOTE — Progress Notes (Signed)
PROGRESS NOTE    James Miles  VHQ:[id number] DOB: 1945-12-27 DOA: 07/07/2022 PCP: James Miles [name]   Brief Narrative:    James Miles is a 77 y.o. male with medical history significant of adenocarcinoma of the rectum, COPD, diabetes, hyperlipidemia.  Patient was at the cancer center getting something disconnected and was found to have a heart rate in the 150s.  He was initially thought to have SVT and was given adenosine in the ED, but he quickly went back into tachyarrhythmia which was noted to be atrial flutter.  He was initially started on Cardizem drip with poor rate control noted and some hypotension and has been switched to amiodarone infusion 6/2.  Assessment & Plan:   Principal Problem:   Atrial flutter with rapid ventricular response (HCC) Active Problems:   Chronic obstructive pulmonary disease (HCC)   Rectal adenocarcinoma (HCC)   Diabetes mellitus without complication (HCC)   SVT (supraventricular tachycardia)  Assessment and Plan:   Atrial flutter with rapid ventricular response On Cardizem drip initially, now changed to amiodarone infusion 6/2 Continue Eliquis TSH 0.697 Echocardiogram during atrial fibrillation with LVEF 40-45% and global hypokinesis, likely will require repeat after cardioversion Plan for cardiology evaluation by Monday with likely need for TEE/DCCV, keep n.p.o. after midnight COPD Stable and compensated Rectal adenocarcinoma Currently on chemotherapy Imodium for some ongoing diarrhea   DVT prophylaxis:Eliquis Code Status: Full Family Communication: None at bedside Disposition Plan:  Status is: Inpatient Remains inpatient appropriate because: Need for IV medications and inpatient procedure  Consultants:  Cardiology  Procedures:  None  Antimicrobials:  None   Subjective: Patient seen and evaluated today with no new acute complaints or concerns. No acute concerns or events noted overnight.  He has mild shortness of  breath, but denies any palpitations or chest pain or tightness.  He is noted to have ongoing elevated heart rates this morning as well as soft blood pressure readings that have persisted overnight.  Objective: Vitals:   07/09/22 0800 07/09/22 0801 07/09/22 0802 07/09/22 0803  BP: (!) 60/48     Pulse: (!) 153 (!) 152 68 (!) 143  Resp: 20  18 15   Temp:      TempSrc:      SpO2: 97% 98% 97% 97%  Weight:      Height:        Intake/Output Summary (Last 24 hours) at 07/09/2022 0936 Last data filed at 07/09/2022 0913 Gross per 24 hour  Intake 2336.09 ml  Output 3200 ml  Net -863.91 ml   Filed Weights   07/07/22 1949 07/08/22 0448 07/09/22 0500  Weight: 81.1 kg 82 kg 82.4 kg    Examination:  General exam: Appears calm and comfortable  Respiratory system: Clear to auscultation. Respiratory effort normal.  Currently on 4 L nasal cannula. Cardiovascular system: S1 & S2 heard, irregular and tachycardic. Gastrointestinal system: Abdomen is soft Central nervous system: Alert and awake Extremities: No edema Skin: No significant lesions noted Psychiatry: Flat affect.    Data Reviewed: I have personally reviewed following labs and imaging studies  CBC: Recent Labs  Lab 07/05/22 0941 07/07/22 1610 07/09/22 0443  WBC 6.7 6.0 5.6  NEUTROABS 3.3  --   --   HGB 15.1 16.4 14.8  HCT 45.1 48.6 44.5  MCV 91.3 91.2 92.7  PLT 156 169 150   Basic Metabolic Panel: Recent Labs  Lab 07/05/22 0941 07/07/22 1610 07/08/22 0458 07/09/22 0443  NA 138 136 136 135  K 3.7 3.7  4.1 4.1  CL 103 99 105 103  CO2 21* 26 23 22   GLUCOSE 122* 140* 133* 84  BUN 13 15 16 16   CREATININE 0.88 0.87 0.80 0.77  CALCIUM 8.8* 8.8* 8.1* 8.1*  MG 1.9  --   --  1.9   GFR: Estimated Creatinine Clearance: 83.7 mL/min (by C-G formula based on SCr of 0.77 mg/dL). Liver Function Tests: Recent Labs  Lab 07/05/22 0941 07/07/22 1610  AST 23 30  ALT 25 43  ALKPHOS 63 72  BILITOT 0.7 0.7  PROT 7.0 7.3   ALBUMIN 4.0 4.0   No results for input(s): "LIPASE", "AMYLASE" in the last 168 hours. No results for input(s): "AMMONIA" in the last 168 hours. Coagulation Profile: No results for input(s): "INR", "PROTIME" in the last 168 hours. Cardiac Enzymes: No results for input(s): "CKTOTAL", "CKMB", "CKMBINDEX", "TROPONINI" in the last 168 hours. BNP (last 3 results) No results for input(s): "PROBNP" in the last 8760 hours. HbA1C: No results for input(s): "HGBA1C" in the last 72 hours. CBG: Recent Labs  Lab 07/08/22 0731 07/08/22 1117 07/08/22 1602 07/08/22 2158 07/09/22 0707  GLUCAP 124* 249* 102* 101* 79   Lipid Profile: Recent Labs    07/08/22 0458  CHOL 165  HDL 40*  LDLCALC 62  TRIG 409*  CHOLHDL 4.1   Thyroid Function Tests: Recent Labs    07/08/22 0458  TSH 0.697   Anemia Panel: No results for input(s): "VITAMINB12", "FOLATE", "FERRITIN", "TIBC", "IRON", "RETICCTPCT" in the last 72 hours. Sepsis Labs: No results for input(s): "PROCALCITON", "LATICACIDVEN" in the last 168 hours.  Recent Results (from the past 240 hour(s))  MRSA Next Gen by PCR, Nasal     Status: None   Collection Time: 07/07/22  7:47 PM   Specimen: Nasal Mucosa; Nasal Swab  Result Value Ref Range Status   MRSA by PCR Next Gen NOT DETECTED NOT DETECTED Final    Comment: (NOTE) The GeneXpert MRSA Assay (FDA approved for NASAL specimens only), is one component of a comprehensive MRSA colonization surveillance program. It is not intended to diagnose MRSA infection nor to guide or monitor treatment for MRSA infections. Test performance is not FDA approved in patients less than 69 years old. Performed at [facility], [address], [place], [place] [address]          Radiology Studies: ECHOCARDIOGRAM COMPLETE  Result Date: 07/08/2022    ECHOCARDIOGRAM REPORT   Patient Name:   James Miles Date of Exam: 07/08/2022 Medical Rec #:  [id number]       Height:       71.0 in Accession #:     [id number]      Weight:       180.8 lb Date of Birth:  1945/10/31       BSA:          2.020 m Patient Age:    77 years        BP:           93/66 mmHg Patient Gender: M               HR:           89 bpm. [facility] Procedure: 2D Echo, Intracardiac Opacification Agent, Color Doppler and Limited            Color Doppler Indications:    atrial flutter  History:        Patient has no prior history of Echocardiogram examinations.  COPD and Cancer; Risk Factors:Diabetes and Dyslipidemia.  Sonographer:    Delcie Roch RDCS Referring Phys: [id number] Jezabella Schriever D [facility]  Sonographer Comments: Technically difficult study due to poor echo windows. Image acquisition challenging due to respiratory motion. IMPRESSIONS  1. Afib. Left ventricular ejection fraction, by estimation, is 40 to 45%. The left ventricle has mildly decreased function. The left ventricle demonstrates global hypokinesis. There is mild left ventricular hypertrophy. Left ventricular diastolic parameters are indeterminate.  2. Right ventricular systolic function is normal. The right ventricular size is normal. There is normal pulmonary artery systolic pressure. The estimated right ventricular systolic pressure is 24.0 mmHg.  3. Left atrial size was mildly dilated.  4. The mitral valve is normal in structure. No evidence of mitral valve regurgitation. No evidence of mitral stenosis.  5. The aortic valve is normal in structure. Aortic valve regurgitation is not visualized. No aortic stenosis is present.  6. The inferior vena cava is dilated in size with >50% respiratory variability, suggesting right atrial pressure of 8 mmHg. FINDINGS  Left Ventricle: Afib. Left ventricular ejection fraction, by estimation, is 40 to 45%. The left ventricle has mildly decreased function. The left ventricle demonstrates global hypokinesis. The left ventricular internal cavity size was normal in size. There is mild left ventricular hypertrophy. Left  ventricular diastolic parameters are indeterminate. Right Ventricle: The right ventricular size is normal. No increase in right ventricular wall thickness. Right ventricular systolic function is normal. There is normal pulmonary artery systolic pressure. The tricuspid regurgitant velocity is 2.29 m/s, and  with an assumed right atrial pressure of 3 mmHg, the estimated right ventricular systolic pressure is 24.0 mmHg. Left Atrium: Left atrial size was mildly dilated. Right Atrium: Right atrial size was normal in size. Pericardium: There is no evidence of pericardial effusion. Mitral Valve: The mitral valve is normal in structure. No evidence of mitral valve regurgitation. No evidence of mitral valve stenosis. Tricuspid Valve: The tricuspid valve is normal in structure. Tricuspid valve regurgitation is trivial. No evidence of tricuspid stenosis. Aortic Valve: The aortic valve is normal in structure. Aortic valve regurgitation is not visualized. No aortic stenosis is present. Pulmonic Valve: The pulmonic valve was normal in structure. Pulmonic valve regurgitation is not visualized. No evidence of pulmonic stenosis. Aorta: The aortic root is normal in size and structure. Venous: The inferior vena cava is dilated in size with greater than 50% respiratory variability, suggesting right atrial pressure of 8 mmHg. IAS/Shunts: No atrial level shunt detected by color flow Doppler.  LEFT VENTRICLE PLAX 2D LVIDd:         5.20 cm LVIDs:         4.20 cm LV PW:         1.10 cm LV IVS:        1.20 cm LVOT diam:     2.20 cm LVOT Area:     3.80 cm  IVC IVC diam: 2.40 cm LEFT ATRIUM           Index LA diam:      4.10 cm 2.03 cm/m LA Vol (A4C): 58.7 ml 29.06 ml/m   AORTA Ao Root diam: 3.30 cm TRICUSPID VALVE TR Peak grad:   21.0 mmHg TR Vmax:        229.00 cm/s  SHUNTS Systemic Diam: 2.20 cm Donato Schultz MD Electronically signed by Donato Schultz MD Signature Date/Time: 07/08/2022/5:56:32 PM    Final         Scheduled Meds:   acidophilus  1 capsule  Oral Daily   allopurinol  300 mg Oral Daily   apixaban  5 mg Oral BID   atorvastatin  20 mg Oral Daily   Chlorhexidine Gluconate Cloth  6 each Topical Daily   empagliflozin  20 mg Oral Daily   insulin aspart  0-15 Units Subcutaneous TID WC   insulin aspart  0-5 Units Subcutaneous QHS   Continuous Infusions:  sodium chloride 75 mL/hr at 07/08/22 1910   amiodarone 60 mg/hr (07/09/22 0757)   amiodarone     norepinephrine (LEVOPHED) Adult infusion     sodium chloride       LOS: 1 day    Time spent: 35 minutes    Jalan Bodi Hoover Brunette, DO [facility]  If 7PM-7AM, please contact night-coverage [email or web address] 07/09/2022, 9:36 AM

## 2022-07-09 NOTE — TOC Initial Note (Signed)
Transition of Care [facility]) - Initial/Assessment Note    Patient Details  Name: James Miles MRN: [id number] Date of Birth: Jun 06, 1945  Transition of Care [facility]) CM/SW Contact:    Catalina Gravel, LCSW Phone Number: 07/09/2022, 11:53 AM  Clinical Narrative:                 Pt admitted and continued medical care. Will need to see Cards. DC 2-3 days.     Barriers to Discharge: Continued Medical Work up   Patient Goals and CMS Choice            Expected Discharge Plan and Services                                              Prior Living Arrangements/Services                       Activities of Daily Living Home Assistive Devices/Equipment: None ADL Screening (condition at time of admission) Patient's cognitive ability adequate to safely complete daily activities?: Yes Is the patient deaf or have difficulty hearing?: No Does the patient have difficulty seeing, even when wearing glasses/contacts?: No Does the patient have difficulty concentrating, remembering, or making decisions?: No Patient able to express need for assistance with ADLs?: Yes Does the patient have difficulty dressing or bathing?: No Independently performs ADLs?: Yes (appropriate for developmental age) Does the patient have difficulty walking or climbing stairs?: Yes Weakness of Legs: Both Weakness of Arms/Hands: None  Permission Sought/Granted                  Emotional Assessment              Admission diagnosis:  SVT (supraventricular tachycardia) [I47.10] Atrial flutter with rapid ventricular response (HCC) [I48.92] Malignant neoplasm of colon, unspecified part of colon (HCC) [C18.9] Patient Active Problem List   Diagnosis Date Noted   SVT (supraventricular tachycardia) 07/08/2022   Atrial flutter with rapid ventricular response (HCC) 07/07/2022   Diabetes mellitus without complication (HCC) 07/07/2022   Rectal adenocarcinoma (HCC) 05/07/2022   Gout 04/07/2019    Lower extremity edema 10/08/2018   Hyperlipidemia 10/08/2018   Chronic obstructive pulmonary disease (HCC) 10/08/2018   PCP:  [name] Pharmacy:   [facility] - [place], [place] - [address] [place] [address] Phone: [phone] Fax: [phone]     Social Determinants of Health (SDOH) Social History: SDOH Screenings   Food Insecurity: No Food Insecurity (07/07/2022)  Housing: Low Risk  (07/07/2022)  Transportation Needs: No Transportation Needs (07/07/2022)  Utilities: Not At Risk (07/07/2022)  Depression (PHQ2-9): Low Risk  (05/08/2022)  Tobacco Use: Medium Risk (07/07/2022)   SDOH Interventions:     Readmission Risk Interventions     No data to display

## 2022-07-09 NOTE — Discharge Instructions (Signed)

## 2022-07-10 ENCOUNTER — Telehealth: Payer: Self-pay | Admitting: *Deleted

## 2022-07-10 DIAGNOSIS — I4892 Unspecified atrial flutter: Secondary | ICD-10-CM

## 2022-07-10 LAB — CBC
HCT: 40.6 % (ref 39.0–52.0)
Hemoglobin: 13.5 g/dL (ref 13.0–17.0)
MCH: 30.7 pg (ref 26.0–34.0)
MCHC: 33.3 g/dL (ref 30.0–36.0)
MCV: 92.3 fL (ref 80.0–100.0)
Platelets: 142 10*3/uL — ABNORMAL LOW (ref 150–400)
RBC: 4.4 MIL/uL (ref 4.22–5.81)
RDW: 13.3 % (ref 11.5–15.5)
WBC: 5.4 10*3/uL (ref 4.0–10.5)
nRBC: 0 % (ref 0.0–0.2)

## 2022-07-10 LAB — MAGNESIUM: Magnesium: 1.8 mg/dL (ref 1.7–2.4)

## 2022-07-10 LAB — HEMOGLOBIN A1C
Hgb A1c MFr Bld: 7.8 % — ABNORMAL HIGH (ref 4.8–5.6)
Mean Plasma Glucose: 177 mg/dL

## 2022-07-10 LAB — BASIC METABOLIC PANEL
Anion gap: 9 (ref 5–15)
BUN: 16 mg/dL (ref 8–23)
CO2: 23 mmol/L (ref 22–32)
Calcium: 8 mg/dL — ABNORMAL LOW (ref 8.9–10.3)
Chloride: 103 mmol/L (ref 98–111)
Creatinine, Ser: 0.77 mg/dL (ref 0.61–1.24)
GFR, Estimated: >60 mL/min (ref >60)
Glucose, Bld: 90 mg/dL (ref 70–99)
Potassium: 3.8 mmol/L (ref 3.5–5.1)
Sodium: 135 mmol/L (ref 135–145)

## 2022-07-10 LAB — GLUCOSE, CAPILLARY
Glucose-Capillary: 93 mg/dL (ref 70–99)
Glucose-Capillary: 221 mg/dL — ABNORMAL HIGH (ref 70–99)

## 2022-07-10 MED ORDER — AMIODARONE HCL 200 MG PO TABS: 200.0000 mg | ORAL_TABLET | Freq: Every day | ORAL | 1 refills | Status: DC

## 2022-07-10 MED ORDER — APIXABAN 5 MG PO TABS: 5.0000 mg | ORAL_TABLET | Freq: Two times a day (BID) | ORAL | 0 refills | Status: DC

## 2022-07-10 MED ORDER — AMIODARONE HCL 200 MG PO TABS: 200.0000 mg | ORAL_TABLET | Freq: Two times a day (BID) | ORAL | 0 refills | Status: DC

## 2022-07-10 MED ORDER — AMIODARONE HCL 200 MG PO TABS
200.0000 mg | ORAL_TABLET | Freq: Two times a day (BID) | ORAL | Status: DC
  Administered 2022-07-10: 200 mg via ORAL
  Filled 2022-07-10: qty 1

## 2022-07-10 MED ORDER — AMIODARONE HCL 200 MG PO TABS: 200.0000 mg | ORAL_TABLET | Freq: Every day | ORAL | Status: DC

## 2022-07-10 NOTE — Discharge Summary (Signed)
Physician Discharge Summary  James Miles ZOX:[id number] DOB: [birth year] DOA: 07/07/2022  PCP: [name]  Admit date: 07/07/2022  Discharge date: 07/10/2022  Admitted From:Home  Disposition:  Home  Recommendations for Outpatient Follow-up:  Follow up with PCP in 1-2 weeks Follow-up with atrial fibrillation clinic with referral sent for repeat 2D echocardiogram in 6 weeks as well as further evaluation Continue amiodarone as prescribed with 200 mg twice daily for 7 days and then daily thereafter Continue Eliquis twice daily  Home Health: None  Equipment/Devices: None  Discharge Condition:Stable  CODE STATUS: Full  Diet recommendation: Heart Healthy/carb modified  Brief/Interim Summary: James Miles is a 77 y.o. male with medical history significant of adenocarcinoma of the rectum, COPD, diabetes, hyperlipidemia.  Patient was at the cancer center getting something disconnected and was found to have a heart rate in the 150s.  He was initially thought to have SVT and was given adenosine in the ED, but he quickly went back into tachyarrhythmia which was noted to be atrial flutter.  He was initially started on Cardizem drip with poor rate control noted and some hypotension and had been switched to amiodarone infusion 6/2.  He converted to sinus rhythm by 6/3 with stable heart rate control and blood pressures.  He was seen by cardiology and transition to oral amiodarone and is now in stable condition for discharge with further follow-up outpatient planned.  He will discharge with oral amiodarone as described above as well as Eliquis for anticoagulation.  No other acute events or concerns noted throughout the course of this admission.  Discharge Diagnoses:  Principal Problem:   Atrial flutter with rapid ventricular response (HCC) Active Problems:   Chronic obstructive pulmonary disease (HCC)   Rectal adenocarcinoma (HCC)   Diabetes mellitus without complication (HCC)    SVT (supraventricular tachycardia)  Principal discharge diagnosis: New onset atrial flutter with RVR.  Discharge Instructions  Discharge Instructions     Amb Referral to AFIB Clinic   Complete by: As directed    Diet - low sodium heart healthy   Complete by: As directed    Increase activity slowly   Complete by: As directed       Allergies as of 07/10/2022   No Known Allergies      Medication List     STOP taking these medications    lisinopril 20 MG tablet Commonly known as: ZESTRIL       TAKE these medications    acidophilus Caps capsule Take 1 capsule by mouth daily.   albuterol 108 (90 Base) MCG/ACT inhaler Commonly known as: VENTOLIN HFA Inhale 2 puffs into the lungs every 6 (six) hours as needed for wheezing or shortness of breath.   allopurinol 300 MG tablet Commonly known as: ZYLOPRIM Take 300 mg by mouth daily.   amiodarone 200 MG tablet Commonly known as: PACERONE Take 1 tablet (200 mg total) by mouth 2 (two) times daily for 7 days.   amiodarone 200 MG tablet Commonly known as: PACERONE Take 1 tablet (200 mg total) by mouth daily. Start taking on: July 17, 2022   apixaban 5 MG Tabs tablet Commonly known as: ELIQUIS Take 1 tablet (5 mg total) by mouth 2 (two) times daily.   atorvastatin 20 MG tablet Commonly known as: LIPITOR Take 20 mg by mouth daily.   dextrose 5 % SOLN 1,000 mL with fluorouracil 5 GM/100ML SOLN Inject into the vein.   Jardiance 10 MG Tabs tablet Generic drug: empagliflozin Take 20  mg by mouth daily.   LEUCOVORIN CALCIUM IV Inject into the vein.   lidocaine-prilocaine cream Commonly known as: EMLA Apply 1 Application topically as needed.   lidocaine-prilocaine cream Commonly known as: EMLA Apply to affected area once   loperamide 2 MG capsule Commonly known as: IMODIUM Take 2 mg by mouth 3 (three) times daily as needed for diarrhea or loose stools.   MELATONIN PO Take 1 tablet by mouth at bedtime as needed  (sleep).   OXALIPLATIN IV Inject into the vein.   prochlorperazine 10 MG tablet Commonly known as: COMPAZINE Take 1 tablet (10 mg total) by mouth every 6 (six) hours as needed for nausea or vomiting.   prochlorperazine 10 MG tablet Commonly known as: COMPAZINE Take 1 tablet (10 mg total) by mouth every 6 (six) hours as needed for nausea or vomiting.        Follow-up Information     [name], [facility]. Schedule an appointment as soon as possible for a visit in 1 week(s).   Contact information: [address] [place] [address] [phone]                No Known Allergies  Consultations: Cardiology   Procedures/Studies: ECHOCARDIOGRAM COMPLETE  Result Date: 07/08/2022    ECHOCARDIOGRAM REPORT   Patient Name:   James Miles Date of Exam: 07/08/2022 Medical Rec #:  [id number]       Height:       71.0 in Accession #:    [id number]      Weight:       180.8 lb Date of Birth:  [birth year]       BSA:          2.020 m Patient Age:    77 years        BP:           93/66 mmHg Patient Gender: M               HR:           89 bpm. [facility] Procedure: 2D Echo, Intracardiac Opacification Agent, Color Doppler and Limited            Color Doppler Indications:    atrial flutter  History:        Patient has no prior history of Echocardiogram examinations.                 COPD and Cancer; Risk Factors:Diabetes and Dyslipidemia.  Sonographer:    Delcie Roch RDCS Referring Phys: [id number] Camilia Caywood D [facility]  Sonographer Comments: Technically difficult study due to poor echo windows. Image acquisition challenging due to respiratory motion. IMPRESSIONS  1. Afib. Left ventricular ejection fraction, by estimation, is 40 to 45%. The left ventricle has mildly decreased function. The left ventricle demonstrates global hypokinesis. There is mild left ventricular hypertrophy. Left ventricular diastolic parameters are indeterminate.  2. Right ventricular systolic function is normal.  The right ventricular size is normal. There is normal pulmonary artery systolic pressure. The estimated right ventricular systolic pressure is 24.0 mmHg.  3. Left atrial size was mildly dilated.  4. The mitral valve is normal in structure. No evidence of mitral valve regurgitation. No evidence of mitral stenosis.  5. The aortic valve is normal in structure. Aortic valve regurgitation is not visualized. No aortic stenosis is present.  6. The inferior vena cava is dilated in size with >50% respiratory variability, suggesting right atrial pressure of 8 mmHg.  FINDINGS  Left Ventricle: Afib. Left ventricular ejection fraction, by estimation, is 40 to 45%. The left ventricle has mildly decreased function. The left ventricle demonstrates global hypokinesis. The left ventricular internal cavity size was normal in size. There is mild left ventricular hypertrophy. Left ventricular diastolic parameters are indeterminate. Right Ventricle: The right ventricular size is normal. No increase in right ventricular wall thickness. Right ventricular systolic function is normal. There is normal pulmonary artery systolic pressure. The tricuspid regurgitant velocity is 2.29 m/s, and  with an assumed right atrial pressure of 3 mmHg, the estimated right ventricular systolic pressure is 24.0 mmHg. Left Atrium: Left atrial size was mildly dilated. Right Atrium: Right atrial size was normal in size. Pericardium: There is no evidence of pericardial effusion. Mitral Valve: The mitral valve is normal in structure. No evidence of mitral valve regurgitation. No evidence of mitral valve stenosis. Tricuspid Valve: The tricuspid valve is normal in structure. Tricuspid valve regurgitation is trivial. No evidence of tricuspid stenosis. Aortic Valve: The aortic valve is normal in structure. Aortic valve regurgitation is not visualized. No aortic stenosis is present. Pulmonic Valve: The pulmonic valve was normal in structure. Pulmonic valve regurgitation  is not visualized. No evidence of pulmonic stenosis. Aorta: The aortic root is normal in size and structure. Venous: The inferior vena cava is dilated in size with greater than 50% respiratory variability, suggesting right atrial pressure of 8 mmHg. IAS/Shunts: No atrial level shunt detected by color flow Doppler.  LEFT VENTRICLE PLAX 2D LVIDd:         5.20 cm LVIDs:         4.20 cm LV PW:         1.10 cm LV IVS:        1.20 cm LVOT diam:     2.20 cm LVOT Area:     3.80 cm  IVC IVC diam: 2.40 cm LEFT ATRIUM           Index LA diam:      4.10 cm 2.03 cm/m LA Vol (A4C): 58.7 ml 29.06 ml/m   AORTA Ao Root diam: 3.30 cm TRICUSPID VALVE TR Peak grad:   21.0 mmHg TR Vmax:        229.00 cm/s  SHUNTS Systemic Diam: 2.20 cm Donato Schultz MD Electronically signed by Donato Schultz MD Signature Date/Time: 07/08/2022/5:56:32 PM    Final      Discharge Exam: Vitals:   07/10/22 0800 07/10/22 1016  BP: 129/64 121/70  Pulse: 63 75  Resp: 10 18  Temp:  97.9 F (36.6 C)  SpO2: 96% 95%   Vitals:   07/10/22 0700 07/10/22 0800 07/10/22 1011 07/10/22 1016  BP: 133/76 129/64  121/70  Pulse: 64 63  75  Resp: 14 10  18   Temp: 98.3 F (36.8 C)   97.9 F (36.6 C)  TempSrc: Oral   Oral  SpO2: 99% 96%  95%  Weight:   80.8 kg   Height:   5\' 11"  (1.803 m)     General: Pt is alert, awake, not in acute distress Cardiovascular: RRR, S1/S2 +, no rubs, no gallops Respiratory: CTA bilaterally, no wheezing, no rhonchi Abdominal: Soft, NT, ND, bowel sounds + Extremities: no edema, no cyanosis    The results of significant diagnostics from this hospitalization (including imaging, microbiology, ancillary and laboratory) are listed below for reference.     Microbiology: Recent Results (from the past 240 hour(s))  MRSA Next Gen by PCR, Nasal     Status:  None   Collection Time: 07/07/22  7:47 PM   Specimen: Nasal Mucosa; Nasal Swab  Result Value Ref Range Status   MRSA by PCR Next Gen NOT DETECTED NOT DETECTED Final     Comment: (NOTE) The GeneXpert MRSA Assay (FDA approved for NASAL specimens only), is one component of a comprehensive MRSA colonization surveillance program. It is not intended to diagnose MRSA infection nor to guide or monitor treatment for MRSA infections. Test performance is not FDA approved in patients less than 58 years old. Performed at [facility], [address], [place], [place] [address]      Labs: BNP (last 3 results) No results for input(s): "BNP" in the last 8760 hours. Basic Metabolic Panel: Recent Labs  Lab 07/05/22 0941 07/07/22 1610 07/08/22 0458 07/09/22 0443 07/10/22 0347  NA 138 136 136 135 135  K 3.7 3.7 4.1 4.1 3.8  CL 103 99 105 103 103  CO2 21* 26 23 22 23   GLUCOSE 122* 140* 133* 84 90  BUN 13 15 16 16 16   CREATININE 0.88 0.87 0.80 0.77 0.77  CALCIUM 8.8* 8.8* 8.1* 8.1* 8.0*  MG 1.9  --   --  1.9 1.8   Liver Function Tests: Recent Labs  Lab 07/05/22 0941 07/07/22 1610  AST 23 30  ALT 25 43  ALKPHOS 63 72  BILITOT 0.7 0.7  PROT 7.0 7.3  ALBUMIN 4.0 4.0   No results for input(s): "LIPASE", "AMYLASE" in the last 168 hours. No results for input(s): "AMMONIA" in the last 168 hours. CBC: Recent Labs  Lab 07/05/22 0941 07/07/22 1610 07/09/22 0443 07/10/22 0347  WBC 6.7 6.0 5.6 5.4  NEUTROABS 3.3  --   --   --   HGB 15.1 16.4 14.8 13.5  HCT 45.1 48.6 44.5 40.6  MCV 91.3 91.2 92.7 92.3  PLT 156 169 150 142*   Cardiac Enzymes: No results for input(s): "CKTOTAL", "CKMB", "CKMBINDEX", "TROPONINI" in the last 168 hours. BNP: Invalid input(s): "POCBNP" CBG: Recent Labs  Lab 07/09/22 0707 07/09/22 1136 07/09/22 1645 07/09/22 2027 07/10/22 0719  GLUCAP 79 143* 127* 156* 93   D-Dimer No results for input(s): "DDIMER" in the last 72 hours. Hgb A1c No results for input(s): "HGBA1C" in the last 72 hours. Lipid Profile Recent Labs    07/08/22 0458  CHOL 165  HDL 40*  LDLCALC 62  TRIG 604*  CHOLHDL 4.1   Thyroid function  studies Recent Labs    07/08/22 0458  TSH 0.697   Anemia work up No results for input(s): "VITAMINB12", "FOLATE", "FERRITIN", "TIBC", "IRON", "RETICCTPCT" in the last 72 hours. Urinalysis    Component Value Date/Time   APPEARANCEUR Clear 06/02/2022 0852   GLUCOSEU 3+ (A) 06/02/2022 0852   BILIRUBINUR Negative 06/02/2022 0852   PROTEINUR Negative 06/02/2022 0852   NITRITE Negative 06/02/2022 0852   LEUKOCYTESUR Negative 06/02/2022 0852   Sepsis Labs Recent Labs  Lab 07/05/22 0941 07/07/22 1610 07/09/22 0443 07/10/22 0347  WBC 6.7 6.0 5.6 5.4   Microbiology Recent Results (from the past 240 hour(s))  MRSA Next Gen by PCR, Nasal     Status: None   Collection Time: 07/07/22  7:47 PM   Specimen: Nasal Mucosa; Nasal Swab  Result Value Ref Range Status   MRSA by PCR Next Gen NOT DETECTED NOT DETECTED Final    Comment: (NOTE) The GeneXpert MRSA Assay (FDA approved for NASAL specimens only), is one component of a comprehensive MRSA colonization surveillance program. It is not intended to  diagnose MRSA infection nor to guide or monitor treatment for MRSA infections. Test performance is not FDA approved in patients less than 55 years old. Performed at [facility], [address], [place], [place] [address]      Time coordinating discharge: 35 minutes  SIGNED:   Erick Blinks, DO [facility] 07/10/2022, 10:20 AM  If 7PM-7AM, please contact night-coverage [email or web address]

## 2022-07-10 NOTE — Evaluation (Signed)
Physical Therapy Evaluation Patient Details Name: James Miles MRN: [id number] DOB: 06-07-1945 Today's Date: 07/10/2022  History of Present Illness  James Miles is a 77 y.o. male with medical history significant of adenocarcinoma of the rectum, COPD, diabetes, hyperlipidemia.  Patient was at the cancer center getting something disconnected and was found to have a heart rate in the 150s.  Patient was asymptomatic.  It appears that he was rechecked after a while and his heart rate remained in the 150s, so the patient was sent to the emergency department for evaluation.  In the emergency department, the patient was found to be in a rapid narrow tachycardia.  He spontaneously converted initially, but then went back into the tachyarrhythmia.  He was given adenosine 6 mg, which converted him to sinus rhythm again, however he went back into tachyarrhythmia.  On subsequent doses of adenosine, it appeared that the patient was in atrial flutter.  This was not captured on telemetry.  Cardizem was started and the patient was converted into sinus rhythm.  Patient is unaware that he goes in and out of tachycardia.  He is not currently on beta-blocker.   Clinical Impression  Patient functioning near baseline for functional mobility and gait other than c/o SOB with SpO2 dropping from 93% to 84% during ambulation and required 2-3 minutes of sitting at bedside before SpO2 increased above 90% - nursing staff, MD notified.  Patient otherwise demonstrates good return for bed mobility, transfers and ambulating in hallway without requiring use of AD.  PLAN:  Patient to be discharged home today and discharged from acute physical therapy to care of nursing for ambulation as tolerated for length of stay with recommendations stated below         Recommendations for follow up therapy are one component of a multi-disciplinary discharge planning process, led by the attending physician.  Recommendations may be updated based on  patient status, additional functional criteria and insurance authorization.  Follow Up Recommendations       Assistance Recommended at Discharge PRN  Patient can return home with the following  Other (comment) (near baseline)    Equipment Recommendations None recommended by PT  Recommendations for Other Services       Functional Status Assessment Patient has had a recent decline in their functional status and/or demonstrates limited ability to make significant improvements in function in a reasonable and predictable amount of time     Precautions / Restrictions Precautions Precautions: None Restrictions Weight Bearing Restrictions: No      Mobility  Bed Mobility Overal bed mobility: Independent                  Transfers Overall transfer level: Modified independent                      Ambulation/Gait Ambulation/Gait assistance: Modified independent (Device/Increase time) Gait Distance (Feet): 55 Feet Assistive device: None Gait Pattern/deviations: Decreased step length - right, Decreased step length - left, Decreased stride length Gait velocity: decreased     General Gait Details: slightly labored cadence without loss of balance, limited mostly due to SOB with SpO2 dropping from 93% to 84% on room air  Stairs            Wheelchair Mobility    Modified Rankin (Stroke Patients Only)       Balance Overall balance assessment: No apparent balance deficits (not formally assessed)  Pertinent Vitals/Pain Pain Assessment Pain Assessment: No/denies pain    Home Living Family/patient expects to be discharged to:: Private residence Living Arrangements: Children Available Help at Discharge: Family;Available 24 hours/day Type of Home: House Home Access: Level entry       Home Layout: One level Home Equipment: Grab bars - tub/shower      Prior Function Prior Level of Function :  Independent/Modified Independent;Driving             Mobility Comments: household and short distanced community ambulator without AD ADLs Comments: Independent     Hand Dominance        Extremity/Trunk Assessment   Upper Extremity Assessment Upper Extremity Assessment: Overall WFL for tasks assessed    Lower Extremity Assessment Lower Extremity Assessment: Overall WFL for tasks assessed    Cervical / Trunk Assessment Cervical / Trunk Assessment: Normal  Communication   Communication: No difficulties  Cognition Arousal/Alertness: Awake/alert Behavior During Therapy: WFL for tasks assessed/performed Overall Cognitive Status: Within Functional Limits for tasks assessed                                          General Comments      Exercises     Assessment/Plan    PT Assessment Patient does not need any further PT services  PT Problem List         PT Treatment Interventions      PT Goals (Current goals can be found in the Care Plan section)  Acute Rehab PT Goals Patient Stated Goal: return home with family to assist PT Goal Formulation: With patient Time For Goal Achievement: 07/10/22 Potential to Achieve Goals: Good    Frequency       Co-evaluation               AM-PAC PT "6 Clicks" Mobility  Outcome Measure Help needed turning from your back to your side while in a flat bed without using bedrails?: None Help needed moving from lying on your back to sitting on the side of a flat bed without using bedrails?: None Help needed moving to and from a bed to a chair (including a wheelchair)?: None Help needed standing up from a chair using your arms (e.g., wheelchair or bedside chair)?: None Help needed to walk in hospital room?: None Help needed climbing 3-5 steps with a railing? : A Little 6 Click Score: 23    End of Session   Activity Tolerance: Patient tolerated treatment well;Patient limited by fatigue Patient left: in  bed;with call bell/phone within reach Nurse Communication: Mobility status PT Visit Diagnosis: Unsteadiness on feet (R26.81);Other abnormalities of gait and mobility (R26.89);Muscle weakness (generalized) (M62.81)    Time: 1032-1100 PT Time Calculation (min) (ACUTE ONLY): 28 min   Charges:   PT Evaluation $PT Eval Moderate Complexity: 1 Mod PT Treatments $Therapeutic Activity: 23-37 mins        1:47 PM, 07/10/22 Ocie Bob, MPT Physical Therapist with [facility] 336 [phone] office [phone] mobile phone

## 2022-07-10 NOTE — TOC Transition Note (Signed)
Transition of Care [facility]) - CM/SW Discharge Note   Patient Details  Name: James Miles MRN: [id number] Date of Birth: 09/20/1945  [facility] CM/SW Contact:  Karn Cassis, LCSW Phone Number: 07/10/2022, 12:08 PM   Clinical Narrative:  Pt will require home O2. Discussed with pt who is agreeable to [employer]. Referred to Mitch with [employer]. Mitch notified of d/c today. Sat qual note in. Requested home O2 order. No other needs reported.      Final next level of care: Home/Self Care Barriers to Discharge: Barriers Resolved   Patient Goals and CMS Choice      Discharge Placement                      Patient and family notified of of transfer: 07/10/22  Discharge Plan and Services Additional resources added to the After Visit Summary for                  DME Arranged: Oxygen DME Agency: [employer] Date DME Agency Contacted: 07/10/22 Time DME Agency Contacted: 1208 Representative spoke with at DME Agency: Marthann Schiller            Social Determinants of Health (SDOH) Interventions SDOH Screenings   Food Insecurity: No Food Insecurity (07/07/2022)  Housing: Low Risk  (07/07/2022)  Transportation Needs: No Transportation Needs (07/07/2022)  Utilities: Not At Risk (07/07/2022)  Depression (PHQ2-9): Low Risk  (05/08/2022)  Tobacco Use: Medium Risk (07/07/2022)     Readmission Risk Interventions     No data to display

## 2022-07-10 NOTE — Progress Notes (Addendum)
SATURATION QUALIFICATIONS: (This note is used to comply with regulatory documentation for home oxygen)  Patient Saturations on Room Air at Rest = 88%  Patient Saturations on Room Air while Ambulating = 85%  Patient Saturations on 2 Liters of oxygen while Ambulating = 94%  Please briefly explain why patient needs home oxygen: pt desats while sitting without ambulation and desats even further while ambulating.

## 2022-07-10 NOTE — Progress Notes (Signed)
Patient will require 2 L of oxygen at home with ambulation based on saturation qualification note.

## 2022-07-10 NOTE — Progress Notes (Addendum)
Patient discharged home with instructions given on medications and follow up visits,patient verbalized understanding. Prescriptions sent to Pharmacy of choice documented on AVS. IV discontinued, catheter intact. Accompanied by staff to an awaiting vehicle. 

## 2022-07-10 NOTE — Consult Note (Addendum)
Cardiology Consultation   Patient ID: James Miles MRN: [id number]; DOB: [birth year]  Admit date: 07/07/2022 Date of Consult: 07/10/2022  PCP:  [name]   [facility] Providers Cardiologist:  Charlton Haws, MD        Patient Profile:   James Miles is a 77 y.o. male with a hx of DM2, COPD, stage 2 low rectal adenocarcinoma  who is being seen 07/10/2022 for the evaluation of Aflutter at the request of Dr. Sherryll Burger.  History of Present Illness:   James Miles is a 77 yo male with no prior cardiac history. He finished chemo on Friday and vitals showed elevated HR so sent to ED. He was short of breath walking to chemo but otherwise asymptomatic-no chest pain, palpitations.He gets relief from inhaler. He has chronic venous insuffiency with LE edema improves with raising legs. He has DM2, former smoker-quit 5 yrs ago but >50 pack yrs. Initially felt to have SVT in ED and given adenosine and converted. Then was found to have Aflutter and started on dilt, eliquis and developed hypotension on dilt so is now on amiodarone. He has converted to NSR. Echo yest while in Afib EF 40-45% global HK, LA mildly dilated.    Past Medical History:  Diagnosis Date   Back pain    COPD (chronic obstructive pulmonary disease) (HCC)    Diabetes mellitus without complication (HCC)    Hyperlipidemia     Past Surgical History:  Procedure Laterality Date   BIOPSY  04/26/2022   Procedure: BIOPSY;  Surgeon: Corbin Ade, MD;  Location: [facility];  Service: Endoscopy;;   COLONOSCOPY WITH PROPOFOL N/A 04/26/2022   Procedure: COLONOSCOPY WITH PROPOFOL;  Surgeon: Corbin Ade, MD;  Location: [facility];  Service: Endoscopy;  Laterality: N/A;  10:00am; ASA 3   hemorhoidectomy     IR IMAGING GUIDED PORT INSERTION  05/12/2022   POLYPECTOMY  04/26/2022   Procedure: POLYPECTOMY;  Surgeon: Corbin Ade, MD;  Location: [facility];  Service: Endoscopy;;     Home Medications:  Prior  to Admission medications   Medication Sig Start Date End Date Taking? Authorizing Provider  acidophilus (RISAQUAD) CAPS capsule Take 1 capsule by mouth daily.   Yes [provider]  albuterol (VENTOLIN HFA) 108 (90 Base) MCG/ACT inhaler Inhale 2 puffs into the lungs every 6 (six) hours as needed for wheezing or shortness of breath. 03/29/19  Yes [provider]  allopurinol (ZYLOPRIM) 300 MG tablet Take 300 mg by mouth daily. 03/29/19  Yes [provider]  atorvastatin (LIPITOR) 20 MG tablet Take 20 mg by mouth daily. 03/29/19  Yes [provider]  dextrose 5 % SOLN 1,000 mL with fluorouracil 5 GM/100ML SOLN Inject into the vein.   Yes [provider]  JARDIANCE 10 MG TABS tablet Take 20 mg by mouth daily.   Yes [provider]  LEUCOVORIN CALCIUM IV Inject into the vein.   Yes [provider]  lidocaine-prilocaine (EMLA) cream Apply 1 Application topically as needed. 05/17/22  Yes Doreatha Massed, MD  lisinopril (ZESTRIL) 20 MG tablet Take 20 mg by mouth daily.   Yes [provider]  MELATONIN PO Take 1 tablet by mouth at bedtime as needed (sleep).   Yes [provider]  OXALIPLATIN IV Inject into the vein.   Yes [provider]  prochlorperazine (COMPAZINE) 10 MG tablet Take 1 tablet (10 mg total) by mouth every 6 (six) hours as needed  for nausea or vomiting. 06/07/22  Yes Doreatha Massed, MD  lidocaine-prilocaine (EMLA) cream Apply to affected area once Patient not taking: Reported on 07/08/2022 06/07/22   Doreatha Massed, MD  loperamide (IMODIUM) 2 MG capsule Take 2 mg by mouth 3 (three) times daily as needed for diarrhea or loose stools.    [provider]  prochlorperazine (COMPAZINE) 10 MG tablet Take 1 tablet (10 mg total) by mouth every 6 (six) hours as needed for nausea or vomiting. Patient not taking: Reported on 07/08/2022 05/17/22   Doreatha Massed, MD    Inpatient  Medications: Scheduled Meds:  acidophilus  1 capsule Oral Daily   allopurinol  300 mg Oral Daily   apixaban  5 mg Oral BID   atorvastatin  20 mg Oral Daily   Chlorhexidine Gluconate Cloth  6 each Topical Daily   empagliflozin  20 mg Oral Daily   insulin aspart  0-15 Units Subcutaneous TID WC   insulin aspart  0-5 Units Subcutaneous QHS   Continuous Infusions:  sodium chloride 75 mL/hr at 07/10/22 0342   amiodarone 30 mg/hr (07/10/22 0212)   norepinephrine (LEVOPHED) Adult infusion Stopped (07/10/22 0030)   PRN Meds: acetaminophen, albuterol, loperamide, melatonin, ondansetron (ZOFRAN) IV, mouth rinse, prochlorperazine  Allergies:   No Known Allergies  Social History:   Social History   Socioeconomic History   Marital status: Widowed    Spouse name: Not on file   Number of children: 2   Years of education: Not on file   Highest education level: Not on file  Occupational History   Not on file  Tobacco Use   Smoking status: Former    Years: 50    Types: Cigarettes    Quit date: 08/2016    Years since quitting: 5.9   Smokeless tobacco: Never  Vaping Use   Vaping Use: Never used  Substance and Sexual Activity   Alcohol use: Not on file   Drug use: Never   Sexual activity: Not Currently  Other Topics Concern   Not on file  Social History Narrative   Not on file   Social Determinants of Health   Financial Resource Strain: Not on file  Food Insecurity: No Food Insecurity (07/07/2022)   Hunger Vital Sign    Worried About Running Out of Food in the Last Year: Never true    Ran Out of Food in the Last Year: Never true  Transportation Needs: No Transportation Needs (07/07/2022)   PRAPARE - Administrator, Civil Service (Medical): No    Lack of Transportation (Non-Medical): No  Physical Activity: Not on file  Stress: Not on file  Social Connections: Not on file  Intimate Partner Violence: Not At Risk (07/07/2022)   Humiliation, Afraid, Rape, and Kick  questionnaire    Fear of Current or Ex-Partner: No    Emotionally Abused: No    Physically Abused: No    Sexually Abused: No    Family History:   Father died MI 58 also had CVA   ROS:  Please see the history of present illness.  Review of Systems  Constitutional: Negative.  HENT: Negative.    Cardiovascular:  Positive for dyspnea on exertion and leg swelling.  Respiratory: Negative.    Endocrine: Negative.   Hematologic/Lymphatic: Negative.   Musculoskeletal: Negative.   Gastrointestinal: Negative.   Genitourinary: Negative.   Neurological: Negative.     All other ROS reviewed and negative.     Physical Exam/Data:   Vitals:  07/10/22 0400 07/10/22 0500 07/10/22 0600 07/10/22 0700  BP: 121/77 (!) 142/85 131/67 133/76  Pulse: 62 67 63 64  Resp: 13 15 14 14   Temp: 98.4 F (36.9 C)   98.3 F (36.8 C)  TempSrc:    Oral  SpO2: 98% 100% 98% 99%  Weight:      Height:        Intake/Output Summary (Last 24 hours) at 07/10/2022 0833 Last data filed at 07/10/2022 0600 Gross per 24 hour  Intake 4436.81 ml  Output 2800 ml  Net 1636.81 ml      07/09/2022    5:00 AM 07/08/2022    4:48 AM 07/07/2022    7:49 PM  Last 3 Weights  Weight (lbs) 181 lb 10.5 oz 180 lb 12.4 oz 178 lb 12.7 oz  Weight (kg) 82.4 kg 82 kg 81.1 kg     Body mass index is 25.34 kg/m.  General:  Well nourished, well developed, in no acute distress  HEENT: normal Neck: no JVD Vascular: No carotid bruits; Distal pulses 2+ bilaterally Cardiac:  normal S1, S2; RRR; no murmur   Lungs:  clear to auscultation bilaterally, no wheezing, rhonchi or rales  Abd: soft, nontender, no hepatomegaly  Ext: no edema Musculoskeletal:  No deformities, BUE and BLE strength normal and equal Skin: warm and dry  Neuro:  CNs 2-12 intact, no focal abnormalities noted Psych:  Normal affect   EKG:  The EKG was personally reviewed and demonstrates:  NSR lots of artifact Telemetry:  Telemetry was personally reviewed and  demonstrates:  Aflutter converted to NSR  Relevant CV Studies: See below   Laboratory Data:  High Sensitivity Troponin:   Recent Labs  Lab 07/07/22 1610  TROPONINIHS 11     Chemistry Recent Labs  Lab 07/05/22 0941 07/07/22 1610 07/08/22 0458 07/09/22 0443 07/10/22 0347  NA 138   < > 136 135 135  K 3.7   < > 4.1 4.1 3.8  CL 103   < > 105 103 103  CO2 21*   < > 23 22 23   GLUCOSE 122*   < > 133* 84 90  BUN 13   < > 16 16 16   CREATININE 0.88   < > 0.80 0.77 0.77  CALCIUM 8.8*   < > 8.1* 8.1* 8.0*  MG 1.9  --   --  1.9 1.8  GFRNONAA >60   < > >60 >60 >60  ANIONGAP 14   < > 8 10 9    < > = values in this interval not displayed.    Recent Labs  Lab 07/05/22 0941 07/07/22 1610  PROT 7.0 7.3  ALBUMIN 4.0 4.0  AST 23 30  ALT 25 43  ALKPHOS 63 72  BILITOT 0.7 0.7   Lipids  Recent Labs  Lab 07/08/22 0458  CHOL 165  TRIG 313*  HDL 40*  LDLCALC 62  CHOLHDL 4.1    Hematology Recent Labs  Lab 07/07/22 1610 07/09/22 0443 07/10/22 0347  WBC 6.0 5.6 5.4  RBC 5.33 4.80 4.40  HGB 16.4 14.8 13.5  HCT 48.6 44.5 40.6  MCV 91.2 92.7 92.3  MCH 30.8 30.8 30.7  MCHC 33.7 33.3 33.3  RDW 13.9 13.5 13.3  PLT 169 150 142*   Thyroid  Recent Labs  Lab 07/08/22 0458  TSH 0.697    BNPNo results for input(s): "BNP", "PROBNP" in the last 168 hours.  DDimer No results for input(s): "DDIMER" in the last 168 hours.   Radiology/Studies:  ECHOCARDIOGRAM COMPLETE  Result Date: 07/08/2022    ECHOCARDIOGRAM REPORT   Patient Name:   James Miles Date of Exam: 07/08/2022 Medical Rec #:  [id number]       Height:       71.0 in Accession #:    [id number]      Weight:       180.8 lb Date of Birth:  December 14, 1945       BSA:          2.020 m Patient Age:    77 years        BP:           93/66 mmHg Patient Gender: M               HR:           89 bpm. [facility] Procedure: 2D Echo, Intracardiac Opacification Agent, Color Doppler and Limited            Color Doppler  Indications:    atrial flutter  History:        Patient has no prior history of Echocardiogram examinations.                 COPD and Cancer; Risk Factors:Diabetes and Dyslipidemia.  Sonographer:    Delcie Roch RDCS Referring Phys: [id number] PRATIK D [facility]  Sonographer Comments: Technically difficult study due to poor echo windows. Image acquisition challenging due to respiratory motion. IMPRESSIONS  1. Afib. Left ventricular ejection fraction, by estimation, is 40 to 45%. The left ventricle has mildly decreased function. The left ventricle demonstrates global hypokinesis. There is mild left ventricular hypertrophy. Left ventricular diastolic parameters are indeterminate.  2. Right ventricular systolic function is normal. The right ventricular size is normal. There is normal pulmonary artery systolic pressure. The estimated right ventricular systolic pressure is 24.0 mmHg.  3. Left atrial size was mildly dilated.  4. The mitral valve is normal in structure. No evidence of mitral valve regurgitation. No evidence of mitral stenosis.  5. The aortic valve is normal in structure. Aortic valve regurgitation is not visualized. No aortic stenosis is present.  6. The inferior vena cava is dilated in size with >50% respiratory variability, suggesting right atrial pressure of 8 mmHg. FINDINGS  Left Ventricle: Afib. Left ventricular ejection fraction, by estimation, is 40 to 45%. The left ventricle has mildly decreased function. The left ventricle demonstrates global hypokinesis. The left ventricular internal cavity size was normal in size. There is mild left ventricular hypertrophy. Left ventricular diastolic parameters are indeterminate. Right Ventricle: The right ventricular size is normal. No increase in right ventricular wall thickness. Right ventricular systolic function is normal. There is normal pulmonary artery systolic pressure. The tricuspid regurgitant velocity is 2.29 m/s, and  with an assumed right atrial  pressure of 3 mmHg, the estimated right ventricular systolic pressure is 24.0 mmHg. Left Atrium: Left atrial size was mildly dilated. Right Atrium: Right atrial size was normal in size. Pericardium: There is no evidence of pericardial effusion. Mitral Valve: The mitral valve is normal in structure. No evidence of mitral valve regurgitation. No evidence of mitral valve stenosis. Tricuspid Valve: The tricuspid valve is normal in structure. Tricuspid valve regurgitation is trivial. No evidence of tricuspid stenosis. Aortic Valve: The aortic valve is normal in structure. Aortic valve regurgitation is not visualized. No aortic stenosis is present. Pulmonic Valve: The pulmonic valve was normal in structure. Pulmonic valve regurgitation is not visualized. No evidence of pulmonic stenosis.  Aorta: The aortic root is normal in size and structure. Venous: The inferior vena cava is dilated in size with greater than 50% respiratory variability, suggesting right atrial pressure of 8 mmHg. IAS/Shunts: No atrial level shunt detected by color flow Doppler.  LEFT VENTRICLE PLAX 2D LVIDd:         5.20 cm LVIDs:         4.20 cm LV PW:         1.10 cm LV IVS:        1.20 cm LVOT diam:     2.20 cm LVOT Area:     3.80 cm  IVC IVC diam: 2.40 cm LEFT ATRIUM           Index LA diam:      4.10 cm 2.03 cm/m LA Vol (A4C): 58.7 ml 29.06 ml/m   AORTA Ao Root diam: 3.30 cm TRICUSPID VALVE TR Peak grad:   21.0 mmHg TR Vmax:        229.00 cm/s  SHUNTS Systemic Diam: 2.20 cm Donato Schultz MD Electronically signed by Donato Schultz MD Signature Date/Time: 07/08/2022/5:56:32 PM    Final      Assessment and Plan:   Atrial flutter new for patient found during chemo. Hypotension from diltiazem and converted to NSR with IV amiodarone. Could switch to oral amiodarone(not sure best long term drug with COPD) on Eliquis. Can arrange OP f/u in Afib clinic. Will switch to oral amiodarone for now. 200 mg bid x 1 week then 200 mg daily and place 30 day  monitor.  LVEF 40-45% while in Afib.would repeat limited echo in NSR as an outpatient. LA mildly dilated. If EF still down will need ischemic w/u.  COPD  DM1(trig 313) on jardiance and lipitor  Stage 2 rectal cancer currently getting chemo with plans for radiation in Aug.   Risk Assessment/Risk Scores:        New York Heart Association (NYHA) Functional Class NYHA Class II  CHA2DS2-VASc Score = 4   This indicates a 4.8% annual risk of stroke. The patient's score is based upon: CHF History: 1 HTN History: 0 Diabetes History: 1 Stroke History: 0 Vascular Disease History: 0 Age Score: 2 Gender Score: 0         For questions or updates, please contact [facility] Please consult [email or web address] for contact info under    Signed, Jacolyn Reedy, PA-C  07/10/2022 8:33 AM

## 2022-07-10 NOTE — Telephone Encounter (Signed)
Instant msg request for Jacolyn Reedy, PA for a 30 day monitor for A-Flutter. Patient enrolled in [employer] and order placed.

## 2022-07-10 NOTE — Progress Notes (Signed)
SATURATION QUALIFICATIONS: (This note is used to comply with regulatory documentation for home oxygen)  Patient Saturations on Room Air at Rest = 95%  Patient Saturations on Room Air while Ambulating = 79%  Patient Saturations on 2 Liters of oxygen while Ambulating = 94%  Please briefly explain why patient needs home oxygen:

## 2022-07-18 NOTE — Progress Notes (Signed)
[facility] [address], [place] [address]    Clinic Day:  08/18/2022  Referring physician: [facility]  Patient Care Team: [place], [facility] as PCP - General Wendall Stade, MD as PCP - Cardiology (Cardiology) Marinus Maw, MD as PCP - Electrophysiology (Cardiology) Therese Sarah, RN as Oncology Nurse Navigator (Medical Oncology) Doreatha Massed, MD as Medical Oncologist (Medical Oncology)   ASSESSMENT & PLAN:   Assessment: 1.  Stage II (T3 N0 M0) low rectal adenocarcinoma, MMR preserved: - 22-month history of intermittent rectal bleeding.  No weight loss. - Colonoscopy (04/26/2022): Large cauliflower mass palpated in the distal rectum on DRE.  Scattered diverticula in the descending colon.  5 mm polyp in the ascending colon.  In the rectum, exophytic semilunar neoplastic appearing process beginning at the anal verge and extending proximally about 6 cm. - Pathology (04/26/2022): Invasive moderately differentiated adenocarcinoma of the rectal mass.  MMR preserved. - CT CAP (05/01/2022): Nodular wall thickening along the rectum.  No intrinsic abnormal lymph nodes.  No liver lesion.  Fatty liver.  Nodule along the left side of the urinary bladder.  Small mass along the course of the distal left ureter.  Worrisome for multifocal TCC.  Small lung nodules measuring up to 4 mm. - MRI pelvis (05/03/2022): 6.4 cm left lateral low rectal tumor abutting the internal anal sphincter, T3c N0.  Distance from tumor to the internal anal sphincter is 0 cm.  Tumor measures 6.4 cm in length and up to 2.2 cm in thickness. - PET scan (05/11/2022): No evidence of metastatic disease. - Met with Dr. Maisie Fus on 05/16/2022: She is in agreement with TNT and has discussed APR with colostomy. - TNT: FOLFOX cycle 1 started on 06/07/2022   2.  Social/family history: -He lives at home and is independent of ADLs and IADLs.  He is accompanied by his son today.  Worked at  [employer] prior to retirement.  Also served in [place] but denied any exposure to agent orange.  Quit smoking 1 year ago.  Smoked 1 to 2 packs/day for the last 61 years.  Started at age 28. - No family history of malignancies.    Plan: 1.  Stage II (T3 cN0 M0) low rectal adenocarcinoma, MMR preserved: - He received cycle 3 FOLFOX on 07/05/2022. - He was admitted to the hospital with atrial flutter and RVR on the day of pump discontinuation. - He has some dyspnea on exertion. - Reviewed labs today: Normal LFTs and electrolytes.  CBC grossly normal. - He will proceed with cycle 4 with 20% dose reduction.  RTC 2 weeks for follow-up.   2.  Left urinary bladder mass and distal left ureteral mass: - Office cystoscopy by Dr. Ronne Binning showed 3 cm left lateral wall bladder mass. - He will be scheduled for biopsy/TURBT in August.    No orders of the defined types were placed in this encounter.     I,Katie Daubenspeck,acting as a Neurosurgeon for Doreatha Massed, MD.,have documented all relevant documentation on the behalf of Doreatha Massed, MD,as directed by  Doreatha Massed, MD while in the presence of Doreatha Massed, MD.   I, Doreatha Massed MD, have reviewed the above documentation for accuracy and completeness, and I agree with the above.   Doreatha Massed, MD   7/12/20245:42 PM  CHIEF COMPLAINT:   Diagnosis: Rectal adenocarcinoma    Cancer Staging  Rectal adenocarcinoma [facility]) Staging form: Colon and Rectum, AJCC 8th  Edition - Clinical stage from 05/07/2022: Stage IIA (cT3, cN0, cM0) - Unsigned    Prior Therapy: none  Current Therapy:  Concurrent chemoradiation with FOLFOX, to be followed by surgery    HISTORY OF PRESENT ILLNESS:   Oncology History  Rectal adenocarcinoma (HCC)  05/07/2022 Initial Diagnosis   Rectal adenocarcinoma (HCC)   06/07/2022 -  Chemotherapy   Patient is on Treatment Plan : COLORECTAL FOLFOX q14d x 4 months         INTERVAL HISTORY:   Abner is a 77 y.o. male presenting to clinic today for follow up of Rectal adenocarcinoma. He was last seen by me on 07/05/22.  When he presented for pump disconnection on 07/07/22, he was found to have a heart rate of 158 upon arrival. Given persistent elevation after 15 minutes, he was referred to the ED and noted to have atrial flutter. He was admitted and treated with amiodarone infusion.  Today, he states that he is doing well overall. His appetite level is at 100%. His energy level is at 7 5%.  PAST MEDICAL HISTORY:   Past Medical History: Past Medical History:  Diagnosis Date   Back pain    COPD (chronic obstructive pulmonary disease) (HCC)    Diabetes mellitus without complication (HCC)    Hyperlipidemia     Surgical History: Past Surgical History:  Procedure Laterality Date   BIOPSY  04/26/2022   Procedure: BIOPSY;  Surgeon: Corbin Ade, MD;  [facility];  Service: Endoscopy;;   COLONOSCOPY WITH PROPOFOL N/A 04/26/2022   Procedure: COLONOSCOPY WITH PROPOFOL;  Surgeon: Corbin Ade, MD;  [facility];  Service: Endoscopy;  Laterality: N/A;  10:00am; ASA 3   hemorhoidectomy     IR IMAGING GUIDED PORT INSERTION  05/12/2022   POLYPECTOMY  04/26/2022   Procedure: POLYPECTOMY;  Surgeon: Corbin Ade, MD;  [facility];  Service: Endoscopy;;    Social History: Social History   Socioeconomic History   Marital status: Widowed    Spouse name: Not on file   Number of children: 2   Years of education: Not on file   Highest education level: Not on file  Occupational History   Not on file  Tobacco Use   Smoking status: Former    Current packs/day: 0.00    Types: Cigarettes    Start date: 08/1966    Quit date: 08/2016    Years since quitting: 6.0   Smokeless tobacco: Never  Vaping Use   Vaping status: Never Used  Substance and Sexual Activity   Alcohol use: Never   Drug use: Never   Sexual activity: Not  Currently  Other Topics Concern   Not on file  Social History Narrative   Not on file   Social Determinants of Health   Financial Resource Strain: Not on file  Food Insecurity: No Food Insecurity (07/07/2022)   Hunger Vital Sign    Worried About Running Out of Food in the Last Year: Never true    Ran Out of Food in the Last Year: Never true  Transportation Needs: No Transportation Needs (07/07/2022)   PRAPARE - Administrator, Civil Service (Medical): No    Lack of Transportation (Non-Medical): No  Physical Activity: Not on file  Stress: Not on file  Social Connections: Not on file  Intimate Partner Violence: Not At Risk (07/07/2022)   Humiliation, Afraid, Rape, and Kick questionnaire    Fear of Current or Ex-Partner: No  Emotionally Abused: No    Physically Abused: No    Sexually Abused: No    Family History: No family history on file.  Current Medications:  Current Outpatient Medications:    acidophilus (RISAQUAD) CAPS capsule, Take 1 capsule by mouth daily., Disp: , Rfl:    albuterol (VENTOLIN HFA) 108 (90 Base) MCG/ACT inhaler, Inhale 2 puffs into the lungs every 6 (six) hours as needed for wheezing or shortness of breath., Disp: , Rfl:    allopurinol (ZYLOPRIM) 300 MG tablet, Take 300 mg by mouth daily., Disp: , Rfl:    atorvastatin (LIPITOR) 20 MG tablet, Take 20 mg by mouth daily., Disp: , Rfl:    dextrose 5 % SOLN 1,000 mL with fluorouracil 5 GM/100ML SOLN, Inject into the vein., Disp: , Rfl:    JARDIANCE 10 MG TABS tablet, Take 25 mg by mouth daily., Disp: , Rfl:    LEUCOVORIN CALCIUM IV, Inject into the vein., Disp: , Rfl:    lidocaine-prilocaine (EMLA) cream, Apply 1 Application topically as needed. (Patient taking differently: Apply 1 Application topically as needed (PORT).), Disp: 30 g, Rfl: 0   loperamide (IMODIUM) 2 MG capsule, Take 2 mg by mouth 3 (three) times daily as needed for diarrhea or loose stools., Disp: , Rfl:    MELATONIN PO, Take 1  tablet by mouth at bedtime as needed (sleep)., Disp: , Rfl:    OXALIPLATIN IV, Inject into the vein., Disp: , Rfl:    prochlorperazine (COMPAZINE) 10 MG tablet, Take 1 tablet (10 mg total) by mouth every 6 (six) hours as needed for nausea or vomiting., Disp: 30 tablet, Rfl: 2   amiodarone (PACERONE) 200 MG tablet, Take 1 tablet (200 mg total) by mouth 2 (two) times daily., Disp: 60 tablet, Rfl: 1   fenofibrate 54 MG tablet, Take 54 mg by mouth daily., Disp: , Rfl:    Misc. Devices MISC, Please provide with shower chair, Disp: 1 Units, Rfl: 0   rivaroxaban (XARELTO) 20 MG TABS tablet, Take 1 tablet (20 mg total) by mouth daily with supper., Disp: 30 tablet, Rfl: 1   Allergies: No Known Allergies  REVIEW OF SYSTEMS:   Review of Systems  Constitutional:  Negative for chills, fatigue and fever.  HENT:   Negative for lump/mass, mouth sores, nosebleeds, sore throat and trouble swallowing.   Eyes:  Negative for eye problems.  Respiratory:  Positive for shortness of breath. Negative for cough.   Cardiovascular:  Negative for chest pain, leg swelling and palpitations.  Gastrointestinal:  Negative for abdominal pain, constipation, diarrhea, nausea and vomiting.  Genitourinary:  Negative for bladder incontinence, difficulty urinating, dysuria, frequency, hematuria and nocturia.   Musculoskeletal:  Negative for arthralgias, back pain, flank pain, myalgias and neck pain.  Skin:  Negative for itching and rash.  Neurological:  Negative for dizziness, headaches and numbness.  Hematological:  Does not bruise/bleed easily.  Psychiatric/Behavioral:  Negative for depression, sleep disturbance and suicidal ideas. The patient is not nervous/anxious.   All other systems reviewed and are negative.    VITALS:   There were no vitals taken for this visit.  Wt Readings from Last 3 Encounters:  08/09/22 178 lb 3.2 oz (80.8 kg)  08/07/22 167 lb (75.8 kg)  08/04/22 162 lb 7.7 oz (73.7 kg)    There is no height  or weight on file to calculate BMI.  Performance status (ECOG): 1 - Symptomatic but completely ambulatory  PHYSICAL EXAM:   Physical Exam Vitals and nursing note reviewed. Exam  conducted with a chaperone present.  Constitutional:      Appearance: Normal appearance.  Cardiovascular:     Rate and Rhythm: Normal rate and regular rhythm.     Pulses: Normal pulses.     Heart sounds: Normal heart sounds.  Pulmonary:     Effort: Pulmonary effort is normal.     Breath sounds: Normal breath sounds.  Abdominal:     Palpations: Abdomen is soft. There is no hepatomegaly, splenomegaly or mass.     Tenderness: There is no abdominal tenderness.  Musculoskeletal:     Right lower leg: No edema.     Left lower leg: No edema.  Lymphadenopathy:     Cervical: No cervical adenopathy.     Right cervical: No superficial, deep or posterior cervical adenopathy.    Left cervical: No superficial, deep or posterior cervical adenopathy.     Upper Body:     Right upper body: No supraclavicular or axillary adenopathy.     Left upper body: No supraclavicular or axillary adenopathy.  Neurological:     General: No focal deficit present.     Mental Status: He is alert and oriented to person, place, and time.  Psychiatric:        Mood and Affect: Mood normal.        Behavior: Behavior normal.     LABS:      Latest Ref Rng & Units 08/11/2022   12:44 PM 08/07/2022    7:45 AM 08/02/2022    8:02 AM  CBC  WBC 4.0 - 10.5 K/uL 7.0  7.1  11.1   Hemoglobin 13.0 - 17.0 g/dL 16.1  09.6  04.5   Hematocrit 39.0 - 52.0 % 44.7  45.2  47.6   Platelets 150 - 400 K/uL 196  201  162       Latest Ref Rng & Units 08/11/2022   12:44 PM 08/07/2022    7:45 AM 08/03/2022    4:29 AM  CMP  Glucose 70 - 99 mg/dL 409  811  914   BUN 8 - 23 mg/dL 17  13  22    Creatinine 0.61 - 1.24 mg/dL 7.82  9.56  2.13   Sodium 135 - 145 mmol/L 137  138  137   Potassium 3.5 - 5.1 mmol/L 4.1  4.3  4.3   Chloride 98 - 111 mmol/L 101  102  104    CO2 22 - 32 mmol/L 26  26  22    Calcium 8.9 - 10.3 mg/dL 8.6  8.7  8.9   Total Protein 6.5 - 8.1 g/dL  7.1    Total Bilirubin 0.3 - 1.2 mg/dL  0.7    Alkaline Phos 38 - 126 U/L  68    AST 15 - 41 U/L  14    ALT 0 - 44 U/L  16       Lab Results  Component Value Date   CEA1 2.6 04/26/2022   /  CEA  Date Value Ref Range Status  04/26/2022 2.6 0.0 - 4.7 ng/mL Final    Comment:    (NOTE)                             Nonsmokers          <3.9                             Smokers             <  5.6 Roche [employer]ostics Electrochemiluminescence Immunoassay (ECLIA) Values obtained with different assay methods or kits cannot be used interchangeably.  Results cannot be interpreted as absolute evidence of the presence or absence of malignant disease. Performed At: [facility] [address] [place], [place] [id number] Jolene Schimke MD NW:[id number]    No results found for: "PSA1" No results found for: "[id number]" No results found for: "CAN125"  No results found for: "TOTALPROTELP", "ALBUMINELP", "A1GS", "A2GS", "BETS", "BETA2SER", "GAMS", "MSPIKE", "SPEI" No results found for: "TIBC", "FERRITIN", "IRONPCTSAT" No results found for: "LDH"   STUDIES:   CT Angio Chest PE W and/or Wo Contrast  Result Date: 08/02/2022 CLINICAL DATA:  77 year old male with clinical suspicion of pulmonary embolism. History of rectal carcinoma. * Tracking Code: BO * EXAM: CT ANGIOGRAPHY CHEST WITH CONTRAST TECHNIQUE: Multidetector CT imaging of the chest was performed using the standard protocol during bolus administration of intravenous contrast. Multiplanar CT image reconstructions and MIPs were obtained to evaluate the vascular anatomy. RADIATION DOSE REDUCTION: This exam was performed according to the departmental dose-optimization program which includes automated exposure control, adjustment of the mA and/or kV according to patient size and/or use of iterative reconstruction technique. CONTRAST:  75mL  OMNIPAQUE IOHEXOL 350 MG/ML SOLN COMPARISON:  PET-CT 05/11/2022. FINDINGS: Cardiovascular: No filling defects are noted in the pulmonary arterial tree to suggest pulmonary embolism. Heart size is normal. There is no significant pericardial fluid, thickening or pericardial calcification. There is aortic atherosclerosis, as well as atherosclerosis of the great vessels of the mediastinum and the coronary arteries, including calcified atherosclerotic plaque in the left main, left anterior descending and left circumflex coronary arteries. Right internal jugular single-lumen Port-A-Cath with tip terminating at the superior cavoatrial junction. Mediastinum/Nodes: No pathologically enlarged mediastinal or hilar lymph nodes. Esophagus is unremarkable in appearance. No axillary lymphadenopathy. Lungs/Pleura: No acute consolidative airspace disease. No pleural effusions. No definite suspicious appearing pulmonary nodules or masses are noted. Diffuse bronchial wall thickening with moderate centrilobular and paraseptal emphysema. Azygous lobe (normal anatomical variant) incidentally noted. Upper Abdomen: Unremarkable. Musculoskeletal: There are no aggressive appearing lytic or blastic lesions noted in the visualized portions of the skeleton. Review of the MIP images confirms the above findings. IMPRESSION: 1. No acute findings in the thorax. Specifically, no evidence of pulmonary embolism. 2. No evidence of metastatic disease to the thorax. 3. Diffuse bronchial wall thickening with moderate centrilobular and paraseptal emphysema; imaging findings suggestive of underlying COPD. 4. Aortic atherosclerosis, in addition to left main and 2 vessel coronary artery disease. Assessment for potential risk factor modification, dietary therapy or pharmacologic therapy may be warranted, if clinically indicated. Aortic Atherosclerosis (ICD10-I70.0) and Emphysema (ICD10-J43.9). Electronically Signed   By: Trudie Reed M.D.   On: 08/02/2022  12:22   DG Chest Portable 1 View  Result Date: 08/02/2022 CLINICAL DATA:  Palpitations.  Tachycardia. EXAM: PORTABLE CHEST 1 VIEW COMPARISON:  10/03/2005 FINDINGS: Power port in place with the tip in the SVC above the right atrium. Heart size is normal. Mediastinal shadows are normal. The lungs are clear. No edema, infiltrate, collapse or effusion. IMPRESSION: No active disease. Power port in place. Electronically Signed   By: Paulina Fusi M.D.   On: 08/02/2022 11:02

## 2022-07-19 ENCOUNTER — Inpatient Hospital Stay (HOSPITAL_BASED_OUTPATIENT_CLINIC_OR_DEPARTMENT_OTHER): Payer: PPO | Admitting: Hematology

## 2022-07-19 ENCOUNTER — Inpatient Hospital Stay: Payer: PPO | Attending: Hematology

## 2022-07-19 ENCOUNTER — Encounter: Payer: Self-pay | Admitting: Hematology

## 2022-07-19 ENCOUNTER — Inpatient Hospital Stay: Payer: PPO

## 2022-07-19 VITALS — BP 117/57 | HR 75 | Temp 98.5°F | Resp 18 | Wt 174.6 lb

## 2022-07-19 VITALS — BP 102/66 | HR 68 | Temp 98.4°F | Resp 18

## 2022-07-19 DIAGNOSIS — Z452 Encounter for adjustment and management of vascular access device: Secondary | ICD-10-CM | POA: Diagnosis not present

## 2022-07-19 DIAGNOSIS — Z5111 Encounter for antineoplastic chemotherapy: Secondary | ICD-10-CM | POA: Insufficient documentation

## 2022-07-19 DIAGNOSIS — Z87891 Personal history of nicotine dependence: Secondary | ICD-10-CM | POA: Diagnosis not present

## 2022-07-19 DIAGNOSIS — C20 Malignant neoplasm of rectum: Secondary | ICD-10-CM

## 2022-07-19 DIAGNOSIS — Z95828 Presence of other vascular implants and grafts: Secondary | ICD-10-CM

## 2022-07-19 LAB — COMPREHENSIVE METABOLIC PANEL
ALT: 24 U/L (ref 0–44)
AST: 21 U/L (ref 15–41)
Albumin: 4 g/dL (ref 3.5–5.0)
Alkaline Phosphatase: 68 U/L (ref 38–126)
Anion gap: 12 (ref 5–15)
BUN: 31 mg/dL — ABNORMAL HIGH (ref 8–23)
CO2: 24 mmol/L (ref 22–32)
Calcium: 8.8 mg/dL — ABNORMAL LOW (ref 8.9–10.3)
Chloride: 102 mmol/L (ref 98–111)
Creatinine, Ser: 1.19 mg/dL (ref 0.61–1.24)
GFR, Estimated: >60 mL/min (ref >60)
Glucose, Bld: 167 mg/dL — ABNORMAL HIGH (ref 70–99)
Potassium: 3.8 mmol/L (ref 3.5–5.1)
Sodium: 138 mmol/L (ref 135–145)
Total Bilirubin: 0.6 mg/dL (ref 0.3–1.2)
Total Protein: 7.5 g/dL (ref 6.5–8.1)

## 2022-07-19 LAB — CBC WITH DIFFERENTIAL/PLATELET
Abs Immature Granulocytes: 0.03 10*3/uL (ref 0.00–0.07)
Basophils Absolute: 0 10*3/uL (ref 0.0–0.1)
Basophils Relative: 0 %
Eosinophils Absolute: 0.1 10*3/uL (ref 0.0–0.5)
Eosinophils Relative: 1 %
HCT: 46.2 % (ref 39.0–52.0)
Hemoglobin: 15.2 g/dL (ref 13.0–17.0)
Immature Granulocytes: 0 %
Lymphocytes Relative: 44 %
Lymphs Abs: 3 10*3/uL (ref 0.7–4.0)
MCH: 30.8 pg (ref 26.0–34.0)
MCHC: 32.9 g/dL (ref 30.0–36.0)
MCV: 93.7 fL (ref 80.0–100.0)
Monocytes Absolute: 0.9 10*3/uL (ref 0.1–1.0)
Monocytes Relative: 13 %
Neutro Abs: 2.9 10*3/uL (ref 1.7–7.7)
Neutrophils Relative %: 42 %
Platelets: 201 10*3/uL (ref 150–400)
RBC: 4.93 MIL/uL (ref 4.22–5.81)
RDW: 14.4 % (ref 11.5–15.5)
WBC: 7 10*3/uL (ref 4.0–10.5)
nRBC: 0 % (ref 0.0–0.2)

## 2022-07-19 LAB — MAGNESIUM: Magnesium: 2.3 mg/dL (ref 1.7–2.4)

## 2022-07-19 MED ORDER — PALONOSETRON HCL INJECTION 0.25 MG/5ML
0.2500 mg | Freq: Once | INTRAVENOUS | Status: AC
  Administered 2022-07-19: 0.25 mg via INTRAVENOUS
  Filled 2022-07-19: qty 5

## 2022-07-19 MED ORDER — SODIUM CHLORIDE 0.9 % IV SOLN
10.0000 mg | Freq: Once | INTRAVENOUS | Status: AC
  Administered 2022-07-19: 10 mg via INTRAVENOUS
  Filled 2022-07-19: qty 1

## 2022-07-19 MED ORDER — LEUCOVORIN CALCIUM INJECTION 350 MG
320.0000 mg/m2 | Freq: Once | INTRAVENOUS | Status: AC
  Administered 2022-07-19: 640 mg via INTRAVENOUS
  Filled 2022-07-19: qty 32

## 2022-07-19 MED ORDER — DEXTROSE 5 % IV SOLN: Freq: Once | INTRAVENOUS | Status: AC

## 2022-07-19 MED ORDER — FLUOROURACIL CHEMO INJECTION 2.5 GM/50ML
320.0000 mg/m2 | Freq: Once | INTRAVENOUS | Status: AC
  Administered 2022-07-19: 650 mg via INTRAVENOUS
  Filled 2022-07-19: qty 13

## 2022-07-19 MED ORDER — OXALIPLATIN CHEMO INJECTION 100 MG/20ML
68.0000 mg/m2 | Freq: Once | INTRAVENOUS | Status: AC
  Administered 2022-07-19: 150 mg via INTRAVENOUS
  Filled 2022-07-19: qty 20

## 2022-07-19 MED ORDER — SODIUM CHLORIDE 0.9% FLUSH
10.0000 mL | Freq: Once | INTRAVENOUS | Status: AC
  Administered 2022-07-19: 10 mL via INTRAVENOUS

## 2022-07-19 MED ORDER — SODIUM CHLORIDE 0.9 % IV SOLN
1920.0000 mg/m2 | INTRAVENOUS | Status: AC
  Administered 2022-07-19: 3500 mg via INTRAVENOUS
  Filled 2022-07-19: qty 70

## 2022-07-19 NOTE — Progress Notes (Signed)
Patient has been assessed, vital signs and labs have been reviewed by Dr. Ellin Saba. ANC, Creatinine, LFTs, and Platelets are within treatment parameters per Dr. Ellin Saba. The patient is good to proceed with treatment at 80% at this time. Primary RN and pharmacy aware.

## 2022-07-19 NOTE — Patient Instructions (Signed)
[facility]  Discharge Instructions  You were seen and examined today by Dr. Katragadda.  Dr. Katragadda discussed your most recent lab work which is stable.   Proceed with treatment today as planned.  Follow-up as scheduled.  Thank you for choosing [facility] to provide your oncology and hematology care.   To afford each patient quality time with our provider, please arrive at least 15 minutes before your scheduled appointment time. You may need to reschedule your appointment if you arrive late (10 or more minutes). Arriving late affects you and other patients whose appointments are after yours.  Also, if you miss three or more appointments without notifying the office, you may be dismissed from the clinic at the provider's discretion.    Again, thank you for choosing [facility].  Our hope is that these requests will decrease the amount of time that you wait before being seen by our physicians.   If you have a lab appointment with the Cancer Center - please note that after April 8th, all labs will be drawn in the cancer center.  You do not have to check in or register with the main entrance as you have in the past but will complete your check-in at the cancer center.            _____________________________________________________________  Should you have questions after your visit to [facility], please contact our office at [phone] and follow the prompts.  Our office hours are 8:00 a.m. to 4:30 p.m. Monday - Thursday and 8:00 a.m. to 2:30 p.m. Friday.  Please note that voicemails left after 4:00 p.m. may not be returned until the following business day.  We are closed weekends and all major holidays.  You do have access to a nurse 24-7, just call the main number to the clinic [phone] and do not press any options, hold on the line and a nurse will answer the phone.    For prescription refill  requests, have your pharmacy contact our office and allow 72 hours.    Masks are no longer required in the cancer centers. If you would like for your care team to wear a mask while they are taking care of you, please let them know. You may have one support person who is at least 76 years old accompany you for your appointments.  

## 2022-07-19 NOTE — Progress Notes (Signed)
Patient tolerated chemotherapy with no complaints voiced.  Side effects with management reviewed with understanding verbalized.  Port site clean and dry with no bruising or swelling noted at site.  Good blood return noted before and after administration of chemotherapy.  Chemotherapy pump connected with no alarms noted.   Patient left in satisfactory condition with VSS and no s/s of distress noted.  

## 2022-07-19 NOTE — Patient Instructions (Signed)
[facility]  Discharge Instructions: Thank you for choosing [facility] to provide your oncology and hematology care.  If you have a lab appointment with the Cancer Center - please note that after April 8th, 2024, all labs will be drawn in the cancer center.  You do not have to check in or register with the main entrance as you have in the past but will complete your check-in in the cancer center.  Wear comfortable clothing and clothing appropriate for easy access to any Portacath or PICC line.   We strive to give you quality time with your provider. You may need to reschedule your appointment if you arrive late (15 or more minutes).  Arriving late affects you and other patients whose appointments are after yours.  Also, if you miss three or more appointments without notifying the office, you may be dismissed from the clinic at the provider's discretion.      For prescription refill requests, have your pharmacy contact our office and allow 72 hours for refills to be completed.    Today you received the following chemotherapy and/or immunotherapy agents oxaliplatin, leucovorin, adruicil.       To help prevent nausea and vomiting after your treatment, we encourage you to take your nausea medication as directed.  BELOW ARE SYMPTOMS THAT SHOULD BE REPORTED IMMEDIATELY: *FEVER GREATER THAN 100.4 F (38 C) OR HIGHER *CHILLS OR SWEATING *NAUSEA AND VOMITING THAT IS NOT CONTROLLED WITH YOUR NAUSEA MEDICATION *UNUSUAL SHORTNESS OF BREATH *UNUSUAL BRUISING OR BLEEDING *URINARY PROBLEMS (pain or burning when urinating, or frequent urination) *BOWEL PROBLEMS (unusual diarrhea, constipation, pain near the anus) TENDERNESS IN MOUTH AND THROAT WITH OR WITHOUT PRESENCE OF ULCERS (sore throat, sores in mouth, or a toothache) UNUSUAL RASH, SWELLING OR PAIN  UNUSUAL VAGINAL DISCHARGE OR ITCHING   Items with * indicate a potential emergency and should be followed up as soon  as possible or go to the Emergency Department if any problems should occur.  Please show the CHEMOTHERAPY ALERT CARD or IMMUNOTHERAPY ALERT CARD at check-in to the Emergency Department and triage nurse.  Should you have questions after your visit or need to cancel or reschedule your appointment, please contact [facility] [phone]  and follow the prompts.  Office hours are 8:00 a.m. to 4:30 p.m. Monday - Friday. Please note that voicemails left after 4:00 p.m. may not be returned until the following business day.  We are closed weekends and major holidays. You have access to a nurse at all times for urgent questions. Please call the main number to the clinic [phone] and follow the prompts.  For any non-urgent questions, you may also contact your provider using MyChart. We now offer e-Visits for anyone 35 and older to request care online for non-urgent symptoms. For details visit [email or web address].   Also download the MyChart app! Go to the app store, search "MyChart", open the app, select [place], and log in with your MyChart username and password.

## 2022-07-21 ENCOUNTER — Encounter: Payer: Self-pay | Admitting: Hematology

## 2022-07-21 ENCOUNTER — Inpatient Hospital Stay: Payer: PPO

## 2022-07-21 VITALS — BP 103/72 | HR 145 | Temp 97.7°F | Resp 18

## 2022-07-21 DIAGNOSIS — C20 Malignant neoplasm of rectum: Secondary | ICD-10-CM

## 2022-07-21 DIAGNOSIS — Z5111 Encounter for antineoplastic chemotherapy: Secondary | ICD-10-CM | POA: Diagnosis not present

## 2022-07-21 MED ORDER — HEPARIN SOD (PORK) LOCK FLUSH 100 UNIT/ML IV SOLN
500.0000 [IU] | Freq: Once | INTRAVENOUS | Status: AC | PRN
  Administered 2022-07-21: 500 [IU]

## 2022-07-21 MED ORDER — SODIUM CHLORIDE 0.9% FLUSH
10.0000 mL | INTRAVENOUS | Status: DC | PRN
  Administered 2022-07-21: 10 mL

## 2022-07-21 NOTE — Progress Notes (Signed)
Patient in clinic for pump D/C, noted to have heart rate 150.  Suspect that he is back in rapid atrial flutter versus SVT.  He was advised to present to the emergency department, but refused.  Spoke with [facility]ology RN at [facility] [place], [place] who was able to schedule appointment for new patient visit with provider on Monday, 07/24/2022 at 3:00 PM.  Patient acknowledges understanding of risks of deferring emergency evaluation at this time.  He is strongly advised to go immediately to the emergency department if he develops any shortness of breath, chest pain, or presyncopal feeling.  Rojelio Brenner PA-C 07/21/2022 2:56 PM

## 2022-07-21 NOTE — Progress Notes (Signed)
Patient here to have 5FU pump disconnected. Patient's HR noted to be 145 upon arrival , patient seen last Friday with elevated HR and sent to ED. Patient admitted and treated for Atrial flutter and SVT. Patient denies any chest pain, dizziness, or shortness of breath. Patient reports being placed on medication by cardiologist and took all medications today. Rojelio Brenner PA made aware of the situation and consulted with cardiologist. Rojelio Brenner in room to assess patient and talk to family and patient.   Pump infusion complete. Patient's 5FU pump disconnected with no problems. Port flushed without difficulty.  Good blood return noted with no bruising or swelling noted at site.  Band aid applied.  VSS with exception of HR 145, refer to [employer] PA's note no s/s of distress noted.

## 2022-07-21 NOTE — Patient Instructions (Signed)
You have a new patient appointment at the [facility] in [place] on Monday 07/24/2022 at 3:00 PM.     Since you have not chosen not to go to the emergency department at this time, please keep a close watch for any symptoms this weekend.  If you develop any shortness of breath, chest pain, or feeling like you may pass out, you need to go IMMEDIATELY to the emergency department.  We were able to speak with cardiology office and they have scheduled you for an appointment at the [facility] in [place] on Monday, 07/24/2022 at 3:00 PM.

## 2022-07-24 ENCOUNTER — Encounter (HOSPITAL_COMMUNITY): Payer: Self-pay | Admitting: Internal Medicine

## 2022-07-24 ENCOUNTER — Ambulatory Visit (HOSPITAL_COMMUNITY)
Admission: RE | Admit: 2022-07-24 | Discharge: 2022-07-24 | Disposition: A | Discharge status: Home / Self Care | Payer: PPO | Source: Ambulatory Visit | Attending: Internal Medicine | Admitting: Internal Medicine

## 2022-07-24 VITALS — BP 118/64 | HR 76 | Ht 71.0 in | Wt 172.2 lb

## 2022-07-24 DIAGNOSIS — E119 Type 2 diabetes mellitus without complications: Secondary | ICD-10-CM | POA: Diagnosis not present

## 2022-07-24 DIAGNOSIS — Z79899 Other long term (current) drug therapy: Secondary | ICD-10-CM | POA: Diagnosis not present

## 2022-07-24 DIAGNOSIS — I4892 Unspecified atrial flutter: Secondary | ICD-10-CM | POA: Diagnosis present

## 2022-07-24 DIAGNOSIS — I4891 Unspecified atrial fibrillation: Secondary | ICD-10-CM

## 2022-07-24 DIAGNOSIS — J449 Chronic obstructive pulmonary disease, unspecified: Secondary | ICD-10-CM | POA: Diagnosis not present

## 2022-07-24 DIAGNOSIS — Z7901 Long term (current) use of anticoagulants: Secondary | ICD-10-CM | POA: Insufficient documentation

## 2022-07-24 DIAGNOSIS — D6869 Other thrombophilia: Secondary | ICD-10-CM | POA: Diagnosis not present

## 2022-07-24 DIAGNOSIS — E785 Hyperlipidemia, unspecified: Secondary | ICD-10-CM | POA: Insufficient documentation

## 2022-07-24 NOTE — Progress Notes (Signed)
Primary Care Physician: Ponciano Ort [name] Primary Cardiologist: Charlton Haws, MD Electrophysiologist: None     Referring Physician: Dr. Arturo Morton is a 77 y.o. male with a history of rectal adenocarcinoma, COPD, diabetes, HLD, and atrial flutter. Recently admitted 5/31-07/10/22. HR 150s noted in cancer center went to ED on 5/31 thought to have SVT given adenosine and noted to be in atrial flutter. Discharged on amiodarone and Eliquis. Patient is on Eliquis 5 mg BID for a CHADS2VASC score of 3.             On evaluation today, he is currently in NSR. He has noticed with chemotherapy treatments when he returns the equipment at the end of the week they will tell him his HR is above 100 bpm. He has no cardiac awareness of symptoms. His chemotherapy is supposed to continue until the end of August and then transition to radiation treatment.   Today, he denies symptoms of palpitations, chest pain, shortness of breath, orthopnea, PND, lower extremity edema, dizziness, presyncope, syncope, snoring, daytime somnolence, bleeding, or neurologic sequela. The patient is tolerating medications without difficulties and is otherwise without complaint today.    he has a BMI of Body mass index is 24.02 kg/m.Marland Kitchen Filed Weights   07/24/22 1453  Weight: 78.1 kg     Atrial Fibrillation Management history:  Previous antiarrhythmic drugs: amiodarone  Previous cardioversions: None Previous ablations: None Anticoagulation history: Eliquis 5 mg BID  ROS- All systems are reviewed and negative except as per the HPI above.  Physical Exam: BP 118/64   Pulse 76   Ht 5\' 11"  (1.803 m)   Wt 78.1 kg   BMI 24.02 kg/m   GEN: Well nourished, well developed in no acute distress NECK: No JVD; No carotid bruits CARDIAC: Regular rate and rhythm, no murmurs, rubs, gallops RESPIRATORY:  Clear to auscultation without rales, wheezing or rhonchi  ABDOMEN: Soft, non-tender,  non-distended EXTREMITIES:  No edema; No deformity   EKG today demonstrates  Vent. rate 76 BPM PR interval 124 ms QRS duration 94 ms QT/QTcB 386/434 ms P-R-T axes 42 78 61 Normal sinus rhythm Normal ECG When compared with ECG of 07-Jul-2022 19:18, PREVIOUS ECG IS PRESENT  Echo 07/08/22 demonstrated  1. Afib. Left ventricular ejection fraction, by estimation, is 40 to 45%.  The left ventricle has mildly decreased function. The left ventricle  demonstrates global hypokinesis. There is mild left ventricular  hypertrophy. Left ventricular diastolic  parameters are indeterminate.   2. Right ventricular systolic function is normal. The right ventricular  size is normal. There is normal pulmonary artery systolic pressure. The  estimated right ventricular systolic pressure is 24.0 mmHg.   3. Left atrial size was mildly dilated.   4. The mitral valve is normal in structure. No evidence of mitral valve  regurgitation. No evidence of mitral stenosis.   5. The aortic valve is normal in structure. Aortic valve regurgitation is  not visualized. No aortic stenosis is present.   6. The inferior vena cava is dilated in size with >50% respiratory  variability, suggesting right atrial pressure of 8 mmHg.    ASSESSMENT & PLAN CHA2DS2-VASc Score = 4  The patient's score is based upon: CHF History: 1 HTN History: 0 Diabetes History: 1 Stroke History: 0 Vascular Disease History: 0 Age Score: 2 Gender Score: 0       ASSESSMENT AND PLAN:  Paroxysmal Atrial Flutter (ICD10:  I48.0) The patient's CHA2DS2-VASc score is  4, indicating a 4.8% annual risk of stroke.    He is currently in NSR. He has not placed cardiac monitor yet. Will discuss case with EP as currently with history of COPD the choice of amiodarone may not be best for long term solution. Currently, it seems reasonable to continue given ongoing chemotherapy therapy. Will discuss with EP whether to continue amiodarone through remainder  of all oncology treatment vs. Different treatment approach.   Son with patient today admits they have not put cardiac monitor on him yet. Will do so when they get home.  Continue amiodarone 200 mg daily.  Secondary Hypercoagulable State (ICD10:  D68.69) The patient is at significant risk for stroke/thromboembolism based upon his CHA2DS2-VASc Score of 4.  Continue Apixaban (Eliquis).   No missed doses.     Follow up 6 weeks to discuss monitor results.    Lake Bells, PA-C  [facility] [address] [place], [place] [address] [phone]

## 2022-07-27 ENCOUNTER — Telehealth (HOSPITAL_COMMUNITY): Payer: Self-pay | Admitting: Internal Medicine

## 2022-07-27 ENCOUNTER — Other Ambulatory Visit (HOSPITAL_COMMUNITY): Payer: Self-pay | Admitting: *Deleted

## 2022-07-27 DIAGNOSIS — I483 Typical atrial flutter: Secondary | ICD-10-CM

## 2022-07-27 NOTE — Telephone Encounter (Signed)
James Sellers, RN, called and spoke with both patient and son regarding separate conversations between myself and Dr. Elberta Fortis and oncologist Dr. Ellin Saba pertaining to below history. After discussion, patient is agreeable to consult with Dr. Ladona Ridgel to discuss atrial flutter ablation. He is to remain on current medication regimen without change.   Original message:  This patient has COPD and rectal adenocarcinoma currently getting chemotherapy and taking amiodarone for rapid atrial flutter that seems to come on with the chemo infusion he gets. Can you please call patient, as during our office visit I said I would try and get a plan of care going between physicians on what to do since he is currently receiving chemotherapy treatment complicated by rapid atrial flutter post infusion. He is on amiodarone daily but seems to still be having breakthrough flutter. Due to interactions between our anti-arrhythmic medication and chemotherapy medications, I asked my supervising physician Dr. Elberta Fortis about a plan of care for this patient. He reviewed the information and abnormal ECG and recommended I send [name] to Dr. Lewayne Bunting; he is one of our other EP physicians that performs atrial flutter ablations. It might be quicker to get to see him than the other physicians. I sent this information to his oncologist Dr. Ellin Saba and made her aware of this plan and she agreed. Please place referral to Dr. Ladona Ridgel to discuss typical atrial flutter ablation as soon as possible.

## 2022-08-01 NOTE — Progress Notes (Signed)
[facility] [address], [place] [address]    Clinic Day:  08/02/2022  Referring physician: Ponciano [name]  Patient Care Team: [facility], [facility] as PCP - General James Stade, MD as PCP - Cardiology (Cardiology) James Sarah, RN as Oncology Nurse Navigator (Medical Oncology) James Massed, MD as Medical Oncologist (Medical Oncology)   ASSESSMENT & PLAN:   Assessment: 1.  Stage II (T3 N0 M0) low rectal adenocarcinoma, MMR preserved: - 30-month history of intermittent rectal bleeding.  No weight loss. - Colonoscopy (04/26/2022): Large cauliflower mass palpated in the distal rectum on DRE.  Scattered diverticula in the descending colon.  5 mm polyp in the ascending colon.  In the rectum, exophytic semilunar neoplastic appearing process beginning at the anal verge and extending proximally about 6 cm. - Pathology (04/26/2022): Invasive moderately differentiated adenocarcinoma of the rectal mass.  MMR preserved. - CT CAP (05/01/2022): Nodular wall thickening along the rectum.  No intrinsic abnormal lymph nodes.  No liver lesion.  Fatty liver.  Nodule along the left side of the urinary bladder.  Small mass along the course of the distal left ureter.  Worrisome for multifocal TCC.  Small lung nodules measuring up to 4 mm. - MRI pelvis (05/03/2022): 6.4 cm left lateral low rectal tumor abutting the internal anal sphincter, T3c N0.  Distance from tumor to the internal anal sphincter is 0 cm.  Tumor measures 6.4 cm in length and up to 2.2 cm in thickness. - PET scan (05/11/2022): No evidence of metastatic disease. - Met with Dr. Maisie [facility] on 05/16/2022: She is in agreement with TNT and has discussed APR with colostomy. - TNT: FOLFOX cycle 1 started on 06/07/2022   2.  Social/family history: -He lives at home and is independent of ADLs and IADLs.  He is accompanied by his son today.  Worked at [employer] prior to retirement.  Also served in  [place] but denied any exposure to agent orange.  Quit smoking 1 year ago.  Smoked 1 to 2 packs/day for the last 61 years.  Started at age 64. - No family history of malignancies.    Plan: 1.  Stage II (T3 cN0 M0) low rectal adenocarcinoma, MMR preserved: - He received cycle 4 on 07/19/2022.  Prior to that he was admitted to the hospital from 07/07/2022 through 07/10/2022 with a flutter and RVR. - Today he reported shortness of breath since morning.  He was found to have elevated heart rate of 145 in our office with blood pressure of 98/81. - After last treatment he denied any numbness or tingling in the extremities.  No GI symptoms noted. - Reviewed labs today: Normal LFTs and electrolytes.  Creatinine is normal.  CBC was grossly normal. - I would hold his treatment today. - I have recommended him to be evaluated in the [facility] due to tachycardia.  I have called the [facility] provider and inform them of his arrival.   2.  Left urinary bladder mass and distal left ureteral mass: - Office cystoscopy by Dr. Ronne [facility] showed 3 cm left lateral wall bladder mass. - He will be scheduled for TURBT/biopsy after he completes chemotherapy.    No orders of the defined types were placed in this encounter.     I,[facility],acting as a Neurosurgeon for James Massed, MD.,have documented all relevant documentation on the behalf of James Massed, MD,as directed by  James Massed, MD while in the presence of James Massed, MD.  I, James Massed MD, have reviewed the above documentation for accuracy and completeness, and I agree with the above.   James Massed, MD   6/26/20244:34 PM  CHIEF COMPLAINT:   Diagnosis: Rectal adenocarcinoma    Cancer Staging  Rectal adenocarcinoma [facility]) Staging form: Colon and Rectum, AJCC 8th Edition - Clinical stage from 05/07/2022: Stage IIA (cT3, cN0, cM0) - Unsigned    Prior Therapy: none  Current Therapy:  Concurrent chemoradiation with  FOLFOX, to be followed by surgery    HISTORY OF PRESENT ILLNESS:   Oncology History  Rectal adenocarcinoma (HCC)  05/07/2022 Initial Diagnosis   Rectal adenocarcinoma (HCC)   06/07/2022 -  Chemotherapy   Patient is on Treatment Plan : COLORECTAL FOLFOX q14d x 4 months        INTERVAL HISTORY:   [facility] is a 77 y.o. male presenting to clinic today for follow up of Rectal adenocarcinoma. He was last seen by me on 07/19/22.  Today, he states that he is doing well overall. His appetite level is at 75%. His energy level is at 25%.  PAST MEDICAL HISTORY:   Past Medical History: Past Medical History:  Diagnosis Date   Back pain    COPD (chronic obstructive pulmonary disease) (HCC)    Diabetes mellitus without complication (HCC)    Hyperlipidemia     Surgical History: Past Surgical History:  Procedure Laterality Date   BIOPSY  04/26/2022   Procedure: BIOPSY;  Surgeon: James Ade, MD;  [facility];  Service: Endoscopy;;   COLONOSCOPY WITH PROPOFOL N/A 04/26/2022   Procedure: COLONOSCOPY WITH PROPOFOL;  Surgeon: James Ade, MD;  Location[facility];  Service: Endoscopy;  Laterality: N/A;  10:00am; ASA 3   hemorhoidectomy     IR IMAGING GUIDED PORT INSERTION  05/12/2022   POLYPECTOMY  04/26/2022   Procedure: POLYPECTOMY;  Surgeon: James Ade, MD;  Location: [facility];  Service: Endoscopy;;    Social History: Social History   Socioeconomic History   Marital status: Widowed    Spouse name: Not on file   Number of children: 2   Years of education: Not on file   Highest education level: Not on file  Occupational History   Not on file  Tobacco Use   Smoking status: Former    Years: 50    Types: Cigarettes    Quit date: 08/2016    Years since quitting: 5.9   Smokeless tobacco: Never  Vaping Use   Vaping Use: Never used  Substance and Sexual Activity   Alcohol use: Never   Drug use: Never   Sexual activity: Not Currently  Other Topics  Concern   Not on file  Social History Narrative   Not on file   Social Determinants of Health   Financial Resource Strain: Not on file  Food Insecurity: No Food Insecurity (07/07/2022)   Hunger Vital Sign    Worried About Running Out of Food in the Last Year: Never true    Ran Out of Food in the Last Year: Never true  Transportation Needs: No Transportation Needs (07/07/2022)   PRAPARE - Administrator, Civil Service (Medical): No    Lack of Transportation (Non-Medical): No  Physical Activity: Not on file  Stress: Not on file  Social Connections: Not on file  Intimate Partner Violence: Not At Risk (07/07/2022)   Humiliation, Afraid, Rape, and Kick questionnaire    Fear of Current or Ex-Partner: No    Emotionally  Abused: No    Physically Abused: No    Sexually Abused: No    Family History: No family history on file.  Current Medications: No current facility-administered medications for this visit.  Current Outpatient Medications:    acidophilus (RISAQUAD) CAPS capsule, Take 1 capsule by mouth daily., Disp: , Rfl:    albuterol (VENTOLIN HFA) 108 (90 Base) MCG/ACT inhaler, Inhale 2 puffs into the lungs every 6 (six) hours as needed for wheezing or shortness of breath., Disp: , Rfl:    allopurinol (ZYLOPRIM) 300 MG tablet, Take 300 mg by mouth daily., Disp: , Rfl:    amiodarone (PACERONE) 200 MG tablet, Take 1 tablet (200 mg total) by mouth 2 (two) times daily for 7 days., Disp: 14 tablet, Rfl: 0   amiodarone (PACERONE) 200 MG tablet, Take 1 tablet (200 mg total) by mouth daily. (Patient not taking: Reported on 07/24/2022), Disp: 30 tablet, Rfl: 1   apixaban (ELIQUIS) 5 MG TABS tablet, Take 1 tablet (5 mg total) by mouth 2 (two) times daily., Disp: 120 tablet, Rfl: 0   atorvastatin (LIPITOR) 20 MG tablet, Take 20 mg by mouth daily., Disp: , Rfl:    dextrose 5 % SOLN 1,000 mL with fluorouracil 5 GM/100ML SOLN, Inject into the vein., Disp: , Rfl:    JARDIANCE 10 MG TABS  tablet, Take 20 mg by mouth daily., Disp: , Rfl:    LEUCOVORIN CALCIUM IV, Inject into the vein., Disp: , Rfl:    lidocaine-prilocaine (EMLA) cream, Apply 1 Application topically as needed., Disp: 30 g, Rfl: 0   lidocaine-prilocaine (EMLA) cream, Apply to affected area once, Disp: 30 g, Rfl: 3   loperamide (IMODIUM) 2 MG capsule, Take 2 mg by mouth 3 (three) times daily as needed for diarrhea or loose stools., Disp: , Rfl:    MELATONIN PO, Take 1 tablet by mouth at bedtime as needed (sleep)., Disp: , Rfl:    OXALIPLATIN IV, Inject into the vein., Disp: , Rfl:    prochlorperazine (COMPAZINE) 10 MG tablet, Take 1 tablet (10 mg total) by mouth every 6 (six) hours as needed for nausea or vomiting., Disp: 30 tablet, Rfl: 2   prochlorperazine (COMPAZINE) 10 MG tablet, Take 1 tablet (10 mg total) by mouth every 6 (six) hours as needed for nausea or vomiting., Disp: 60 tablet, Rfl: 4  Facility-Administered Medications Ordered in Other Visits:    acetaminophen (TYLENOL) tablet 650 mg, 650 mg, Oral, Q6H PRN **OR** acetaminophen (TYLENOL) suppository 650 mg, 650 mg, Rectal, Q6H PRN, Tat, David, MD   [COMPLETED] amiodarone (NEXTERONE) 1.8 mg/mL load via infusion 150 mg, 150 mg, Intravenous, Once, 150 mg at 08/02/22 0959 **FOLLOWED BY** [EXPIRED] amiodarone (NEXTERONE PREMIX) 360-4.14 MG/200ML-% (1.8 mg/mL) IV infusion, 60 mg/hr, Intravenous, Continuous, Paused at 08/02/22 1103 **FOLLOWED BY** amiodarone (NEXTERONE PREMIX) 360-4.14 MG/200ML-% (1.8 mg/mL) IV infusion, 30 mg/hr, Intravenous, Continuous, Tat, David, MD, Last Rate: 16.67 mL/hr at 08/02/22 1534, 30 mg/hr at 08/02/22 1534   apixaban (ELIQUIS) tablet 5 mg, 5 mg, Oral, BID, Strader, Brittany M, PA-C, 5 mg at 08/02/22 1302   atorvastatin (LIPITOR) tablet 20 mg, 20 mg, Oral, Daily, Tat, David, MD   Melene Muller ON 08/03/2022] Chlorhexidine Gluconate Cloth 2 % PADS 6 each, 6 each, Topical, Q0600, Tat, David, MD   furosemide (LASIX) injection 20 mg, 20 mg,  Intravenous, Once, Mallipeddi, Vishnu P, MD   insulin aspart (novoLOG) injection 0-9 Units, 0-9 Units, Subcutaneous, TID WC, Tat, David, MD   ondansetron (ZOFRAN) tablet 4 mg, 4 mg,  Oral, Q6H PRN **OR** ondansetron (ZOFRAN) injection 4 mg, 4 mg, Intravenous, Q6H PRN, Tat, David, MD   Allergies: No Known Allergies  REVIEW OF SYSTEMS:   Review of Systems  Constitutional:  Negative for chills, fatigue and fever.  HENT:   Negative for lump/mass, mouth sores, nosebleeds, sore throat and trouble swallowing.   Eyes:  Negative for eye problems.  Respiratory:  Positive for shortness of breath. Negative for cough.   Cardiovascular:  Negative for chest pain, leg swelling and palpitations.  Gastrointestinal:  Negative for abdominal pain, constipation, diarrhea, nausea and vomiting.  Genitourinary:  Negative for bladder incontinence, difficulty urinating, dysuria, frequency, hematuria and nocturia.   Musculoskeletal:  Negative for arthralgias, back pain, flank pain, myalgias and neck pain.  Skin:  Negative for itching and rash.  Neurological:  Negative for dizziness, headaches and numbness.  Hematological:  Does not bruise/bleed easily.  Psychiatric/Behavioral:  Negative for depression, sleep disturbance and suicidal ideas. The patient is not nervous/anxious.   All other systems reviewed and are negative.    VITALS:   There were no vitals taken for this visit.  Wt Readings from Last 3 Encounters:  08/02/22 178 lb 4.8 oz (80.9 kg)  08/02/22 178 lb 4.8 oz (80.9 kg)  07/24/22 172 lb 3.2 oz (78.1 kg)    There is no height or weight on file to calculate BMI.  Performance status (ECOG): 1 - Symptomatic but completely ambulatory  PHYSICAL EXAM:   Physical Exam Vitals and nursing note reviewed. Exam conducted with a chaperone present.  Constitutional:      Appearance: Normal appearance.  Cardiovascular:     Rate and Rhythm: Regular rhythm. Tachycardia present.     Pulses: Normal pulses.      Heart sounds: Normal heart sounds.  Pulmonary:     Effort: Pulmonary effort is normal.     Breath sounds: Normal breath sounds.  Abdominal:     Palpations: Abdomen is soft. There is no hepatomegaly, splenomegaly or mass.     Tenderness: There is no abdominal tenderness.  Musculoskeletal:     Right lower leg: No edema.     Left lower leg: No edema.  Lymphadenopathy:     Cervical: No cervical adenopathy.     Right cervical: No superficial, deep or posterior cervical adenopathy.    Left cervical: No superficial, deep or posterior cervical adenopathy.     Upper Body:     Right upper body: No supraclavicular or axillary adenopathy.     Left upper body: No supraclavicular or axillary adenopathy.  Neurological:     General: No focal deficit present.     Mental Status: He is alert and oriented to person, place, and time.  Psychiatric:        Mood and Affect: Mood normal.        Behavior: Behavior normal.     LABS:      Latest Ref Rng & Units 08/02/2022    8:02 AM 07/19/2022    8:15 AM 07/10/2022    3:47 AM  CBC  WBC 4.0 - 10.5 K/uL 11.1  7.0  5.4   Hemoglobin 13.0 - 17.0 g/dL 46.9  62.9  52.8   Hematocrit 39.0 - 52.0 % 47.6  46.2  40.6   Platelets 150 - 400 K/uL 162  201  142       Latest Ref Rng & Units 08/02/2022    8:02 AM 07/19/2022    8:15 AM 07/10/2022    3:47 AM  CMP  Glucose 70 - 99 mg/dL 865  784  90   BUN 8 - 23 mg/dL 18  31  16    Creatinine 0.61 - 1.24 mg/dL 6.96  2.95  2.84   Sodium 135 - 145 mmol/L 137  138  135   Potassium 3.5 - 5.1 mmol/L 4.1  3.8  3.8   Chloride 98 - 111 mmol/L 104  102  103   CO2 22 - 32 mmol/L 23  24  23    Calcium 8.9 - 10.3 mg/dL 8.8  8.8  8.0   Total Protein 6.5 - 8.1 g/dL 7.3  7.5    Total Bilirubin 0.3 - 1.2 mg/dL 0.7  0.6    Alkaline Phos 38 - 126 U/L 64  68    AST 15 - 41 U/L 18  21    ALT 0 - 44 U/L 20  24       Lab Results  Component Value Date   CEA1 2.6 04/26/2022   /  CEA  Date Value Ref Range Status  04/26/2022 2.6 0.0  - 4.7 ng/mL Final    Comment:    (NOTE)                             Nonsmokers          <3.9                             Smokers             <5.6 Roche [employer] Electrochemiluminescence Immunoassay (ECLIA) Values obtained with different assay methods or kits cannot be used interchangeably.  Results cannot be interpreted as absolute evidence of the presence or absence of malignant disease. Performed At: [facility] [address] [place], [place] [id number] Jolene Schimke MD VO:[id number]    No results found for: "PSA1" No results found for: "601 252 9086" No results found for: "CAN125"  No results found for: "TOTALPROTELP", "ALBUMINELP", "A1GS", "A2GS", "BETS", "BETA2SER", "GAMS", "MSPIKE", "SPEI" No results found for: "TIBC", "FERRITIN", "IRONPCTSAT" No results found for: "LDH"   STUDIES:   CT Angio Chest PE W and/or Wo Contrast  Result Date: 08/02/2022 CLINICAL DATA:  77 year old male with clinical suspicion of pulmonary embolism. History of rectal carcinoma. * Tracking Code: BO * EXAM: CT ANGIOGRAPHY CHEST WITH CONTRAST TECHNIQUE: Multidetector CT imaging of the chest was performed using the standard protocol during bolus administration of intravenous contrast. Multiplanar CT image reconstructions and MIPs were obtained to evaluate the vascular anatomy. RADIATION DOSE REDUCTION: This exam was performed according to the departmental dose-optimization program which includes automated exposure control, adjustment of the mA and/or kV according to patient size and/or use of iterative reconstruction technique. CONTRAST:  75mL OMNIPAQUE IOHEXOL 350 MG/ML SOLN COMPARISON:  PET-CT 05/11/2022. FINDINGS: Cardiovascular: No filling defects are noted in the pulmonary arterial tree to suggest pulmonary embolism. Heart size is normal. There is no significant pericardial fluid, thickening or pericardial calcification. There is aortic atherosclerosis, as well as atherosclerosis of the great  vessels of the mediastinum and the coronary arteries, including calcified atherosclerotic plaque in the left main, left anterior descending and left circumflex coronary arteries. Right internal jugular single-lumen Port-A-Cath with tip terminating at the superior cavoatrial junction. Mediastinum/Nodes: No pathologically enlarged mediastinal or hilar lymph nodes. Esophagus is unremarkable in appearance. No axillary lymphadenopathy. Lungs/Pleura: No acute consolidative airspace disease. No pleural effusions. No definite suspicious appearing pulmonary  nodules or masses are noted. Diffuse bronchial wall thickening with moderate centrilobular and paraseptal emphysema. Azygous lobe (normal anatomical variant) incidentally noted. Upper Abdomen: Unremarkable. Musculoskeletal: There are no aggressive appearing lytic or blastic lesions noted in the visualized portions of the skeleton. Review of the MIP images confirms the above findings. IMPRESSION: 1. No acute findings in the thorax. Specifically, no evidence of pulmonary embolism. 2. No evidence of metastatic disease to the thorax. 3. Diffuse bronchial wall thickening with moderate centrilobular and paraseptal emphysema; imaging findings suggestive of underlying COPD. 4. Aortic atherosclerosis, in addition to left main and 2 vessel coronary artery disease. Assessment for potential risk factor modification, dietary therapy or pharmacologic therapy may be warranted, if clinically indicated. Aortic Atherosclerosis (ICD10-I70.0) and Emphysema (ICD10-J43.9). Electronically Signed   By: Trudie Reed M.D.   On: 08/02/2022 12:22   DG Chest Portable 1 View  Result Date: 08/02/2022 CLINICAL DATA:  Palpitations.  Tachycardia. EXAM: PORTABLE CHEST 1 VIEW COMPARISON:  10/03/2005 FINDINGS: Power port in place with the tip in the SVC above the right atrium. Heart size is normal. Mediastinal shadows are normal. The lungs are clear. No edema, infiltrate, collapse or effusion.  IMPRESSION: No active disease. Power port in place. Electronically Signed   By: Paulina Fusi M.D.   On: 08/02/2022 11:02   ECHOCARDIOGRAM COMPLETE  Result Date: 07/08/2022    ECHOCARDIOGRAM REPORT   Patient Name:   James [facility] Date of Exam: 07/08/2022 Medical Rec #:  [id number]       Height:       71.0 in Accession #:    [id number]      Weight:       180.8 lb Date of Birth:  Nov 10, 1945       BSA:          2.020 m Patient Age:    77 years        BP:           93/66 mmHg Patient Gender: M               HR:           89 bpm. [facility] Procedure: 2D Echo, Intracardiac Opacification Agent, Color Doppler and Limited            Color Doppler Indications:    atrial flutter  History:        Patient has no prior history of Echocardiogram examinations.                 COPD and Cancer; Risk Factors:Diabetes and Dyslipidemia.  Sonographer:    Delcie Roch RDCS Referring Phys: [id number] PRATIK D [facility]  Sonographer Comments: Technically difficult study due to poor echo windows. Image acquisition challenging due to respiratory motion. IMPRESSIONS  1. Afib. Left ventricular ejection fraction, by estimation, is 40 to 45%. The left ventricle has mildly decreased function. The left ventricle demonstrates global hypokinesis. There is mild left ventricular hypertrophy. Left ventricular diastolic parameters are indeterminate.  2. Right ventricular systolic function is normal. The right ventricular size is normal. There is normal pulmonary artery systolic pressure. The estimated right ventricular systolic pressure is 24.0 mmHg.  3. Left atrial size was mildly dilated.  4. The mitral valve is normal in structure. No evidence of mitral valve regurgitation. No evidence of mitral stenosis.  5. The aortic valve is normal in structure. Aortic valve regurgitation is not visualized. No aortic stenosis is present.  6. The inferior vena cava is  dilated in size with >50% respiratory variability, suggesting right atrial pressure  of 8 mmHg. FINDINGS  Left Ventricle: Afib. Left ventricular ejection fraction, by estimation, is 40 to 45%. The left ventricle has mildly decreased function. The left ventricle demonstrates global hypokinesis. The left ventricular internal cavity size was normal in size. There is mild left ventricular hypertrophy. Left ventricular diastolic parameters are indeterminate. Right Ventricle: The right ventricular size is normal. No increase in right ventricular wall thickness. Right ventricular systolic function is normal. There is normal pulmonary artery systolic pressure. The tricuspid regurgitant velocity is 2.29 m/s, and  with an assumed right atrial pressure of 3 mmHg, the estimated right ventricular systolic pressure is 24.0 mmHg. Left Atrium: Left atrial size was mildly dilated. Right Atrium: Right atrial size was normal in size. Pericardium: There is no evidence of pericardial effusion. Mitral Valve: The mitral valve is normal in structure. No evidence of mitral valve regurgitation. No evidence of mitral valve stenosis. Tricuspid Valve: The tricuspid valve is normal in structure. Tricuspid valve regurgitation is trivial. No evidence of tricuspid stenosis. Aortic Valve: The aortic valve is normal in structure. Aortic valve regurgitation is not visualized. No aortic stenosis is present. Pulmonic Valve: The pulmonic valve was normal in structure. Pulmonic valve regurgitation is not visualized. No evidence of pulmonic stenosis. Aorta: The aortic root is normal in size and structure. Venous: The inferior vena cava is dilated in size with greater than 50% respiratory variability, suggesting right atrial pressure of 8 mmHg. IAS/Shunts: No atrial level shunt detected by color flow Doppler.  LEFT VENTRICLE PLAX 2D LVIDd:         5.20 cm LVIDs:         4.20 cm LV PW:         1.10 cm LV IVS:        1.20 cm LVOT diam:     2.20 cm LVOT Area:     3.80 cm  IVC IVC diam: 2.40 cm LEFT ATRIUM           Index LA diam:      4.10 cm  2.03 cm/m LA Vol (A4C): 58.7 ml 29.06 ml/m   AORTA Ao Root diam: 3.30 cm TRICUSPID VALVE TR Peak grad:   21.0 mmHg TR Vmax:        229.00 cm/s  SHUNTS Systemic Diam: 2.20 cm Donato Schultz MD Electronically signed by Donato Schultz MD Signature Date/Time: 07/08/2022/5:56:32 PM    Final

## 2022-08-02 ENCOUNTER — Inpatient Hospital Stay (HOSPITAL_COMMUNITY)
Admission: EM | Admit: 2022-08-02 | Discharge: 2022-08-04 | DRG: 309 | Disposition: A | Discharge status: Home / Self Care | Payer: PPO | Attending: Internal Medicine | Admitting: Internal Medicine

## 2022-08-02 ENCOUNTER — Emergency Department (HOSPITAL_COMMUNITY): Payer: PPO

## 2022-08-02 ENCOUNTER — Other Ambulatory Visit: Payer: Self-pay

## 2022-08-02 ENCOUNTER — Encounter (HOSPITAL_COMMUNITY): Payer: Self-pay

## 2022-08-02 ENCOUNTER — Inpatient Hospital Stay: Payer: PPO

## 2022-08-02 ENCOUNTER — Inpatient Hospital Stay (HOSPITAL_BASED_OUTPATIENT_CLINIC_OR_DEPARTMENT_OTHER): Payer: PPO | Admitting: Hematology

## 2022-08-02 VITALS — BP 98/81 | HR 146 | Temp 98.2°F | Resp 22 | Wt 178.3 lb

## 2022-08-02 DIAGNOSIS — Z7984 Long term (current) use of oral hypoglycemic drugs: Secondary | ICD-10-CM

## 2022-08-02 DIAGNOSIS — C7800 Secondary malignant neoplasm of unspecified lung: Secondary | ICD-10-CM | POA: Diagnosis present

## 2022-08-02 DIAGNOSIS — I5022 Chronic systolic (congestive) heart failure: Secondary | ICD-10-CM | POA: Diagnosis not present

## 2022-08-02 DIAGNOSIS — Z7901 Long term (current) use of anticoagulants: Secondary | ICD-10-CM | POA: Diagnosis not present

## 2022-08-02 DIAGNOSIS — I4891 Unspecified atrial fibrillation: Secondary | ICD-10-CM | POA: Diagnosis present

## 2022-08-02 DIAGNOSIS — Z87891 Personal history of nicotine dependence: Secondary | ICD-10-CM

## 2022-08-02 DIAGNOSIS — I4892 Unspecified atrial flutter: Principal | ICD-10-CM | POA: Diagnosis present

## 2022-08-02 DIAGNOSIS — I428 Other cardiomyopathies: Secondary | ICD-10-CM | POA: Diagnosis not present

## 2022-08-02 DIAGNOSIS — C20 Malignant neoplasm of rectum: Secondary | ICD-10-CM

## 2022-08-02 DIAGNOSIS — Z79899 Other long term (current) drug therapy: Secondary | ICD-10-CM

## 2022-08-02 DIAGNOSIS — Z992 Dependence on renal dialysis: Secondary | ICD-10-CM

## 2022-08-02 DIAGNOSIS — I43 Cardiomyopathy in diseases classified elsewhere: Secondary | ICD-10-CM

## 2022-08-02 DIAGNOSIS — I959 Hypotension, unspecified: Secondary | ICD-10-CM | POA: Diagnosis present

## 2022-08-02 DIAGNOSIS — E785 Hyperlipidemia, unspecified: Secondary | ICD-10-CM | POA: Diagnosis present

## 2022-08-02 DIAGNOSIS — J9611 Chronic respiratory failure with hypoxia: Secondary | ICD-10-CM | POA: Diagnosis not present

## 2022-08-02 DIAGNOSIS — E1165 Type 2 diabetes mellitus with hyperglycemia: Secondary | ICD-10-CM | POA: Insufficient documentation

## 2022-08-02 DIAGNOSIS — R000 Tachycardia, unspecified: Secondary | ICD-10-CM | POA: Diagnosis not present

## 2022-08-02 DIAGNOSIS — J449 Chronic obstructive pulmonary disease, unspecified: Secondary | ICD-10-CM | POA: Diagnosis not present

## 2022-08-02 DIAGNOSIS — Z95828 Presence of other vascular implants and grafts: Secondary | ICD-10-CM

## 2022-08-02 LAB — COMPREHENSIVE METABOLIC PANEL
ALT: 20 U/L (ref 0–44)
AST: 18 U/L (ref 15–41)
Albumin: 4.2 g/dL (ref 3.5–5.0)
Alkaline Phosphatase: 64 U/L (ref 38–126)
Anion gap: 10 (ref 5–15)
BUN: 18 mg/dL (ref 8–23)
CO2: 23 mmol/L (ref 22–32)
Calcium: 8.8 mg/dL — ABNORMAL LOW (ref 8.9–10.3)
Chloride: 104 mmol/L (ref 98–111)
Creatinine, Ser: 1.03 mg/dL (ref 0.61–1.24)
GFR, Estimated: >60 mL/min (ref >60)
Glucose, Bld: 132 mg/dL — ABNORMAL HIGH (ref 70–99)
Potassium: 4.1 mmol/L (ref 3.5–5.1)
Sodium: 137 mmol/L (ref 135–145)
Total Bilirubin: 0.7 mg/dL (ref 0.3–1.2)
Total Protein: 7.3 g/dL (ref 6.5–8.1)

## 2022-08-02 LAB — CBC WITH DIFFERENTIAL/PLATELET
Abs Immature Granulocytes: 0.11 10*3/uL — ABNORMAL HIGH (ref 0.00–0.07)
Basophils Absolute: 0.1 10*3/uL (ref 0.0–0.1)
Basophils Relative: 1 %
Eosinophils Absolute: 0.1 10*3/uL (ref 0.0–0.5)
Eosinophils Relative: 1 %
HCT: 47.6 % (ref 39.0–52.0)
Hemoglobin: 15.7 g/dL (ref 13.0–17.0)
Immature Granulocytes: 1 %
Lymphocytes Relative: 35 %
Lymphs Abs: 3.8 10*3/uL (ref 0.7–4.0)
MCH: 31.2 pg (ref 26.0–34.0)
MCHC: 33 g/dL (ref 30.0–36.0)
MCV: 94.6 fL (ref 80.0–100.0)
Monocytes Absolute: 1.1 10*3/uL — ABNORMAL HIGH (ref 0.1–1.0)
Monocytes Relative: 10 %
Neutro Abs: 5.9 10*3/uL (ref 1.7–7.7)
Neutrophils Relative %: 52 %
Platelets: 162 10*3/uL (ref 150–400)
RBC: 5.03 MIL/uL (ref 4.22–5.81)
RDW: 16.5 % — ABNORMAL HIGH (ref 11.5–15.5)
WBC: 11.1 10*3/uL — ABNORMAL HIGH (ref 4.0–10.5)
nRBC: 0 % (ref 0.0–0.2)

## 2022-08-02 LAB — GLUCOSE, CAPILLARY
Glucose-Capillary: 173 mg/dL — ABNORMAL HIGH (ref 70–99)
Glucose-Capillary: 138 mg/dL — ABNORMAL HIGH (ref 70–99)

## 2022-08-02 LAB — MRSA NEXT GEN BY PCR, NASAL: MRSA by PCR Next Gen: NOT DETECTED

## 2022-08-02 LAB — MAGNESIUM: Magnesium: 2.3 mg/dL (ref 1.7–2.4)

## 2022-08-02 LAB — BRAIN NATRIURETIC PEPTIDE: B Natriuretic Peptide: 250 pg/mL — ABNORMAL HIGH (ref 0.0–100.0)

## 2022-08-02 MED ORDER — LACTATED RINGERS IV BOLUS
500.0000 mL | Freq: Once | INTRAVENOUS | Status: AC
  Administered 2022-08-02: 500 mL via INTRAVENOUS

## 2022-08-02 MED ORDER — CHLORHEXIDINE GLUCONATE CLOTH 2 % EX PADS
6.0000 | MEDICATED_PAD | Freq: Every day | CUTANEOUS | Status: DC
  Administered 2022-08-03 – 2022-08-04 (×2): 6 via TOPICAL

## 2022-08-02 MED ORDER — SODIUM CHLORIDE 0.9% FLUSH
10.0000 mL | Freq: Once | INTRAVENOUS | Status: AC
  Administered 2022-08-02: 10 mL via INTRAVENOUS

## 2022-08-02 MED ORDER — ATORVASTATIN CALCIUM 20 MG PO TABS
20.0000 mg | ORAL_TABLET | Freq: Every day | ORAL | Status: DC
  Administered 2022-08-02 – 2022-08-04 (×3): 20 mg via ORAL
  Filled 2022-08-02 (×3): qty 1

## 2022-08-02 MED ORDER — FUROSEMIDE 10 MG/ML IJ SOLN
20.0000 mg | Freq: Once | INTRAMUSCULAR | Status: AC
  Administered 2022-08-02: 20 mg via INTRAVENOUS
  Filled 2022-08-02: qty 2

## 2022-08-02 MED ORDER — ACETAMINOPHEN 325 MG PO TABS: 650.0000 mg | ORAL_TABLET | Freq: Four times a day (QID) | ORAL | Status: DC | PRN

## 2022-08-02 MED ORDER — SODIUM CHLORIDE 0.9 % IV BOLUS
500.0000 mL | Freq: Once | INTRAVENOUS | Status: AC
  Administered 2022-08-02: 500 mL via INTRAVENOUS

## 2022-08-02 MED ORDER — AMIODARONE HCL IN DEXTROSE 360-4.14 MG/200ML-% IV SOLN
30.0000 mg/h | INTRAVENOUS | Status: DC
  Administered 2022-08-02 – 2022-08-04 (×4): 30 mg/h via INTRAVENOUS
  Filled 2022-08-02 (×4): qty 200

## 2022-08-02 MED ORDER — APIXABAN 5 MG PO TABS
5.0000 mg | ORAL_TABLET | Freq: Two times a day (BID) | ORAL | Status: DC
  Administered 2022-08-02 – 2022-08-03 (×4): 5 mg via ORAL
  Filled 2022-08-02 (×5): qty 1

## 2022-08-02 MED ORDER — AMIODARONE HCL IN DEXTROSE 360-4.14 MG/200ML-% IV SOLN
60.0000 mg/h | INTRAVENOUS | Status: DC
  Administered 2022-08-02: 60 mg/h via INTRAVENOUS
  Filled 2022-08-02: qty 200

## 2022-08-02 MED ORDER — INSULIN ASPART 100 UNIT/ML IJ SOLN
0.0000 [IU] | Freq: Three times a day (TID) | INTRAMUSCULAR | Status: DC
  Administered 2022-08-02: 2 [IU] via SUBCUTANEOUS
  Administered 2022-08-03 – 2022-08-04 (×3): 1 [IU] via SUBCUTANEOUS

## 2022-08-02 MED ORDER — IOHEXOL 350 MG/ML SOLN
75.0000 mL | Freq: Once | INTRAVENOUS | Status: AC | PRN
  Administered 2022-08-02: 75 mL via INTRAVENOUS

## 2022-08-02 MED ORDER — ONDANSETRON HCL 4 MG/2ML IJ SOLN: 4.0000 mg | Freq: Four times a day (QID) | INTRAMUSCULAR | Status: DC | PRN

## 2022-08-02 MED ORDER — AMIODARONE LOAD VIA INFUSION
150.0000 mg | Freq: Once | INTRAVENOUS | Status: AC
  Administered 2022-08-02: 150 mg via INTRAVENOUS
  Filled 2022-08-02: qty 83.34

## 2022-08-02 MED ORDER — ACETAMINOPHEN 650 MG RE SUPP: 650.0000 mg | Freq: Four times a day (QID) | RECTAL | Status: DC | PRN

## 2022-08-02 MED ORDER — ONDANSETRON HCL 4 MG PO TABS: 4.0000 mg | ORAL_TABLET | Freq: Four times a day (QID) | ORAL | Status: DC | PRN

## 2022-08-02 NOTE — ED Notes (Signed)
BP noted to drop to 72/47. HR 140-160. Amiodarone paused. MD and PA made aware.

## 2022-08-02 NOTE — ED Provider Notes (Signed)
[place] EMERGENCY DEPARTMENT AT [facility] Provider Note   CSN: [id number] Arrival date & time: 08/02/22  4098     History  Chief Complaint  Patient presents with   Tachycardia    James Miles is a 77 y.o. male.  He has PMH of metastatic rectal adenocarcinoma, COPD, recently diagnosed atrial flutter.  Presents the ER today complaining of fast heart rate.  He states he slept well last night, was feeling like his usual self this morning when he was getting ready for work chemotherapy he states he was getting in the truck and his son noticed a stain on his chart so he went inside to change, started feeling somewhat short of breath at this time, when he got to the cancer center they checked his vitals and noted a very fast heart rate.  He had been prescribed a cardiac monitor, this was applied by the nurse at the cancer center just this morning.  Denies cough, chest pain, current shortness of breath, increased swelling of his lower extremities or other complaints.  States he had not taken his amiodarone yet this morning.  HPI     Home Medications Prior to Admission medications   Medication Sig Start Date End Date Taking? Authorizing Provider  acidophilus (RISAQUAD) CAPS capsule Take 1 capsule by mouth daily.    [provider]  albuterol (VENTOLIN HFA) 108 (90 Base) MCG/ACT inhaler Inhale 2 puffs into the lungs every 6 (six) hours as needed for wheezing or shortness of breath. 03/29/19   [provider]  allopurinol (ZYLOPRIM) 300 MG tablet Take 300 mg by mouth daily. 03/29/19   [provider]  amiodarone (PACERONE) 200 MG tablet Take 1 tablet (200 mg total) by mouth 2 (two) times daily for 7 days. 07/10/22 07/24/22  Sherryll Burger, Pratik D, DO  amiodarone (PACERONE) 200 MG tablet Take 1 tablet (200 mg total) by mouth daily. Patient not taking: Reported on 07/24/2022 07/17/22 09/15/22  Maurilio Lovely D, DO  apixaban (ELIQUIS) 5 MG TABS tablet Take 1 tablet (5 mg  total) by mouth 2 (two) times daily. 07/10/22 09/08/22  Sherryll Burger, Pratik D, DO  atorvastatin (LIPITOR) 20 MG tablet Take 20 mg by mouth daily. 03/29/19   [provider]  dextrose 5 % SOLN 1,000 mL with fluorouracil 5 GM/100ML SOLN Inject into the vein.    [provider]  JARDIANCE 10 MG TABS tablet Take 20 mg by mouth daily.    [provider]  LEUCOVORIN CALCIUM IV Inject into the vein.    [provider]  lidocaine-prilocaine (EMLA) cream Apply 1 Application topically as needed. 05/17/22   Doreatha Massed, MD  lidocaine-prilocaine (EMLA) cream Apply to affected area once 06/07/22   Doreatha Massed, MD  loperamide (IMODIUM) 2 MG capsule Take 2 mg by mouth 3 (three) times daily as needed for diarrhea or loose stools.    [provider]  MELATONIN PO Take 1 tablet by mouth at bedtime as needed (sleep).    [provider]  OXALIPLATIN IV Inject into the vein.    [provider]  prochlorperazine (COMPAZINE) 10 MG tablet Take 1 tablet (10 mg total) by mouth every 6 (six) hours as needed for nausea or vomiting. 05/17/22   Doreatha Massed, MD  prochlorperazine (COMPAZINE) 10 MG tablet Take 1 tablet (10 mg total) by mouth every 6 (six) hours as needed for nausea or vomiting. 06/07/22   Doreatha Massed, MD      Allergies  Patient has no known allergies.    Review of Systems   Review of Systems  Physical Exam Updated Vital Signs BP 101/84 (BP Location: Left Arm)   Pulse (!) 111   Temp 98.4 F (36.9 C) (Oral)   Resp 20   Ht 5\' 11"  (1.803 m)   Wt 80.9 kg   SpO2 96%   BMI 24.87 kg/m  Physical Exam Vitals and nursing note reviewed.  Constitutional:      General: He is not in acute distress.    Appearance: He is well-developed.  HENT:     Head: Normocephalic and atraumatic.  Eyes:     Extraocular Movements: Extraocular movements intact.     Conjunctiva/sclera: Conjunctivae normal.     Pupils: Pupils are equal, round,  and reactive to light.  Cardiovascular:     Rate and Rhythm: Tachycardia present.     Heart sounds: No murmur heard. Pulmonary:     Effort: Pulmonary effort is normal. No respiratory distress.     Breath sounds: Normal breath sounds.  Abdominal:     Palpations: Abdomen is soft.     Tenderness: There is no abdominal tenderness. There is no guarding.  Musculoskeletal:        General: No swelling.     Cervical back: Neck supple.  Skin:    General: Skin is warm and dry.     Capillary Refill: Capillary refill takes less than 2 seconds.  Neurological:     General: No focal deficit present.     Mental Status: He is alert and oriented to person, place, and time.     Sensory: No sensory deficit.  Psychiatric:        Mood and Affect: Mood normal.     ED Results / Procedures / Treatments   Labs (all labs ordered are listed, but only abnormal results are displayed) Labs Reviewed  BASIC METABOLIC PANEL  CBC WITH DIFFERENTIAL/PLATELET  MAGNESIUM    EKG None  Radiology No results found.  Procedures Procedures    Medications Ordered in ED Medications - No data to display  ED Course/ Medical Decision Making/ A&P Clinical Course as of 08/02/22 1505  Wed Aug 02, 2022  1009 Presents with fast heart rate with rate in the 140s, history of atrial flutter, is on amiodarone currently for this but had taken his morning dose yet.  He is in the bed comfortably with no symptoms currently.  Labs were already drawn and resulted this morning from the cancer center and are normal including a normal magnesium and potassium.  Blood pressure is slightly low so we will be cautious with administration of antiarrhythmics and give him a small fluid bolus along with this to prevent hypotension.  Discussed with Dr. Rhunette Croft. [CB]  1159 Patient became hypotensive after amiodarone bolus despite fluid infusion.  This the amiodarone infusion was paused and patient's blood pressure returned back to normal, when  it was restarted his blood pressure dropped again.  It was paused again, I discussed with Dr. Jenene Slicker, the cardiologist.  She stated we should  make sure patient was adequately hydrated, can start in the amiodarone drip back at the slower rate.  She is going to see him in the ED afternoon.  Patient remains hypotensive and tachycardic, may need cardioverted  Patient had been not eaten or drank anything today but did give a 500 cc fluid bolus.  He has not had a PE study since this diagnosis, we will get a CTA of his chest  to rule out PE as a cause of his tachycardia and hypotension  [CB]  1430 Seen by cardiology in the ED.  Due to continued elevated heart rates on amiodarone drip will admit him to hospital medicine [CB]    Clinical Course User Index [CB] Ma Rings, PA-C                             Medical Decision Making This patient presents to the ED for concern of a flutter with RVR, this involves an extensive number of treatment options, and is a complaint that carries with it a high risk of complications and morbidity.  The differential diagnosis includes A-fib, dehydration, PE, other   Co morbidities that complicate the patient evaluation :   Rectal cancer   Additional history obtained:  Additional history obtained from EMR External records from outside source obtained and reviewed including prior ED notes, cardiology notes, A-fib clinic notes, cancer center notes, cancer center labs from today    Imaging Studies ordered:  I ordered imaging studies including CT Angio chest  I independently visualized and interpreted imaging which showed no PE I agree with the radiologist interpretation   Cardiac Monitoring: / EKG:  The patient was maintained on a cardiac monitor.  I personally viewed and interpreted the cardiac monitored which showed an underlying rhythm of: Atrial flutter with RVR   Consultations Obtained:  I requested consultation with the cardiologist Dr.  Jenene Slicker,  and discussed lab and imaging findings as well as pertinent plan - they recommend: admission due to persistent tachycardia on amiodarone   Problem List / ED Course / Critical interventions / Medication management  Show flutter with rapid ventricular response dialysis patient given for chemo treatment noted to be in a flutter with RVR, had not taken his amiodarone this morning, was given a small fluid bolus as he had not eaten or drink anything was given amiodarone bolus as well.  Patient became hypotensive as above, consult with cardiology, Dr. Sharion Dove the low bolus, patient has remained tachycardic, will plan on admission.  Discussed with hospitalist as well who is agreeable with this. I ordered medication including amiodarone for RVR  Reevaluation of the patient after these medicines showed that the patient improved I have reviewed the patients home medicines and have made adjustments as needed      Amount and/or Complexity of Data Reviewed Labs: ordered. Radiology: ordered.  Risk Prescription drug management. Decision regarding hospitalization.           Final Clinical Impression(s) / ED Diagnoses Final diagnoses:  None    Rx / DC Orders ED Discharge Orders     None         Ma Rings, PA-C 08/02/22 1515    Derwood Kaplan, MD 08/03/22 [phone]

## 2022-08-02 NOTE — Consult Note (Signed)
Cardiology Consultation   Patient ID: James Miles MRN: [id number]; DOB: May 13, 1945  Admit date: 08/02/2022 Date of Consult: 08/02/2022  PCP:  [name]   [facility] Providers Cardiologist:  Charlton Haws, MD        Patient Profile:   James Miles is a 77 y.o. male with a hx of recently diagnosed atrial flutter (diagnosed in 06/2022), COPD, Type 2 DM and Stage 2 rectal adenocarcinoma who is being seen 08/02/2022 for the evaluation of atrial flutter with RVR at the request of Dr. Rhunette Croft.  History of Present Illness:   Mr. Conely was admitted to [facility] from 5/31 - 07/10/2022 for evaluation of tachycardia which occurred while at the cancer center. He was initially felt to be in SVT but received adenosine and converted but had recurrent tachyarrhythmias which appeared most consistent with atrial flutter. He required IV Cardizem but had hypotension with this, therefore was switched to IV Amiodarone. He ultimately converted to normal sinus rhythm. He was discharged on Amiodarone 200 mg twice daily for 1 week followed by 200 mg daily with plans for a 30-day event monitor. Echocardiogram did show his EF was mildly reduced at 40 to 45% and it was felt this was likely tachycardia-mediated. It was recommend to reassess his EF as an outpatient and if still down, would plan for an outpatient Lexiscan Myoview for risk stratification.  It appears he was tachycardic at the time of an Oncology appointment on 07/21/2022 with heart rate in the 150's but he refused ED evaluation. Follow-up was arranged [facility] on 07/24/2022 and he was in normal sinus rhythm at that time. Given Amiodarone was not ideal given his COPD, options were reviewed with Dr. Elberta Fortis and he was referred to Dr. Ladona Ridgel for consideration of atrial flutter ablation. He has an upcoming appointment with Dr. Ladona Ridgel to further discuss this on 08/09/2022.  Today, he was at the [facility]  and prior to receiving chemotherapy he was noted to be tachycardic with heart rate in the 140's. In talking with the patient today, he reports being in his normal state of health until around 0300 this morning when he developed acute dyspnea and applied his supplemental oxygen with improvement. Denies any associated chest pain or palpitations. Did have some dyspnea this morning when changing clothes. Denies any associated symptoms. No recent lightheadedness, dizziness or presyncope. He reports compliance with Amiodarone and has not missed any doses. Has only been taking Eliquis once daily as he was unaware it was a BID medication.   Upon arrival to the ED, he received 150 mg IV Amiodarone load followed by 60 mg/h. He did have hypotension with this and IV dosing was temporarily held and restarted at 30 mg/h. He has also received 1 L of fluids. Initial labs showed WBC 11.1, Hgb 15.7, platelets 162, Na+ 137, K+ 4.1 and creatinine 1.03.  Magnesium 2.3.  CXR with no active disease. CTA pending. EKG shows atrial flutter with RVR, HR 143.   Past Medical History:  Diagnosis Date   Back pain    COPD (chronic obstructive pulmonary disease) (HCC)    Diabetes mellitus without complication (HCC)    Hyperlipidemia     Past Surgical History:  Procedure Laterality Date   BIOPSY  04/26/2022   Procedure: BIOPSY;  Surgeon: Corbin Ade, MD;  [facility]: [facility];  Service: Endoscopy;;   COLONOSCOPY WITH PROPOFOL N/A 04/26/2022   Procedure: COLONOSCOPY WITH PROPOFOL;  Surgeon: Jena Gauss,  Gerrit Friends, MD;  Location: [facility];  Service: Endoscopy;  Laterality: N/A;  10:00am; ASA 3   hemorhoidectomy     IR IMAGING GUIDED PORT INSERTION  05/12/2022   POLYPECTOMY  04/26/2022   Procedure: POLYPECTOMY;  Surgeon: Corbin Ade, MD;  Location: [facility];  Service: Endoscopy;;     Home Medications:  Prior to Admission medications   Medication Sig Start Date End Date Taking? Authorizing Provider  acidophilus  (RISAQUAD) CAPS capsule Take 1 capsule by mouth daily.    [provider]  albuterol (VENTOLIN HFA) 108 (90 Base) MCG/ACT inhaler Inhale 2 puffs into the lungs every 6 (six) hours as needed for wheezing or shortness of breath. 03/29/19   [provider]  allopurinol (ZYLOPRIM) 300 MG tablet Take 300 mg by mouth daily. 03/29/19   [provider]  amiodarone (PACERONE) 200 MG tablet Take 1 tablet (200 mg total) by mouth 2 (two) times daily for 7 days. 07/10/22 07/24/22  Sherryll Burger, Pratik D, DO  amiodarone (PACERONE) 200 MG tablet Take 1 tablet (200 mg total) by mouth daily. Patient not taking: Reported on 07/24/2022 07/17/22 09/15/22  Maurilio Lovely D, DO  apixaban (ELIQUIS) 5 MG TABS tablet Take 1 tablet (5 mg total) by mouth 2 (two) times daily. 07/10/22 09/08/22  Sherryll Burger, Pratik D, DO  atorvastatin (LIPITOR) 20 MG tablet Take 20 mg by mouth daily. 03/29/19   [provider]  dextrose 5 % SOLN 1,000 mL with fluorouracil 5 GM/100ML SOLN Inject into the vein.    [provider]  JARDIANCE 10 MG TABS tablet Take 20 mg by mouth daily.    [provider]  LEUCOVORIN CALCIUM IV Inject into the vein.    [provider]  lidocaine-prilocaine (EMLA) cream Apply 1 Application topically as needed. 05/17/22   Doreatha Massed, MD  lidocaine-prilocaine (EMLA) cream Apply to affected area once 06/07/22   Doreatha Massed, MD  loperamide (IMODIUM) 2 MG capsule Take 2 mg by mouth 3 (three) times daily as needed for diarrhea or loose stools.    [provider]  MELATONIN PO Take 1 tablet by mouth at bedtime as needed (sleep).    [provider]  OXALIPLATIN IV Inject into the vein.    [provider]  prochlorperazine (COMPAZINE) 10 MG tablet Take 1 tablet (10 mg total) by mouth every 6 (six) hours as needed for nausea or vomiting. 05/17/22   Doreatha Massed, MD  prochlorperazine (COMPAZINE) 10 MG tablet Take 1 tablet (10 mg total) by mouth  every 6 (six) hours as needed for nausea or vomiting. 06/07/22   Doreatha Massed, MD    Inpatient Medications: Scheduled Meds:  Continuous Infusions:  amiodarone Stopped (08/02/22 1103)   Followed by   amiodarone     PRN Meds:   Allergies:   No Known Allergies  Social History:   Social History   Socioeconomic History   Marital status: Widowed    Spouse name: Not on file   Number of children: 2   Years of education: Not on file   Highest education level: Not on file  Occupational History   Not on file  Tobacco Use   Smoking status: Former    Years: 50    Types: Cigarettes    Quit date: 08/2016    Years since quitting: 5.9   Smokeless tobacco: Never  Vaping Use   Vaping Use: Never used  Substance and Sexual Activity   Alcohol use: Never   Drug  use: Never   Sexual activity: Not Currently  Other Topics Concern   Not on file  Social History Narrative   Not on file   Social Determinants of Health   Financial Resource Strain: Not on file  Food Insecurity: No Food Insecurity (07/07/2022)   Hunger Vital Sign    Worried About Running Out of Food in the Last Year: Never true    Ran Out of Food in the Last Year: Never true  Transportation Needs: No Transportation Needs (07/07/2022)   PRAPARE - Administrator, Civil Service (Medical): No    Lack of Transportation (Non-Medical): No  Physical Activity: Not on file  Stress: Not on file  Social Connections: Not on file  Intimate Partner Violence: Not At Risk (07/07/2022)   Humiliation, Afraid, Rape, and Kick questionnaire    Fear of Current or Ex-Partner: No    Emotionally Abused: No    Physically Abused: No    Sexually Abused: No    Family History:   History reviewed. No pertinent family history.   ROS:  Please see the history of present illness.   All other ROS reviewed and negative.     Physical Exam/Data:   Vitals:   08/02/22 1056 08/02/22 1100 08/02/22 1105 08/02/22 1115  BP: (!) 81/54 (!)  72/57 (!) 88/72 (!) 88/75  Pulse: (!) 118 (!) 141 (!) 119 86  Resp: 18 (!) 22 17 19   Temp:      TempSrc:      SpO2: 93% 94% 93% 94%  Weight:      Height:        Intake/Output Summary (Last 24 hours) at 08/02/2022 1126 Last data filed at 08/02/2022 1044 Gross per 24 hour  Intake 500 ml  Output --  Net 500 ml      08/02/2022    9:27 AM 08/02/2022    8:08 AM 07/24/2022    2:53 PM  Last 3 Weights  Weight (lbs) 178 lb 4.8 oz 178 lb 4.8 oz 172 lb 3.2 oz  Weight (kg) 80.876 kg 80.876 kg 78.109 kg     Body mass index is 24.87 kg/m.  General:  Well nourished, well developed male appearing in no acute distress HEENT: normal Neck: no JVD Vascular: No carotid bruits; Distal pulses 2+ bilaterally Cardiac:  normal S1, S2; Irregularly irregular Lungs:  clear to auscultation bilaterally, no wheezing, rhonchi or rales  Abd: soft, nontender, no hepatomegaly  Ext: 1+ pitting edema bilaterally Musculoskeletal:  No deformities, BUE and BLE strength normal and equal Skin: warm and dry  Neuro:  CNs 2-12 intact, no focal abnormalities noted Psych:  Normal affect   EKG:  The EKG was personally reviewed and demonstrates: Atrial flutter with RVR, HR 143. Telemetry:  Telemetry was personally reviewed and demonstrates: Atrial flutter, HR in 110's to 120's. Occasional PVC's.   Relevant CV Studies:  Echocardiogram: 07/08/2022 IMPRESSIONS     1. Afib. Left ventricular ejection fraction, by estimation, is 40 to 45%.  The left ventricle has mildly decreased function. The left ventricle  demonstrates global hypokinesis. There is mild left ventricular  hypertrophy. Left ventricular diastolic  parameters are indeterminate.   2. Right ventricular systolic function is normal. The right ventricular  size is normal. There is normal pulmonary artery systolic pressure. The  estimated right ventricular systolic pressure is 24.0 mmHg.   3. Left atrial size was mildly dilated.   4. The mitral valve is normal  in structure. No evidence of mitral valve  regurgitation. No evidence of mitral stenosis.   5. The aortic valve is normal in structure. Aortic valve regurgitation is  not visualized. No aortic stenosis is present.   6. The inferior vena cava is dilated in size with >50% respiratory  variability, suggesting right atrial pressure of 8 mmHg.    Laboratory Data:  High Sensitivity Troponin:   Recent Labs  Lab 07/07/22 1610  TROPONINIHS 11     Chemistry Recent Labs  Lab 08/02/22 0802  NA 137  K 4.1  CL 104  CO2 23  GLUCOSE 132*  BUN 18  CREATININE 1.03  CALCIUM 8.8*  MG 2.3  GFRNONAA >60  ANIONGAP 10    Recent Labs  Lab 08/02/22 0802  PROT 7.3  ALBUMIN 4.2  AST 18  ALT 20  ALKPHOS 64  BILITOT 0.7   Lipids No results for input(s): "CHOL", "TRIG", "HDL", "LABVLDL", "LDLCALC", "CHOLHDL" in the last 168 hours.  Hematology Recent Labs  Lab 08/02/22 0802  WBC 11.1*  RBC 5.03  HGB 15.7  HCT 47.6  MCV 94.6  MCH 31.2  MCHC 33.0  RDW 16.5*  PLT 162   Thyroid No results for input(s): "TSH", "FREET4" in the last 168 hours.  BNPNo results for input(s): "BNP", "PROBNP" in the last 168 hours.  DDimer No results for input(s): "DDIMER" in the last 168 hours.   Radiology/Studies:  DG Chest Portable 1 View  Result Date: 08/02/2022 CLINICAL DATA:  Palpitations.  Tachycardia. EXAM: PORTABLE CHEST 1 VIEW COMPARISON:  10/03/2005 FINDINGS: Power port in place with the tip in the SVC above the right atrium. Heart size is normal. Mediastinal shadows are normal. The lungs are clear. No edema, infiltrate, collapse or effusion. IMPRESSION: No active disease. Power port in place. Electronically Signed   By: Paulina Fusi M.D.   On: 08/02/2022 11:02     Assessment and Plan:   1. Atrial Flutter with RVR - This is second admission for his arrhythmia within the past month and at least likely his third recurrence given his elevated heart rate at the time of his Oncology visit on  07/21/2022. Rate controlling agents have been limited given his hypotension and he is currently receiving IV Amiodarone. Would continue with this as BP continues to limit the use of Lopressor and Cardizem would not be ideal given his reduced EF. If he does not convert with this, could consider DCCV but he would require a TEE given that he has only been taking Eliquis once daily. Hopefully, he converts with IV Amiodarone and can be switched to a higher PO loading dose. He already has scheduled follow-up with EP for next week to discuss ablation. - Continue Eliquis 5 mg twice daily for anticoagulation. We reviewed the importance of compliance with twice daily dosing. If unable to remember this, would switch to Xarelto.  2. HFmrEF - His EF was at 40 to 45% by most recent imaging and felt to be tachycardia mediated in the setting of atrial flutter with RVR. He does have lower extremity edema on examination today.Will check a BNP. He was on Jardiance prior to admission and would continue. Otherwise, GDMT has been limited in the setting of hypotension.   3.  COPD - Uses supplemental oxygen as needed.   4. Rectal Adenocarcinoma - Being followed by Oncology and currently undergoing chemotherapy.   For questions or updates, please contact [facility] Please consult [email or web address] for contact info under    Signed, Ellsworth Lennox, PA-C  08/02/2022 11:26  AM

## 2022-08-02 NOTE — ED Notes (Signed)
Per cardiology, may restart Amiodarone at 30 mg/hr to avoid hypotension.

## 2022-08-02 NOTE — ED Notes (Signed)
Pt back to room from CT

## 2022-08-02 NOTE — Progress Notes (Signed)
Treatment held today due to HR of 140, history of AFIB per MD

## 2022-08-02 NOTE — Hospital Course (Signed)
77 year old male with a history of stage II rectal adenocarcinoma, COPD, diabetes mellitus type 2, hyperlipidemia, atrial fibrillation, and chronic respiratory failure on 2 L presenting with shortness of breath and atrial flutter. Notably, the patient had a recent hospital admission from 07/07/2022 to 07/10/2022 for atrial flutter with RVR.  Patient initially was on diltiazem drip, but this resulted in hypotension.  The patient was transitioned to amiodarone drip.  He ultimately converted to sinus rhythm and was discharged home with amiodarone 200 mg twice daily for a week followed by 200 mg daily.  30-day event monitor was ordered, but the patient never placed it on.  The patient went back into atrial flutter with RVR as noted on a clinic note in the cancer center on 07/21/2022.  He refused ED evaluation at that time.  He followed up in the cardiology clinic on 07/24/2022 at which time he was noted to be in normal sinus rhythm.  Unfortunately, the patient developed some shortness of breath that woke him up around 3 AM on 08/02/2022.  He also has some shortness of breath while changing his clothes in the morning.  He went to the cancer center for his usual chemotherapy.  He was noted to have atrial flutter with RVR.  Subsequently, the patient was sent to the emergency department for further evaluation and treatment. He denies any fevers, chills, chest pain, headache, neck pain, nausea, vomiting, direct abdominal pain.  He endorses compliance with his amiodarone.  He was only taking his apixaban once daily rather than twice daily.  In the ED, the patient was afebrile and tachycardic.  He was started on amiodarone  150 mg IV Amiodarone load followed by 60 mg/h. He did have hypotension with this and IV dosing was temporarily held and restarted at 30 mg/h. He has also received 1 L of fluids. Initial labs showed WBC 11.1, Hgb 15.7, platelets 162, Na+ 137, K+ 4.1 and creatinine 1.03.  Magnesium 2.3.  CXR with no active  disease.  CTA chest negative for PE.  It showed diffuse bronchial thickening with centrilobular and paraseptal emphysema.  Cardiology was consulted to assist with management.

## 2022-08-02 NOTE — ED Triage Notes (Addendum)
Pt brought by cancer center for tachycardia. Recent hx Afib RVR with admission and cardioversion. Denies CP, SOB, diaphoresis. A&O X 4 in triage. Actively receiving chemo for rectal cancer. Last tx 2 weeks ago.   At cancer center, HR 140s, BP 90/68.

## 2022-08-02 NOTE — Patient Instructions (Signed)
[facility] at [facility] Discharge Instructions   You were seen and examined today by Dr. Ellin Saba.  He reviewed the results of your lab work which are normal/stable.   We will hold your treatment today due to your elevated heart rate.   Return as scheduled.    Thank you for choosing [facility] at [facility] to provide your oncology and hematology care.  To afford each patient quality time with our provider, please arrive at least 15 minutes before your scheduled appointment time.   If you have a lab appointment with the Cancer Center please come in thru the Main Entrance and check in at the main information desk.  You need to re-schedule your appointment should you arrive 10 or more minutes late.  We strive to give you quality time with our providers, and arriving late affects you and other patients whose appointments are after yours.  Also, if you no show three or more times for appointments you may be dismissed from the clinic at the providers discretion.     Again, thank you for choosing [facility]  Our hope is that these requests will decrease the amount of time that you wait before being seen by our physicians.       _____________________________________________________________  Should you have questions after your visit to [facility], please contact our office at [phone] and follow the prompts.  Our office hours are 8:00 a.m. and 4:30 p.m. Monday - Friday.  Please note that voicemails left after 4:00 p.m. may not be returned until the following business day.  We are closed weekends and major holidays.  You do have access to a nurse 24-7, just call the main number to the clinic [phone] and do not press any options, hold on the line and a nurse will answer the phone.    For prescription refill requests, have your pharmacy contact our office and allow 72 hours.    Due to Covid, you will need to  wear a mask upon entering the hospital. If you do not have a mask, a mask will be given to you at the Main Entrance upon arrival. For doctor visits, patients may have 1 support person age 4 or older with them. For treatment visits, patients can not have anyone with them due to social distancing guidelines and our immunocompromised population.

## 2022-08-02 NOTE — ED Notes (Signed)
Pt transported to CT via stretcher.  

## 2022-08-02 NOTE — ED Notes (Signed)
PA at bedside assessing pt.

## 2022-08-02 NOTE — ED Notes (Signed)
Pt noted to be hypotensive 79/68. HR 100-110, noted to be A flutter on monitor. Amiodarone bolus is in, Amiodarone infusion not started d/t BP. Pt endorses continued SOB, denies CP. Repeat EKG obtained, Dr. Rhunette Croft called to bedside to assess patient.

## 2022-08-02 NOTE — ED Notes (Signed)
Per MD at bedside, can start amiodarone infusion if next 3 blood pressures stable. BP set to take every 5 minutes. Hold amiodarone for BP <80 systolic.

## 2022-08-02 NOTE — H&P (Signed)
History and Physical    Patient: James Miles [id number]:[id number] DOB: [birth year] DOA: 08/02/2022 DOS: the patient was seen and examined on 08/02/2022 PCP: [name]  Patient coming from: Home  Chief Complaint:  Chief Complaint  Patient presents with   Tachycardia   HPI: [name] is a 77 year old male with a history of stage II rectal adenocarcinoma, COPD, diabetes mellitus type 2, hyperlipidemia, atrial fibrillation, and chronic respiratory failure on 2 L presenting with shortness of breath and atrial flutter. Notably, the patient had a recent hospital admission from 07/07/2022 to 07/10/2022 for atrial flutter with RVR.  Patient initially was on diltiazem drip, but this resulted in hypotension.  The patient was transitioned to amiodarone drip.  He ultimately converted to sinus rhythm and was discharged home with amiodarone 200 mg twice daily for a week followed by 200 mg daily.  30-day event monitor was ordered, but the patient never placed it on.  The patient went back into atrial flutter with RVR as noted on a clinic note in the cancer center on 07/21/2022.  He refused ED evaluation at that time.  He followed up in the cardiology clinic on 07/24/2022 at which time he was noted to be in normal sinus rhythm.  Unfortunately, the patient developed some shortness of breath that woke him up around 3 AM on 08/02/2022.  He also has some shortness of breath while changing his clothes in the morning.  He went to the cancer center for his usual chemotherapy.  He was noted to have atrial flutter with RVR.  Subsequently, the patient was sent to the emergency department for further evaluation and treatment. He denies any fevers, chills, chest pain, headache, neck pain, nausea, vomiting, direct abdominal pain.  He endorses compliance with his amiodarone.  He was only taking his apixaban once daily rather than twice daily.  In the ED, the patient was afebrile and tachycardic.  He was started on  amiodarone  150 mg IV Amiodarone load followed by 60 mg/h. He did have hypotension with this and IV dosing was temporarily held and restarted at 30 mg/h. He has also received 1 L of fluids. Initial labs showed WBC 11.1, Hgb 15.7, platelets 162, Na+ 137, K+ 4.1 and creatinine 1.03.  Magnesium 2.3.  CXR with no active disease.  CTA chest negative for PE.  It showed diffuse bronchial thickening with centrilobular and paraseptal emphysema.  Cardiology was consulted to assist with management.  Review of Systems: As mentioned in the history of present illness. All other systems reviewed and are negative. Past Medical History:  Diagnosis Date   Back pain    COPD (chronic obstructive pulmonary disease) (HCC)    Diabetes mellitus without complication (HCC)    Hyperlipidemia    Past Surgical History:  Procedure Laterality Date   BIOPSY  04/26/2022   Procedure: BIOPSY;  Surgeon: Corbin Ade, MD;  [facility];  Service: Endoscopy;;   COLONOSCOPY WITH PROPOFOL N/A 04/26/2022   Procedure: COLONOSCOPY WITH PROPOFOL;  Surgeon: Corbin Ade, MD;  Location: [facility];  Service: Endoscopy;  Laterality: N/A;  10:00am; ASA 3   hemorhoidectomy     IR IMAGING GUIDED PORT INSERTION  05/12/2022   POLYPECTOMY  04/26/2022   Procedure: POLYPECTOMY;  Surgeon: Corbin Ade, MD;  Location: [facility];  Service: Endoscopy;;   Social History:  reports that he quit smoking about 5 years ago. His smoking use included cigarettes. He has never used smokeless tobacco.  He reports that he does not drink alcohol and does not use drugs.  No Known Allergies  History reviewed. No pertinent family history.  Prior to Admission medications   Medication Sig Start Date End Date Taking? Authorizing Provider  acidophilus (RISAQUAD) CAPS capsule Take 1 capsule by mouth daily.    [provider]  albuterol (VENTOLIN HFA) 108 (90 Base) MCG/ACT inhaler Inhale 2 puffs into the lungs every 6 (six) hours as  needed for wheezing or shortness of breath. 03/29/19   [provider]  allopurinol (ZYLOPRIM) 300 MG tablet Take 300 mg by mouth daily. 03/29/19   [provider]  amiodarone (PACERONE) 200 MG tablet Take 1 tablet (200 mg total) by mouth 2 (two) times daily for 7 days. 07/10/22 07/24/22  Sherryll Burger, Pratik D, DO  amiodarone (PACERONE) 200 MG tablet Take 1 tablet (200 mg total) by mouth daily. Patient not taking: Reported on 07/24/2022 07/17/22 09/15/22  Maurilio Lovely D, DO  apixaban (ELIQUIS) 5 MG TABS tablet Take 1 tablet (5 mg total) by mouth 2 (two) times daily. 07/10/22 09/08/22  Sherryll Burger, Pratik D, DO  atorvastatin (LIPITOR) 20 MG tablet Take 20 mg by mouth daily. 03/29/19   [provider]  dextrose 5 % SOLN 1,000 mL with fluorouracil 5 GM/100ML SOLN Inject into the vein.    [provider]  JARDIANCE 10 MG TABS tablet Take 20 mg by mouth daily.    [provider]  LEUCOVORIN CALCIUM IV Inject into the vein.    [provider]  lidocaine-prilocaine (EMLA) cream Apply 1 Application topically as needed. 05/17/22   Doreatha Massed, MD  lidocaine-prilocaine (EMLA) cream Apply to affected area once 06/07/22   Doreatha Massed, MD  loperamide (IMODIUM) 2 MG capsule Take 2 mg by mouth 3 (three) times daily as needed for diarrhea or loose stools.    [provider]  MELATONIN PO Take 1 tablet by mouth at bedtime as needed (sleep).    [provider]  OXALIPLATIN IV Inject into the vein.    [provider]  prochlorperazine (COMPAZINE) 10 MG tablet Take 1 tablet (10 mg total) by mouth every 6 (six) hours as needed for nausea or vomiting. 05/17/22   Doreatha Massed, MD  prochlorperazine (COMPAZINE) 10 MG tablet Take 1 tablet (10 mg total) by mouth every 6 (six) hours as needed for nausea or vomiting. 06/07/22   Doreatha Massed, MD    Physical Exam: Vitals:   08/02/22 1315 08/02/22 1400 08/02/22 1406 08/02/22 1415  BP: 103/84  96/75  102/77  Pulse: 85 100 97 73  Resp: 17 15 15 19   Temp: 98.2 F (36.8 C)     TempSrc: Oral     SpO2: 93% 94% 92% 93%  Weight:      Height:      GENERAL:  A&O x 3, NAD, well developed, cooperative, follows commands HEENT: [place]/AT, No thrush, No icterus, No oral ulcers Neck:  No neck mass, No meningismus, soft, supple CV: IRRR, no S3, no S4, no rub, no JVD Lungs: Diminished breath sounds.  Bibasilar rales.  No wheezing. Abd: soft/NT +BS, nondistended Ext: 1 + LE edema, no lymphangitis, no cyanosis, no rashes Neuro:  CN II-XII intact, strength 4/5 in RUE, RLE, strength 4/5 LUE, LLE; sensation intact bilateral; no dysmetria; babinski equivocal  Data Reviewed:  Data reviewed above in the history   Assessment and Plan: Atrial flutter with RVR -Continue amiodarone drip -Appreciate cardiology consult -May need DCCV given his soft blood  pressures on amiodarone if he does not convert -Continue apixaban  Chronic HFmrEF -Continue Jardiance -There appears to be some signs of fluid overload -Defer diuresis to cardiology -BNP 250  Chronic respiratory failure with hypoxia -He is supposed to be on 2 L nasal cannula but does not wear it consistently  Uncontrolled diabetes mellitus type 2 with hyperglycemia -07/08/2022 hemoglobin A1c 7.8 -He was on Jardiance as part of GDMT, otherwise not on any active therapy  COPD -Continue bronchodilators as needed  Rectal adenocarcinoma -Diagnosed 04/26/2022 by colonoscopy -05/11/2022 PET scan negative for metastasis -06/07/2022 FOLFOX started -Follow-up Dr. Luisa Hart    Advance Care Planning: FULL  Consults: cardiology  Family Communication: none  Severity of Illness: The appropriate patient status for this patient is INPATIENT. Inpatient status is judged to be reasonable and necessary in order to provide the required intensity of service to ensure the patient's safety. The patient's presenting symptoms, physical exam findings, and initial  radiographic and laboratory data in the context of their chronic comorbidities is felt to place them at high risk for further clinical deterioration. Furthermore, it is not anticipated that the patient will be medically stable for discharge from the hospital within 2 midnights of admission.   * I certify that at the point of admission it is my clinical judgment that the patient will require inpatient hospital care spanning beyond 2 midnights from the point of admission due to high intensity of service, high risk for further deterioration and high frequency of surveillance required.*  Author: Catarina Hartshorn, MD 08/02/2022 3:18 PM  For on call review [email or web address].

## 2022-08-03 ENCOUNTER — Inpatient Hospital Stay (INDEPENDENT_AMBULATORY_CARE_PROVIDER_SITE_OTHER): Payer: PPO

## 2022-08-03 ENCOUNTER — Other Ambulatory Visit (HOSPITAL_COMMUNITY): Payer: Self-pay | Admitting: *Deleted

## 2022-08-03 ENCOUNTER — Other Ambulatory Visit: Payer: Self-pay

## 2022-08-03 DIAGNOSIS — J9611 Chronic respiratory failure with hypoxia: Secondary | ICD-10-CM | POA: Diagnosis not present

## 2022-08-03 DIAGNOSIS — E1165 Type 2 diabetes mellitus with hyperglycemia: Secondary | ICD-10-CM | POA: Diagnosis not present

## 2022-08-03 DIAGNOSIS — C20 Malignant neoplasm of rectum: Secondary | ICD-10-CM | POA: Diagnosis not present

## 2022-08-03 DIAGNOSIS — I43 Cardiomyopathy in diseases classified elsewhere: Secondary | ICD-10-CM | POA: Diagnosis not present

## 2022-08-03 DIAGNOSIS — Z7901 Long term (current) use of anticoagulants: Secondary | ICD-10-CM

## 2022-08-03 DIAGNOSIS — I4892 Unspecified atrial flutter: Secondary | ICD-10-CM | POA: Diagnosis not present

## 2022-08-03 DIAGNOSIS — R000 Tachycardia, unspecified: Secondary | ICD-10-CM | POA: Diagnosis not present

## 2022-08-03 LAB — GLUCOSE, CAPILLARY
Glucose-Capillary: 136 mg/dL — ABNORMAL HIGH (ref 70–99)
Glucose-Capillary: 139 mg/dL — ABNORMAL HIGH (ref 70–99)
Glucose-Capillary: 115 mg/dL — ABNORMAL HIGH (ref 70–99)
Glucose-Capillary: 134 mg/dL — ABNORMAL HIGH (ref 70–99)

## 2022-08-03 LAB — BASIC METABOLIC PANEL
Anion gap: 11 (ref 5–15)
BUN: 22 mg/dL (ref 8–23)
CO2: 22 mmol/L (ref 22–32)
Calcium: 8.9 mg/dL (ref 8.9–10.3)
Chloride: 104 mmol/L (ref 98–111)
Creatinine, Ser: 1.09 mg/dL (ref 0.61–1.24)
GFR, Estimated: >60 mL/min (ref >60)
Glucose, Bld: 138 mg/dL — ABNORMAL HIGH (ref 70–99)
Potassium: 4.3 mmol/L (ref 3.5–5.1)
Sodium: 137 mmol/L (ref 135–145)

## 2022-08-03 LAB — MAGNESIUM: Magnesium: 2.2 mg/dL (ref 1.7–2.4)

## 2022-08-03 MED ORDER — IPRATROPIUM BROMIDE 0.02 % IN SOLN
0.5000 mg | Freq: Three times a day (TID) | RESPIRATORY_TRACT | Status: DC
  Administered 2022-08-03: 0.5 mg via RESPIRATORY_TRACT
  Filled 2022-08-03: qty 2.5

## 2022-08-03 MED ORDER — LEVALBUTEROL HCL 0.63 MG/3ML IN NEBU: 0.6300 mg | INHALATION_SOLUTION | Freq: Four times a day (QID) | RESPIRATORY_TRACT | Status: DC | PRN

## 2022-08-03 MED ORDER — LEVALBUTEROL HCL 0.63 MG/3ML IN NEBU
0.6300 mg | INHALATION_SOLUTION | Freq: Three times a day (TID) | RESPIRATORY_TRACT | Status: DC
  Administered 2022-08-03: 0.63 mg via RESPIRATORY_TRACT
  Filled 2022-08-03: qty 3

## 2022-08-03 MED ORDER — IPRATROPIUM BROMIDE 0.02 % IN SOLN: 0.5000 mg | Freq: Four times a day (QID) | RESPIRATORY_TRACT | Status: DC | PRN

## 2022-08-03 NOTE — TOC CM/SW Note (Signed)
Transition of Care [facility]) - Inpatient Brief Assessment   Patient Details  Name: James Miles MRN: [id number] Date of Birth: 1945-10-07  Transition of Care [facility]) CM/SW Contact:    Villa Herb, LCSWA Phone Number: 08/03/2022, 9:48 AM   Clinical Narrative: Transition of Care Department [facility]) has reviewed patient and no TOC needs have been identified at this time. We will continue to monitor patient advancement through interdisciplinary progression rounds. If new patient transition needs arise, please place a TOC consult.  Transition of Care Asessment: Insurance and Status: Insurance coverage has been reviewed Patient has primary care physician: Yes Home environment has been reviewed: from home Prior level of function:: indpendent Prior/Current Home Services: No current home services Social Determinants of Health Reivew: SDOH reviewed no interventions necessary Readmission risk has been reviewed: Yes Transition of care needs: no transition of care needs at this time

## 2022-08-03 NOTE — Progress Notes (Addendum)
Rounding Note    Patient Name: James Miles Date of Encounter: 08/03/2022  [facility] Cardiologist: Charlton Haws, MD   Subjective   Reports his breathing has improved. No chest pain or palpitations. Slept well overnight.   Inpatient Medications    Scheduled Meds:  apixaban  5 mg Oral BID   atorvastatin  20 mg Oral Daily   Chlorhexidine Gluconate Cloth  6 each Topical Q0600   insulin aspart  0-9 Units Subcutaneous TID WC   Continuous Infusions:  amiodarone 30 mg/hr (08/03/22 0418)   PRN Meds: acetaminophen **OR** acetaminophen, ondansetron **OR** ondansetron (ZOFRAN) IV   Vital Signs    Vitals:   08/03/22 0400 08/03/22 0444 08/03/22 0700 08/03/22 0726  BP: 113/75  118/76   Pulse: (!) 101  61   Resp: 17  16   Temp:  99.1 F (37.3 C)  98.4 F (36.9 C)  TempSrc:  Oral  Oral  SpO2: 94%  97%   Weight:  80 kg    Height:        Intake/Output Summary (Last 24 hours) at 08/03/2022 0749 Last data filed at 08/03/2022 0418 Gross per 24 hour  Intake 1335.78 ml  Output 2180 ml  Net -844.22 ml      08/03/2022    4:44 AM 08/02/2022    9:27 AM 08/02/2022    8:08 AM  Last 3 Weights  Weight (lbs) 176 lb 5.9 oz 178 lb 4.8 oz 178 lb 4.8 oz  Weight (kg) 80 kg 80.876 kg 80.876 kg      Telemetry    Atrial flutter, HR mostly in 90's to low-100's. Peaking into 130's.  - Personally Reviewed  ECG    Atrial flutter with RVR, HR 116. - Personally Reviewed  Physical Exam   GEN: Pleasant male appearing in no acute distress.   Neck: No JVD Cardiac: Irregularly irregular, no murmurs, rubs, or gallops.  Respiratory: Clear to auscultation bilaterally. GI: Soft, nontender, non-distended  MS: No pitting edema; No deformity. Neuro:  Nonfocal  Psych: Normal affect   Labs    High Sensitivity Troponin:   Recent Labs  Lab 07/07/22 1610  TROPONINIHS 11     Chemistry Recent Labs  Lab 08/02/22 0802 08/03/22 0429  NA 137 137  K 4.1 4.3  CL 104 104  CO2 23 22   GLUCOSE 132* 138*  BUN 18 22  CREATININE 1.03 1.09  CALCIUM 8.8* 8.9  MG 2.3 2.2  PROT 7.3  --   ALBUMIN 4.2  --   AST 18  --   ALT 20  --   ALKPHOS 64  --   BILITOT 0.7  --   GFRNONAA >60 >60  ANIONGAP 10 11    Lipids No results for input(s): "CHOL", "TRIG", "HDL", "LABVLDL", "LDLCALC", "CHOLHDL" in the last 168 hours.  Hematology Recent Labs  Lab 08/02/22 0802  WBC 11.1*  RBC 5.03  HGB 15.7  HCT 47.6  MCV 94.6  MCH 31.2  MCHC 33.0  RDW 16.5*  PLT 162   Thyroid No results for input(s): "TSH", "FREET4" in the last 168 hours.  BNP Recent Labs  Lab 08/02/22 0942  BNP 250.0*    DDimer No results for input(s): "DDIMER" in the last 168 hours.   Radiology    CT Angio Chest PE W and/or Wo Contrast  Result Date: 08/02/2022 CLINICAL DATA:  77 year old male with clinical suspicion of pulmonary embolism. History of rectal carcinoma. * Tracking Code: BO * EXAM: CT  ANGIOGRAPHY CHEST WITH CONTRAST TECHNIQUE: Multidetector CT imaging of the chest was performed using the standard protocol during bolus administration of intravenous contrast. Multiplanar CT image reconstructions and MIPs were obtained to evaluate the vascular anatomy. RADIATION DOSE REDUCTION: This exam was performed according to the departmental dose-optimization program which includes automated exposure control, adjustment of the mA and/or kV according to patient size and/or use of iterative reconstruction technique. CONTRAST:  75mL OMNIPAQUE IOHEXOL 350 MG/ML SOLN COMPARISON:  PET-CT 05/11/2022. FINDINGS: Cardiovascular: No filling defects are noted in the pulmonary arterial tree to suggest pulmonary embolism. Heart size is normal. There is no significant pericardial fluid, thickening or pericardial calcification. There is aortic atherosclerosis, as well as atherosclerosis of the great vessels of the mediastinum and the coronary arteries, including calcified atherosclerotic plaque in the left main, left anterior  descending and left circumflex coronary arteries. Right internal jugular single-lumen Port-A-Cath with tip terminating at the superior cavoatrial junction. Mediastinum/Nodes: No pathologically enlarged mediastinal or hilar lymph nodes. Esophagus is unremarkable in appearance. No axillary lymphadenopathy. Lungs/Pleura: No acute consolidative airspace disease. No pleural effusions. No definite suspicious appearing pulmonary nodules or masses are noted. Diffuse bronchial wall thickening with moderate centrilobular and paraseptal emphysema. Azygous lobe (normal anatomical variant) incidentally noted. Upper Abdomen: Unremarkable. Musculoskeletal: There are no aggressive appearing lytic or blastic lesions noted in the visualized portions of the skeleton. Review of the MIP images confirms the above findings. IMPRESSION: 1. No acute findings in the thorax. Specifically, no evidence of pulmonary embolism. 2. No evidence of metastatic disease to the thorax. 3. Diffuse bronchial wall thickening with moderate centrilobular and paraseptal emphysema; imaging findings suggestive of underlying COPD. 4. Aortic atherosclerosis, in addition to left main and 2 vessel coronary artery disease. Assessment for potential risk factor modification, dietary therapy or pharmacologic therapy may be warranted, if clinically indicated. Aortic Atherosclerosis (ICD10-I70.0) and Emphysema (ICD10-J43.9). Electronically Signed   By: Trudie Reed M.D.   On: 08/02/2022 12:22   DG Chest Portable 1 View  Result Date: 08/02/2022 CLINICAL DATA:  Palpitations.  Tachycardia. EXAM: PORTABLE CHEST 1 VIEW COMPARISON:  10/03/2005 FINDINGS: Power port in place with the tip in the SVC above the right atrium. Heart size is normal. Mediastinal shadows are normal. The lungs are clear. No edema, infiltrate, collapse or effusion. IMPRESSION: No active disease. Power port in place. Electronically Signed   By: Paulina Fusi M.D.   On: 08/02/2022 11:02    Cardiac  Studies   Echocardiogram: 07/2022 IMPRESSIONS     1. Afib. Left ventricular ejection fraction, by estimation, is 40 to 45%.  The left ventricle has mildly decreased function. The left ventricle  demonstrates global hypokinesis. There is mild left ventricular  hypertrophy. Left ventricular diastolic  parameters are indeterminate.   2. Right ventricular systolic function is normal. The right ventricular  size is normal. There is normal pulmonary artery systolic pressure. The  estimated right ventricular systolic pressure is 24.0 mmHg.   3. Left atrial size was mildly dilated.   4. The mitral valve is normal in structure. No evidence of mitral valve  regurgitation. No evidence of mitral stenosis.   5. The aortic valve is normal in structure. Aortic valve regurgitation is  not visualized. No aortic stenosis is present.   6. The inferior vena cava is dilated in size with >50% respiratory  variability, suggesting right atrial pressure of 8 mmHg.   Patient Profile     77 y.o. male w/ PMH of recently diagnosed atrial flutter (diagnosed in  06/2022), HFmrEF, COPD, Type 2 DM and Stage 2 rectal adenocarcinoma who is currently admitted with recurrent atrial flutter with RVR.   Assessment & Plan   1. Atrial Flutter with RVR - He remains in atrial flutter this morning and rates were in the 90's overnight but quickly increase into the 120's to 130's with minimal activity. Currently on IV Amiodarone. If he does not convert with IV Amiodarone (previously did during his prior admission), will schedule for TEE-guided DCCV as previously recommended by Dr. Jenene Slicker. Will require TEE as he was only taking Eliquis once daily prior to admission and compliance with BID dosing has been reviewed. Will tentatively schedule for tomorrow. Continue Eliquis and IV Amiodarone today. Can possibly add Bisoprolol 2.5mg  daily pending BP trend.   - Informed Consent   Shared Decision Making/Informed Consent{ The risks  [stroke, cardiac arrhythmias rarely resulting in the need for a temporary or permanent pacemaker, skin irritation or burns, esophageal damage, perforation (1:10,000 risk), bleeding, pharyngeal hematoma as well as other potential complications associated with conscious sedation including aspiration, arrhythmia, respiratory failure and death], benefits (treatment guidance, restoration of normal sinus rhythm, diagnostic support) and alternatives of a transesophageal echocardiogram guided cardioversion were discussed in detail with Mr. Pitera and he is willing to proceed.    2. HFmrEF - EF was at 40-45% by echo earlier this month and his cardiomyopathy is felt to be tachycardia-mediated in the setting of paroxysmal atrial flutter with RVR. BNP was mildly elevated at 250 yesterday and he received IV Lasix  20mg  x1. Appears euvolemic by examination today.  - Was hypotensive yesterday after IV Amiodarone bolus but improved overnight and this morning, SBP in the 90's to 110's. Can possibly add low-dose Bisoprolol 2.5mg  daily. BP does not allow for ACE-I/ARB/ARNI/MRA. He is on Jardiance at home and would plan to restart after TEE/DCCV. Hold due to anesthesia.    3.  COPD - Uses supplemental oxygen as needed at home. Currently on 2L [place] with appropriate oxygen saturations.    4. Rectal Adenocarcinoma - Followed by Oncology as an outpatient and undergoing chemotherapy. Treatment was held yesterday since in atrial flutter with RVR.   For questions or updates, please contact [facility] Please consult [email or web address] for contact info under    Signed, Ellsworth Lennox, PA-C  08/03/2022, 7:49 AM      Attending attestation  Patient seen and independently examined with Randall An, PA-C. We discussed all aspects of the encounter. I agree with the assessment and plan as stated above.  No acute events overnight, patient continues to be in atrial flutter with RVR, HR 90-120s. No symptoms of angina  or DOE. Patient is posted for TEE guided DCCV on 08/04/2022. Informed consent has been obtained, please see above.  Continue IV amiodarone drip. BP is soft and precluding addition of rate controlling agents. Continue Eliquis 5 mg twice daily can be switched to Xarelto 20 mg upon discharge due to forgetfulness issues.  Physical examination is remarkable for patient not in acute respiratory distress, HEENT normal, irregular rate and rhythm, no murmur, clear lungs, abdomen NT and ND, trace edema in lower extremities.  Zulay Corrie Verne Spurr, MD [place]  [facility]  9:43 AM

## 2022-08-03 NOTE — Progress Notes (Signed)
PROGRESS NOTE  James Miles UJW:[id number] DOB: December 27, 1945 DOA: 08/02/2022 PCP: [name]  Brief History:  77 year old male with a history of stage II rectal adenocarcinoma, COPD, diabetes mellitus type 2, hyperlipidemia, atrial fibrillation, and chronic respiratory failure on 2 L presenting with shortness of breath and atrial flutter. Notably, the patient had a recent hospital admission from 07/07/2022 to 07/10/2022 for atrial flutter with RVR.  Patient initially was on diltiazem drip, but this resulted in hypotension.  The patient was transitioned to amiodarone drip.  He ultimately converted to sinus rhythm and was discharged home with amiodarone 200 mg twice daily for a week followed by 200 mg daily.  30-day event monitor was ordered, but the patient never placed it on.  The patient went back into atrial flutter with RVR as noted on a clinic note in the cancer center on 07/21/2022.  He refused ED evaluation at that time.  He followed up in the cardiology clinic on 07/24/2022 at which time he was noted to be in normal sinus rhythm.  Unfortunately, the patient developed some shortness of breath that woke him up around 3 AM on 08/02/2022.  He also has some shortness of breath while changing his clothes in the morning.  He went to the cancer center for his usual chemotherapy.  He was noted to have atrial flutter with RVR.  Subsequently, the patient was sent to the emergency department for further evaluation and treatment. He denies any fevers, chills, chest pain, headache, neck pain, nausea, vomiting, direct abdominal pain.  He endorses compliance with his amiodarone.  He was only taking his apixaban once daily rather than twice daily.  In the ED, the patient was afebrile and tachycardic.  He was started on amiodarone  150 mg IV Amiodarone load followed by 60 mg/h. He did have hypotension with this and IV dosing was temporarily held and restarted at 30 mg/h. He has also received 1  L of fluids. Initial labs showed WBC 11.1, Hgb 15.7, platelets 162, Na+ 137, K+ 4.1 and creatinine 1.03.  Magnesium 2.3.  CXR with no active disease.  CTA chest negative for PE.  It showed diffuse bronchial thickening with centrilobular and paraseptal emphysema.  Cardiology was consulted to assist with management.   Assessment/Plan: Atrial flutter with RVR -Continue amiodarone drip-continues to have RVR -Appreciate cardiology consult -cards planning for possible DCCV -Continue apixaban   Chronic HFmrEF -Continue Jardiance -There appears to be some signs of fluid overload -received lasix IV x 1 6/26 -BNP 250   Chronic respiratory failure with hypoxia -He is supposed to be on 2 L nasal cannula but does not wear it consistently   Uncontrolled diabetes mellitus type 2 with hyperglycemia -07/08/2022 hemoglobin A1c 7.8 -He was on Jardiance as part of GDMT, otherwise not on any active therapy   COPD -Continue bronchodilators as needed   Rectal adenocarcinoma -Diagnosed 04/26/2022 by colonoscopy -05/11/2022 PET scan negative for metastasis -06/07/2022 FOLFOX started -Follow-up Dr. Ellin Saba            Family Communication:  no Family at bedside  Consultants:  cardiology  Code Status:  FULL   DVT Prophylaxis:  apixaban   Procedures: As Listed in Progress Note Above  Antibiotics: None        Subjective: Patient denies fevers, chills, headache, chest pain, dyspnea, nausea, vomiting, diarrhea, abdominal pain, dysuria, hematuria, hematochezia, and melena.  Breathing better today   Objective: Vitals:   08/03/22  0800 08/03/22 0804 08/03/22 0808 08/03/22 0812  BP: 115/84     Pulse:    80  Resp:  19 20 19   Temp:      TempSrc:      SpO2:  97%  97%  Weight:      Height:        Intake/Output Summary (Last 24 hours) at 08/03/2022 0859 Last data filed at 08/03/2022 0836 Gross per 24 hour  Intake 1575.78 ml  Output 2180 ml  Net -604.22 ml   Weight change:   Exam:  General:  Pt is alert, follows commands appropriately, not in acute distress HEENT: No icterus, No thrush, No neck mass, [place]/AT Cardiovascular: IRRR, S1/S2, no rubs, no gallops Respiratory: diminished BS.  Bibasilar rales. No wheeze Abdomen: Soft/+BS, non tender, non distended, no guarding Extremities: 1 + LE edema, No lymphangitis, No petechiae, No rashes, no synovitis   Data Reviewed: I have personally reviewed following labs and imaging studies Basic Metabolic Panel: Recent Labs  Lab 08/02/22 0802 08/03/22 0429  NA 137 137  K 4.1 4.3  CL 104 104  CO2 23 22  GLUCOSE 132* 138*  BUN 18 22  CREATININE 1.03 1.09  CALCIUM 8.8* 8.9  MG 2.3 2.2   Liver Function Tests: Recent Labs  Lab 08/02/22 0802  AST 18  ALT 20  ALKPHOS 64  BILITOT 0.7  PROT 7.3  ALBUMIN 4.2   No results for input(s): "LIPASE", "AMYLASE" in the last 168 hours. No results for input(s): "AMMONIA" in the last 168 hours. Coagulation Profile: No results for input(s): "INR", "PROTIME" in the last 168 hours. CBC: Recent Labs  Lab 08/02/22 0802  WBC 11.1*  NEUTROABS 5.9  HGB 15.7  HCT 47.6  MCV 94.6  PLT 162   Cardiac Enzymes: No results for input(s): "CKTOTAL", "CKMB", "CKMBINDEX", "TROPONINI" in the last 168 hours. BNP: Invalid input(s): "POCBNP" CBG: Recent Labs  Lab 08/02/22 1705 08/02/22 2031 08/03/22 0723  GLUCAP 173* 138* 136*   HbA1C: No results for input(s): "HGBA1C" in the last 72 hours. Urine analysis:    Component Value Date/Time   APPEARANCEUR Clear 06/02/2022 0852   GLUCOSEU 3+ (A) 06/02/2022 0852   BILIRUBINUR Negative 06/02/2022 0852   PROTEINUR Negative 06/02/2022 0852   NITRITE Negative 06/02/2022 0852   LEUKOCYTESUR Negative 06/02/2022 0852   Sepsis Labs: @LABRCNTIP (procalcitonin:4,lacticidven:4) ) Recent Results (from the past 240 hour(s))  MRSA Next Gen by PCR, Nasal     Status: None   Collection Time: 08/02/22  5:20 PM   Specimen: Nasal Mucosa;  Nasal Swab  Result Value Ref Range Status   MRSA by PCR Next Gen NOT DETECTED NOT DETECTED Final    Comment: (NOTE) The GeneXpert MRSA Assay (FDA approved for NASAL specimens only), is one component of a comprehensive MRSA colonization surveillance program. It is not intended to diagnose MRSA infection nor to guide or monitor treatment for MRSA infections. Test performance is not FDA approved in patients less than 4 years old. Performed at [facility] [address], [place], [place] [address]      Scheduled Meds:  apixaban  5 mg Oral BID   atorvastatin  20 mg Oral Daily   Chlorhexidine Gluconate Cloth  6 each Topical Q0600   insulin aspart  0-9 Units Subcutaneous TID WC   Continuous Infusions:  amiodarone 30 mg/hr (08/03/22 0418)    Procedures/Studies: CT Angio Chest PE W and/or Wo Contrast  Result Date: 08/02/2022 CLINICAL DATA:  77 year old male with clinical suspicion  of pulmonary embolism. History of rectal carcinoma. * Tracking Code: BO * EXAM: CT ANGIOGRAPHY CHEST WITH CONTRAST TECHNIQUE: Multidetector CT imaging of the chest was performed using the standard protocol during bolus administration of intravenous contrast. Multiplanar CT image reconstructions and MIPs were obtained to evaluate the vascular anatomy. RADIATION DOSE REDUCTION: This exam was performed according to the departmental dose-optimization program which includes automated exposure control, adjustment of the mA and/or kV according to patient size and/or use of iterative reconstruction technique. CONTRAST:  75mL OMNIPAQUE IOHEXOL 350 MG/ML SOLN COMPARISON:  PET-CT 05/11/2022. FINDINGS: Cardiovascular: No filling defects are noted in the pulmonary arterial tree to suggest pulmonary embolism. Heart size is normal. There is no significant pericardial fluid, thickening or pericardial calcification. There is aortic atherosclerosis, as well as atherosclerosis of the great vessels of the mediastinum and the coronary  arteries, including calcified atherosclerotic plaque in the left main, left anterior descending and left circumflex coronary arteries. Right internal jugular single-lumen Port-A-Cath with tip terminating at the superior cavoatrial junction. Mediastinum/Nodes: No pathologically enlarged mediastinal or hilar lymph nodes. Esophagus is unremarkable in appearance. No axillary lymphadenopathy. Lungs/Pleura: No acute consolidative airspace disease. No pleural effusions. No definite suspicious appearing pulmonary nodules or masses are noted. Diffuse bronchial wall thickening with moderate centrilobular and paraseptal emphysema. Azygous lobe (normal anatomical variant) incidentally noted. Upper Abdomen: Unremarkable. Musculoskeletal: There are no aggressive appearing lytic or blastic lesions noted in the visualized portions of the skeleton. Review of the MIP images confirms the above findings. IMPRESSION: 1. No acute findings in the thorax. Specifically, no evidence of pulmonary embolism. 2. No evidence of metastatic disease to the thorax. 3. Diffuse bronchial wall thickening with moderate centrilobular and paraseptal emphysema; imaging findings suggestive of underlying COPD. 4. Aortic atherosclerosis, in addition to left main and 2 vessel coronary artery disease. Assessment for potential risk factor modification, dietary therapy or pharmacologic therapy may be warranted, if clinically indicated. Aortic Atherosclerosis (ICD10-I70.0) and Emphysema (ICD10-J43.9). Electronically Signed   By: Trudie Reed M.D.   On: 08/02/2022 12:22   DG Chest Portable 1 View  Result Date: 08/02/2022 CLINICAL DATA:  Palpitations.  Tachycardia. EXAM: PORTABLE CHEST 1 VIEW COMPARISON:  10/03/2005 FINDINGS: Power port in place with the tip in the SVC above the right atrium. Heart size is normal. Mediastinal shadows are normal. The lungs are clear. No edema, infiltrate, collapse or effusion. IMPRESSION: No active disease. Power port in  place. Electronically Signed   By: Paulina Fusi M.D.   On: 08/02/2022 11:02   ECHOCARDIOGRAM COMPLETE  Result Date: 07/08/2022    ECHOCARDIOGRAM REPORT   Patient Name:   James Miles Date of Exam: 07/08/2022 Medical Rec #:  [id number]       Height:       71.0 in Accession #:    [id number]      Weight:       180.8 lb Date of Birth:  03-14-1945       BSA:          2.020 m Patient Age:    77 years        BP:           93/66 mmHg Patient Gender: M               HR:           89 bpm. [facility] Procedure: 2D Echo, Intracardiac Opacification Agent, Color Doppler and Limited  Color Doppler Indications:    atrial flutter  History:        Patient has no prior history of Echocardiogram examinations.                 COPD and Cancer; Risk Factors:Diabetes and Dyslipidemia.  Sonographer:    Delcie Roch RDCS Referring Phys: [id number] PRATIK D [facility]  Sonographer Comments: Technically difficult study due to poor echo windows. Image acquisition challenging due to respiratory motion. IMPRESSIONS  1. Afib. Left ventricular ejection fraction, by estimation, is 40 to 45%. The left ventricle has mildly decreased function. The left ventricle demonstrates global hypokinesis. There is mild left ventricular hypertrophy. Left ventricular diastolic parameters are indeterminate.  2. Right ventricular systolic function is normal. The right ventricular size is normal. There is normal pulmonary artery systolic pressure. The estimated right ventricular systolic pressure is 24.0 mmHg.  3. Left atrial size was mildly dilated.  4. The mitral valve is normal in structure. No evidence of mitral valve regurgitation. No evidence of mitral stenosis.  5. The aortic valve is normal in structure. Aortic valve regurgitation is not visualized. No aortic stenosis is present.  6. The inferior vena cava is dilated in size with >50% respiratory variability, suggesting right atrial pressure of 8 mmHg. FINDINGS  Left Ventricle: Afib.  Left ventricular ejection fraction, by estimation, is 40 to 45%. The left ventricle has mildly decreased function. The left ventricle demonstrates global hypokinesis. The left ventricular internal cavity size was normal in size. There is mild left ventricular hypertrophy. Left ventricular diastolic parameters are indeterminate. Right Ventricle: The right ventricular size is normal. No increase in right ventricular wall thickness. Right ventricular systolic function is normal. There is normal pulmonary artery systolic pressure. The tricuspid regurgitant velocity is 2.29 m/s, and  with an assumed right atrial pressure of 3 mmHg, the estimated right ventricular systolic pressure is 24.0 mmHg. Left Atrium: Left atrial size was mildly dilated. Right Atrium: Right atrial size was normal in size. Pericardium: There is no evidence of pericardial effusion. Mitral Valve: The mitral valve is normal in structure. No evidence of mitral valve regurgitation. No evidence of mitral valve stenosis. Tricuspid Valve: The tricuspid valve is normal in structure. Tricuspid valve regurgitation is trivial. No evidence of tricuspid stenosis. Aortic Valve: The aortic valve is normal in structure. Aortic valve regurgitation is not visualized. No aortic stenosis is present. Pulmonic Valve: The pulmonic valve was normal in structure. Pulmonic valve regurgitation is not visualized. No evidence of pulmonic stenosis. Aorta: The aortic root is normal in size and structure. Venous: The inferior vena cava is dilated in size with greater than 50% respiratory variability, suggesting right atrial pressure of 8 mmHg. IAS/Shunts: No atrial level shunt detected by color flow Doppler.  LEFT VENTRICLE PLAX 2D LVIDd:         5.20 cm LVIDs:         4.20 cm LV PW:         1.10 cm LV IVS:        1.20 cm LVOT diam:     2.20 cm LVOT Area:     3.80 cm  IVC IVC diam: 2.40 cm LEFT ATRIUM           Index LA diam:      4.10 cm 2.03 cm/m LA Vol (A4C): 58.7 ml 29.06  ml/m   AORTA Ao Root diam: 3.30 cm TRICUSPID VALVE TR Peak grad:   21.0 mmHg TR Vmax:  229.00 cm/s  SHUNTS Systemic Diam: 2.20 cm Donato Schultz MD Electronically signed by Donato Schultz MD Signature Date/Time: 07/08/2022/5:56:32 PM    Final     Catarina Hartshorn, DO  [facility]  If 7PM-7AM, please contact night-coverage [email or web address] Password [email or web address] 08/03/2022, 8:59 AM   LOS: 1 day

## 2022-08-03 NOTE — Progress Notes (Signed)
At 1010 this morning, telemetry noted to convert from A-flutter to Normal Sinus Rhythm with heart rate in the 60's. Patient alert and oriented x4 and sitting up in chair with no complaints of pain, shortness of breath or chest discomfort. EKG done and Dr Tat and Dr Jenene Slicker made aware of change and EKG. Patient still signed consent for possible procedure tomorrow IF needed to be done. Will continue to monitor. Patient continues on the Amio drip.

## 2022-08-04 ENCOUNTER — Other Ambulatory Visit: Payer: Self-pay

## 2022-08-04 ENCOUNTER — Inpatient Hospital Stay: Payer: PPO

## 2022-08-04 ENCOUNTER — Other Ambulatory Visit (HOSPITAL_COMMUNITY): Payer: PPO

## 2022-08-04 DIAGNOSIS — C20 Malignant neoplasm of rectum: Secondary | ICD-10-CM | POA: Diagnosis not present

## 2022-08-04 DIAGNOSIS — R000 Tachycardia, unspecified: Secondary | ICD-10-CM | POA: Diagnosis not present

## 2022-08-04 DIAGNOSIS — E1165 Type 2 diabetes mellitus with hyperglycemia: Secondary | ICD-10-CM | POA: Diagnosis not present

## 2022-08-04 DIAGNOSIS — J9611 Chronic respiratory failure with hypoxia: Secondary | ICD-10-CM | POA: Diagnosis not present

## 2022-08-04 DIAGNOSIS — I4891 Unspecified atrial fibrillation: Secondary | ICD-10-CM

## 2022-08-04 DIAGNOSIS — I4892 Unspecified atrial flutter: Secondary | ICD-10-CM | POA: Diagnosis not present

## 2022-08-04 LAB — GLUCOSE, CAPILLARY
Glucose-Capillary: 144 mg/dL — ABNORMAL HIGH (ref 70–99)
Glucose-Capillary: 93 mg/dL (ref 70–99)

## 2022-08-04 SURGERY — CARDIOVERSION
Anesthesia: Monitor Anesthesia Care

## 2022-08-04 MED ORDER — AMIODARONE HCL 200 MG PO TABS
200.0000 mg | ORAL_TABLET | Freq: Two times a day (BID) | ORAL | Status: DC
  Administered 2022-08-04: 200 mg via ORAL
  Filled 2022-08-04: qty 1

## 2022-08-04 MED ORDER — HEPARIN SOD (PORK) LOCK FLUSH 100 UNIT/ML IV SOLN
500.0000 [IU] | Freq: Once | INTRAVENOUS | Status: AC
  Administered 2022-08-04: 500 [IU] via INTRAVENOUS
  Filled 2022-08-04: qty 5

## 2022-08-04 MED ORDER — AMIODARONE HCL 200 MG PO TABS: 200.0000 mg | ORAL_TABLET | Freq: Two times a day (BID) | ORAL | 1 refills | Status: DC

## 2022-08-04 MED ORDER — RIVAROXABAN 20 MG PO TABS: 20.0000 mg | ORAL_TABLET | Freq: Every day | ORAL | Status: DC

## 2022-08-04 MED ORDER — RIVAROXABAN 20 MG PO TABS: 20.0000 mg | ORAL_TABLET | Freq: Every day | ORAL | 1 refills | Status: DC

## 2022-08-04 NOTE — Discharge Summary (Signed)
Physician Discharge Summary   Patient: James Miles MRN: [id number] DOB: 29-Oct-1945  Admit date:     08/02/2022  Discharge date: 08/04/22  Discharge Physician: Onalee Hua Marivel Mcclarty   PCP: Ponciano Ort, [facility]   Recommendations at discharge:   Please follow up with primary care provider within 1-2 weeks  Please repeat BMP and CBC in one week Follow up [facility] 08/07/22 at 7:45 am 4.    Follow up electrophysiology cardiology 08/09/22    Hospital Course: 77 year old male with a history of stage II rectal adenocarcinoma, COPD, diabetes mellitus type 2, hyperlipidemia, atrial fibrillation, and chronic respiratory failure on 2 L presenting with shortness of breath and atrial flutter. Notably, the patient had a recent hospital admission from 07/07/2022 to 07/10/2022 for atrial flutter with RVR.  Patient initially was on diltiazem drip, but this resulted in hypotension.  The patient was transitioned to amiodarone drip.  He ultimately converted to sinus rhythm and was discharged home with amiodarone 200 mg twice daily for a week followed by 200 mg daily.  30-day event monitor was ordered, but the patient never placed it on.  The patient went back into atrial flutter with RVR as noted on a clinic note in the cancer center on 07/21/2022.  He refused ED evaluation at that time.  He followed up in the cardiology clinic on 07/24/2022 at which time he was noted to be in normal sinus rhythm.  Unfortunately, the patient developed some shortness of breath that woke him up around 3 AM on 08/02/2022.  He also has some shortness of breath while changing his clothes in the morning.  He went to the cancer center for his usual chemotherapy.  He was noted to have atrial flutter with RVR.  Subsequently, the patient was sent to the emergency department for further evaluation and treatment. He denies any fevers, chills, chest pain, headache, neck pain, nausea, vomiting, direct abdominal pain.  He endorses compliance  with his amiodarone.  He was only taking his apixaban once daily rather than twice daily.  In the ED, the patient was afebrile and tachycardic.  He was started on amiodarone  150 mg IV Amiodarone load followed by 60 mg/h. He did have hypotension with this and IV dosing was temporarily held and restarted at 30 mg/h. He has also received 1 L of fluids. Initial labs showed WBC 11.1, Hgb 15.7, platelets 162, Na+ 137, K+ 4.1 and creatinine 1.03.  Magnesium 2.3.  CXR with no active disease.  CTA chest negative for PE.  It showed diffuse bronchial thickening with centrilobular and paraseptal emphysema.  Cardiology was consulted to assist with management.  Assessment and Plan: Atrial flutter with RVR -Continue amiodarone drip-continues to have RVR -08/03/22 afternoon converted to sinus and remained in sinus overnight -d/c home with amiodarone 200 mg po bid (previously once daily) -Appreciate cardiology consult -cards planning for possible DCCV -Continue apixaban>>switched to xarelto per cardiology due to pt forgetting to take evening dose   Chronic HFmrEF -Continue Jardiance -There appears to be some signs of fluid overload -received lasix IV x 1 6/26 -BNP 250 -now clinically euvolemic   Chronic respiratory failure with hypoxia -He is supposed to be on 2 L nasal cannula but does not wear it consistently   Uncontrolled diabetes mellitus type 2 with hyperglycemia -07/08/2022 hemoglobin A1c 7.8 -He was on Jardiance as part of GDMT, otherwise not on any active therapy   COPD -Continue bronchodilators as needed   Rectal adenocarcinoma -Diagnosed 04/26/2022 by  colonoscopy -05/11/2022 PET scan negative for metastasis -06/07/2022 FOLFOX started -Follow-up Dr. Ellin Saba 08/07/22 at 0745 AM         Consultants: cardiology Procedures performed: none  Disposition: Home Diet recommendation:  Cardiac diet DISCHARGE MEDICATION: Allergies as of 08/04/2022   No Known Allergies      Medication List      STOP taking these medications    apixaban 5 MG Tabs tablet Commonly known as: ELIQUIS   lisinopril 20 MG tablet Commonly known as: ZESTRIL       TAKE these medications    acidophilus Caps capsule Take 1 capsule by mouth daily.   albuterol 108 (90 Base) MCG/ACT inhaler Commonly known as: VENTOLIN HFA Inhale 2 puffs into the lungs every 6 (six) hours as needed for wheezing or shortness of breath.   allopurinol 300 MG tablet Commonly known as: ZYLOPRIM Take 300 mg by mouth daily.   amiodarone 200 MG tablet Commonly known as: PACERONE Take 1 tablet (200 mg total) by mouth 2 (two) times daily. What changed: when to take this   atorvastatin 20 MG tablet Commonly known as: LIPITOR Take 20 mg by mouth daily.   dextrose 5 % SOLN 1,000 mL with fluorouracil 5 GM/100ML SOLN Inject into the vein.   fenofibrate 54 MG tablet Take 54 mg by mouth daily.   Jardiance 10 MG Tabs tablet Generic drug: empagliflozin Take 25 mg by mouth daily.   LEUCOVORIN CALCIUM IV Inject into the vein.   lidocaine-prilocaine cream Commonly known as: EMLA Apply 1 Application topically as needed. What changed: reasons to take this   loperamide 2 MG capsule Commonly known as: IMODIUM Take 2 mg by mouth 3 (three) times daily as needed for diarrhea or loose stools.   MELATONIN PO Take 1 tablet by mouth at bedtime as needed (sleep).   OXALIPLATIN IV Inject into the vein.   prochlorperazine 10 MG tablet Commonly known as: COMPAZINE Take 1 tablet (10 mg total) by mouth every 6 (six) hours as needed for nausea or vomiting.   rivaroxaban 20 MG Tabs tablet Commonly known as: XARELTO Take 1 tablet (20 mg total) by mouth daily with supper.        Discharge Exam: Filed Weights   08/02/22 1914 08/03/22 0444 08/04/22 0629  Weight: 80.9 kg 80 kg 73.7 kg   HEENT:  /AT, No thrush, no icterus CV:  RRR, no rub, no S3, no S4 Lung:  CTA, no wheeze, no rhonchi Abd:  soft/+BS, NT Ext:   trace LE edema, no lymphangitis, no synovitis, no rash   Condition at discharge: stable  The results of significant diagnostics from this hospitalization (including imaging, microbiology, ancillary and laboratory) are listed below for reference.   Imaging Studies: CT Angio Chest PE W and/or Wo Contrast  Result Date: 08/02/2022 CLINICAL DATA:  77 year old male with clinical suspicion of pulmonary embolism. History of rectal carcinoma. * Tracking Code: BO * EXAM: CT ANGIOGRAPHY CHEST WITH CONTRAST TECHNIQUE: Multidetector CT imaging of the chest was performed using the standard protocol during bolus administration of intravenous contrast. Multiplanar CT image reconstructions and MIPs were obtained to evaluate the vascular anatomy. RADIATION DOSE REDUCTION: This exam was performed according to the departmental dose-optimization program which includes automated exposure control, adjustment of the mA and/or kV according to patient size and/or use of iterative reconstruction technique. CONTRAST:  75mL OMNIPAQUE IOHEXOL 350 MG/ML SOLN COMPARISON:  PET-CT 05/11/2022. FINDINGS: Cardiovascular: No filling defects are noted in the pulmonary arterial tree to suggest pulmonary  embolism. Heart size is normal. There is no significant pericardial fluid, thickening or pericardial calcification. There is aortic atherosclerosis, as well as atherosclerosis of the great vessels of the mediastinum and the coronary arteries, including calcified atherosclerotic plaque in the left main, left anterior descending and left circumflex coronary arteries. Right internal jugular single-lumen Port-A-Cath with tip terminating at the superior cavoatrial junction. Mediastinum/Nodes: No pathologically enlarged mediastinal or hilar lymph nodes. Esophagus is unremarkable in appearance. No axillary lymphadenopathy. Lungs/Pleura: No acute consolidative airspace disease. No pleural effusions. No definite suspicious appearing pulmonary nodules or  masses are noted. Diffuse bronchial wall thickening with moderate centrilobular and paraseptal emphysema. Azygous lobe (normal anatomical variant) incidentally noted. Upper Abdomen: Unremarkable. Musculoskeletal: There are no aggressive appearing lytic or blastic lesions noted in the visualized portions of the skeleton. Review of the MIP images confirms the above findings. IMPRESSION: 1. No acute findings in the thorax. Specifically, no evidence of pulmonary embolism. 2. No evidence of metastatic disease to the thorax. 3. Diffuse bronchial wall thickening with moderate centrilobular and paraseptal emphysema; imaging findings suggestive of underlying COPD. 4. Aortic atherosclerosis, in addition to left main and 2 vessel coronary artery disease. Assessment for potential risk factor modification, dietary therapy or pharmacologic therapy may be warranted, if clinically indicated. Aortic Atherosclerosis (ICD10-I70.0) and Emphysema (ICD10-J43.9). Electronically Signed   By: Trudie Reed M.D.   On: 08/02/2022 12:22   DG Chest Portable 1 View  Result Date: 08/02/2022 CLINICAL DATA:  Palpitations.  Tachycardia. EXAM: PORTABLE CHEST 1 VIEW COMPARISON:  10/03/2005 FINDINGS: Power port in place with the tip in the SVC above the right atrium. Heart size is normal. Mediastinal shadows are normal. The lungs are clear. No edema, infiltrate, collapse or effusion. IMPRESSION: No active disease. Power port in place. Electronically Signed   By: Paulina Fusi M.D.   On: 08/02/2022 11:02   ECHOCARDIOGRAM COMPLETE  Result Date: 07/08/2022    ECHOCARDIOGRAM REPORT   Patient Name:   [name] Date of Exam: 07/08/2022 Medical Rec #:  [id number]       Height:       71.0 in Accession #:    [id number]      Weight:       180.8 lb Date of Birth:  [birth year]       BSA:          2.020 m Patient Age:    77 years        BP:           93/66 mmHg Patient Gender: M               HR:           89 bpm. [facility]am [facility] Procedure: 2D  Echo, Intracardiac Opacification Agent, Color Doppler and Limited            Color Doppler Indications:    atrial flutter  History:        Patient has no prior history of Echocardiogram examinations.                 COPD and Cancer; Risk Factors:Diabetes and Dyslipidemia.  Sonographer:    Delcie Roch RDCS Referring Phys: [id number] PRATIK D [facility]  Sonographer Comments: Technically difficult study due to poor echo windows. Image acquisition challenging due to respiratory motion. IMPRESSIONS  1. Afib. Left ventricular ejection fraction, by estimation, is 40 to 45%. The left ventricle has mildly decreased function. The left ventricle demonstrates global hypokinesis. There is  mild left ventricular hypertrophy. Left ventricular diastolic parameters are indeterminate.  2. Right ventricular systolic function is normal. The right ventricular size is normal. There is normal pulmonary artery systolic pressure. The estimated right ventricular systolic pressure is 24.0 mmHg.  3. Left atrial size was mildly dilated.  4. The mitral valve is normal in structure. No evidence of mitral valve regurgitation. No evidence of mitral stenosis.  5. The aortic valve is normal in structure. Aortic valve regurgitation is not visualized. No aortic stenosis is present.  6. The inferior vena cava is dilated in size with >50% respiratory variability, suggesting right atrial pressure of 8 mmHg. FINDINGS  Left Ventricle: Afib. Left ventricular ejection fraction, by estimation, is 40 to 45%. The left ventricle has mildly decreased function. The left ventricle demonstrates global hypokinesis. The left ventricular internal cavity size was normal in size. There is mild left ventricular hypertrophy. Left ventricular diastolic parameters are indeterminate. Right Ventricle: The right ventricular size is normal. No increase in right ventricular wall thickness. Right ventricular systolic function is normal. There is normal pulmonary artery systolic  pressure. The tricuspid regurgitant velocity is 2.29 m/s, and  with an assumed right atrial pressure of 3 mmHg, the estimated right ventricular systolic pressure is 24.0 mmHg. Left Atrium: Left atrial size was mildly dilated. Right Atrium: Right atrial size was normal in size. Pericardium: There is no evidence of pericardial effusion. Mitral Valve: The mitral valve is normal in structure. No evidence of mitral valve regurgitation. No evidence of mitral valve stenosis. Tricuspid Valve: The tricuspid valve is normal in structure. Tricuspid valve regurgitation is trivial. No evidence of tricuspid stenosis. Aortic Valve: The aortic valve is normal in structure. Aortic valve regurgitation is not visualized. No aortic stenosis is present. Pulmonic Valve: The pulmonic valve was normal in structure. Pulmonic valve regurgitation is not visualized. No evidence of pulmonic stenosis. Aorta: The aortic root is normal in size and structure. Venous: The inferior vena cava is dilated in size with greater than 50% respiratory variability, suggesting right atrial pressure of 8 mmHg. IAS/Shunts: No atrial level shunt detected by color flow Doppler.  LEFT VENTRICLE PLAX 2D LVIDd:         5.20 cm LVIDs:         4.20 cm LV PW:         1.10 cm LV IVS:        1.20 cm LVOT diam:     2.20 cm LVOT Area:     3.80 cm  IVC IVC diam: 2.40 cm LEFT ATRIUM           Index LA diam:      4.10 cm 2.03 cm/m LA Vol (A4C): 58.7 ml 29.06 ml/m   AORTA Ao Root diam: 3.30 cm TRICUSPID VALVE TR Peak grad:   21.0 mmHg TR Vmax:        229.00 cm/s  SHUNTS Systemic Diam: 2.20 cm Donato Schultz MD Electronically signed by Donato Schultz MD Signature Date/Time: 07/08/2022/5:56:32 PM    Final     Microbiology: Results for orders placed or performed during the hospital encounter of 08/02/22  MRSA Next Gen by PCR, Nasal     Status: None   Collection Time: 08/02/22  5:20 PM   Specimen: Nasal Mucosa; Nasal Swab  Result Value Ref Range Status   MRSA by PCR Next Gen NOT  DETECTED NOT DETECTED Final    Comment: (NOTE) The GeneXpert MRSA Assay (FDA approved for NASAL specimens only), is one component of a  comprehensive MRSA colonization surveillance program. It is not intended to diagnose MRSA infection nor to guide or monitor treatment for MRSA infections. Test performance is not FDA approved in patients less than 21 years old. Performed at [facility], [address], [place], [place] [address]     Labs: CBC: Recent Labs  Lab 08/02/22 0802  WBC 11.1*  NEUTROABS 5.9  HGB 15.7  HCT 47.6  MCV 94.6  PLT 162   Basic Metabolic Panel: Recent Labs  Lab 08/02/22 0802 08/03/22 0429  NA 137 137  K 4.1 4.3  CL 104 104  CO2 23 22  GLUCOSE 132* 138*  BUN 18 22  CREATININE 1.03 1.09  CALCIUM 8.8* 8.9  MG 2.3 2.2   Liver Function Tests: Recent Labs  Lab 08/02/22 0802  AST 18  ALT 20  ALKPHOS 64  BILITOT 0.7  PROT 7.3  ALBUMIN 4.2   CBG: Recent Labs  Lab 08/03/22 0723 08/03/22 1136 08/03/22 1611 08/03/22 2136 08/04/22 0755  GLUCAP 136* 139* 115* 134* 144*    Discharge time spent: greater than 30 minutes.  Signed: Catarina Hartshorn, MD [facility] 08/04/2022

## 2022-08-04 NOTE — Progress Notes (Addendum)
Rounding Note    Patient Name: James Miles Date of Encounter: 08/04/2022  [facility] Cardiologist: Charlton Haws, MD   Subjective   Much improved this morning. No chest pain or palpitations. Breathing at baseline.   Inpatient Medications    Scheduled Meds:  amiodarone  200 mg Oral BID   apixaban  5 mg Oral BID   atorvastatin  20 mg Oral Daily   Chlorhexidine Gluconate Cloth  6 each Topical Q0600   insulin aspart  0-9 Units Subcutaneous TID WC   Continuous Infusions:   PRN Meds: acetaminophen **OR** acetaminophen, ipratropium, levalbuterol, ondansetron **OR** ondansetron (ZOFRAN) IV   Vital Signs    Vitals:   08/04/22 0451 08/04/22 0629 08/04/22 0700 08/04/22 0819  BP:   127/63   Pulse:   (!) 58   Resp:   19   Temp: 99 F (37.2 C)   98.3 F (36.8 C)  TempSrc: Oral   Oral  SpO2:   98%   Weight:  73.7 kg    Height:        Intake/Output Summary (Last 24 hours) at 08/04/2022 0859 Last data filed at 08/04/2022 0820 Gross per 24 hour  Intake 661.63 ml  Output 1875 ml  Net -1213.37 ml      08/04/2022    6:29 AM 08/03/2022    4:44 AM 08/02/2022    9:27 AM  Last 3 Weights  Weight (lbs) 162 lb 7.7 oz 176 lb 5.9 oz 178 lb 4.8 oz  Weight (kg) 73.7 kg 80 kg 80.876 kg      Telemetry    NSR, HR in 50's to 60's.  - Personally Reviewed  ECG    NSR, HR 64 with no acute ST changes.  - Personally Reviewed  Physical Exam   GEN: Pleasant male appearing in no acute distress.   Neck: No JVD Cardiac: RRR, no murmurs, rubs, or gallops.  Respiratory: Clear to auscultation bilaterally. GI: Soft, nontender, non-distended  MS: No pitting edema; No deformity. Neuro:  Nonfocal  Psych: Normal affect   Labs    High Sensitivity Troponin:   Recent Labs  Lab 07/07/22 1610  TROPONINIHS 11     Chemistry Recent Labs  Lab 08/02/22 0802 08/03/22 0429  NA 137 137  K 4.1 4.3  CL 104 104  CO2 23 22  GLUCOSE 132* 138*  BUN 18 22  CREATININE 1.03 1.09   CALCIUM 8.8* 8.9  MG 2.3 2.2  PROT 7.3  --   ALBUMIN 4.2  --   AST 18  --   ALT 20  --   ALKPHOS 64  --   BILITOT 0.7  --   GFRNONAA >60 >60  ANIONGAP 10 11    Lipids No results for input(s): "CHOL", "TRIG", "HDL", "LABVLDL", "LDLCALC", "CHOLHDL" in the last 168 hours.  Hematology Recent Labs  Lab 08/02/22 0802  WBC 11.1*  RBC 5.03  HGB 15.7  HCT 47.6  MCV 94.6  MCH 31.2  MCHC 33.0  RDW 16.5*  PLT 162   Thyroid No results for input(s): "TSH", "FREET4" in the last 168 hours.  BNP Recent Labs  Lab 08/02/22 0942  BNP 250.0*    DDimer No results for input(s): "DDIMER" in the last 168 hours.   Radiology    CT Angio Chest PE W and/or Wo Contrast  Result Date: 08/02/2022 CLINICAL DATA:  76 year old male with clinical suspicion of pulmonary embolism. History of rectal carcinoma. * Tracking Code: BO * EXAM: CT  ANGIOGRAPHY CHEST WITH CONTRAST TECHNIQUE: Multidetector CT imaging of the chest was performed using the standard protocol during bolus administration of intravenous contrast. Multiplanar CT image reconstructions and MIPs were obtained to evaluate the vascular anatomy. RADIATION DOSE REDUCTION: This exam was performed according to the departmental dose-optimization program which includes automated exposure control, adjustment of the mA and/or kV according to patient size and/or use of iterative reconstruction technique. CONTRAST:  75mL OMNIPAQUE IOHEXOL 350 MG/ML SOLN COMPARISON:  PET-CT 05/11/2022. FINDINGS: Cardiovascular: No filling defects are noted in the pulmonary arterial tree to suggest pulmonary embolism. Heart size is normal. There is no significant pericardial fluid, thickening or pericardial calcification. There is aortic atherosclerosis, as well as atherosclerosis of the great vessels of the mediastinum and the coronary arteries, including calcified atherosclerotic plaque in the left main, left anterior descending and left circumflex coronary arteries. Right  internal jugular single-lumen Port-A-Cath with tip terminating at the superior cavoatrial junction. Mediastinum/Nodes: No pathologically enlarged mediastinal or hilar lymph nodes. Esophagus is unremarkable in appearance. No axillary lymphadenopathy. Lungs/Pleura: No acute consolidative airspace disease. No pleural effusions. No definite suspicious appearing pulmonary nodules or masses are noted. Diffuse bronchial wall thickening with moderate centrilobular and paraseptal emphysema. Azygous lobe (normal anatomical variant) incidentally noted. Upper Abdomen: Unremarkable. Musculoskeletal: There are no aggressive appearing lytic or blastic lesions noted in the visualized portions of the skeleton. Review of the MIP images confirms the above findings. IMPRESSION: 1. No acute findings in the thorax. Specifically, no evidence of pulmonary embolism. 2. No evidence of metastatic disease to the thorax. 3. Diffuse bronchial wall thickening with moderate centrilobular and paraseptal emphysema; imaging findings suggestive of underlying COPD. 4. Aortic atherosclerosis, in addition to left main and 2 vessel coronary artery disease. Assessment for potential risk factor modification, dietary therapy or pharmacologic therapy may be warranted, if clinically indicated. Aortic Atherosclerosis (ICD10-I70.0) and Emphysema (ICD10-J43.9). Electronically Signed   By: Trudie Reed M.D.   On: 08/02/2022 12:22   DG Chest Portable 1 View  Result Date: 08/02/2022 CLINICAL DATA:  Palpitations.  Tachycardia. EXAM: PORTABLE CHEST 1 VIEW COMPARISON:  10/03/2005 FINDINGS: Power port in place with the tip in the SVC above the right atrium. Heart size is normal. Mediastinal shadows are normal. The lungs are clear. No edema, infiltrate, collapse or effusion. IMPRESSION: No active disease. Power port in place. Electronically Signed   By: Paulina Fusi M.D.   On: 08/02/2022 11:02    Cardiac Studies   Echocardiogram: 07/2022 IMPRESSIONS      1. Afib. Left ventricular ejection fraction, by estimation, is 40 to 45%.  The left ventricle has mildly decreased function. The left ventricle  demonstrates global hypokinesis. There is mild left ventricular  hypertrophy. Left ventricular diastolic  parameters are indeterminate.   2. Right ventricular systolic function is normal. The right ventricular  size is normal. There is normal pulmonary artery systolic pressure. The  estimated right ventricular systolic pressure is 24.0 mmHg.   3. Left atrial size was mildly dilated.   4. The mitral valve is normal in structure. No evidence of mitral valve  regurgitation. No evidence of mitral stenosis.   5. The aortic valve is normal in structure. Aortic valve regurgitation is  not visualized. No aortic stenosis is present.   6. The inferior vena cava is dilated in size with >50% respiratory  variability, suggesting right atrial pressure of 8 mmHg.   Patient Profile     77 y.o. male w/ PMH of recently diagnosed atrial flutter (diagnosed in  06/2022), HFmrEF, COPD, Type 2 DM and Stage 2 rectal adenocarcinoma who is currently admitted with recurrent atrial flutter with RVR.   Assessment & Plan    1. Atrial Flutter with RVR - He did convert back to NSR yesterday morning and is maintaining NSR. HR has been in the 50's to 60's so would not add Bisoprolol at this time. He is currently on IV Amiodarone and will switch to PO 200mg  BID but will verify PO dosing with Dr. Jenene Slicker prior to discharge as he was on 200mg  daily prior to admission. He does have a previously scheduled visit with EP next week to discuss ablation.  - Reviewed with the patient and he prefers to remain on Eliquis at this time instead of switching to Xarelto. He is aware of the importance of taking this twice daily.   2. HFmrEF - EF was at 40-45% by most recent echo and likely tachycardia-mediated. He appears euvolemic on examination. Unable to add Bisoprolol given HR in the 50's. BP  has not allowed for ACE-I/ARB/ARNI/MRA. Would restart Jardiance at discharge.   3. COPD - Uses supplemental oxygen as needed at home. On 2L [place].   4. Rectal Adenocarcinoma - Followed by Oncology. Since his last chemotherapy session was cancelled due to his tachycardia, he is unsure when his next treatment would be. I will reach out to Oncology in regards to this.   For questions or updates, please contact [facility] Please consult [email or web address] for contact info under        Signed, James Lennox, PA-C  08/04/2022, 8:59 AM     Attending attestation  No acute events overnight, patient converted to NSR yesterday morning and has been maintaining normal sinus rhythm since then. Currently on amiodarone drip.  Will discontinue amiodarone drip and increase home dose of p.o. amiodarone from 200 mg once daily to twice daily. Continue systemic AC, will switch Eliquis to Xarelto 20 mg daily with dinner for compliance. Physical exam is remarkable for patient not in acute respiratory distress, HEENT normal, no JVD, S1-S2 normal, clear lungs, abdomen NT and ND, no edema in lower extremities.  [facility] will sign off.   Medication Recommendations: P.o. amiodarone 200 mg twice daily (increased frequency from once daily to twice daily), switch Eliquis to Xarelto 20 mg daily with dinner. Other recommendations (labs, testing, etc): None Follow up as an outpatient: EP follow-up next week   James Alviar Verne Spurr, MD [place]  [facility]  9:51 AM

## 2022-08-04 NOTE — Care Management Important Message (Signed)
Important Message  Patient Details  Name: James Miles MRN: [id number] Date of Birth: Jun 23, 1945   Medicare Important Message Given:  Yes     Corey Harold 08/04/2022, 12:06 PM

## 2022-08-06 NOTE — Progress Notes (Signed)
[facility] [address], [place] [address]    Clinic Day:  08/07/2022  Referring physician: Ponciano Ort [name]  Patient Care Team: [facility] as PCP - General Wendall Stade, MD as PCP - Cardiology (Cardiology) Therese Sarah, RN as Oncology Nurse Navigator (Medical Oncology) Doreatha Massed, MD as Medical Oncologist (Medical Oncology)   ASSESSMENT & PLAN:   Assessment: 1.  Stage II (T3 N0 M0) low rectal adenocarcinoma, MMR preserved: - 44-month history of intermittent rectal bleeding.  No weight loss. - Colonoscopy (04/26/2022): Large cauliflower mass palpated in the distal rectum on DRE.  Scattered diverticula in the descending colon.  5 mm polyp in the ascending colon.  In the rectum, exophytic semilunar neoplastic appearing process beginning at the anal verge and extending proximally about 6 cm. - Pathology (04/26/2022): Invasive moderately differentiated adenocarcinoma of the rectal mass.  MMR preserved. - CT CAP (05/01/2022): Nodular wall thickening along the rectum.  No intrinsic abnormal lymph nodes.  No liver lesion.  Fatty liver.  Nodule along the left side of the urinary bladder.  Small mass along the course of the distal left ureter.  Worrisome for multifocal TCC.  Small lung nodules measuring up to 4 mm. - MRI pelvis (05/03/2022): 6.4 cm left lateral low rectal tumor abutting the internal anal sphincter, T3c N0.  Distance from tumor to the internal anal sphincter is 0 cm.  Tumor measures 6.4 cm in length and up to 2.2 cm in thickness. - PET scan (05/11/2022): No evidence of metastatic disease. - Met with Dr. Maisie Fus on 05/16/2022: She is in agreement with TNT and has discussed APR with colostomy. - TNT: FOLFOX cycle 1 started on 06/07/2022   2.  Social/family history: -He lives at home and is independent of ADLs and IADLs.  He is accompanied by his son today.  Worked at [employer] prior to retirement.  Also served in  [place] but denied any exposure to agent orange.  Quit smoking 1 year ago.  Smoked 1 to 2 packs/day for the last 61 years.  Started at age 18. - No family history of malignancies.    Plan: 1.  Stage II (T3 cN0 M0) low rectal adenocarcinoma, MMR preserved: - He has completed 4 cycles of chemotherapy on 07/19/2022. - He had a couple of admissions with atrial flutter and RVR. - He does not report any chemotherapy side effects today.  No neuropathy side effects. - Reviewed labs today: Normal LFTs and creatinine.  CBC grossly normal. - Will proceed with cycle 5 today.  Will dose reduce by 20% due to recent hospitalizations. - RTC 2 weeks for follow-up.   2.  Left urinary bladder mass and distal left ureteral mass: - Office cystoscopy by Dr. Ronne Binning showed 3 cm left lateral wall bladder mass. - She will be scheduled for TURBT/biopsy after he completes chemotherapy.  3.  A-flutter with RVR: - Recent hospitalization records reviewed. - Continue Xarelto 20 mg daily and amiodarone 200 mg twice daily. - He has an appointment with EP for possible ablation.    No orders of the defined types were placed in this encounter.     I,Katie Daubenspeck,acting as a Neurosurgeon for Doreatha Massed, MD.,have documented all relevant documentation on the behalf of Doreatha Massed, MD,as directed by  Doreatha Massed, MD while in the presence of Doreatha Massed, MD.   I, Doreatha Massed MD, have reviewed the above documentation for accuracy and completeness, and I agree  with the above.   Doreatha Massed, MD   7/1/20245:11 PM  CHIEF COMPLAINT:   Diagnosis: Rectal adenocarcinoma    Cancer Staging  Rectal adenocarcinoma [facility]) Staging form: Colon and Rectum, AJCC 8th Edition - Clinical stage from 05/07/2022: Stage IIA (cT3, cN0, cM0) - Unsigned    Prior Therapy: none  Current Therapy:  Concurrent chemoradiation with FOLFOX, to be followed by surgery    HISTORY OF PRESENT  ILLNESS:   Oncology History  Rectal adenocarcinoma (HCC)  05/07/2022 Initial Diagnosis   Rectal adenocarcinoma (HCC)   06/07/2022 -  Chemotherapy   Patient is on Treatment Plan : COLORECTAL FOLFOX q14d x 4 months        INTERVAL HISTORY:   James Miles is a 77 y.o. male presenting to clinic today for follow up of Rectal adenocarcinoma. He was last seen by me on 08/02/22.  Today, he states that he is doing well overall. His appetite level is at 100%. His energy level is at 10%.  PAST MEDICAL HISTORY:   Past Medical History: Past Medical History:  Diagnosis Date   Back pain    COPD (chronic obstructive pulmonary disease) (HCC)    Diabetes mellitus without complication (HCC)    Hyperlipidemia     Surgical History: Past Surgical History:  Procedure Laterality Date   BIOPSY  04/26/2022   Procedure: BIOPSY;  Surgeon: Corbin Ade, MD;  [facility];  Service: Endoscopy;;   COLONOSCOPY WITH PROPOFOL N/A 04/26/2022   Procedure: COLONOSCOPY WITH PROPOFOL;  Surgeon: Corbin Ade, MD;  Location: [facility];  Service: Endoscopy;  Laterality: N/A;  10:00am; ASA 3   hemorhoidectomy     IR IMAGING GUIDED PORT INSERTION  05/12/2022   POLYPECTOMY  04/26/2022   Procedure: POLYPECTOMY;  Surgeon: Corbin Ade, MD;  Location: [facility];  Service: Endoscopy;;    Social History: Social History   Socioeconomic History   Marital status: Widowed    Spouse name: Not on file   Number of children: 2   Years of education: Not on file   Highest education level: Not on file  Occupational History   Not on file  Tobacco Use   Smoking status: Former    Years: 50    Types: Cigarettes    Quit date: 08/2016    Years since quitting: 6.0   Smokeless tobacco: Never  Vaping Use   Vaping Use: Never used  Substance and Sexual Activity   Alcohol use: Never   Drug use: Never   Sexual activity: Not Currently  Other Topics Concern   Not on file  Social History Narrative   Not on file    Social Determinants of Health   Financial Resource Strain: Not on file  Food Insecurity: No Food Insecurity (07/07/2022)   Hunger Vital Sign    Worried About Running Out of Food in the Last Year: Never true    Ran Out of Food in the Last Year: Never true  Transportation Needs: No Transportation Needs (07/07/2022)   PRAPARE - Administrator, Civil Service (Medical): No    Lack of Transportation (Non-Medical): No  Physical Activity: Not on file  Stress: Not on file  Social Connections: Not on file  Intimate Partner Violence: Not At Risk (07/07/2022)   Humiliation, Afraid, Rape, and Kick questionnaire    Fear of Current or Ex-Partner: No    Emotionally Abused: No    Physically Abused: No    Sexually Abused: No  Family History: History reviewed. No pertinent family history.  Current Medications:  Current Outpatient Medications:    acidophilus (RISAQUAD) CAPS capsule, Take 1 capsule by mouth daily., Disp: , Rfl:    albuterol (VENTOLIN HFA) 108 (90 Base) MCG/ACT inhaler, Inhale 2 puffs into the lungs every 6 (six) hours as needed for wheezing or shortness of breath., Disp: , Rfl:    allopurinol (ZYLOPRIM) 300 MG tablet, Take 300 mg by mouth daily., Disp: , Rfl:    amiodarone (PACERONE) 200 MG tablet, Take 1 tablet (200 mg total) by mouth 2 (two) times daily., Disp: 60 tablet, Rfl: 1   atorvastatin (LIPITOR) 20 MG tablet, Take 20 mg by mouth daily., Disp: , Rfl:    dextrose 5 % SOLN 1,000 mL with fluorouracil 5 GM/100ML SOLN, Inject into the vein., Disp: , Rfl:    fenofibrate 54 MG tablet, Take 54 mg by mouth daily., Disp: , Rfl:    JARDIANCE 10 MG TABS tablet, Take 25 mg by mouth daily., Disp: , Rfl:    LEUCOVORIN CALCIUM IV, Inject into the vein., Disp: , Rfl:    lidocaine-prilocaine (EMLA) cream, Apply 1 Application topically as needed. (Patient taking differently: Apply 1 Application topically as needed (PORT).), Disp: 30 g, Rfl: 0   loperamide (IMODIUM) 2 MG capsule,  Take 2 mg by mouth 3 (three) times daily as needed for diarrhea or loose stools., Disp: , Rfl:    MELATONIN PO, Take 1 tablet by mouth at bedtime as needed (sleep)., Disp: , Rfl:    Misc. Devices MISC, Please provide with shower chair, Disp: 1 Units, Rfl: 0   OXALIPLATIN IV, Inject into the vein., Disp: , Rfl:    prochlorperazine (COMPAZINE) 10 MG tablet, Take 1 tablet (10 mg total) by mouth every 6 (six) hours as needed for nausea or vomiting., Disp: 30 tablet, Rfl: 2   rivaroxaban (XARELTO) 20 MG TABS tablet, Take 1 tablet (20 mg total) by mouth daily with supper., Disp: 30 tablet, Rfl: 1 No current facility-administered medications for this visit.  Facility-Administered Medications Ordered in Other Visits:    fluorouracil (ADRUCIL) 3,500 mg in sodium chloride 0.9 % 80 mL chemo infusion, 1,920 mg/m2 (Treatment Plan Recorded), Intravenous, 1 day or 1 dose, Doreatha Massed, MD, Infusion Verify at 08/07/22 1425   Allergies: No Known Allergies  REVIEW OF SYSTEMS:   Review of Systems  Constitutional:  Positive for fatigue. Negative for chills and fever.  HENT:   Negative for lump/mass, mouth sores, nosebleeds, sore throat and trouble swallowing.   Eyes:  Negative for eye problems.  Respiratory:  Positive for shortness of breath. Negative for cough.   Cardiovascular:  Negative for chest pain, leg swelling and palpitations.  Gastrointestinal:  Negative for abdominal pain, constipation, diarrhea, nausea and vomiting.  Genitourinary:  Negative for bladder incontinence, difficulty urinating, dysuria, frequency, hematuria and nocturia.   Musculoskeletal:  Negative for arthralgias, back pain, flank pain, myalgias and neck pain.  Skin:  Negative for itching and rash.  Neurological:  Negative for dizziness, headaches and numbness.  Hematological:  Does not bruise/bleed easily.  Psychiatric/Behavioral:  Negative for depression, sleep disturbance and suicidal ideas. The patient is not  nervous/anxious.   All other systems reviewed and are negative.    VITALS:   Pulse (!) 131, weight 167 lb (75.8 kg).  Wt Readings from Last 3 Encounters:  08/07/22 167 lb (75.8 kg)  08/04/22 162 lb 7.7 oz (73.7 kg)  08/02/22 178 lb 4.8 oz (80.9 kg)  Body mass index is 23.29 kg/m.  Performance status (ECOG): 1 - Symptomatic but completely ambulatory  PHYSICAL EXAM:   Physical Exam Vitals and nursing note reviewed. Exam conducted with a chaperone present.  Constitutional:      Appearance: Normal appearance.  Cardiovascular:     Rate and Rhythm: Normal rate and regular rhythm.     Pulses: Normal pulses.     Heart sounds: Normal heart sounds.  Pulmonary:     Effort: Pulmonary effort is normal.     Breath sounds: Normal breath sounds.  Abdominal:     Palpations: Abdomen is soft. There is no hepatomegaly, splenomegaly or mass.     Tenderness: There is no abdominal tenderness.  Musculoskeletal:     Right lower leg: No edema.     Left lower leg: No edema.  Lymphadenopathy:     Cervical: No cervical adenopathy.     Right cervical: No superficial, deep or posterior cervical adenopathy.    Left cervical: No superficial, deep or posterior cervical adenopathy.     Upper Body:     Right upper body: No supraclavicular or axillary adenopathy.     Left upper body: No supraclavicular or axillary adenopathy.  Neurological:     General: No focal deficit present.     Mental Status: He is alert and oriented to person, place, and time.  Psychiatric:        Mood and Affect: Mood normal.        Behavior: Behavior normal.     LABS:      Latest Ref Rng & Units 08/07/2022    7:45 AM 08/02/2022    8:02 AM 07/19/2022    8:15 AM  CBC  WBC 4.0 - 10.5 K/uL 7.1  11.1  7.0   Hemoglobin 13.0 - 17.0 g/dL 16.1  09.6  04.5   Hematocrit 39.0 - 52.0 % 45.2  47.6  46.2   Platelets 150 - 400 K/uL 201  162  201       Latest Ref Rng & Units 08/07/2022    7:45 AM 08/03/2022    4:29 AM 08/02/2022     8:02 AM  CMP  Glucose 70 - 99 mg/dL 409  811  914   BUN 8 - 23 mg/dL 13  22  18    Creatinine 0.61 - 1.24 mg/dL 7.82  9.56  2.13   Sodium 135 - 145 mmol/L 138  137  137   Potassium 3.5 - 5.1 mmol/L 4.3  4.3  4.1   Chloride 98 - 111 mmol/L 102  104  104   CO2 22 - 32 mmol/L 26  22  23    Calcium 8.9 - 10.3 mg/dL 8.7  8.9  8.8   Total Protein 6.5 - 8.1 g/dL 7.1   7.3   Total Bilirubin 0.3 - 1.2 mg/dL 0.7   0.7   Alkaline Phos 38 - 126 U/L 68   64   AST 15 - 41 U/L 14   18   ALT 0 - 44 U/L 16   20      Lab Results  Component Value Date   CEA1 2.6 04/26/2022   /  CEA  Date Value Ref Range Status  04/26/2022 2.6 0.0 - 4.7 ng/mL Final    Comment:    (NOTE)                             Nonsmokers          <  3.9                             Smokers             <5.6 [employer] Electrochemiluminescence Immunoassay (ECLIA) Values obtained with different assay methods or kits cannot be used interchangeably.  Results cannot be interpreted as absolute evidence of the presence or absence of malignant disease. Performed At: [facility] [address] [place], [place] [id number] Jolene Schimke MD WU:[id number]    No results found for: "PSA1" No results found for: [phone]" No results found for: "CAN125"  No results found for: "TOTALPROTELP", "ALBUMINELP", "A1GS", "A2GS", "BETS", "BETA2SER", "GAMS", "MSPIKE", "SPEI" No results found for: "TIBC", "FERRITIN", "IRONPCTSAT" No results found for: "LDH"   STUDIES:   CT Angio Chest PE W and/or Wo Contrast  Result Date: 08/02/2022 CLINICAL DATA:  77 year old male with clinical suspicion of pulmonary embolism. History of rectal carcinoma. * Tracking Code: BO * EXAM: CT ANGIOGRAPHY CHEST WITH CONTRAST TECHNIQUE: Multidetector CT imaging of the chest was performed using the standard protocol during bolus administration of intravenous contrast. Multiplanar CT image reconstructions and MIPs were obtained to evaluate the vascular  anatomy. RADIATION DOSE REDUCTION: This exam was performed according to the departmental dose-optimization program which includes automated exposure control, adjustment of the mA and/or kV according to patient size and/or use of iterative reconstruction technique. CONTRAST:  75mL OMNIPAQUE IOHEXOL 350 MG/ML SOLN COMPARISON:  PET-CT 05/11/2022. FINDINGS: Cardiovascular: No filling defects are noted in the pulmonary arterial tree to suggest pulmonary embolism. Heart size is normal. There is no significant pericardial fluid, thickening or pericardial calcification. There is aortic atherosclerosis, as well as atherosclerosis of the great vessels of the mediastinum and the coronary arteries, including calcified atherosclerotic plaque in the left main, left anterior descending and left circumflex coronary arteries. Right internal jugular single-lumen Port-A-Cath with tip terminating at the superior cavoatrial junction. Mediastinum/Nodes: No pathologically enlarged mediastinal or hilar lymph nodes. Esophagus is unremarkable in appearance. No axillary lymphadenopathy. Lungs/Pleura: No acute consolidative airspace disease. No pleural effusions. No definite suspicious appearing pulmonary nodules or masses are noted. Diffuse bronchial wall thickening with moderate centrilobular and paraseptal emphysema. Azygous lobe (normal anatomical variant) incidentally noted. Upper Abdomen: Unremarkable. Musculoskeletal: There are no aggressive appearing lytic or blastic lesions noted in the visualized portions of the skeleton. Review of the MIP images confirms the above findings. IMPRESSION: 1. No acute findings in the thorax. Specifically, no evidence of pulmonary embolism. 2. No evidence of metastatic disease to the thorax. 3. Diffuse bronchial wall thickening with moderate centrilobular and paraseptal emphysema; imaging findings suggestive of underlying COPD. 4. Aortic atherosclerosis, in addition to left main and 2 vessel coronary  artery disease. Assessment for potential risk factor modification, dietary therapy or pharmacologic therapy may be warranted, if clinically indicated. Aortic Atherosclerosis (ICD10-I70.0) and Emphysema (ICD10-J43.9). Electronically Signed   By: Trudie Reed M.D.   On: 08/02/2022 12:22   DG Chest Portable 1 View  Result Date: 08/02/2022 CLINICAL DATA:  Palpitations.  Tachycardia. EXAM: PORTABLE CHEST 1 VIEW COMPARISON:  10/03/2005 FINDINGS: Power port in place with the tip in the SVC above the right atrium. Heart size is normal. Mediastinal shadows are normal. The lungs are clear. No edema, infiltrate, collapse or effusion. IMPRESSION: No active disease. Power port in place. Electronically Signed   By: Paulina Fusi M.D.   On: 08/02/2022 11:02

## 2022-08-07 ENCOUNTER — Inpatient Hospital Stay (HOSPITAL_BASED_OUTPATIENT_CLINIC_OR_DEPARTMENT_OTHER): Payer: PPO | Admitting: Hematology

## 2022-08-07 ENCOUNTER — Encounter: Payer: Self-pay | Admitting: Hematology

## 2022-08-07 ENCOUNTER — Inpatient Hospital Stay: Payer: PPO

## 2022-08-07 ENCOUNTER — Inpatient Hospital Stay: Payer: PPO | Attending: Hematology

## 2022-08-07 VITALS — HR 131 | Wt 167.0 lb

## 2022-08-07 VITALS — BP 104/68 | HR 58 | Resp 16

## 2022-08-07 DIAGNOSIS — N2889 Other specified disorders of kidney and ureter: Secondary | ICD-10-CM | POA: Diagnosis not present

## 2022-08-07 DIAGNOSIS — R0602 Shortness of breath: Secondary | ICD-10-CM | POA: Diagnosis not present

## 2022-08-07 DIAGNOSIS — C20 Malignant neoplasm of rectum: Secondary | ICD-10-CM

## 2022-08-07 DIAGNOSIS — Z452 Encounter for adjustment and management of vascular access device: Secondary | ICD-10-CM | POA: Insufficient documentation

## 2022-08-07 DIAGNOSIS — I4892 Unspecified atrial flutter: Secondary | ICD-10-CM | POA: Diagnosis not present

## 2022-08-07 DIAGNOSIS — Z7901 Long term (current) use of anticoagulants: Secondary | ICD-10-CM | POA: Insufficient documentation

## 2022-08-07 DIAGNOSIS — Z79899 Other long term (current) drug therapy: Secondary | ICD-10-CM | POA: Diagnosis not present

## 2022-08-07 DIAGNOSIS — Z5111 Encounter for antineoplastic chemotherapy: Secondary | ICD-10-CM | POA: Diagnosis present

## 2022-08-07 DIAGNOSIS — Z87891 Personal history of nicotine dependence: Secondary | ICD-10-CM | POA: Insufficient documentation

## 2022-08-07 LAB — COMPREHENSIVE METABOLIC PANEL
ALT: 16 U/L (ref 0–44)
AST: 14 U/L — ABNORMAL LOW (ref 15–41)
Albumin: 3.8 g/dL (ref 3.5–5.0)
Alkaline Phosphatase: 68 U/L (ref 38–126)
Anion gap: 10 (ref 5–15)
BUN: 13 mg/dL (ref 8–23)
CO2: 26 mmol/L (ref 22–32)
Calcium: 8.7 mg/dL — ABNORMAL LOW (ref 8.9–10.3)
Chloride: 102 mmol/L (ref 98–111)
Creatinine, Ser: 1.08 mg/dL (ref 0.61–1.24)
GFR, Estimated: >60 mL/min (ref >60)
Glucose, Bld: 143 mg/dL — ABNORMAL HIGH (ref 70–99)
Potassium: 4.3 mmol/L (ref 3.5–5.1)
Sodium: 138 mmol/L (ref 135–145)
Total Bilirubin: 0.7 mg/dL (ref 0.3–1.2)
Total Protein: 7.1 g/dL (ref 6.5–8.1)

## 2022-08-07 LAB — MAGNESIUM: Magnesium: 2.2 mg/dL (ref 1.7–2.4)

## 2022-08-07 LAB — CBC WITH DIFFERENTIAL/PLATELET
Abs Immature Granulocytes: 0.04 10*3/uL (ref 0.00–0.07)
Basophils Absolute: 0.1 10*3/uL (ref 0.0–0.1)
Basophils Relative: 1 %
Eosinophils Absolute: 0.1 10*3/uL (ref 0.0–0.5)
Eosinophils Relative: 1 %
HCT: 45.2 % (ref 39.0–52.0)
Hemoglobin: 14.8 g/dL (ref 13.0–17.0)
Immature Granulocytes: 1 %
Lymphocytes Relative: 37 %
Lymphs Abs: 2.6 10*3/uL (ref 0.7–4.0)
MCH: 31.6 pg (ref 26.0–34.0)
MCHC: 32.7 g/dL (ref 30.0–36.0)
MCV: 96.4 fL (ref 80.0–100.0)
Monocytes Absolute: 0.9 10*3/uL (ref 0.1–1.0)
Monocytes Relative: 13 %
Neutro Abs: 3.4 10*3/uL (ref 1.7–7.7)
Neutrophils Relative %: 47 %
Platelets: 201 10*3/uL (ref 150–400)
RBC: 4.69 MIL/uL (ref 4.22–5.81)
RDW: 16.3 % — ABNORMAL HIGH (ref 11.5–15.5)
WBC: 7.1 10*3/uL (ref 4.0–10.5)
nRBC: 0 % (ref 0.0–0.2)

## 2022-08-07 MED ORDER — PALONOSETRON HCL INJECTION 0.25 MG/5ML
0.2500 mg | Freq: Once | INTRAVENOUS | Status: AC
  Administered 2022-08-07: 0.25 mg via INTRAVENOUS
  Filled 2022-08-07: qty 5

## 2022-08-07 MED ORDER — SODIUM CHLORIDE 0.9 % IV SOLN
1920.0000 mg/m2 | INTRAVENOUS | Status: DC
  Administered 2022-08-07: 3500 mg via INTRAVENOUS
  Filled 2022-08-07: qty 70

## 2022-08-07 MED ORDER — MISC. DEVICES MISC: 0 refills | Status: AC

## 2022-08-07 MED ORDER — LEUCOVORIN CALCIUM INJECTION 350 MG
320.0000 mg/m2 | Freq: Once | INTRAVENOUS | Status: AC
  Administered 2022-08-07: 640 mg via INTRAVENOUS
  Filled 2022-08-07: qty 32

## 2022-08-07 MED ORDER — SODIUM CHLORIDE 0.9 % IV SOLN
10.0000 mg | Freq: Once | INTRAVENOUS | Status: AC
  Administered 2022-08-07: 10 mg via INTRAVENOUS
  Filled 2022-08-07: qty 10

## 2022-08-07 MED ORDER — FLUOROURACIL CHEMO INJECTION 2.5 GM/50ML
320.0000 mg/m2 | Freq: Once | INTRAVENOUS | Status: AC
  Administered 2022-08-07: 650 mg via INTRAVENOUS
  Filled 2022-08-07: qty 13

## 2022-08-07 MED ORDER — OXALIPLATIN CHEMO INJECTION 100 MG/20ML
68.0000 mg/m2 | Freq: Once | INTRAVENOUS | Status: AC
  Administered 2022-08-07: 150 mg via INTRAVENOUS
  Filled 2022-08-07: qty 20

## 2022-08-07 MED ORDER — DEXTROSE 5 % IV SOLN: Freq: Once | INTRAVENOUS | Status: AC

## 2022-08-07 NOTE — Progress Notes (Signed)
Patient has been examined by Dr. Ellin Saba. Vital signs and labs have been reviewed by MD - ANC, Creatinine, LFTs, hemoglobin, and platelets are within treatment parameters per M.D. - pt may proceed with treatment, dose-reduced by 20%. Primary RN and pharmacy notified.

## 2022-08-07 NOTE — Patient Instructions (Signed)
[facility]  Discharge Instructions: Thank you for choosing [facility] to provide your oncology and hematology care.  If you have a lab appointment with the Cancer Center - please note that after April 8th, 2024, all labs will be drawn in the cancer center.  You do not have to check in or register with the main entrance as you have in the past but will complete your check-in in the cancer center.  Wear comfortable clothing and clothing appropriate for easy access to any Portacath or PICC line.   We strive to give you quality time with your provider. You may need to reschedule your appointment if you arrive late (15 or more minutes).  Arriving late affects you and other patients whose appointments are after yours.  Also, if you miss three or more appointments without notifying the office, you may be dismissed from the clinic at the provider's discretion.      For prescription refill requests, have your pharmacy contact our office and allow 72 hours for refills to be completed.    Today you received the following chemotherapy and/or immunotherapy agents Oxaliplatin/Leucovoroin/Fluorouracil.  Oxaliplatin Injection What is this medication? OXALIPLATIN (ox AL i PLA tin) treats colorectal cancer. It works by slowing down the growth of cancer cells. This medicine may be used for other purposes; ask your health care provider or pharmacist if you have questions. COMMON BRAND NAME(S): Eloxatin What should I tell my care team before I take this medication? They need to know if you have any of these conditions: Heart disease History of irregular heartbeat or rhythm Liver disease Low blood cell levels (white cells, red cells, and platelets) Lung or breathing disease, such as asthma Take medications that treat or prevent blood clots Tingling of the fingers, toes, or other nerve disorder An unusual or allergic reaction to oxaliplatin, other medications, foods, dyes, or  preservatives If you or your partner are pregnant or trying to get pregnant Breast-feeding How should I use this medication? This medication is injected into a vein. It is given by your care team in a hospital or clinic setting. Talk to your care team about the use of this medication in children. Special care may be needed. Overdosage: If you think you have taken too much of this medicine contact a poison control center or emergency room at once. NOTE: This medicine is only for you. Do not share this medicine with others. What if I miss a dose? Keep appointments for follow-up doses. It is important not to miss a dose. Call your care team if you are unable to keep an appointment. What may interact with this medication? Do not take this medication with any of the following: Cisapride Dronedarone Pimozide Thioridazine This medication may also interact with the following: Aspirin and aspirin-like medications Certain medications that treat or prevent blood clots, such as warfarin, apixaban, dabigatran, and rivaroxaban Cisplatin Cyclosporine Diuretics Medications for infection, such as acyclovir, adefovir, amphotericin B, bacitracin, cidofovir, foscarnet, ganciclovir, gentamicin, pentamidine, vancomycin NSAIDs, medications for pain and inflammation, such as ibuprofen or naproxen Other medications that cause heart rhythm changes Pamidronate Zoledronic acid This list may not describe all possible interactions. Give your health care provider a list of all the medicines, herbs, non-prescription drugs, or dietary supplements you use. Also tell them if you smoke, drink alcohol, or use illegal drugs. Some items may interact with your medicine. What should I watch for while using this medication? Your condition will be monitored carefully while  you are receiving this medication. You may need blood work while taking this medication. This medication may make you feel generally unwell. This is not  uncommon as chemotherapy can affect healthy cells as well as cancer cells. Report any side effects. Continue your course of treatment even though you feel ill unless your care team tells you to stop. This medication may increase your risk of getting an infection. Call your care team for advice if you get a fever, chills, sore throat, or other symptoms of a cold or flu. Do not treat yourself. Try to avoid being around people who are sick. Avoid taking medications that contain aspirin, acetaminophen, ibuprofen, naproxen, or ketoprofen unless instructed by your care team. These medications may hide a fever. Be careful brushing or flossing your teeth or using a toothpick because you may get an infection or bleed more easily. If you have any dental work done, tell your dentist you are receiving this medication. This medication can make you more sensitive to cold. Do not drink cold drinks or use ice. Cover exposed skin before coming in contact with cold temperatures or cold objects. When out in cold weather wear warm clothing and cover your mouth and nose to warm the air that goes into your lungs. Tell your care team if you get sensitive to the cold. Talk to your care team if you or your partner are pregnant or think either of you might be pregnant. This medication can cause serious birth defects if taken during pregnancy and for 9 months after the last dose. A negative pregnancy test is required before starting this medication. A reliable form of contraception is recommended while taking this medication and for 9 months after the last dose. Talk to your care team about effective forms of contraception. Do not father a child while taking this medication and for 6 months after the last dose. Use a condom while having sex during this time period. Do not breastfeed while taking this medication and for 3 months after the last dose. This medication may cause infertility. Talk to your care team if you are concerned about  your fertility. What side effects may I notice from receiving this medication? Side effects that you should report to your care team as soon as possible: Allergic reactions--skin rash, itching, hives, swelling of the face, lips, tongue, or throat Bleeding--bloody or black, tar-like stools, vomiting blood or [place] material that looks like coffee grounds, red or dark Remmi Armenteros urine, small red or purple spots on skin, unusual bruising or bleeding Dry cough, shortness of breath or trouble breathing Heart rhythm changes--fast or irregular heartbeat, dizziness, feeling faint or lightheaded, chest pain, trouble breathing Infection--fever, chills, cough, sore throat, wounds that don't heal, pain or trouble when passing urine, general feeling of discomfort or being unwell Liver injury--right upper belly pain, loss of appetite, nausea, light-colored stool, dark yellow or Yaniris Braddock urine, yellowing skin or eyes, unusual weakness or fatigue Low red blood cell level--unusual weakness or fatigue, dizziness, headache, trouble breathing Muscle injury--unusual weakness or fatigue, muscle pain, dark yellow or Vaishnavi Dalby urine, decrease in amount of urine Pain, tingling, or numbness in the hands or feet Sudden and severe headache, confusion, change in vision, seizures, which may be signs of posterior reversible encephalopathy syndrome (PRES) Unusual bruising or bleeding Side effects that usually do not require medical attention (report to your care team if they continue or are bothersome): Diarrhea Nausea Pain, redness, or swelling with sores inside the mouth or throat Unusual weakness or   fatigue Vomiting This list may not describe all possible side effects. Call your doctor for medical advice about side effects. You may report side effects to FDA at 1-800-FDA-1088. Where should I keep my medication? This medication is given in a hospital or clinic. It will not be stored at home. NOTE: This sheet is a summary. It may not  cover all possible information. If you have questions about this medicine, talk to your doctor, pharmacist, or health care provider.  2024 Elsevier/Gold Standard (2022-04-02 00:00:00)    Leucovorin Injection What is this medication? LEUCOVORIN (loo koe VOR in) prevents side effects from certain medications, such as methotrexate. It works by increasing folate levels. This helps protect healthy cells in your body. It may also be used to treat anemia caused by low levels of folate. It can also be used with fluorouracil, a type of chemotherapy, to treat colorectal cancer. It works by increasing the effects of fluorouracil in the body. This medicine may be used for other purposes; ask your health care provider or pharmacist if you have questions. What should I tell my care team before I take this medication? They need to know if you have any of these conditions: Anemia from low levels of vitamin B12 in the blood An unusual or allergic reaction to leucovorin, folic acid, other medications, foods, dyes, or preservatives Pregnant or trying to get pregnant Breastfeeding How should I use this medication? This medication is injected into a vein or a muscle. It is given by your care team in a hospital or clinic setting. Talk to your care team about the use of this medication in children. Special care may be needed. Overdosage: If you think you have taken too much of this medicine contact a poison control center or emergency room at once. NOTE: This medicine is only for you. Do not share this medicine with others. What if I miss a dose? Keep appointments for follow-up doses. It is important not to miss your dose. Call your care team if you are unable to keep an appointment. What may interact with this medication? Capecitabine Fluorouracil Phenobarbital Phenytoin Primidone Trimethoprim;sulfamethoxazole This list may not describe all possible interactions. Give your health care provider a list of all  the medicines, herbs, non-prescription drugs, or dietary supplements you use. Also tell them if you smoke, drink alcohol, or use illegal drugs. Some items may interact with your medicine. What should I watch for while using this medication? Your condition will be monitored carefully while you are receiving this medication. This medication may increase the side effects of 5-fluorouracil. Tell your care team if you have diarrhea or mouth sores that do not get better or that get worse. What side effects may I notice from receiving this medication? Side effects that you should report to your care team as soon as possible: Allergic reactions--skin rash, itching, hives, swelling of the face, lips, tongue, or throat This list may not describe all possible side effects. Call your doctor for medical advice about side effects. You may report side effects to FDA at 1-800-FDA-1088. Where should I keep my medication? This medication is given in a hospital or clinic. It will not be stored at home. NOTE: This sheet is a summary. It may not cover all possible information. If you have questions about this medicine, talk to your doctor, pharmacist, or health care provider.  2024 Elsevier/Gold Standard (2021-06-28 00:00:00)    Fluorouracil Injection What is this medication? FLUOROURACIL (flure oh YOOR a sil) treats some types  of cancer. It works by slowing down the growth of cancer cells. This medicine may be used for other purposes; ask your health care provider or pharmacist if you have questions. COMMON BRAND NAME(S): Adrucil What should I tell my care team before I take this medication? They need to know if you have any of these conditions: Blood disorders Dihydropyrimidine dehydrogenase (DPD) deficiency Infection, such as chickenpox, cold sores, herpes Kidney disease Liver disease Poor nutrition Recent or ongoing radiation therapy An unusual or allergic reaction to fluorouracil, other medications,  foods, dyes, or preservatives If you or your partner are pregnant or trying to get pregnant Breast-feeding How should I use this medication? This medication is injected into a vein. It is administered by your care team in a hospital or clinic setting. Talk to your care team about the use of this medication in children. Special care may be needed. Overdosage: If you think you have taken too much of this medicine contact a poison control center or emergency room at once. NOTE: This medicine is only for you. Do not share this medicine with others. What if I miss a dose? Keep appointments for follow-up doses. It is important not to miss your dose. Call your care team if you are unable to keep an appointment. What may interact with this medication? Do not take this medication with any of the following: Live virus vaccines This medication may also interact with the following: Medications that treat or prevent blood clots, such as warfarin, enoxaparin, dalteparin This list may not describe all possible interactions. Give your health care provider a list of all the medicines, herbs, non-prescription drugs, or dietary supplements you use. Also tell them if you smoke, drink alcohol, or use illegal drugs. Some items may interact with your medicine. What should I watch for while using this medication? Your condition will be monitored carefully while you are receiving this medication. This medication may make you feel generally unwell. This is not uncommon as chemotherapy can affect healthy cells as well as cancer cells. Report any side effects. Continue your course of treatment even though you feel ill unless your care team tells you to stop. In some cases, you may be given additional medications to help with side effects. Follow all directions for their use. This medication may increase your risk of getting an infection. Call your care team for advice if you get a fever, chills, sore throat, or other  symptoms of a cold or flu. Do not treat yourself. Try to avoid being around people who are sick. This medication may increase your risk to bruise or bleed. Call your care team if you notice any unusual bleeding. Be careful brushing or flossing your teeth or using a toothpick because you may get an infection or bleed more easily. If you have any dental work done, tell your dentist you are receiving this medication. Avoid taking medications that contain aspirin, acetaminophen, ibuprofen, naproxen, or ketoprofen unless instructed by your care team. These medications may hide a fever. Do not treat diarrhea with over the counter products. Contact your care team if you have diarrhea that lasts more than 2 days or if it is severe and watery. This medication can make you more sensitive to the sun. Keep out of the sun. If you cannot avoid being in the sun, wear protective clothing and sunscreen. Do not use sun lamps, tanning beds, or tanning booths. Talk to your care team if you or your partner wish to become pregnant or think  you might be pregnant. This medication can cause serious birth defects if taken during pregnancy and for 3 months after the last dose. A reliable form of contraception is recommended while taking this medication and for 3 months after the last dose. Talk to your care team about effective forms of contraception. Do not father a child while taking this medication and for 3 months after the last dose. Use a condom while having sex during this time period. Do not breastfeed while taking this medication. This medication may cause infertility. Talk to your care team if you are concerned about your fertility. What side effects may I notice from receiving this medication? Side effects that you should report to your care team as soon as possible: Allergic reactions--skin rash, itching, hives, swelling of the face, lips, tongue, or throat Heart attack--pain or tightness in the chest, shoulders, arms,  or jaw, nausea, shortness of breath, cold or clammy skin, feeling faint or lightheaded Heart failure--shortness of breath, swelling of the ankles, feet, or hands, sudden weight gain, unusual weakness or fatigue Heart rhythm changes--fast or irregular heartbeat, dizziness, feeling faint or lightheaded, chest pain, trouble breathing High ammonia level--unusual weakness or fatigue, confusion, loss of appetite, nausea, vomiting, seizures Infection--fever, chills, cough, sore throat, wounds that don't heal, pain or trouble when passing urine, general feeling of discomfort or being unwell Low red blood cell level--unusual weakness or fatigue, dizziness, headache, trouble breathing Pain, tingling, or numbness in the hands or feet, muscle weakness, change in vision, confusion or trouble speaking, loss of balance or coordination, trouble walking, seizures Redness, swelling, and blistering of the skin over hands and feet Severe or prolonged diarrhea Unusual bruising or bleeding Side effects that usually do not require medical attention (report to your care team if they continue or are bothersome): Dry skin Headache Increased tears Nausea Pain, redness, or swelling with sores inside the mouth or throat Sensitivity to light Vomiting This list may not describe all possible side effects. Call your doctor for medical advice about side effects. You may report side effects to FDA at 1-800-FDA-1088. Where should I keep my medication? This medication is given in a hospital or clinic. It will not be stored at home. NOTE: This sheet is a summary. It may not cover all possible information. If you have questions about this medicine, talk to your doctor, pharmacist, or health care provider.  2024 Elsevier/Gold Standard (2021-05-31 00:00:00)        To help prevent nausea and vomiting after your treatment, we encourage you to take your nausea medication as directed.  BELOW ARE SYMPTOMS THAT SHOULD BE REPORTED  IMMEDIATELY: *FEVER GREATER THAN 100.4 F (38 C) OR HIGHER *CHILLS OR SWEATING *NAUSEA AND VOMITING THAT IS NOT CONTROLLED WITH YOUR NAUSEA MEDICATION *UNUSUAL SHORTNESS OF BREATH *UNUSUAL BRUISING OR BLEEDING *URINARY PROBLEMS (pain or burning when urinating, or frequent urination) *BOWEL PROBLEMS (unusual diarrhea, constipation, pain near the anus) TENDERNESS IN MOUTH AND THROAT WITH OR WITHOUT PRESENCE OF ULCERS (sore throat, sores in mouth, or a toothache) UNUSUAL RASH, SWELLING OR PAIN  UNUSUAL VAGINAL DISCHARGE OR ITCHING   Items with * indicate a potential emergency and should be followed up as soon as possible or go to the Emergency Department if any problems should occur.  Please show the CHEMOTHERAPY ALERT CARD or IMMUNOTHERAPY ALERT CARD at check-in to the Emergency Department and triage nurse.  Should you have questions after your visit or need to cancel or reschedule your appointment, please contact [facility]  [place] [phone]  and follow the prompts.  Office hours are 8:00 a.m. to 4:30 p.m. Monday - Friday. Please note that voicemails left after 4:00 p.m. may not be returned until the following business day.  We are closed weekends and major holidays. You have access to a nurse at all times for urgent questions. Please call the main number to the clinic [phone] and follow the prompts.  For any non-urgent questions, you may also contact your provider using MyChart. We now offer e-Visits for anyone 41 and older to request care online for non-urgent symptoms. For details visit [email or web address].   Also download the MyChart app! Go to the app store, search "MyChart", open the app, select [facility], and log in with your MyChart username and password.

## 2022-08-07 NOTE — Patient Instructions (Signed)
[facility] at [facility] Discharge Instructions   You were seen and examined today by Dr. Katragadda.  He reviewed the results of your lab work which are normal/stable.   We will proceed with your treatment today.  Return as scheduled.    Thank you for choosing [facility] at [facility] to provide your oncology and hematology care.  To afford each patient quality time with our provider, please arrive at least 15 minutes before your scheduled appointment time.   If you have a lab appointment with the Cancer Center please come in thru the Main Entrance and check in at the main information desk.  You need to re-schedule your appointment should you arrive 10 or more minutes late.  We strive to give you quality time with our providers, and arriving late affects you and other patients whose appointments are after yours.  Also, if you no show three or more times for appointments you may be dismissed from the clinic at the providers discretion.     Again, thank you for choosing [facility].  Our hope is that these requests will decrease the amount of time that you wait before being seen by our physicians.       _____________________________________________________________  Should you have questions after your visit to [facility], please contact our office at [phone] and follow the prompts.  Our office hours are 8:00 a.m. and 4:30 p.m. Monday - Friday.  Please note that voicemails left after 4:00 p.m. may not be returned until the following business day.  We are closed weekends and major holidays.  You do have access to a nurse 24-7, just call the main number to the clinic [phone] and do not press any options, hold on the line and a nurse will answer the phone.    For prescription refill requests, have your pharmacy contact our office and allow 72 hours.    Due to Covid, you will need to wear a mask upon entering  the hospital. If you do not have a mask, a mask will be given to you at the Main Entrance upon arrival. For doctor visits, patients may have 1 support person age 18 or older with them. For treatment visits, patients can not have anyone with them due to social distancing guidelines and our immunocompromised population.      

## 2022-08-07 NOTE — Progress Notes (Signed)
Patient presents today for chemotherapy infusion. Patient is in satisfactory condition with no new complaints voiced.  Vital signs are stable.  Patient placed on 4 L of oxygen as this is what he is on at home.  Labs reviewed by Dr. Ellin Saba during the office visit and all labs are within treatment parameters.  We will proceed with treatment per MD orders.   Patient tolerated treatment well with no complaints voiced.  Home infusion 5FU pump  connected with no issues.  Patient left via wheelchair with son in stable condition.  Vital signs stable at discharge.  Follow up as scheduled.

## 2022-08-09 ENCOUNTER — Ambulatory Visit: Payer: PPO | Attending: Internal Medicine | Admitting: Internal Medicine

## 2022-08-09 ENCOUNTER — Ambulatory Visit (HOSPITAL_COMMUNITY): Payer: PPO | Admitting: Internal Medicine

## 2022-08-09 ENCOUNTER — Encounter: Payer: Self-pay | Admitting: *Deleted

## 2022-08-09 ENCOUNTER — Inpatient Hospital Stay: Payer: PPO

## 2022-08-09 ENCOUNTER — Encounter: Payer: Self-pay | Admitting: Internal Medicine

## 2022-08-09 VITALS — BP 110/73 | HR 120 | Temp 97.0°F | Resp 19

## 2022-08-09 VITALS — BP 100/72 | HR 132 | Ht 71.0 in | Wt 178.2 lb

## 2022-08-09 DIAGNOSIS — Z5111 Encounter for antineoplastic chemotherapy: Secondary | ICD-10-CM | POA: Diagnosis not present

## 2022-08-09 DIAGNOSIS — I4892 Unspecified atrial flutter: Secondary | ICD-10-CM

## 2022-08-09 DIAGNOSIS — Z01812 Encounter for preprocedural laboratory examination: Secondary | ICD-10-CM | POA: Diagnosis not present

## 2022-08-09 DIAGNOSIS — C20 Malignant neoplasm of rectum: Secondary | ICD-10-CM

## 2022-08-09 MED ORDER — SODIUM CHLORIDE 0.9% FLUSH
10.0000 mL | INTRAVENOUS | Status: DC | PRN
  Administered 2022-08-09: 10 mL

## 2022-08-09 MED ORDER — HEPARIN SOD (PORK) LOCK FLUSH 100 UNIT/ML IV SOLN
500.0000 [IU] | Freq: Once | INTRAVENOUS | Status: AC | PRN
  Administered 2022-08-09: 500 [IU]

## 2022-08-09 NOTE — Progress Notes (Signed)
Patients port flushed without difficulty.  Good blood return noted with no bruising or swelling noted at site.  Band aid applied.  VSS with discharge and left in satisfactory condition with no s/s of distress noted.   

## 2022-08-09 NOTE — Patient Instructions (Signed)
[facility]  Discharge Instructions: Thank you for choosing [facility] to provide your oncology and hematology care.  If you have a lab appointment with the Cancer Center - please note that after April 8th, 2024, all labs will be drawn in the cancer center.  You do not have to check in or register with the main entrance as you have in the past but will complete your check-in in the cancer center.  Wear comfortable clothing and clothing appropriate for easy access to any Portacath or PICC line.   We strive to give you quality time with your provider. You may need to reschedule your appointment if you arrive late (15 or more minutes).  Arriving late affects you and other patients whose appointments are after yours.  Also, if you miss three or more appointments without notifying the office, you may be dismissed from the clinic at the provider's discretion.      For prescription refill requests, have your pharmacy contact our office and allow 72 hours for refills to be completed.  To help prevent nausea and vomiting after your treatment, we encourage you to take your nausea medication as directed.  BELOW ARE SYMPTOMS THAT SHOULD BE REPORTED IMMEDIATELY: *FEVER GREATER THAN 100.4 F (38 C) OR HIGHER *CHILLS OR SWEATING *NAUSEA AND VOMITING THAT IS NOT CONTROLLED WITH YOUR NAUSEA MEDICATION *UNUSUAL SHORTNESS OF BREATH *UNUSUAL BRUISING OR BLEEDING *URINARY PROBLEMS (pain or burning when urinating, or frequent urination) *BOWEL PROBLEMS (unusual diarrhea, constipation, pain near the anus) TENDERNESS IN MOUTH AND THROAT WITH OR WITHOUT PRESENCE OF ULCERS (sore throat, sores in mouth, or a toothache) UNUSUAL RASH, SWELLING OR PAIN  UNUSUAL VAGINAL DISCHARGE OR ITCHING   Items with * indicate a potential emergency and should be followed up as soon as possible or go to the Emergency Department if any problems should occur.  Please show the CHEMOTHERAPY ALERT CARD or  IMMUNOTHERAPY ALERT CARD at check-in to the Emergency Department and triage nurse.  Should you have questions after your visit or need to cancel or reschedule your appointment, please contact [facility] [phone]  and follow the prompts.  Office hours are 8:00 a.m. to 4:30 p.m. Monday - Friday. Please note that voicemails left after 4:00 p.m. may not be returned until the following business day.  We are closed weekends and major holidays. You have access to a nurse at all times for urgent questions. Please call the main number to the clinic [phone] and follow the prompts.  For any non-urgent questions, you may also contact your provider using MyChart. We now offer e-Visits for anyone 18 and older to request care online for non-urgent symptoms. For details visit [email or web address].   Also download the MyChart app! Go to the app store, search "MyChart", open the app, select [facility], and log in with your MyChart username and password.   

## 2022-08-09 NOTE — Patient Instructions (Addendum)
Medication Instructions:   Continue all current medications.   Labwork:  BMET, CBC - do today   Testing/Procedures:  Your physician has recommended that you have an ablation. Catheter ablation is a medical procedure used to treat some cardiac arrhythmias (irregular heartbeats). During catheter ablation, a long, thin, flexible tube is put into a blood vessel in your groin (upper thigh), or neck. This tube is called an ablation catheter. It is then guided to your heart through the blood vessel. Radio frequency waves destroy small areas of heart tissue where abnormal heartbeats may cause an arrhythmia to start. Please see the instruction sheet given to you today.   Follow-Up:  Will be given at hospital discharge   Any Other Special Instructions Will Be Listed Below (If Applicable).   If you need a refill on your cardiac medications before your next appointment, please call your pharmacy.

## 2022-08-09 NOTE — H&P (View-Only) (Signed)
HPI James Miles is referred today by Lake Bells PA for evaluation of atrial flutter. He is a pleasant 77 yo man with rectal CA, COPD, and dyslipidemia. He has been found to have atrial flutter with a RVR and has undergone DCCV. He was placed on systemic anti-coag. His CHADSVASC score is 3. He was placed on amiodarone. He has reverted back to atrial flutter with a RVR. He has class 3 symptoms.  No Known Allergies   Current Outpatient Medications  Medication Sig Dispense Refill   acidophilus (RISAQUAD) CAPS capsule Take 1 capsule by mouth daily.     albuterol (VENTOLIN HFA) 108 (90 Base) MCG/ACT inhaler Inhale 2 puffs into the lungs every 6 (six) hours as needed for wheezing or shortness of breath.     allopurinol (ZYLOPRIM) 300 MG tablet Take 300 mg by mouth daily.     amiodarone (PACERONE) 200 MG tablet Take 1 tablet (200 mg total) by mouth 2 (two) times daily. 60 tablet 1   atorvastatin (LIPITOR) 20 MG tablet Take 20 mg by mouth daily.     dextrose 5 % SOLN 1,000 mL with fluorouracil 5 GM/100ML SOLN Inject into the vein.     fenofibrate 54 MG tablet Take 54 mg by mouth daily.     JARDIANCE 10 MG TABS tablet Take 25 mg by mouth daily.     LEUCOVORIN CALCIUM IV Inject into the vein.     lidocaine-prilocaine (EMLA) cream Apply 1 Application topically as needed. (Patient taking differently: Apply 1 Application topically as needed (PORT).) 30 g 0   loperamide (IMODIUM) 2 MG capsule Take 2 mg by mouth 3 (three) times daily as needed for diarrhea or loose stools.     MELATONIN PO Take 1 tablet by mouth at bedtime as needed (sleep).     Misc. Devices MISC Please provide with shower chair 1 Units 0   OXALIPLATIN IV Inject into the vein.     prochlorperazine (COMPAZINE) 10 MG tablet Take 1 tablet (10 mg total) by mouth every 6 (six) hours as needed for nausea or vomiting. 30 tablet 2   rivaroxaban (XARELTO) 20 MG TABS tablet Take 1 tablet (20 mg total) by mouth daily with supper. 30 tablet 1    No current facility-administered medications for this visit.     Past Medical History:  Diagnosis Date   Back pain    COPD (chronic obstructive pulmonary disease) (HCC)    Diabetes mellitus without complication (HCC)    Hyperlipidemia     ROS:   All systems reviewed and negative except as noted in the HPI.   Past Surgical History:  Procedure Laterality Date   BIOPSY  04/26/2022   Procedure: BIOPSY;  Surgeon: Corbin Ade, MD;  [facility];  Service: Endoscopy;;   COLONOSCOPY WITH PROPOFOL N/A 04/26/2022   Procedure: COLONOSCOPY WITH PROPOFOL;  Surgeon: Corbin Ade, MD;  [facility];  Service: Endoscopy;  Laterality: N/A;  10:00am; ASA 3   hemorhoidectomy     IR IMAGING GUIDED PORT INSERTION  05/12/2022   POLYPECTOMY  04/26/2022   Procedure: POLYPECTOMY;  Surgeon: Corbin Ade, MD;  [facility];  Service: Endoscopy;;     No family history on file.   Social History   Socioeconomic History   Marital status: Widowed    Spouse name: Not on file   Number of children: 2   Years of education: Not on file   Highest education level:  Not on file  Occupational History   Not on file  Tobacco Use   Smoking status: Former    Years: 50    Types: Cigarettes    Quit date: 08/2016    Years since quitting: 6.0   Smokeless tobacco: Never  Vaping Use   Vaping Use: Never used  Substance and Sexual Activity   Alcohol use: Never   Drug use: Never   Sexual activity: Not Currently  Other Topics Concern   Not on file  Social History Narrative   Not on file   Social Determinants of Health   Financial Resource Strain: Not on file  Food Insecurity: No Food Insecurity (07/07/2022)   Hunger Vital Sign    Worried About Running Out of Food in the Last Year: Never true    Ran Out of Food in the Last Year: Never true  Transportation Needs: No Transportation Needs (07/07/2022)   PRAPARE - Administrator, Civil Service (Medical): No     Lack of Transportation (Non-Medical): No  Physical Activity: Not on file  Stress: Not on file  Social Connections: Not on file  Intimate Partner Violence: Not At Risk (07/07/2022)   Humiliation, Afraid, Rape, and Kick questionnaire    Fear of Current or Ex-Partner: No    Emotionally Abused: No    Physically Abused: No    Sexually Abused: No     BP 100/72 (BP Location: Left Arm, Patient Position: Sitting, Cuff Size: Normal)   Pulse (!) 132   Ht 5\' 11"  (1.803 m)   Wt 178 lb 3.2 oz (80.8 kg)   SpO2 90%   BMI 24.85 kg/m   Physical Exam:  Chronically ill appearing NAD HEENT: Unremarkable Neck:  No JVD, no thyromegally Lymphatics:  No adenopathy Back:  No CVA tenderness Lungs:  scattered rales HEART:  Reg tachy rhythm, no murmurs, no rubs, no clicks Abd:  soft, positive bowel sounds, no organomegally, no rebound, no guarding Ext:  2 plus pulses, no edema, no cyanosis, no clubbing Skin:  No rashes no nodules Neuro:  CN II through XII intact, motor grossly intact  EKG - reviewed. Typical atrial flutter  Assess/Plan: Atrial flutter - he has had ERAF and is in flutter today on exam. I have discussed the indications/risks/benefits/goals/expectations of EP study and catheter ablation and he wishes to proceed. COPD - he is not on home oxygen. He stopped smoking about 3 years ago.  Coags - I strongly encouraged him not to miss any of his blood thinners.  Sharlot Gowda Taylor,MD

## 2022-08-09 NOTE — Progress Notes (Signed)
HPI [name] is referred today by James Bells PA for evaluation of atrial flutter. He is a pleasant 77 yo man with rectal CA, COPD, and dyslipidemia. He has been found to have atrial flutter with a RVR and has undergone DCCV. He was placed on systemic anti-coag. His CHADSVASC score is 3. He was placed on amiodarone. He has reverted back to atrial flutter with a RVR. He has class 3 symptoms.  No Known Allergies   Current Outpatient Medications  Medication Sig Dispense Refill   acidophilus (RISAQUAD) CAPS capsule Take 1 capsule by mouth daily.     albuterol (VENTOLIN HFA) 108 (90 Base) MCG/ACT inhaler Inhale 2 puffs into the lungs every 6 (six) hours as needed for wheezing or shortness of breath.     allopurinol (ZYLOPRIM) 300 MG tablet Take 300 mg by mouth daily.     amiodarone (PACERONE) 200 MG tablet Take 1 tablet (200 mg total) by mouth 2 (two) times daily. 60 tablet 1   atorvastatin (LIPITOR) 20 MG tablet Take 20 mg by mouth daily.     dextrose 5 % SOLN 1,000 mL with fluorouracil 5 GM/100ML SOLN Inject into the vein.     fenofibrate 54 MG tablet Take 54 mg by mouth daily.     JARDIANCE 10 MG TABS tablet Take 25 mg by mouth daily.     LEUCOVORIN CALCIUM IV Inject into the vein.     lidocaine-prilocaine (EMLA) cream Apply 1 Application topically as needed. (Patient taking differently: Apply 1 Application topically as needed (PORT).) 30 g 0   loperamide (IMODIUM) 2 MG capsule Take 2 mg by mouth 3 (three) times daily as needed for diarrhea or loose stools.     MELATONIN PO Take 1 tablet by mouth at bedtime as needed (sleep).     Misc. Devices MISC Please provide with shower chair 1 Units 0   OXALIPLATIN IV Inject into the vein.     prochlorperazine (COMPAZINE) 10 MG tablet Take 1 tablet (10 mg total) by mouth every 6 (six) hours as needed for nausea or vomiting. 30 tablet 2   rivaroxaban (XARELTO) 20 MG TABS tablet Take 1 tablet (20 mg total) by mouth daily with supper. 30 tablet 1    No current facility-administered medications for this visit.     Past Medical History:  Diagnosis Date   Back pain    COPD (chronic obstructive pulmonary disease) (HCC)    Diabetes mellitus without complication (HCC)    Hyperlipidemia     ROS:   All systems reviewed and negative except as noted in the HPI.   Past Surgical History:  Procedure Laterality Date   BIOPSY  04/26/2022   Procedure: BIOPSY;  Surgeon: Corbin Ade, MD;  [facility];  Service: Endoscopy;;   COLONOSCOPY WITH PROPOFOL N/A 04/26/2022   Procedure: COLONOSCOPY WITH PROPOFOL;  Surgeon: Corbin Ade, MD;  [facility];  Service: Endoscopy;  Laterality: N/A;  10:00am; ASA 3   hemorhoidectomy     IR IMAGING GUIDED PORT INSERTION  05/12/2022   POLYPECTOMY  04/26/2022   Procedure: POLYPECTOMY;  Surgeon: Corbin Ade, MD;  [facility];  Service: Endoscopy;;     No family history on file.   Social History   Socioeconomic History   Marital status: Widowed    Spouse name: Not on file   Number of children: 2   Years of education: Not on file   Highest education level:  Not on file  Occupational History   Not on file  Tobacco Use   Smoking status: Former    Years: 50    Types: Cigarettes    Quit date: 08/2016    Years since quitting: 6.0   Smokeless tobacco: Never  Vaping Use   Vaping Use: Never used  Substance and Sexual Activity   Alcohol use: Never   Drug use: Never   Sexual activity: Not Currently  Other Topics Concern   Not on file  Social History Narrative   Not on file   Social Determinants of Health   Financial Resource Strain: Not on file  Food Insecurity: No Food Insecurity (07/07/2022)   Hunger Vital Sign    Worried About Running Out of Food in the Last Year: Never true    Ran Out of Food in the Last Year: Never true  Transportation Needs: No Transportation Needs (07/07/2022)   PRAPARE - Administrator, Civil Service (Medical): No     Lack of Transportation (Non-Medical): No  Physical Activity: Not on file  Stress: Not on file  Social Connections: Not on file  Intimate Partner Violence: Not At Risk (07/07/2022)   Humiliation, Afraid, Rape, and Kick questionnaire    Fear of Current or Ex-Partner: No    Emotionally Abused: No    Physically Abused: No    Sexually Abused: No     BP 100/72 (BP Location: Left Arm, Patient Position: Sitting, Cuff Size: Normal)   Pulse (!) 132   Ht 5\' 11"  (1.803 m)   Wt 178 lb 3.2 oz (80.8 kg)   SpO2 90%   BMI 24.85 kg/m   Physical Exam:  Chronically ill appearing NAD HEENT: Unremarkable Neck:  No JVD, no thyromegally Lymphatics:  No adenopathy Back:  No CVA tenderness Lungs:  scattered rales HEART:  Reg tachy rhythm, no murmurs, no rubs, no clicks Abd:  soft, positive bowel sounds, no organomegally, no rebound, no guarding Ext:  2 plus pulses, no edema, no cyanosis, no clubbing Skin:  No rashes no nodules Neuro:  CN II through XII intact, motor grossly intact  EKG - reviewed. Typical atrial flutter  Assess/Plan: Atrial flutter - he has had ERAF and is in flutter today on exam. I have discussed the indications/risks/benefits/goals/expectations of EP study and catheter ablation and he wishes to proceed. COPD - he is not on home oxygen. He stopped smoking about 3 years ago.  Coags - I strongly encouraged him not to miss any of his blood thinners.  James Gowda Lestine Rahe,MD

## 2022-08-11 ENCOUNTER — Other Ambulatory Visit (HOSPITAL_COMMUNITY)
Admission: RE | Admit: 2022-08-11 | Discharge: 2022-08-11 | Disposition: A | Discharge status: Home / Self Care | Payer: PPO | Source: Ambulatory Visit | Attending: Internal Medicine | Admitting: Internal Medicine

## 2022-08-11 ENCOUNTER — Telehealth: Payer: Self-pay | Admitting: Internal Medicine

## 2022-08-11 DIAGNOSIS — Z01812 Encounter for preprocedural laboratory examination: Secondary | ICD-10-CM | POA: Insufficient documentation

## 2022-08-11 DIAGNOSIS — I4892 Unspecified atrial flutter: Secondary | ICD-10-CM | POA: Diagnosis not present

## 2022-08-11 LAB — CBC
HCT: 44.7 % (ref 39.0–52.0)
Hemoglobin: 14.7 g/dL (ref 13.0–17.0)
MCH: 32 pg (ref 26.0–34.0)
MCHC: 32.9 g/dL (ref 30.0–36.0)
MCV: 97.4 fL (ref 80.0–100.0)
Platelets: 196 10*3/uL (ref 150–400)
RBC: 4.59 MIL/uL (ref 4.22–5.81)
RDW: 15.6 % — ABNORMAL HIGH (ref 11.5–15.5)
WBC: 7 10*3/uL (ref 4.0–10.5)
nRBC: 0 % (ref 0.0–0.2)

## 2022-08-11 LAB — BASIC METABOLIC PANEL
Anion gap: 10 (ref 5–15)
BUN: 17 mg/dL (ref 8–23)
CO2: 26 mmol/L (ref 22–32)
Calcium: 8.6 mg/dL — ABNORMAL LOW (ref 8.9–10.3)
Chloride: 101 mmol/L (ref 98–111)
Creatinine, Ser: 1.1 mg/dL (ref 0.61–1.24)
GFR, Estimated: >60 mL/min (ref >60)
Glucose, Bld: 134 mg/dL — ABNORMAL HIGH (ref 70–99)
Potassium: 4.1 mmol/L (ref 3.5–5.1)
Sodium: 137 mmol/L (ref 135–145)

## 2022-08-11 NOTE — Telephone Encounter (Signed)
Checking precert for::  Flutter Ablation 08-23-2022 [facility]

## 2022-08-14 ENCOUNTER — Other Ambulatory Visit: Payer: Self-pay | Admitting: *Deleted

## 2022-08-16 ENCOUNTER — Inpatient Hospital Stay: Payer: PPO | Admitting: Hematology

## 2022-08-16 ENCOUNTER — Inpatient Hospital Stay: Payer: PPO

## 2022-08-18 ENCOUNTER — Inpatient Hospital Stay: Payer: PPO

## 2022-08-18 ENCOUNTER — Encounter: Payer: Self-pay | Admitting: Hematology

## 2022-08-21 ENCOUNTER — Inpatient Hospital Stay: Payer: PPO

## 2022-08-21 ENCOUNTER — Inpatient Hospital Stay: Payer: PPO | Admitting: Hematology

## 2022-08-23 ENCOUNTER — Inpatient Hospital Stay: Payer: PPO

## 2022-08-23 ENCOUNTER — Encounter (HOSPITAL_COMMUNITY)
Admission: RE | Disposition: A | Discharge status: Home / Self Care | Payer: Self-pay | Source: Home / Self Care | Attending: Internal Medicine

## 2022-08-23 ENCOUNTER — Ambulatory Visit (HOSPITAL_COMMUNITY)
Admission: RE | Admit: 2022-08-23 | Discharge: 2022-08-24 | Disposition: A | Discharge status: Home / Self Care | Payer: PPO | Attending: Internal Medicine | Admitting: Internal Medicine

## 2022-08-23 ENCOUNTER — Other Ambulatory Visit: Payer: Self-pay

## 2022-08-23 DIAGNOSIS — I483 Typical atrial flutter: Secondary | ICD-10-CM

## 2022-08-23 DIAGNOSIS — Z7901 Long term (current) use of anticoagulants: Secondary | ICD-10-CM | POA: Diagnosis not present

## 2022-08-23 DIAGNOSIS — I4892 Unspecified atrial flutter: Secondary | ICD-10-CM | POA: Diagnosis present

## 2022-08-23 DIAGNOSIS — I428 Other cardiomyopathies: Secondary | ICD-10-CM | POA: Insufficient documentation

## 2022-08-23 DIAGNOSIS — Z87891 Personal history of nicotine dependence: Secondary | ICD-10-CM | POA: Insufficient documentation

## 2022-08-23 DIAGNOSIS — J449 Chronic obstructive pulmonary disease, unspecified: Secondary | ICD-10-CM | POA: Diagnosis not present

## 2022-08-23 HISTORY — PX: A-FLUTTER ABLATION: EP1230

## 2022-08-23 LAB — GLUCOSE, CAPILLARY: Glucose-Capillary: 133 mg/dL — ABNORMAL HIGH (ref 70–99)

## 2022-08-23 SURGERY — A-FLUTTER ABLATION

## 2022-08-23 MED ORDER — BUPIVACAINE HCL (PF) 0.25 % IJ SOLN
INTRAMUSCULAR | Status: AC
  Filled 2022-08-23: qty 30

## 2022-08-23 MED ORDER — ONDANSETRON HCL 4 MG/2ML IJ SOLN: 4.0000 mg | Freq: Four times a day (QID) | INTRAMUSCULAR | Status: DC | PRN

## 2022-08-23 MED ORDER — FENTANYL CITRATE (PF) 100 MCG/2ML IJ SOLN
INTRAMUSCULAR | Status: AC
  Filled 2022-08-23: qty 2

## 2022-08-23 MED ORDER — MIDAZOLAM HCL 5 MG/5ML IJ SOLN
INTRAMUSCULAR | Status: DC | PRN
  Administered 2022-08-23: 2 mg via INTRAVENOUS

## 2022-08-23 MED ORDER — ACETAMINOPHEN 325 MG PO TABS: 650.0000 mg | ORAL_TABLET | ORAL | Status: DC | PRN

## 2022-08-23 MED ORDER — RIVAROXABAN 20 MG PO TABS
20.0000 mg | ORAL_TABLET | Freq: Every day | ORAL | Status: DC
  Administered 2022-08-23: 20 mg via ORAL
  Filled 2022-08-23: qty 1

## 2022-08-23 MED ORDER — SODIUM CHLORIDE 0.9 % IV SOLN: 250.0000 mL | INTRAVENOUS | Status: DC | PRN

## 2022-08-23 MED ORDER — BUPIVACAINE HCL (PF) 0.25 % IJ SOLN
INTRAMUSCULAR | Status: DC | PRN
  Administered 2022-08-23: 50 mL

## 2022-08-23 MED ORDER — SODIUM CHLORIDE 0.9% FLUSH
3.0000 mL | INTRAVENOUS | Status: DC | PRN
  Administered 2022-08-23: 3 mL via INTRAVENOUS

## 2022-08-23 MED ORDER — FENTANYL CITRATE (PF) 100 MCG/2ML IJ SOLN
INTRAMUSCULAR | Status: DC | PRN
  Administered 2022-08-23: 25 ug via INTRAVENOUS
  Administered 2022-08-23: 25 ug

## 2022-08-23 MED ORDER — SODIUM CHLORIDE 0.9% FLUSH: 3.0000 mL | Freq: Two times a day (BID) | INTRAVENOUS | Status: DC

## 2022-08-23 MED ORDER — HEPARIN (PORCINE) IN NACL 1000-0.9 UT/500ML-% IV SOLN
INTRAVENOUS | Status: DC | PRN
  Administered 2022-08-23: 500 mL

## 2022-08-23 MED ORDER — MIDAZOLAM HCL 5 MG/5ML IJ SOLN
INTRAMUSCULAR | Status: AC
  Filled 2022-08-23: qty 5

## 2022-08-23 SURGICAL SUPPLY — 12 items
BAG SNAP BAND KOVER 36X36 (MISCELLANEOUS) IMPLANT
CATH JOSEPH QUAD ALLRED 6F REP (CATHETERS) IMPLANT
CATH SMTCH THERMOCOOL SF FJ (CATHETERS) IMPLANT
CATH WEBSTER BI DIR CS D-F CRV (CATHETERS) IMPLANT
PACK EP LATEX FREE (CUSTOM PROCEDURE TRAY) ×1
PACK EP LF (CUSTOM PROCEDURE TRAY) ×1 IMPLANT
PAD DEFIB RADIO PHYSIO CONN (PAD) ×1 IMPLANT
PATCH CARTO3 (PAD) IMPLANT
SHEATH PINNACLE 6F 10CM (SHEATH) IMPLANT
SHEATH PINNACLE 7F 10CM (SHEATH) IMPLANT
SHEATH PINNACLE 8F 10CM (SHEATH) IMPLANT
TUBING SMART ABLATE COOLFLOW (TUBING) IMPLANT

## 2022-08-23 NOTE — Progress Notes (Signed)
Site area: Right groin a 6,7,8 french venous sheath was removed  Site Prior to Removal:  Level 0  Pressure Applied For 20 MINUTES    Bedrest Beginning at 1710p X 6 hours  Manual:   Yes.    Patient Status During Pull:  stable  Post Pull Groin Site:  Level 0  Post Pull Instructions Given:  Yes.    Post Pull Pulses Present:  Yes.    Dressing Applied:  Yes.    Comments:

## 2022-08-23 NOTE — Interval H&P Note (Signed)
History and Physical Interval Note:  08/23/2022 2:26 PM  James Miles  has presented today for surgery, with the diagnosis of affluter.  The various methods of treatment have been discussed with the patient and family. After consideration of risks, benefits and other options for treatment, the patient has consented to  Procedure(s): A-FLUTTER ABLATION (N/A) as a surgical intervention.  The patient's history has been reviewed, patient examined, no change in status, stable for surgery.  I have reviewed the patient's chart and labs.  Questions were answered to the patient's satisfaction.     Lewayne Bunting

## 2022-08-23 NOTE — Discharge Instructions (Signed)

## 2022-08-24 ENCOUNTER — Other Ambulatory Visit: Payer: Self-pay | Admitting: Physician Assistant

## 2022-08-24 ENCOUNTER — Encounter (HOSPITAL_COMMUNITY): Payer: Self-pay | Admitting: Internal Medicine

## 2022-08-24 DIAGNOSIS — I4892 Unspecified atrial flutter: Secondary | ICD-10-CM

## 2022-08-24 DIAGNOSIS — I483 Typical atrial flutter: Secondary | ICD-10-CM | POA: Diagnosis not present

## 2022-08-24 DIAGNOSIS — J449 Chronic obstructive pulmonary disease, unspecified: Secondary | ICD-10-CM | POA: Diagnosis not present

## 2022-08-24 DIAGNOSIS — I428 Other cardiomyopathies: Secondary | ICD-10-CM

## 2022-08-24 DIAGNOSIS — Z87891 Personal history of nicotine dependence: Secondary | ICD-10-CM | POA: Diagnosis not present

## 2022-08-24 MED ORDER — AMIODARONE HCL 200 MG PO TABS: 200.0000 mg | ORAL_TABLET | Freq: Every day | ORAL | 3 refills | Status: DC

## 2022-08-24 NOTE — Discharge Summary (Addendum)
ELECTROPHYSIOLOGY PROCEDURE DISCHARGE SUMMARY    Patient ID: James Miles,  MRN: [id number], DOB/AGE: March 17, 1945 77 y.o.  Admit date: 08/23/2022 Discharge date: 08/24/2022  Primary Care Physician: Ponciano Ort [facility]  Primary Cardiologist: Dr. Eden Emms Electrophysiologist: Dr. Ladona Ridgel  Primary Discharge Diagnosis:  Typical AFlutter      CHA2DS2Vasc is 3, on Xarelto  Secondary Discharge Diagnosis:  DM COPD Rectal cancer Stage II, on chemo NICM Suspect tachy mediated ?PVCs 4.   PVCs  Procedures This Admission:  1.  Electrophysiology study and radiofrequency catheter ablation on by Dr Ladona Ridgel   This study demonstrated  CONCLUSIONS:  1. Isthmus-dependent right atrial flutter upon presentation.  2. Successful radiofrequency ablation of atrial flutter along the cavotricuspid isthmus with complete bidirectional isthmus block achieved using irrigated 3D electroanatomical mapping and ablation catheter.  3. No inducible arrhythmias following ablation.  4. No early apparent complications.    Brief HPI: James Miles is a 77 y.o. male with a history of new onset AFlutter and cardiomyopathy.  His CM suspect 2/2 AFlutter and referred for consideration of ablation  Risks, benefits, and alternatives to catheter ablation were reviewed with the patient who wished to proceed.    Hospital Course:  The patient was admitted and underwent EPS/RFCA with details as outlined in his procedure report  He was monitored on telemetry overnight which demonstrated SR with intermittently frequent PVCs with periods of trigeminy.  Dr. Ladona Ridgel discussed with the patient we will keep amiodarone for now at 200mg  daily given PVCs, plan to revisi this echo in about 2 mo to re-evaluate his LVEF without rapid Aflutter and hopefully more suppression of his PVCs.  That chemo can sometime contribute to CM as wel,l, and we will monitor this closely.  Groin was without complication on the day of discharge.  The  patient feels well today, denies CP, SOB, site discomfort.  He was examined by Dr. Ladona Ridgel and considered to be stable for discharge.  Wound care and restrictions were reviewed with the patient.  EP follow up is in place   Physical Exam: Vitals:   08/23/22 2032 08/23/22 2323 08/24/22 0322 08/24/22 0755  BP: 94/66 (!) 115/58 105/60 139/73  Pulse: 60 (!) 57 (!) 57 61  Resp: 18 18 20 13   Temp: 98.1 F (36.7 C) 98.2 F (36.8 C) 98.6 F (37 C) 98.3 F (36.8 C)  TempSrc: Oral Oral Oral Oral  SpO2: 98% 98% 98%   Weight:   80.2 kg   Height:        GEN- The patient is well appearing, alert and oriented x 3 today.   HEENT: normocephalic, atraumatic; sclera clear, conjunctiva pink; hearing intact; oropharynx clear; neck supple  Lungs-  CTA b/l, normal work of breathing.  No wheezes, rales, rhonchi Heart- RRR, no murmurs, rubs or gallops  GI- soft, non-tender, non-distended, bowel sounds present  Extremities- no clubbing, cyanosis, or edema; R groin is without hematoma/bruit MS- no significant deformity or atrophy Skin- warm and dry, no rash or lesion Psych- euthymic mood, full affect Neuro- strength and sensation are intact   Labs:   Lab Results  Component Value Date   WBC 7.0 08/11/2022   HGB 14.7 08/11/2022   HCT 44.7 08/11/2022   MCV 97.4 08/11/2022   PLT 196 08/11/2022   No results for input(s): "NA", "K", "CL", "CO2", "BUN", "CREATININE", "CALCIUM", "PROT", "BILITOT", "ALKPHOS", "ALT", "AST", "GLUCOSE" in the last 168 hours.  Invalid input(s): "LABALBU"   Discharge Medications:  Allergies  as of 08/24/2022   No Known Allergies      Medication List     TAKE these medications    acidophilus Caps capsule Take 1 capsule by mouth daily.   albuterol 108 (90 Base) MCG/ACT inhaler Commonly known as: VENTOLIN HFA Inhale 2 puffs into the lungs every 6 (six) hours as needed for wheezing or shortness of breath.   allopurinol 300 MG tablet Commonly known as: ZYLOPRIM Take  300 mg by mouth daily.   amiodarone 200 MG tablet Commonly known as: Pacerone Take 1 tablet (200 mg total) by mouth daily. What changed: when to take this   atorvastatin 20 MG tablet Commonly known as: LIPITOR Take 20 mg by mouth daily.   dextrose 5 % SOLN 1,000 mL with fluorouracil 5 GM/100ML SOLN Inject into the vein.   fenofibrate 54 MG tablet Take 54 mg by mouth daily.   Jardiance 10 MG Tabs tablet Generic drug: empagliflozin Take 25 mg by mouth daily.   LEUCOVORIN CALCIUM IV Inject into the vein.   lidocaine-prilocaine cream Commonly known as: EMLA Apply 1 Application topically as needed. What changed: reasons to take this   loperamide 2 MG capsule Commonly known as: IMODIUM Take 2 mg by mouth 3 (three) times daily as needed for diarrhea or loose stools.   MELATONIN PO Take 1 tablet by mouth at bedtime as needed (sleep).   Misc. Devices Misc Please provide with shower chair   OXALIPLATIN IV Inject into the vein.   prochlorperazine 10 MG tablet Commonly known as: COMPAZINE Take 1 tablet (10 mg total) by mouth every 6 (six) hours as needed for nausea or vomiting.   rivaroxaban 20 MG Tabs tablet Commonly known as: XARELTO Take 1 tablet (20 mg total) by mouth daily with supper.        Disposition: Home Discharge Instructions     Diet - low sodium heart healthy   Complete by: As directed    Increase activity slowly   Complete by: As directed         Duration of Discharge Encounter: Greater than 30 minutes including physician time.  Norma Fredrickson, PA-C 08/24/2022 9:11 AM  EP Attending  Patient seen and examined.He is stable for DC. He will continue his current meds. He is maintaining NSR.  Sharlot Gowda Olivianna Higley,MD

## 2022-08-27 NOTE — Progress Notes (Signed)
[facility] [address], [place] [address]    Clinic Day:  08/27/2022  Referring physician: Ponciano Ort [name]  Patient Care Team: [place], [facility] as PCP - General Wendall Stade, MD as PCP - Cardiology (Cardiology) Marinus Maw, MD as PCP - Electrophysiology (Cardiology) Therese Sarah, RN as Oncology Nurse Navigator (Medical Oncology) Doreatha Massed, MD as Medical Oncologist (Medical Oncology)   ASSESSMENT & PLAN:   Assessment: 1.  Stage II (T3 N0 M0) low rectal adenocarcinoma, MMR preserved: - 24-month history of intermittent rectal bleeding.  No weight loss. - Colonoscopy (04/26/2022): Large cauliflower mass palpated in the distal rectum on DRE.  Scattered diverticula in the descending colon.  5 mm polyp in the ascending colon.  In the rectum, exophytic semilunar neoplastic appearing process beginning at the anal verge and extending proximally about 6 cm. - Pathology (04/26/2022): Invasive moderately differentiated adenocarcinoma of the rectal mass.  MMR preserved. - CT CAP (05/01/2022): Nodular wall thickening along the rectum.  No intrinsic abnormal lymph nodes.  No liver lesion.  Fatty liver.  Nodule along the left side of the urinary bladder.  Small mass along the course of the distal left ureter.  Worrisome for multifocal TCC.  Small lung nodules measuring up to 4 mm. - MRI pelvis (05/03/2022): 6.4 cm left lateral low rectal tumor abutting the internal anal sphincter, T3c N0.  Distance from tumor to the internal anal sphincter is 0 cm.  Tumor measures 6.4 cm in length and up to 2.2 cm in thickness. - PET scan (05/11/2022): No evidence of metastatic disease. - Met with Dr. Maisie Fus on 05/16/2022: She is in agreement with TNT and has discussed APR with colostomy. - TNT: FOLFOX cycle 1 started on 06/07/2022   2.  Social/family history: -He lives at home and is independent of ADLs and IADLs.  He is accompanied by his son today.  Worked at  [employer] prior to retirement.  Also served in [place] but denied any exposure to agent orange.  Quit smoking 1 year ago.  Smoked 1 to 2 packs/day for the last 61 years.  Started at age 13. - No family history of malignancies.    Plan: 1.  Stage II (T3 cN0 M0) low rectal adenocarcinoma, MMR preserved: - He has completed 4 cycles of chemotherapy on 07/19/2022. - He had a couple of admissions with atrial flutter and RVR. - He does not report any chemotherapy side effects today.  No neuropathy side effects. - Reviewed labs today: Normal LFTs and creatinine.  CBC grossly normal. - Will proceed with cycle 5 today.  Will dose reduce by 20% due to recent hospitalizations. - RTC 2 weeks for follow-up.   2.  Left urinary bladder mass and distal left ureteral mass: - Office cystoscopy by Dr. Ronne Binning showed 3 cm left lateral wall bladder mass. - She will be scheduled for TURBT/biopsy after he completes chemotherapy.  3.  A-flutter with RVR: - Recent hospitalization records reviewed. - Continue Xarelto 20 mg daily and amiodarone 200 mg twice daily. - He has an appointment with EP for possible ablation.    No orders of the defined types were placed in this encounter.     James Miles,acting as a Neurosurgeon for Doreatha Massed, MD.,have documented all relevant documentation on the behalf of Doreatha Massed, MD,as directed by  Doreatha Massed, MD while in the presence of Doreatha Massed, MD.  ***   James Miles  7/21/20241:51 PM  CHIEF COMPLAINT:   Diagnosis: Rectal adenocarcinoma    Cancer Staging  Rectal adenocarcinoma (HCC) Staging form: Colon and Rectum, AJCC 8th Edition - Clinical stage from 05/07/2022: Stage IIA (cT3, cN0, cM0) - Unsigned    Prior Therapy: none  Current Therapy:  Concurrent chemoradiation with FOLFOX, to be followed by surgery    HISTORY OF PRESENT ILLNESS:   Oncology History  Rectal adenocarcinoma (HCC)  05/07/2022  Initial Diagnosis   Rectal adenocarcinoma (HCC)   06/07/2022 -  Chemotherapy   Patient is on Treatment Plan : COLORECTAL FOLFOX q14d x 4 months        INTERVAL HISTORY:   James Miles is a 77 y.o. male presenting to clinic today for follow up of Rectal adenocarcinoma. He was last seen by me on 08/07/22.  Since his last visit he underwent an A-Flutter Ablation on 7/17. EP study found isthmus-dependent right atrial flutter upon presentation, successful radiofrequency ablation of atrial flutter along the cavotricuspid isthmus with complete bidirectional isthmus block achieved using irrigated 3D electroanatomical mapping and ablation catheter, no inducible arrhythmias following ablation, and no early apparent complications.   Today, he states that he is doing well overall. His appetite level is at ***%. His energy level is at ***%.  PAST MEDICAL HISTORY:   Past Medical History: Past Medical History:  Diagnosis Date   Back pain    COPD (chronic obstructive pulmonary disease) (HCC)    Diabetes mellitus without complication (HCC)    Hyperlipidemia     Surgical History: Past Surgical History:  Procedure Laterality Date   A-FLUTTER ABLATION N/A 08/23/2022   Procedure: A-FLUTTER ABLATION;  Surgeon: Marinus Maw, MD;  Location: [facility] INVASIVE [facility] LAB;  Service: Cardiovascular;  Laterality: N/A;   BIOPSY  04/26/2022   Procedure: BIOPSY;  Surgeon: Corbin Ade, MD;  Location: [facility] ENDO SUITE;  Service: Endoscopy;;   COLONOSCOPY WITH PROPOFOL N/A 04/26/2022   Procedure: COLONOSCOPY WITH PROPOFOL;  Surgeon: Corbin Ade, MD;  Location: [facility]O [facility];  Service: Endoscopy;  Laterality: N/A;  10:00am; ASA 3   hemorhoidectomy     IR IMAGING GUIDED PORT INSERTION  05/12/2022   POLYPECTOMY  04/26/2022   Procedure: POLYPECTOMY;  Surgeon: Corbin Ade, MD;  Location: [facility];  Service: Endoscopy;;    Social History: Social History   Socioeconomic History   Marital status: Widowed    Spouse name:  Not on file   Number of children: 2   Years of education: Not on file   Highest education level: Not on file  Occupational History   Not on file  Tobacco Use   Smoking status: Former    Current packs/day: 0.00    Types: Cigarettes    Start date: 08/1966    Quit date: 08/2016    Years since quitting: 6.0   Smokeless tobacco: Never  Vaping Use   Vaping status: Never Used  Substance and Sexual Activity   Alcohol use: Never   Drug use: Never   Sexual activity: Not Currently  Other Topics Concern   Not on file  Social History Narrative   Not on file   Social Determinants of Health   Financial Resource Strain: Not on file  Food Insecurity: No Food Insecurity (07/07/2022)   Hunger Vital Sign    Worried About Running Out of Food in the Last Year: Never true    Ran Out of Food in the Last Year: Never true  Transportation Needs: No Transportation Needs (07/07/2022)  PRAPARE - Administrator, Civil Service (Medical): No    Lack of Transportation (Non-Medical): No  Physical Activity: Not on file  Stress: Not on file  Social Connections: Not on file  Intimate Partner Violence: Not At Risk (07/07/2022)   Humiliation, Afraid, Rape, and Kick questionnaire    Fear of Current or Ex-Partner: No    Emotionally Abused: No    Physically Abused: No    Sexually Abused: No    Family History: No family history on file.  Current Medications:  Current Outpatient Medications:    acidophilus (RISAQUAD) CAPS capsule, Take 1 capsule by mouth daily., Disp: , Rfl:    albuterol (VENTOLIN HFA) 108 (90 Base) MCG/ACT inhaler, Inhale 2 puffs into the lungs every 6 (six) hours as needed for wheezing or shortness of breath., Disp: , Rfl:    allopurinol (ZYLOPRIM) 300 MG tablet, Take 300 mg by mouth daily., Disp: , Rfl:    amiodarone (PACERONE) 200 MG tablet, Take 1 tablet (200 mg total) by mouth daily., Disp: 30 tablet, Rfl: 3   atorvastatin (LIPITOR) 20 MG tablet, Take 20 mg by mouth  daily., Disp: , Rfl:    dextrose 5 % SOLN 1,000 mL with fluorouracil 5 GM/100ML SOLN, Inject into the vein., Disp: , Rfl:    fenofibrate 54 MG tablet, Take 54 mg by mouth daily., Disp: , Rfl:    JARDIANCE 10 MG TABS tablet, Take 25 mg by mouth daily., Disp: , Rfl:    LEUCOVORIN CALCIUM IV, Inject into the vein., Disp: , Rfl:    lidocaine-prilocaine (EMLA) cream, Apply 1 Application topically as needed. (Patient taking differently: Apply 1 Application topically as needed (PORT).), Disp: 30 g, Rfl: 0   loperamide (IMODIUM) 2 MG capsule, Take 2 mg by mouth 3 (three) times daily as needed for diarrhea or loose stools., Disp: , Rfl:    MELATONIN PO, Take 1 tablet by mouth at bedtime as needed (sleep)., Disp: , Rfl:    Misc. Devices MISC, Please provide with shower chair, Disp: 1 Units, Rfl: 0   OXALIPLATIN IV, Inject into the vein., Disp: , Rfl:    prochlorperazine (COMPAZINE) 10 MG tablet, Take 1 tablet (10 mg total) by mouth every 6 (six) hours as needed for nausea or vomiting., Disp: 30 tablet, Rfl: 2   rivaroxaban (XARELTO) 20 MG TABS tablet, Take 1 tablet (20 mg total) by mouth daily with supper., Disp: 30 tablet, Rfl: 1   Allergies: No Known Allergies  REVIEW OF SYSTEMS:   Review of Systems  Constitutional:  Negative for chills, fatigue and fever.  HENT:   Negative for lump/mass, mouth sores, nosebleeds, sore throat and trouble swallowing.   Eyes:  Negative for eye problems.  Respiratory:  Negative for cough and shortness of breath.   Cardiovascular:  Negative for chest pain, leg swelling and palpitations.  Gastrointestinal:  Negative for abdominal pain, constipation, diarrhea, nausea and vomiting.  Genitourinary:  Negative for bladder incontinence, difficulty urinating, dysuria, frequency, hematuria and nocturia.   Musculoskeletal:  Negative for arthralgias, back pain, flank pain, myalgias and neck pain.  Skin:  Negative for itching and rash.  Neurological:  Negative for dizziness,  headaches and numbness.  Hematological:  Does not bruise/bleed easily.  Psychiatric/Behavioral:  Negative for depression, sleep disturbance and suicidal ideas. The patient is not nervous/anxious.   All other systems reviewed and are negative.    VITALS:   There were no vitals taken for this visit.  Wt Readings from Last 3  Encounters:  08/24/22 176 lb 12.9 oz (80.2 kg)  08/09/22 178 lb 3.2 oz (80.8 kg)  08/07/22 167 lb (75.8 kg)    There is no height or weight on file to calculate BMI.  Performance status (ECOG): 1 - Symptomatic but completely ambulatory  PHYSICAL EXAM:   Physical Exam Vitals and nursing note reviewed. Exam conducted with a chaperone present.  Constitutional:      Appearance: Normal appearance.  Cardiovascular:     Rate and Rhythm: Normal rate and regular rhythm.     Pulses: Normal pulses.     Heart sounds: Normal heart sounds.  Pulmonary:     Effort: Pulmonary effort is normal.     Breath sounds: Normal breath sounds.  Abdominal:     Palpations: Abdomen is soft. There is no hepatomegaly, splenomegaly or mass.     Tenderness: There is no abdominal tenderness.  Musculoskeletal:     Right lower leg: No edema.     Left lower leg: No edema.  Lymphadenopathy:     Cervical: No cervical adenopathy.     Right cervical: No superficial, deep or posterior cervical adenopathy.    Left cervical: No superficial, deep or posterior cervical adenopathy.     Upper Body:     Right upper body: No supraclavicular or axillary adenopathy.     Left upper body: No supraclavicular or axillary adenopathy.  Neurological:     General: No focal deficit present.     Mental Status: He is alert and oriented to person, place, and time.  Psychiatric:        Mood and Affect: Mood normal.        Behavior: Behavior normal.     LABS:      Latest Ref Rng & Units 08/11/2022   12:44 PM 08/07/2022    7:45 AM 08/02/2022    8:02 AM  CBC  WBC 4.0 - 10.5 K/uL 7.0  7.1  11.1   Hemoglobin  13.0 - 17.0 g/dL 78.2  95.6  21.3   Hematocrit 39.0 - 52.0 % 44.7  45.2  47.6   Platelets 150 - 400 K/uL 196  201  162       Latest Ref Rng & Units 08/11/2022   12:44 PM 08/07/2022    7:45 AM 08/03/2022    4:29 AM  CMP  Glucose 70 - 99 mg/dL 086  578  469   BUN 8 - 23 mg/dL 17  13  22    Creatinine 0.61 - 1.24 mg/dL 6.29  5.28  4.13   Sodium 135 - 145 mmol/L 137  138  137   Potassium 3.5 - 5.1 mmol/L 4.1  4.3  4.3   Chloride 98 - 111 mmol/L 101  102  104   CO2 22 - 32 mmol/L 26  26  22    Calcium 8.9 - 10.3 mg/dL 8.6  8.7  8.9   Total Protein 6.5 - 8.1 g/dL  7.1    Total Bilirubin 0.3 - 1.2 mg/dL  0.7    Alkaline Phos 38 - 126 U/L  68    AST 15 - 41 U/L  14    ALT 0 - 44 U/L  16       Lab Results  Component Value Date   CEA1 2.6 04/26/2022   /  CEA  Date Value Ref Range Status  04/26/2022 2.6 0.0 - 4.7 ng/mL Final    Comment:    (NOTE)  Nonsmokers          <3.9                             Smokers             <5.6 Roche [employer] Electrochemiluminescence Immunoassay (ECLIA) Values obtained with different assay methods or kits cannot be used interchangeably.  Results cannot be interpreted as absolute evidence of the presence or absence of malignant disease. Performed At: [facility] [address] [place], [place] [id number] Jolene Schimke MD WU:[id number]    No results found for: "PSA1" No results found for: "[id number]" No results found for: "CAN125"  No results found for: "TOTALPROTELP", "ALBUMINELP", "A1GS", "A2GS", "BETS", "BETA2SER", "GAMS", "MSPIKE", "SPEI" No results found for: "TIBC", "FERRITIN", "IRONPCTSAT" No results found for: "LDH"   STUDIES:   EP STUDY  Result Date: 08/23/2022 CONCLUSIONS: 1. Isthmus-dependent right atrial flutter upon presentation. 2. Successful radiofrequency ablation of atrial flutter along the cavotricuspid isthmus with complete bidirectional isthmus block achieved using irrigated 3D  electroanatomical mapping and ablation catheter. 3. No inducible arrhythmias following ablation. 4. No early apparent complications. Lewayne Bunting, MD 10:02 PM 08/23/2022   CT Angio Chest PE W and/or Wo Contrast  Result Date: 08/02/2022 CLINICAL DATA:  77 year old male with clinical suspicion of pulmonary embolism. History of rectal carcinoma. * Tracking Code: BO * EXAM: CT ANGIOGRAPHY CHEST WITH CONTRAST TECHNIQUE: Multidetector CT imaging of the chest was performed using the standard protocol during bolus administration of intravenous contrast. Multiplanar CT image reconstructions and MIPs were obtained to evaluate the vascular anatomy. RADIATION DOSE REDUCTION: This exam was performed according to the departmental dose-optimization program which includes automated exposure control, adjustment of the mA and/or kV according to patient size and/or use of iterative reconstruction technique. CONTRAST:  75mL OMNIPAQUE IOHEXOL 350 MG/ML SOLN COMPARISON:  PET-CT 05/11/2022. FINDINGS: Cardiovascular: No filling defects are noted in the pulmonary arterial tree to suggest pulmonary embolism. Heart size is normal. There is no significant pericardial fluid, thickening or pericardial calcification. There is aortic atherosclerosis, as well as atherosclerosis of the great vessels of the mediastinum and the coronary arteries, including calcified atherosclerotic plaque in the left main, left anterior descending and left circumflex coronary arteries. Right internal jugular single-lumen Port-A-Cath with tip terminating at the superior cavoatrial junction. Mediastinum/Nodes: No pathologically enlarged mediastinal or hilar lymph nodes. Esophagus is unremarkable in appearance. No axillary lymphadenopathy. Lungs/Pleura: No acute consolidative airspace disease. No pleural effusions. No definite suspicious appearing pulmonary nodules or masses are noted. Diffuse bronchial wall thickening with moderate centrilobular and paraseptal  emphysema. Azygous lobe (normal anatomical variant) incidentally noted. Upper Abdomen: Unremarkable. Musculoskeletal: There are no aggressive appearing lytic or blastic lesions noted in the visualized portions of the skeleton. Review of the MIP images confirms the above findings. IMPRESSION: 1. No acute findings in the thorax. Specifically, no evidence of pulmonary embolism. 2. No evidence of metastatic disease to the thorax. 3. Diffuse bronchial wall thickening with moderate centrilobular and paraseptal emphysema; imaging findings suggestive of underlying COPD. 4. Aortic atherosclerosis, in addition to left main and 2 vessel coronary artery disease. Assessment for potential risk factor modification, dietary therapy or pharmacologic therapy may be warranted, if clinically indicated. Aortic Atherosclerosis (ICD10-I70.0) and Emphysema (ICD10-J43.9). Electronically Signed   By: Trudie Reed M.D.   On: 08/02/2022 12:22   DG Chest Portable 1 View  Result Date: 08/02/2022 CLINICAL DATA:  Palpitations.  Tachycardia. EXAM: PORTABLE CHEST 1 VIEW  COMPARISON:  10/03/2005 FINDINGS: Power port in place with the tip in the SVC above the right atrium. Heart size is normal. Mediastinal shadows are normal. The lungs are clear. No edema, infiltrate, collapse or effusion. IMPRESSION: No active disease. Power port in place. Electronically Signed   By: Paulina Fusi M.D.   On: 08/02/2022 11:02

## 2022-08-28 ENCOUNTER — Inpatient Hospital Stay: Payer: PPO

## 2022-08-28 ENCOUNTER — Inpatient Hospital Stay: Payer: PPO | Admitting: Hematology

## 2022-08-28 ENCOUNTER — Other Ambulatory Visit: Payer: Self-pay

## 2022-08-28 VITALS — BP 116/71 | HR 51 | Resp 20

## 2022-08-28 DIAGNOSIS — C20 Malignant neoplasm of rectum: Secondary | ICD-10-CM

## 2022-08-28 DIAGNOSIS — Z5111 Encounter for antineoplastic chemotherapy: Secondary | ICD-10-CM | POA: Diagnosis not present

## 2022-08-28 LAB — CBC WITH DIFFERENTIAL/PLATELET
Abs Immature Granulocytes: 0.03 10*3/uL (ref 0.00–0.07)
Basophils Absolute: 0 10*3/uL (ref 0.0–0.1)
Basophils Relative: 0 %
Eosinophils Absolute: 0.1 10*3/uL (ref 0.0–0.5)
Eosinophils Relative: 2 %
HCT: 37.3 % — ABNORMAL LOW (ref 39.0–52.0)
Hemoglobin: 12.2 g/dL — ABNORMAL LOW (ref 13.0–17.0)
Immature Granulocytes: 1 %
Lymphocytes Relative: 37 %
Lymphs Abs: 2.1 10*3/uL (ref 0.7–4.0)
MCH: 32.5 pg (ref 26.0–34.0)
MCHC: 32.7 g/dL (ref 30.0–36.0)
MCV: 99.5 fL (ref 80.0–100.0)
Monocytes Absolute: 0.8 10*3/uL (ref 0.1–1.0)
Monocytes Relative: 15 %
Neutro Abs: 2.6 10*3/uL (ref 1.7–7.7)
Neutrophils Relative %: 45 %
Platelets: 229 10*3/uL (ref 150–400)
RBC: 3.75 MIL/uL — ABNORMAL LOW (ref 4.22–5.81)
RDW: 15.8 % — ABNORMAL HIGH (ref 11.5–15.5)
WBC: 5.7 10*3/uL (ref 4.0–10.5)
nRBC: 0 % (ref 0.0–0.2)

## 2022-08-28 LAB — COMPREHENSIVE METABOLIC PANEL
ALT: 16 U/L (ref 0–44)
AST: 13 U/L — ABNORMAL LOW (ref 15–41)
Albumin: 3.7 g/dL (ref 3.5–5.0)
Alkaline Phosphatase: 50 U/L (ref 38–126)
Anion gap: 6 (ref 5–15)
BUN: 13 mg/dL (ref 8–23)
CO2: 29 mmol/L (ref 22–32)
Calcium: 8.5 mg/dL — ABNORMAL LOW (ref 8.9–10.3)
Chloride: 105 mmol/L (ref 98–111)
Creatinine, Ser: 0.88 mg/dL (ref 0.61–1.24)
GFR, Estimated: >60 mL/min (ref >60)
Glucose, Bld: 134 mg/dL — ABNORMAL HIGH (ref 70–99)
Potassium: 3.8 mmol/L (ref 3.5–5.1)
Sodium: 140 mmol/L (ref 135–145)
Total Bilirubin: 0.8 mg/dL (ref 0.3–1.2)
Total Protein: 7.1 g/dL (ref 6.5–8.1)

## 2022-08-28 LAB — MAGNESIUM: Magnesium: 2.2 mg/dL (ref 1.7–2.4)

## 2022-08-28 MED ORDER — SODIUM CHLORIDE 0.9 % IV SOLN
2400.0000 mg/m2 | INTRAVENOUS | Status: DC
  Administered 2022-08-28: 5000 mg via INTRAVENOUS
  Filled 2022-08-28: qty 100

## 2022-08-28 MED ORDER — OXALIPLATIN CHEMO INJECTION 100 MG/20ML
85.0000 mg/m2 | Freq: Once | INTRAVENOUS | Status: AC
  Administered 2022-08-28: 170 mg via INTRAVENOUS
  Filled 2022-08-28: qty 20

## 2022-08-28 MED ORDER — SODIUM CHLORIDE 0.9% FLUSH: 10.0000 mL | INTRAVENOUS | Status: DC | PRN

## 2022-08-28 MED ORDER — DEXTROSE 5 % IV SOLN: Freq: Once | INTRAVENOUS | Status: AC

## 2022-08-28 MED ORDER — PALONOSETRON HCL INJECTION 0.25 MG/5ML
0.2500 mg | Freq: Once | INTRAVENOUS | Status: AC
  Administered 2022-08-28: 0.25 mg via INTRAVENOUS
  Filled 2022-08-28: qty 5

## 2022-08-28 MED ORDER — FLUOROURACIL CHEMO INJECTION 2.5 GM/50ML
400.0000 mg/m2 | Freq: Once | INTRAVENOUS | Status: AC
  Administered 2022-08-28: 800 mg via INTRAVENOUS
  Filled 2022-08-28: qty 16

## 2022-08-28 MED ORDER — HEPARIN SOD (PORK) LOCK FLUSH 100 UNIT/ML IV SOLN: 500.0000 [IU] | Freq: Once | INTRAVENOUS | Status: DC | PRN

## 2022-08-28 MED ORDER — LEUCOVORIN CALCIUM INJECTION 350 MG
400.0000 mg/m2 | Freq: Once | INTRAVENOUS | Status: AC
  Administered 2022-08-28: 800 mg via INTRAVENOUS
  Filled 2022-08-28: qty 40

## 2022-08-28 MED ORDER — SODIUM CHLORIDE 0.9 % IV SOLN
10.0000 mg | Freq: Once | INTRAVENOUS | Status: AC
  Administered 2022-08-28: 10 mg via INTRAVENOUS
  Filled 2022-08-28: qty 10

## 2022-08-28 NOTE — Progress Notes (Signed)
MD confirmed full dose chemo today.  Pryor Ochoa, PharmD

## 2022-08-28 NOTE — Patient Instructions (Signed)
[facility]  Discharge Instructions  You were seen and examined today by Dr. Ellin Saba.  Proceed with treatment today as planned.  Follow-up as scheduled.  Thank you for choosing [facility] to provide your oncology and hematology care.   To afford each patient quality time with our provider, please arrive at least 15 minutes before your scheduled appointment time. You may need to reschedule your appointment if you arrive late (10 or more minutes). Arriving late affects you and other patients whose appointments are after yours.  Also, if you miss three or more appointments without notifying the office, you may be dismissed from the clinic at the provider's discretion.    Again, thank you for choosing [facility].  Our hope is that these requests will decrease the amount of time that you wait before being seen by our physicians.   If you have a lab appointment with the Cancer Center - please note that after April 8th, all labs will be drawn in the cancer center.  You do not have to check in or register with the main entrance as you have in the past but will complete your check-in at the cancer center.            _____________________________________________________________  Should you have questions after your visit to [facility], please contact our office at [phone] and follow the prompts.  Our office hours are 8:00 a.m. to 4:30 p.m. Monday - Thursday and 8:00 a.m. to 2:30 p.m. Friday.  Please note that voicemails left after 4:00 p.m. may not be returned until the following business day.  We are closed weekends and all major holidays.  You do have access to a nurse 24-7, just call the main number to the clinic [phone] and do not press any options, hold on the line and a nurse will answer the phone.    For prescription refill requests, have your pharmacy contact our office and allow 72 hours.    Masks  are no longer required in the cancer centers. If you would like for your care team to wear a mask while they are taking care of you, please let them know. You may have one support person who is at least 76 years old accompany you for your appointments.

## 2022-08-28 NOTE — Progress Notes (Signed)
Patient presents today for FOLFOX infusion with 5FU pump start per providers order.  Vital signs and labs reviewed by the MD.  Message received from Kennith Gain RN/Dr. Ellin Saba patient okay for treatment.

## 2022-08-28 NOTE — Progress Notes (Signed)
Patient has been assessed, vital signs and labs have been reviewed by Dr. Katragadda. ANC, Creatinine, LFTs, and Platelets are within treatment parameters per Dr. Katragadda. The patient is good to proceed with treatment at this time. Primary RN and pharmacy aware.  

## 2022-08-28 NOTE — Patient Instructions (Signed)
[facility]  Discharge Instructions: Thank you for choosing [facility] to provide your oncology and hematology care.  If you have a lab appointment with the Cancer Center - please note that after April 8th, 2024, all labs will be drawn in the cancer center.  You do not have to check in or register with the main entrance as you have in the past but will complete your check-in in the cancer center.  Wear comfortable clothing and clothing appropriate for easy access to any Portacath or PICC line.   We strive to give you quality time with your provider. You may need to reschedule your appointment if you arrive late (15 or more minutes).  Arriving late affects you and other patients whose appointments are after yours.  Also, if you miss three or more appointments without notifying the office, you may be dismissed from the clinic at the provider's discretion.      For prescription refill requests, have your pharmacy contact our office and allow 72 hours for refills to be completed.    Today you received the following chemotherapy and/or immunotherapy agents Folfox with 5FU pump start      To help prevent nausea and vomiting after your treatment, we encourage you to take your nausea medication as directed.  BELOW ARE SYMPTOMS THAT SHOULD BE REPORTED IMMEDIATELY: *FEVER GREATER THAN 100.4 F (38 C) OR HIGHER *CHILLS OR SWEATING *NAUSEA AND VOMITING THAT IS NOT CONTROLLED WITH YOUR NAUSEA MEDICATION *UNUSUAL SHORTNESS OF BREATH *UNUSUAL BRUISING OR BLEEDING *URINARY PROBLEMS (pain or burning when urinating, or frequent urination) *BOWEL PROBLEMS (unusual diarrhea, constipation, pain near the anus) TENDERNESS IN MOUTH AND THROAT WITH OR WITHOUT PRESENCE OF ULCERS (sore throat, sores in mouth, or a toothache) UNUSUAL RASH, SWELLING OR PAIN  UNUSUAL VAGINAL DISCHARGE OR ITCHING   Items with * indicate a potential emergency and should be followed up as soon as  possible or go to the Emergency Department if any problems should occur.  Please show the CHEMOTHERAPY ALERT CARD or IMMUNOTHERAPY ALERT CARD at check-in to the Emergency Department and triage nurse.  Should you have questions after your visit or need to cancel or reschedule your appointment, please contact [facility] [phone]  and follow the prompts.  Office hours are 8:00 a.m. to 4:30 p.m. Monday - Friday. Please note that voicemails left after 4:00 p.m. may not be returned until the following business day.  We are closed weekends and major holidays. You have access to a nurse at all times for urgent questions. Please call the main number to the clinic [phone] and follow the prompts.  For any non-urgent questions, you may also contact your provider using MyChart. We now offer e-Visits for anyone 67 and older to request care online for non-urgent symptoms. For details visit [email or web address].   Also download the MyChart app! Go to the app store, search "MyChart", open the app, select [facility], and log in with your MyChart username and password.

## 2022-08-30 ENCOUNTER — Inpatient Hospital Stay: Payer: PPO | Admitting: Hematology

## 2022-08-30 ENCOUNTER — Inpatient Hospital Stay: Payer: PPO

## 2022-08-30 VITALS — BP 94/64 | HR 56 | Temp 96.7°F | Resp 18

## 2022-08-30 DIAGNOSIS — C20 Malignant neoplasm of rectum: Secondary | ICD-10-CM

## 2022-08-30 DIAGNOSIS — Z5111 Encounter for antineoplastic chemotherapy: Secondary | ICD-10-CM | POA: Diagnosis not present

## 2022-08-30 MED ORDER — HEPARIN SOD (PORK) LOCK FLUSH 100 UNIT/ML IV SOLN
500.0000 [IU] | Freq: Once | INTRAVENOUS | Status: AC | PRN
  Administered 2022-08-30: 500 [IU]

## 2022-08-30 MED ORDER — SODIUM CHLORIDE 0.9% FLUSH
10.0000 mL | INTRAVENOUS | Status: DC | PRN
  Administered 2022-08-30: 10 mL

## 2022-08-30 NOTE — Progress Notes (Signed)
Patient presents today for 5FU pump stop and disconnection after 46 hour continous infusion.   5FU pump deaccessed.  Patients port flushed without difficulty.  Good blood return noted with no bruising or swelling noted at site.  Needle removed intact.  Band aid applied.  VSS with discharge and left in satisfactory condition via wheelchair with no s/s of distress noted.    

## 2022-08-30 NOTE — Patient Instructions (Signed)
[facility]  Discharge Instructions: Thank you for choosing [facility] to provide your oncology and hematology care.  If you have a lab appointment with the Cancer Center - please note that after April 8th, 2024, all labs will be drawn in the cancer center.  You do not have to check in or register with the main entrance as you have in the past but will complete your check-in in the cancer center.  Wear comfortable clothing and clothing appropriate for easy access to any Portacath or PICC line.   We strive to give you quality time with your provider. You may need to reschedule your appointment if you arrive late (15 or more minutes).  Arriving late affects you and other patients whose appointments are after yours.  Also, if you miss three or more appointments without notifying the office, you may be dismissed from the clinic at the provider's discretion.      For prescription refill requests, have your pharmacy contact our office and allow 72 hours for refills to be completed.    Today you received the following chemotherapy and/or immunotherapy agents Pump stop      To help prevent nausea and vomiting after your treatment, we encourage you to take your nausea medication as directed.  BELOW ARE SYMPTOMS THAT SHOULD BE REPORTED IMMEDIATELY: *FEVER GREATER THAN 100.4 F (38 C) OR HIGHER *CHILLS OR SWEATING *NAUSEA AND VOMITING THAT IS NOT CONTROLLED WITH YOUR NAUSEA MEDICATION *UNUSUAL SHORTNESS OF BREATH *UNUSUAL BRUISING OR BLEEDING *URINARY PROBLEMS (pain or burning when urinating, or frequent urination) *BOWEL PROBLEMS (unusual diarrhea, constipation, pain near the anus) TENDERNESS IN MOUTH AND THROAT WITH OR WITHOUT PRESENCE OF ULCERS (sore throat, sores in mouth, or a toothache) UNUSUAL RASH, SWELLING OR PAIN  UNUSUAL VAGINAL DISCHARGE OR ITCHING   Items with * indicate a potential emergency and should be followed up as soon as possible or go to the  Emergency Department if any problems should occur.  Please show the CHEMOTHERAPY ALERT CARD or IMMUNOTHERAPY ALERT CARD at check-in to the Emergency Department and triage nurse.  Should you have questions after your visit or need to cancel or reschedule your appointment, please contact [facility] AT [facility] [phone]  and follow the prompts.  Office hours are 8:00 a.m. to 4:30 p.m. Monday - Friday. Please note that voicemails left after 4:00 p.m. may not be returned until the following business day.  We are closed weekends and major holidays. You have access to a nurse at all times for urgent questions. Please call the main number to the clinic [phone] and follow the prompts.  For any non-urgent questions, you may also contact your provider using MyChart. We now offer e-Visits for anyone 3 and older to request care online for non-urgent symptoms. For details visit [email or web address].   Also download the MyChart app! Go to the app store, search "MyChart", open the app, select [employer], and log in with your MyChart username and password.

## 2022-09-01 ENCOUNTER — Inpatient Hospital Stay: Payer: PPO

## 2022-09-04 ENCOUNTER — Inpatient Hospital Stay: Payer: PPO

## 2022-09-04 ENCOUNTER — Inpatient Hospital Stay: Payer: PPO | Admitting: Hematology

## 2022-09-06 ENCOUNTER — Telehealth: Payer: Self-pay

## 2022-09-06 ENCOUNTER — Inpatient Hospital Stay: Payer: PPO

## 2022-09-06 NOTE — Telephone Encounter (Signed)
[facility]  Calling to find out when  TURBT is scheduled for patient.  Call: [phone]

## 2022-09-07 ENCOUNTER — Ambulatory Visit (HOSPITAL_COMMUNITY): Payer: PPO | Admitting: Internal Medicine

## 2022-09-08 NOTE — Telephone Encounter (Signed)
Returned call and made nurse aware patient has not been scheduled

## 2022-09-10 NOTE — Progress Notes (Signed)
[facility] [address], [place] [address]    Clinic Day:  09/11/22    Referring physician: [facility]  Patient Care Team: [place], [facility] as PCP - General Wendall Stade, MD as PCP - Cardiology (Cardiology) Marinus Maw, MD as PCP - Electrophysiology (Cardiology) Therese Sarah, RN as Oncology Nurse Navigator (Medical Oncology) Doreatha Massed, MD as Medical Oncologist (Medical Oncology)   ASSESSMENT & PLAN:   Assessment: 1.  Stage II (T3 N0 M0) low rectal adenocarcinoma, MMR preserved: - 23-month history of intermittent rectal bleeding.  No weight loss. - Colonoscopy (04/26/2022): Large cauliflower mass palpated in the distal rectum on DRE.  Scattered diverticula in the descending colon.  5 mm polyp in the ascending colon.  In the rectum, exophytic semilunar neoplastic appearing process beginning at the anal verge and extending proximally about 6 cm. - Pathology (04/26/2022): Invasive moderately differentiated adenocarcinoma of the rectal mass.  MMR preserved. - CT CAP (05/01/2022): Nodular wall thickening along the rectum.  No intrinsic abnormal lymph nodes.  No liver lesion.  Fatty liver.  Nodule along the left side of the urinary bladder.  Small mass along the course of the distal left ureter.  Worrisome for multifocal TCC.  Small lung nodules measuring up to 4 mm. - MRI pelvis (05/03/2022): 6.4 cm left lateral low rectal tumor abutting the internal anal sphincter, T3c N0.  Distance from tumor to the internal anal sphincter is 0 cm.  Tumor measures 6.4 cm in length and up to 2.2 cm in thickness. - PET scan (05/11/2022): No evidence of metastatic disease. - Met with Dr. Maisie Fus on 05/16/2022: She is in agreement with TNT and has discussed APR with colostomy. - TNT: FOLFOX cycle 1 started on 06/07/2022   2.  Social/family history: -He lives at home and is independent of ADLs and IADLs.  He is accompanied by his son today.  Worked at  [employer] prior to retirement.  Also served in [place] but denied any exposure to agent orange.  Quit smoking 1 year ago.  Smoked 1 to 2 packs/day for the last 61 years.  Started at age 69. - No family history of malignancies.    Plan: 1.  Stage II (T3 cN0 M0) low rectal adenocarcinoma, MMR preserved: - He has completed 6 cycles of chemotherapy. - Denies any neuropathy.  Cold sensitivity lasts about 1 week. - Reviewed labs today: Normal LFTs and creatinine.  CBC grossly normal. - Proceed with cycle 7 today without any dose modifications.  His blood pressure is low at 80/62.  He is asymptomatic.  He will receive final mL of normal saline over 30 minutes.  RTC 2 weeks for follow-up.   2.  Left urinary bladder mass and distal left ureteral mass: - He will have our office cystoscopy on 10/02/2022 with Dr. Ronne Binning. - I have discussed with Dr. Langston Masker.  I would recommend TURBT prior to initiation of radiation.  3.  A-flutter with RVR: - His heart rate is remaining stable since ablation.  He is on Xarelto and amiodarone.  Continues to be on oxygen 4 L/min.     No orders of the defined types were placed in this encounter.     [name],acting as a Neurosurgeon for Doreatha Massed, MD.,have documented all relevant documentation on the behalf of Doreatha Massed, MD,as directed by  Doreatha Massed, MD while in the presence of Doreatha Massed, MD.  I, Doreatha Massed MD,  have reviewed the above documentation for accuracy and completeness, and I agree with the above.     Doreatha Massed, MD   8/5/20246:32 PM  CHIEF COMPLAINT:   Diagnosis: Rectal adenocarcinoma    Cancer Staging  Rectal adenocarcinoma [facility]) Staging form: Colon and Rectum, AJCC 8th Edition - Clinical stage from 05/07/2022: Stage IIA (cT3, cN0, cM0) - Unsigned    Prior Therapy: none  Current Therapy:  Concurrent chemoradiation with FOLFOX, to be followed by surgery    HISTORY  OF PRESENT ILLNESS:   Oncology History  Rectal adenocarcinoma (HCC)  05/07/2022 Initial Diagnosis   Rectal adenocarcinoma (HCC)   06/07/2022 -  Chemotherapy   Patient is on Treatment Plan : COLORECTAL FOLFOX q14d x 4 months        INTERVAL HISTORY:   James Miles is a 77 y.o. male presenting to clinic today for follow up of Rectal adenocarcinoma. He was last seen by me on 08/28/22.  Today, he states that he is doing well overall. His appetite level is at 100%. His energy level is at 60%.  He tolerated his last Folfox treatment well without any major issues. He reports cold sensitivity for 1 week following treatment. He denies neuropathy in the hands and feet, diarrhea, nausea, constipation, or lightheadedness.   Patient reports that last week Dr. Langston Masker told him he would reach out to me and Dr. Ronne Binning.   He has a cystoscopy scheduled on 10/02/22 on with Dr. Ronne Binning. He will see cardiology on 8/15 with Dr. Ladona Ridgel.   PAST MEDICAL HISTORY:   Past Medical History: Past Medical History:  Diagnosis Date   Back pain    COPD (chronic obstructive pulmonary disease) (HCC)    Diabetes mellitus without complication (HCC)    Hyperlipidemia     Surgical History: Past Surgical History:  Procedure Laterality Date   A-FLUTTER ABLATION N/A 08/23/2022   Procedure: A-FLUTTER ABLATION;  Surgeon: Marinus Maw, MD;  Location: [facility] INVASIVE CV LAB;  Service: Cardiovascular;  Laterality: N/A;   BIOPSY  04/26/2022   Procedure: BIOPSY;  Surgeon: Corbin Ade, MD;  Location: [facility] ENDO SUITE;  Service: Endoscopy;;   COLONOSCOPY WITH PROPOFOL N/A 04/26/2022   Procedure: COLONOSCOPY WITH PROPOFOL;  Surgeon: Corbin Ade, MD;  Location: [facility] ENDO SUITE;  Service: Endoscopy;  Laterality: N/A;  10:00am; ASA 3   hemorhoidectomy     IR IMAGING GUIDED PORT INSERTION  05/12/2022   POLYPECTOMY  04/26/2022   Procedure: POLYPECTOMY;  Surgeon: Corbin Ade, MD;  Location: [facility] ENDO SUITE;  Service: Endoscopy;;     Social History: Social History   Socioeconomic History   Marital status: Widowed    Spouse name: Not on file   Number of children: 2   Years of education: Not on file   Highest education level: Not on file  Occupational History   Not on file  Tobacco Use   Smoking status: Former    Current packs/day: 0.00    Types: Cigarettes    Start date: 08/1966    Quit date: 08/2016    Years since quitting: 6.1   Smokeless tobacco: Never  Vaping Use   Vaping status: Never Used  Substance and Sexual Activity   Alcohol use: Never   Drug use: Never   Sexual activity: Not Currently  Other Topics Concern   Not on file  Social History Narrative   Not on file   Social Determinants of Health   Financial Resource Strain: Not on file  Food Insecurity:  No Food Insecurity (07/07/2022)   Hunger Vital Sign    Worried About Running Out of Food in the Last Year: Never true    Ran Out of Food in the Last Year: Never true  Transportation Needs: No Transportation Needs (07/07/2022)   PRAPARE - Administrator, Civil Service (Medical): No    Lack of Transportation (Non-Medical): No  Physical Activity: Not on file  Stress: Not on file  Social Connections: Not on file  Intimate Partner Violence: Not At Risk (07/07/2022)   Humiliation, Afraid, Rape, and Kick questionnaire    Fear of Current or Ex-Partner: No    Emotionally Abused: No    Physically Abused: No    Sexually Abused: No    Family History: No family history on file.  Current Medications:  Current Outpatient Medications:    acidophilus (RISAQUAD) CAPS capsule, Take 1 capsule by mouth daily., Disp: , Rfl:    albuterol (VENTOLIN HFA) 108 (90 Base) MCG/ACT inhaler, Inhale 2 puffs into the lungs every 6 (six) hours as needed for wheezing or shortness of breath., Disp: , Rfl:    allopurinol (ZYLOPRIM) 300 MG tablet, Take 300 mg by mouth daily., Disp: , Rfl:    amiodarone (PACERONE) 200 MG tablet, Take 1 tablet (200 mg total)  by mouth daily., Disp: 30 tablet, Rfl: 3   atorvastatin (LIPITOR) 20 MG tablet, Take 20 mg by mouth daily., Disp: , Rfl:    dextrose 5 % SOLN 1,000 mL with fluorouracil 5 GM/100ML SOLN, Inject into the vein., Disp: , Rfl:    fenofibrate 54 MG tablet, Take 54 mg by mouth daily., Disp: , Rfl:    JARDIANCE 10 MG TABS tablet, Take 25 mg by mouth daily., Disp: , Rfl:    LEUCOVORIN CALCIUM IV, Inject into the vein., Disp: , Rfl:    loperamide (IMODIUM) 2 MG capsule, Take 2 mg by mouth 3 (three) times daily as needed for diarrhea or loose stools., Disp: , Rfl:    MELATONIN PO, Take 1 tablet by mouth at bedtime as needed (sleep)., Disp: , Rfl:    Misc. Devices MISC, Please provide with shower chair, Disp: 1 Units, Rfl: 0   OXALIPLATIN IV, Inject into the vein., Disp: , Rfl:    rivaroxaban (XARELTO) 20 MG TABS tablet, Take 1 tablet (20 mg total) by mouth daily with supper., Disp: 30 tablet, Rfl: 1   lidocaine-prilocaine (EMLA) cream, Apply 1 Application topically as needed. (Patient not taking: Reported on 09/11/2022), Disp: 30 g, Rfl: 0   prochlorperazine (COMPAZINE) 10 MG tablet, Take 1 tablet (10 mg total) by mouth every 6 (six) hours as needed for nausea or vomiting. (Patient not taking: Reported on 09/11/2022), Disp: 30 tablet, Rfl: 2 No current facility-administered medications for this visit.  Facility-Administered Medications Ordered in Other Visits:    fluorouracil (ADRUCIL) 5,000 mg in sodium chloride 0.9 % 150 mL chemo infusion, 2,400 mg/m2 (Treatment Plan Recorded), Intravenous, 1 day or 1 dose, Doreatha Massed, MD, Infusion Verify at 09/11/22 1527   Allergies: No Known Allergies  REVIEW OF SYSTEMS:   Review of Systems  Constitutional:  Negative for chills, fatigue and fever.  HENT:   Negative for lump/mass, mouth sores, nosebleeds, sore throat and trouble swallowing.   Eyes:  Negative for eye problems.  Respiratory:  Positive for shortness of breath. Negative for cough.    Cardiovascular:  Negative for chest pain, leg swelling and palpitations.  Gastrointestinal:  Negative for abdominal pain, constipation, diarrhea, nausea and vomiting.  Genitourinary:  Negative for bladder incontinence, difficulty urinating, dysuria, frequency, hematuria and nocturia.   Musculoskeletal:  Negative for arthralgias, back pain, flank pain, myalgias and neck pain.  Skin:  Negative for itching and rash.  Neurological:  Negative for dizziness, headaches and numbness.  Hematological:  Does not bruise/bleed easily.  Psychiatric/Behavioral:  Negative for depression, sleep disturbance and suicidal ideas. The patient is not nervous/anxious.   All other systems reviewed and are negative.    VITALS:   There were no vitals taken for this visit.  Wt Readings from Last 3 Encounters:  09/11/22 182 lb (82.6 kg)  08/28/22 184 lb (83.5 kg)  08/24/22 176 lb 12.9 oz (80.2 kg)    There is no height or weight on file to calculate BMI.  Performance status (ECOG): 1 - Symptomatic but completely ambulatory  PHYSICAL EXAM:   Physical Exam Vitals and nursing note reviewed. Exam conducted with a chaperone present.  Constitutional:      Appearance: Normal appearance.  Cardiovascular:     Rate and Rhythm: Normal rate and regular rhythm.     Pulses: Normal pulses.     Heart sounds: Normal heart sounds.  Pulmonary:     Effort: Pulmonary effort is normal.     Breath sounds: Normal breath sounds.  Abdominal:     Palpations: Abdomen is soft. There is no hepatomegaly, splenomegaly or mass.     Tenderness: There is no abdominal tenderness.  Musculoskeletal:     Right lower leg: No edema.     Left lower leg: No edema.  Lymphadenopathy:     Cervical: No cervical adenopathy.     Right cervical: No superficial, deep or posterior cervical adenopathy.    Left cervical: No superficial, deep or posterior cervical adenopathy.     Upper Body:     Right upper body: No supraclavicular or axillary  adenopathy.     Left upper body: No supraclavicular or axillary adenopathy.  Neurological:     General: No focal deficit present.     Mental Status: He is alert and oriented to person, place, and time.  Psychiatric:        Mood and Affect: Mood normal.        Behavior: Behavior normal.     LABS:      Latest Ref Rng & Units 09/11/2022    8:16 AM 08/28/2022    7:53 AM 08/11/2022   12:44 PM  CBC  WBC 4.0 - 10.5 K/uL 6.5  5.7  7.0   Hemoglobin 13.0 - 17.0 g/dL 16.1  09.6  04.5   Hematocrit 39.0 - 52.0 % 38.9  37.3  44.7   Platelets 150 - 400 K/uL 153  229  196       Latest Ref Rng & Units 09/11/2022    8:16 AM 08/28/2022    7:53 AM 08/11/2022   12:44 PM  CMP  Glucose 70 - 99 mg/dL 409  811  914   BUN 8 - 23 mg/dL 18  13  17    Creatinine 0.61 - 1.24 mg/dL 7.82  9.56  2.13   Sodium 135 - 145 mmol/L 137  140  137   Potassium 3.5 - 5.1 mmol/L 3.7  3.8  4.1   Chloride 98 - 111 mmol/L 105  105  101   CO2 22 - 32 mmol/L 27  29  26    Calcium 8.9 - 10.3 mg/dL 8.5  8.5  8.6   Total Protein 6.5 - 8.1 g/dL 7.0  7.1    Total Bilirubin 0.3 - 1.2 mg/dL 0.5  0.8    Alkaline Phos 38 - 126 U/L 58  50    AST 15 - 41 U/L 13  13    ALT 0 - 44 U/L 15  16       Lab Results  Component Value Date   CEA1 2.6 04/26/2022   /  CEA  Date Value Ref Range Status  04/26/2022 2.6 0.0 - 4.7 ng/mL Final    Comment:    (NOTE)                             Nonsmokers          <3.9                             Smokers             <5.6 Roche [employer] Electrochemiluminescence Immunoassay (ECLIA) Values obtained with different assay methods or kits cannot be used interchangeably.  Results cannot be interpreted as absolute evidence of the presence or absence of malignant disease. Performed At: [facility] [address] [place], [place] [id number] Jolene Schimke MD WU:[id number]    No results found for: "PSA1" No results found for: [phone]" No results found for: "CAN125"  No results found for:  "TOTALPROTELP", "ALBUMINELP", "A1GS", "A2GS", "BETS", "BETA2SER", "GAMS", "MSPIKE", "SPEI" No results found for: "TIBC", "FERRITIN", "IRONPCTSAT" No results found for: "LDH"   STUDIES:   EP STUDY  Result Date: 08/23/2022 CONCLUSIONS: 1. Isthmus-dependent right atrial flutter upon presentation. 2. Successful radiofrequency ablation of atrial flutter along the cavotricuspid isthmus with complete bidirectional isthmus block achieved using irrigated 3D electroanatomical mapping and ablation catheter. 3. No inducible arrhythmias following ablation. 4. No early apparent complications. Lewayne Bunting, MD 10:02 PM 08/23/2022

## 2022-09-11 ENCOUNTER — Inpatient Hospital Stay: Payer: PPO | Admitting: Hematology

## 2022-09-11 ENCOUNTER — Inpatient Hospital Stay: Payer: PPO

## 2022-09-11 ENCOUNTER — Inpatient Hospital Stay: Payer: PPO | Attending: Hematology

## 2022-09-11 VITALS — BP 135/70 | HR 58 | Temp 97.6°F | Resp 18 | Ht 71.0 in | Wt 182.0 lb

## 2022-09-11 DIAGNOSIS — Z79899 Other long term (current) drug therapy: Secondary | ICD-10-CM | POA: Diagnosis not present

## 2022-09-11 DIAGNOSIS — C20 Malignant neoplasm of rectum: Secondary | ICD-10-CM | POA: Diagnosis present

## 2022-09-11 DIAGNOSIS — I4892 Unspecified atrial flutter: Secondary | ICD-10-CM | POA: Diagnosis not present

## 2022-09-11 DIAGNOSIS — Z7901 Long term (current) use of anticoagulants: Secondary | ICD-10-CM | POA: Diagnosis not present

## 2022-09-11 DIAGNOSIS — R0602 Shortness of breath: Secondary | ICD-10-CM | POA: Diagnosis not present

## 2022-09-11 DIAGNOSIS — Z452 Encounter for adjustment and management of vascular access device: Secondary | ICD-10-CM | POA: Diagnosis not present

## 2022-09-11 DIAGNOSIS — N2889 Other specified disorders of kidney and ureter: Secondary | ICD-10-CM | POA: Diagnosis not present

## 2022-09-11 DIAGNOSIS — Z5111 Encounter for antineoplastic chemotherapy: Secondary | ICD-10-CM | POA: Insufficient documentation

## 2022-09-11 DIAGNOSIS — Z87891 Personal history of nicotine dependence: Secondary | ICD-10-CM | POA: Insufficient documentation

## 2022-09-11 LAB — COMPREHENSIVE METABOLIC PANEL
ALT: 15 U/L (ref 0–44)
AST: 13 U/L — ABNORMAL LOW (ref 15–41)
Albumin: 3.9 g/dL (ref 3.5–5.0)
Alkaline Phosphatase: 58 U/L (ref 38–126)
Anion gap: 5 (ref 5–15)
BUN: 18 mg/dL (ref 8–23)
CO2: 27 mmol/L (ref 22–32)
Calcium: 8.5 mg/dL — ABNORMAL LOW (ref 8.9–10.3)
Chloride: 105 mmol/L (ref 98–111)
Creatinine, Ser: 0.95 mg/dL (ref 0.61–1.24)
GFR, Estimated: >60 mL/min (ref >60)
Glucose, Bld: 140 mg/dL — ABNORMAL HIGH (ref 70–99)
Potassium: 3.7 mmol/L (ref 3.5–5.1)
Sodium: 137 mmol/L (ref 135–145)
Total Bilirubin: 0.5 mg/dL (ref 0.3–1.2)
Total Protein: 7 g/dL (ref 6.5–8.1)

## 2022-09-11 LAB — CBC WITH DIFFERENTIAL/PLATELET
Abs Immature Granulocytes: 0.02 10*3/uL (ref 0.00–0.07)
Basophils Absolute: 0 10*3/uL (ref 0.0–0.1)
Basophils Relative: 0 %
Eosinophils Absolute: 0.1 10*3/uL (ref 0.0–0.5)
Eosinophils Relative: 1 %
HCT: 38.9 % — ABNORMAL LOW (ref 39.0–52.0)
Hemoglobin: 12.9 g/dL — ABNORMAL LOW (ref 13.0–17.0)
Immature Granulocytes: 0 %
Lymphocytes Relative: 35 %
Lymphs Abs: 2.3 10*3/uL (ref 0.7–4.0)
MCH: 32.9 pg (ref 26.0–34.0)
MCHC: 33.2 g/dL (ref 30.0–36.0)
MCV: 99.2 fL (ref 80.0–100.0)
Monocytes Absolute: 0.6 10*3/uL (ref 0.1–1.0)
Monocytes Relative: 9 %
Neutro Abs: 3.5 10*3/uL (ref 1.7–7.7)
Neutrophils Relative %: 55 %
Platelets: 153 10*3/uL (ref 150–400)
RBC: 3.92 MIL/uL — ABNORMAL LOW (ref 4.22–5.81)
RDW: 15.5 % (ref 11.5–15.5)
WBC: 6.5 10*3/uL (ref 4.0–10.5)
nRBC: 0 % (ref 0.0–0.2)

## 2022-09-11 LAB — MAGNESIUM: Magnesium: 2.1 mg/dL (ref 1.7–2.4)

## 2022-09-11 MED ORDER — FLUOROURACIL CHEMO INJECTION 2.5 GM/50ML
400.0000 mg/m2 | Freq: Once | INTRAVENOUS | Status: AC
  Administered 2022-09-11: 800 mg via INTRAVENOUS
  Filled 2022-09-11: qty 16

## 2022-09-11 MED ORDER — OXALIPLATIN CHEMO INJECTION 100 MG/20ML
85.0000 mg/m2 | Freq: Once | INTRAVENOUS | Status: AC
  Administered 2022-09-11: 170 mg via INTRAVENOUS
  Filled 2022-09-11: qty 34

## 2022-09-11 MED ORDER — PALONOSETRON HCL INJECTION 0.25 MG/5ML
0.2500 mg | Freq: Once | INTRAVENOUS | Status: AC
  Administered 2022-09-11: 0.25 mg via INTRAVENOUS
  Filled 2022-09-11: qty 5

## 2022-09-11 MED ORDER — SODIUM CHLORIDE 0.9 % IV SOLN
2400.0000 mg/m2 | INTRAVENOUS | Status: DC
  Administered 2022-09-11: 5000 mg via INTRAVENOUS
  Filled 2022-09-11: qty 100

## 2022-09-11 MED ORDER — SODIUM CHLORIDE 0.9 % IV SOLN
10.0000 mg | Freq: Once | INTRAVENOUS | Status: AC
  Administered 2022-09-11: 10 mg via INTRAVENOUS
  Filled 2022-09-11: qty 1

## 2022-09-11 MED ORDER — LEUCOVORIN CALCIUM INJECTION 350 MG
400.0000 mg/m2 | Freq: Once | INTRAVENOUS | Status: AC
  Administered 2022-09-11: 800 mg via INTRAVENOUS
  Filled 2022-09-11: qty 40

## 2022-09-11 MED ORDER — DEXTROSE 5 % IV SOLN: Freq: Once | INTRAVENOUS | Status: AC

## 2022-09-11 MED ORDER — SODIUM CHLORIDE 0.9 % IV SOLN: Freq: Once | INTRAVENOUS | Status: AC

## 2022-09-11 NOTE — Progress Notes (Signed)
Patient presents today for treatment ( Folfox ) and follow up visit with Dr. Ellin Saba. Labs pending. BP on arrival 81/62 . Patient asymptomatic. Patient has no complaints of any side effects related to last treatment.   Message received from [facility] RN / Dr. Ellin Saba to proceed with treatment. Orders received to give 500 mls of normal saline over 30 minutes for BP of 81/62.    Treatment given today per MD orders. Tolerated infusion without adverse affects. Vital signs stable. 5FU pump placed and RUN noted on the screen. No complaints at this time. Discharged from clinic ambulatory in stable condition. Alert and oriented x 3. F/U with [facility] as scheduled.

## 2022-09-11 NOTE — Patient Instructions (Signed)
[facility]  Discharge Instructions  You were seen and examined today by Dr. Ellin Saba.  Dr. Ellin Saba discussed your most recent lab work which is stable.  Proceed with treatment as scheduled.  Follow-up as scheduled.  Thank you for choosing [facility] to provide your oncology and hematology care.   To afford each patient quality time with our provider, please arrive at least 15 minutes before your scheduled appointment time. You may need to reschedule your appointment if you arrive late (10 or more minutes). Arriving late affects you and other patients whose appointments are after yours.  Also, if you miss three or more appointments without notifying the office, you may be dismissed from the clinic at the provider's discretion.    Again, thank you for choosing [facility].  Our hope is that these requests will decrease the amount of time that you wait before being seen by our physicians.   If you have a lab appointment with the Cancer Center - please note that after April 8th, all labs will be drawn in the cancer center.  You do not have to check in or register with the main entrance as you have in the past but will complete your check-in at the cancer center.            _____________________________________________________________  Should you have questions after your visit to [facility], please contact our office at [phone] and follow the prompts.  Our office hours are 8:00 a.m. to 4:30 p.m. Monday - Thursday and 8:00 a.m. to 2:30 p.m. Friday.  Please note that voicemails left after 4:00 p.m. may not be returned until the following business day.  We are closed weekends and all major holidays.  You do have access to a nurse 24-7, just call the main number to the clinic [phone] and do not press any options, hold on the line and a nurse will answer the phone.    For prescription refill requests,  have your pharmacy contact our office and allow 72 hours.    Masks are no longer required in the cancer centers. If you would like for your care team to wear a mask while they are taking care of you, please let them know. You may have one support person who is at least 76 years old accompany you for your appointments.

## 2022-09-11 NOTE — Patient Instructions (Signed)
[facility]  Discharge Instructions: Thank you for choosing [facility] to provide your oncology and hematology care.  If you have a lab appointment with the Cancer Center - please note that after April 8th, 2024, all labs will be drawn in the cancer center.  You do not have to check in or register with the main entrance as you have in the past but will complete your check-in in the cancer center.  Wear comfortable clothing and clothing appropriate for easy access to any Portacath or PICC line.   We strive to give you quality time with your provider. You may need to reschedule your appointment if you arrive late (15 or more minutes).  Arriving late affects you and other patients whose appointments are after yours.  Also, if you miss three or more appointments without notifying the office, you may be dismissed from the clinic at the provider's discretion.      For prescription refill requests, have your pharmacy contact our office and allow 72 hours for refills to be completed.    Today you received the following chemotherapy and/or immunotherapy agents Folfox   To help prevent nausea and vomiting after your treatment, we encourage you to take your nausea medication as directed.  BELOW ARE SYMPTOMS THAT SHOULD BE REPORTED IMMEDIATELY: *FEVER GREATER THAN 100.4 F (38 C) OR HIGHER *CHILLS OR SWEATING *NAUSEA AND VOMITING THAT IS NOT CONTROLLED WITH YOUR NAUSEA MEDICATION *UNUSUAL SHORTNESS OF BREATH *UNUSUAL BRUISING OR BLEEDING *URINARY PROBLEMS (pain or burning when urinating, or frequent urination) *BOWEL PROBLEMS (unusual diarrhea, constipation, pain near the anus) TENDERNESS IN MOUTH AND THROAT WITH OR WITHOUT PRESENCE OF ULCERS (sore throat, sores in mouth, or a toothache) UNUSUAL RASH, SWELLING OR PAIN  UNUSUAL VAGINAL DISCHARGE OR ITCHING   Items with * indicate a potential emergency and should be followed up as soon as possible or go to the  Emergency Department if any problems should occur.  Please show the CHEMOTHERAPY ALERT CARD or IMMUNOTHERAPY ALERT CARD at check-in to the Emergency Department and triage nurse.  Should you have questions after your visit or need to cancel or reschedule your appointment, please contact [facility] CENTER AT [facility] [phone]  and follow the prompts.  Office hours are 8:00 a.m. to 4:30 p.m. Monday - Friday. Please note that voicemails left after 4:00 p.m. may not be returned until the following business day.  We are closed weekends and major holidays. You have access to a nurse at all times for urgent questions. Please call the main number to the clinic [phone] and follow the prompts.  For any non-urgent questions, you may also contact your provider using MyChart. We now offer e-Visits for anyone 40 and older to request care online for non-urgent symptoms. For details visit [email or web address].   Also download the MyChart app! Go to the app store, search "MyChart", open the app, select [facility], and log in with your MyChart username and password.

## 2022-09-11 NOTE — Progress Notes (Signed)
Patient has been assessed, vital signs and labs have been reviewed by Dr. Ellin Saba. ANC, Creatinine, LFTs, and Platelets are within treatment parameters per Dr. Ellin Saba. The patient is good to proceed with treatment at this time. Patient to receive NS over due to decreased BP. Primary RN and pharmacy aware.

## 2022-09-13 ENCOUNTER — Inpatient Hospital Stay: Payer: PPO

## 2022-09-13 ENCOUNTER — Inpatient Hospital Stay: Payer: PPO | Admitting: Hematology

## 2022-09-13 VITALS — BP 111/64 | HR 59 | Temp 96.6°F | Resp 18

## 2022-09-13 DIAGNOSIS — C20 Malignant neoplasm of rectum: Secondary | ICD-10-CM

## 2022-09-13 DIAGNOSIS — Z5111 Encounter for antineoplastic chemotherapy: Secondary | ICD-10-CM | POA: Diagnosis not present

## 2022-09-13 MED ORDER — SODIUM CHLORIDE 0.9% FLUSH
10.0000 mL | INTRAVENOUS | Status: DC | PRN
  Administered 2022-09-13: 10 mL

## 2022-09-13 MED ORDER — HEPARIN SOD (PORK) LOCK FLUSH 100 UNIT/ML IV SOLN
500.0000 [IU] | Freq: Once | INTRAVENOUS | Status: AC | PRN
  Administered 2022-09-13: 500 [IU]

## 2022-09-15 ENCOUNTER — Inpatient Hospital Stay: Payer: PPO

## 2022-09-18 ENCOUNTER — Inpatient Hospital Stay: Payer: PPO

## 2022-09-18 ENCOUNTER — Inpatient Hospital Stay: Payer: PPO | Admitting: Hematology

## 2022-09-20 ENCOUNTER — Inpatient Hospital Stay: Payer: PPO

## 2022-09-21 ENCOUNTER — Ambulatory Visit: Payer: PPO | Attending: Internal Medicine | Admitting: Internal Medicine

## 2022-09-21 ENCOUNTER — Ambulatory Visit (HOSPITAL_COMMUNITY): Payer: PPO | Admitting: Internal Medicine

## 2022-09-21 VITALS — BP 108/64 | HR 68 | Ht 71.0 in | Wt 180.2 lb

## 2022-09-21 DIAGNOSIS — I4892 Unspecified atrial flutter: Secondary | ICD-10-CM | POA: Diagnosis not present

## 2022-09-21 NOTE — Progress Notes (Signed)
HPI [name] returns today for followup of atrial flutter s/p catheter ablation. He is a pleasnat 77 yo man with COPD and atrial flutter who underwent EP study and catheter ablation about 4 weeks ago. In the interim he notes he is improved. He has not had palpitations. His dyspnea is better.  No Known Allergies   Current Outpatient Medications  Medication Sig Dispense Refill   acidophilus (RISAQUAD) CAPS capsule Take 1 capsule by mouth daily.     albuterol (VENTOLIN HFA) 108 (90 Base) MCG/ACT inhaler Inhale 2 puffs into the lungs every 6 (six) hours as needed for wheezing or shortness of breath.     allopurinol (ZYLOPRIM) 300 MG tablet Take 300 mg by mouth daily.     amiodarone (PACERONE) 200 MG tablet Take 1 tablet (200 mg total) by mouth daily. 30 tablet 3   atorvastatin (LIPITOR) 20 MG tablet Take 20 mg by mouth daily.     dextrose 5 % SOLN 1,000 mL with fluorouracil 5 GM/100ML SOLN Inject into the vein.     fenofibrate 54 MG tablet Take 54 mg by mouth daily.     JARDIANCE 10 MG TABS tablet Take 25 mg by mouth daily.     LEUCOVORIN CALCIUM IV Inject into the vein.     lidocaine-prilocaine (EMLA) cream Apply 1 Application topically as needed. 30 g 0   loperamide (IMODIUM) 2 MG capsule Take 2 mg by mouth 3 (three) times daily as needed for diarrhea or loose stools.     MELATONIN PO Take 1 tablet by mouth at bedtime as needed (sleep).     Misc. Devices MISC Please provide with shower chair 1 Units 0   OXALIPLATIN IV Inject into the vein.     prochlorperazine (COMPAZINE) 10 MG tablet Take 1 tablet (10 mg total) by mouth every 6 (six) hours as needed for nausea or vomiting. 30 tablet 2   rivaroxaban (XARELTO) 20 MG TABS tablet Take 1 tablet (20 mg total) by mouth daily with supper. 30 tablet 1   No current facility-administered medications for this visit.     Past Medical History:  Diagnosis Date   Back pain    COPD (chronic obstructive pulmonary disease) (HCC)    Diabetes  mellitus without complication (HCC)    Hyperlipidemia     ROS:   All systems reviewed and negative except as noted in the HPI.   Past Surgical History:  Procedure Laterality Date   A-FLUTTER ABLATION N/A 08/23/2022   Procedure: A-FLUTTER ABLATION;  Surgeon: Marinus Maw, MD;  Location: [facility] INVASIVE CV LAB;  Service: Cardiovascular;  Laterality: N/A;   BIOPSY  04/26/2022   Procedure: BIOPSY;  Surgeon: Corbin Ade, MD;  Location: [facility];  Service: Endoscopy;;   COLONOSCOPY WITH PROPOFOL N/A 04/26/2022   Procedure: COLONOSCOPY WITH PROPOFOL;  Surgeon: Corbin Ade, MD;  Location: [facility];  Service: Endoscopy;  Laterality: N/A;  10:00am; ASA 3   hemorhoidectomy     IR IMAGING GUIDED PORT INSERTION  05/12/2022   POLYPECTOMY  04/26/2022   Procedure: POLYPECTOMY;  Surgeon: Corbin Ade, MD;  Location: [facility];  Service: Endoscopy;;     No family history on file.   Social History   Socioeconomic History   Marital status: Widowed    Spouse name: Not on file   Number of children: 2   Years of education: Not on file   Highest education level: Not on file  Occupational  History   Not on file  Tobacco Use   Smoking status: Former    Current packs/day: 0.00    Types: Cigarettes    Start date: 08/1966    Quit date: 08/2016    Years since quitting: 6.1   Smokeless tobacco: Never  Vaping Use   Vaping status: Never Used  Substance and Sexual Activity   Alcohol use: Never   Drug use: Never   Sexual activity: Not Currently  Other Topics Concern   Not on file  Social History Narrative   Not on file   Social Determinants of Health   Financial Resource Strain: Not on file  Food Insecurity: No Food Insecurity (07/07/2022)   Hunger Vital Sign    Worried About Running Out of Food in the Last Year: Never true    Ran Out of Food in the Last Year: Never true  Transportation Needs: No Transportation Needs (07/07/2022)   PRAPARE - Scientist, research (physical sciences) (Medical): No    Lack of Transportation (Non-Medical): No  Physical Activity: Not on file  Stress: Not on file  Social Connections: Not on file  Intimate Partner Violence: Not At Risk (07/07/2022)   Humiliation, Afraid, Rape, and Kick questionnaire    Fear of Current or Ex-Partner: No    Emotionally Abused: No    Physically Abused: No    Sexually Abused: No     BP 108/64   Pulse 68   Ht 5\' 11"  (1.803 m)   Wt 180 lb 3.2 oz (81.7 kg)   SpO2 97%   BMI 25.13 kg/m   Physical Exam:  Well appearing NAD HEENT: Unremarkable Neck:  No JVD, no thyromegally Lymphatics:  No adenopathy Back:  No CVA tenderness Lungs:  scattered wheezes and rhochi HEART:  Regular rate rhythm, no murmurs, no rubs, no clicks Abd:  soft, positive bowel sounds, no organomegally, no rebound, no guarding Ext:  2 plus pulses, no edema, no cyanosis, no clubbing Skin:  No rashes no nodules Neuro:  CN II through XII intact, motor grossly intact  EKG - nsr   Assess/Plan: Atrial flutter - he is s/p ablation. He is maintaining NSR. He will stop the amiodarone.  COPD - stable. Continue bronchodilators Coags - He will stop xarelto when he runs out of the current script.   Sharlot Gowda ,MD

## 2022-09-21 NOTE — Patient Instructions (Signed)
Medication Instructions:   Stop Taking Amiodarone  Stop Taking Xarelto when current supply is used  *If you need a refill on your cardiac medications before your next appointment, please call your pharmacy*   Lab Work: NONE   If you have labs (blood work) drawn today and your tests are completely normal, you will receive your results only by: MyChart Message (if you have MyChart) OR A paper copy in the mail If you have any lab test that is abnormal or we need to change your treatment, we will call you to review the results.   Testing/Procedures: NONE    Follow-Up: At [facility], you and your health needs are our priority.  As part of our continuing mission to provide you with exceptional heart care, we have created designated Provider Care Teams.  These Care Teams include your primary Cardiologist (physician) and Advanced Practice Providers (APPs -  Physician Assistants and Nurse Practitioners) who all work together to provide you with the care you need, when you need it.  We recommend signing up for the patient portal called "MyChart".  Sign up information is provided on this After Visit Summary.  MyChart is used to connect with patients for Virtual Visits (Telemedicine).  Patients are able to view lab/test results, encounter notes, upcoming appointments, etc.  Non-urgent messages can be sent to your provider as well.   To learn more about what you can do with MyChart, go to [email or web address].    Your next appointment:   6 month(s)  Provider:   Lewayne Bunting, MD    Other Instructions Thank you for choosing [facility]!

## 2022-09-23 ENCOUNTER — Other Ambulatory Visit: Payer: Self-pay

## 2022-09-25 ENCOUNTER — Other Ambulatory Visit: Payer: Self-pay

## 2022-09-25 ENCOUNTER — Inpatient Hospital Stay: Payer: PPO | Admitting: Hematology

## 2022-09-25 ENCOUNTER — Telehealth: Payer: Self-pay | Admitting: Pharmacist

## 2022-09-25 ENCOUNTER — Other Ambulatory Visit: Payer: Self-pay | Admitting: *Deleted

## 2022-09-25 ENCOUNTER — Inpatient Hospital Stay: Payer: PPO

## 2022-09-25 ENCOUNTER — Other Ambulatory Visit (HOSPITAL_COMMUNITY): Payer: Self-pay

## 2022-09-25 VITALS — BP 94/70 | HR 64 | Temp 97.4°F | Resp 18

## 2022-09-25 VITALS — BP 143/78 | HR 57 | Temp 97.5°F | Resp 16 | Wt 173.8 lb

## 2022-09-25 DIAGNOSIS — R300 Dysuria: Secondary | ICD-10-CM

## 2022-09-25 DIAGNOSIS — C20 Malignant neoplasm of rectum: Secondary | ICD-10-CM

## 2022-09-25 DIAGNOSIS — Z95828 Presence of other vascular implants and grafts: Secondary | ICD-10-CM

## 2022-09-25 DIAGNOSIS — Z5111 Encounter for antineoplastic chemotherapy: Secondary | ICD-10-CM | POA: Diagnosis not present

## 2022-09-25 LAB — URINALYSIS, ROUTINE W REFLEX MICROSCOPIC
Bilirubin Urine: NEGATIVE
Glucose, UA: >=500 mg/dL — AB
Ketones, ur: 5 mg/dL — AB
Nitrite: POSITIVE — AB
Protein, ur: 100 mg/dL — AB
Specific Gravity, Urine: 1.026 (ref 1.005–1.030)
WBC, UA: >50 WBC/hpf (ref 0–5)
pH: 5 (ref 5.0–8.0)

## 2022-09-25 LAB — COMPREHENSIVE METABOLIC PANEL
ALT: 21 U/L (ref 0–44)
AST: 16 U/L (ref 15–41)
Albumin: 3.5 g/dL (ref 3.5–5.0)
Alkaline Phosphatase: 64 U/L (ref 38–126)
Anion gap: 12 (ref 5–15)
BUN: 9 mg/dL (ref 8–23)
CO2: 27 mmol/L (ref 22–32)
Calcium: 9.1 mg/dL (ref 8.9–10.3)
Chloride: 103 mmol/L (ref 98–111)
Creatinine, Ser: 0.87 mg/dL (ref 0.61–1.24)
GFR, Estimated: >60 mL/min (ref >60)
Glucose, Bld: 124 mg/dL — ABNORMAL HIGH (ref 70–99)
Potassium: 3.4 mmol/L — ABNORMAL LOW (ref 3.5–5.1)
Sodium: 142 mmol/L (ref 135–145)
Total Bilirubin: 0.7 mg/dL (ref 0.3–1.2)
Total Protein: 7.5 g/dL (ref 6.5–8.1)

## 2022-09-25 LAB — CBC WITH DIFFERENTIAL/PLATELET
Abs Immature Granulocytes: 0.06 10*3/uL (ref 0.00–0.07)
Basophils Absolute: 0 10*3/uL (ref 0.0–0.1)
Basophils Relative: 0 %
Eosinophils Absolute: 0.1 10*3/uL (ref 0.0–0.5)
Eosinophils Relative: 1 %
HCT: 38.1 % — ABNORMAL LOW (ref 39.0–52.0)
Hemoglobin: 12.4 g/dL — ABNORMAL LOW (ref 13.0–17.0)
Immature Granulocytes: 1 %
Lymphocytes Relative: 29 %
Lymphs Abs: 2 10*3/uL (ref 0.7–4.0)
MCH: 32.5 pg (ref 26.0–34.0)
MCHC: 32.5 g/dL (ref 30.0–36.0)
MCV: 99.7 fL (ref 80.0–100.0)
Monocytes Absolute: 1.3 10*3/uL — ABNORMAL HIGH (ref 0.1–1.0)
Monocytes Relative: 19 %
Neutro Abs: 3.4 10*3/uL (ref 1.7–7.7)
Neutrophils Relative %: 50 %
Platelets: 217 10*3/uL (ref 150–400)
RBC: 3.82 MIL/uL — ABNORMAL LOW (ref 4.22–5.81)
RDW: 14.8 % (ref 11.5–15.5)
WBC: 6.9 10*3/uL (ref 4.0–10.5)
nRBC: 0 % (ref 0.0–0.2)

## 2022-09-25 LAB — MAGNESIUM: Magnesium: 2.2 mg/dL (ref 1.7–2.4)

## 2022-09-25 MED ORDER — PALONOSETRON HCL INJECTION 0.25 MG/5ML
0.2500 mg | Freq: Once | INTRAVENOUS | Status: AC
  Administered 2022-09-25: 0.25 mg via INTRAVENOUS
  Filled 2022-09-25: qty 5

## 2022-09-25 MED ORDER — CAPECITABINE 500 MG PO TABS
1500.0000 mg | ORAL_TABLET | Freq: Two times a day (BID) | ORAL | 0 refills | Status: DC
Start: 2022-09-25 — End: 2022-12-26
  Filled 2022-09-25 – 2022-10-02 (×2): qty 168, 28d supply, fill #0

## 2022-09-25 MED ORDER — FLUOROURACIL CHEMO INJECTION 2.5 GM/50ML
400.0000 mg/m2 | Freq: Once | INTRAVENOUS | Status: AC
  Administered 2022-09-25: 800 mg via INTRAVENOUS
  Filled 2022-09-25: qty 16

## 2022-09-25 MED ORDER — HEPARIN SOD (PORK) LOCK FLUSH 100 UNIT/ML IV SOLN: 500.0000 [IU] | Freq: Once | INTRAVENOUS | Status: DC

## 2022-09-25 MED ORDER — SODIUM CHLORIDE 0.9 % IV SOLN
10.0000 mg | Freq: Once | INTRAVENOUS | Status: AC
  Administered 2022-09-25: 10 mg via INTRAVENOUS
  Filled 2022-09-25: qty 10

## 2022-09-25 MED ORDER — SODIUM CHLORIDE 0.9% FLUSH
10.0000 mL | INTRAVENOUS | Status: DC | PRN
  Administered 2022-09-25: 10 mL via INTRAVENOUS

## 2022-09-25 MED ORDER — SODIUM CHLORIDE 0.9 % IV SOLN
2400.0000 mg/m2 | INTRAVENOUS | Status: DC
  Administered 2022-09-25: 5000 mg via INTRAVENOUS
  Filled 2022-09-25: qty 100

## 2022-09-25 MED ORDER — POTASSIUM CHLORIDE CRYS ER 20 MEQ PO TBCR
40.0000 meq | EXTENDED_RELEASE_TABLET | Freq: Once | ORAL | Status: AC
  Administered 2022-09-25: 40 meq via ORAL
  Filled 2022-09-25: qty 2

## 2022-09-25 MED ORDER — LEUCOVORIN CALCIUM INJECTION 350 MG
400.0000 mg/m2 | Freq: Once | INTRAVENOUS | Status: AC
  Administered 2022-09-25: 800 mg via INTRAVENOUS
  Filled 2022-09-25: qty 40

## 2022-09-25 MED ORDER — OXALIPLATIN CHEMO INJECTION 100 MG/20ML
85.0000 mg/m2 | Freq: Once | INTRAVENOUS | Status: AC
  Administered 2022-09-25: 170 mg via INTRAVENOUS
  Filled 2022-09-25: qty 34

## 2022-09-25 MED ORDER — CIPROFLOXACIN HCL 500 MG PO TABS: 500.0000 mg | ORAL_TABLET | Freq: Two times a day (BID) | ORAL | 0 refills | Status: DC

## 2022-09-25 MED ORDER — CAPECITABINE 500 MG PO TABS
1500.0000 mg | ORAL_TABLET | Freq: Two times a day (BID) | ORAL | 0 refills | Status: DC
Start: 2022-09-25 — End: 2022-09-25
  Filled 2022-09-25: qty 168, 28d supply, fill #0

## 2022-09-25 MED ORDER — DEXTROSE 5 % IV SOLN: Freq: Once | INTRAVENOUS | Status: AC

## 2022-09-25 NOTE — Progress Notes (Signed)
Patient has been examined by Dr. Katragadda. Vital signs and labs have been reviewed by MD - ANC, Creatinine, LFTs, hemoglobin, and platelets are within treatment parameters per M.D. - pt may proceed with treatment.  Primary RN and pharmacy notified.  

## 2022-09-25 NOTE — Patient Instructions (Signed)
 [facility]  Discharge Instructions: Thank you for choosing [facility] to provide your oncology and hematology care.  If you have a lab appointment with the Cancer Center - please note that after April 8th, 2024, all labs will be drawn in the cancer center.  You do not have to check in or register with the main entrance as you have in the past but will complete your check-in in the cancer center.  Wear comfortable clothing and clothing appropriate for easy access to any Portacath or PICC line.   We strive to give you quality time with your provider. You may need to reschedule your appointment if you arrive late (15 or more minutes).  Arriving late affects you and other patients whose appointments are after yours.  Also, if you miss three or more appointments without notifying the office, you may be dismissed from the clinic at the provider's discretion.      For prescription refill requests, have your pharmacy contact our office and allow 72 hours for refills to be completed.    Today you received the following chemotherapy and/or immunotherapy agents Folfox   To help prevent nausea and vomiting after your treatment, we encourage you to take your nausea medication as directed.  BELOW ARE SYMPTOMS THAT SHOULD BE REPORTED IMMEDIATELY: *FEVER GREATER THAN 100.4 F (38 C) OR HIGHER *CHILLS OR SWEATING *NAUSEA AND VOMITING THAT IS NOT CONTROLLED WITH YOUR NAUSEA MEDICATION *UNUSUAL SHORTNESS OF BREATH *UNUSUAL BRUISING OR BLEEDING *URINARY PROBLEMS (pain or burning when urinating, or frequent urination) *BOWEL PROBLEMS (unusual diarrhea, constipation, pain near the anus) TENDERNESS IN MOUTH AND THROAT WITH OR WITHOUT PRESENCE OF ULCERS (sore throat, sores in mouth, or a toothache) UNUSUAL RASH, SWELLING OR PAIN  UNUSUAL VAGINAL DISCHARGE OR ITCHING   Items with * indicate a potential emergency and should be followed up as soon as possible or go to the  Emergency Department if any problems should occur.  Please show the CHEMOTHERAPY ALERT CARD or IMMUNOTHERAPY ALERT CARD at check-in to the Emergency Department and triage nurse.  Should you have questions after your visit or need to cancel or reschedule your appointment, please contact [facility]ENTER [facility] [phone]  and follow the prompts.  Office hours are 8:00 a.m. to 4:30 p.m. Monday - Friday. Please note that voicemails left after 4:00 p.m. may not be returned until the following business day.  We are closed weekends and major holidays. You have access to a nurse at all times for urgent questions. Please call the main number to the clinic [phone] and follow the prompts.  For any non-urgent questions, you may also contact your provider using MyChart. We now offer e-Visits for anyone 40 and older to request care online for non-urgent symptoms. For details visit [email or web address].   Also download the MyChart app! Go to the app store, search "MyChart", open the app, select [facility], and log in with your MyChart username and password.

## 2022-09-25 NOTE — Progress Notes (Signed)
Patients port flushed without difficulty.  Good blood return noted with no bruising or swelling noted at site.  Patient remains accessed for treatment.  

## 2022-09-25 NOTE — Progress Notes (Signed)
Patient presents today for Folfox infusion. Patient is in satisfactory condition with complaints voiced of dysuria and frequency when urinating. Dr.K aware. Urine sample collected and taken to the lab. Vital signs are stable.  Labs reviewed by Dr. Ellin Saba during the office visit and all labs are within treatment parameters. Pt's potassium is 3.4 today, pt will receive 40 mEq potassium chloride p.o x 1 dose per Dr.K's standing orders. We will proceed with treatment per MD orders.   UA resulted, Dr.K made aware and sent Cipro 500 mg twice daily for 7 days to patient's pharmacy on file. Pt made aware and verbalized understanding.  Treatment given today per MD orders. Tolerated infusion without adverse affects. Vital signs stable. No complaints at this time. Discharged from clinic via wheelchair in stable condition. Alert and oriented x 3. F/U with [facility] as scheduled.

## 2022-09-25 NOTE — Progress Notes (Signed)
[facility] [address], [place] [address]    Clinic Day:  09/25/22    Referring physician: [facility]  Patient Care Team: [place], [facility] as PCP - General Wendall Stade, MD as PCP - Cardiology (Cardiology) Marinus Maw, MD as PCP - Electrophysiology (Cardiology) Therese Sarah, RN as Oncology Nurse Navigator (Medical Oncology) Doreatha Massed, MD as Medical Oncologist (Medical Oncology)   ASSESSMENT & PLAN:   Assessment: 1.  Stage II (T3 N0 M0) low rectal adenocarcinoma, MMR preserved: - 73-month history of intermittent rectal bleeding.  No weight loss. - Colonoscopy (04/26/2022): Large cauliflower mass palpated in the distal rectum on DRE.  Scattered diverticula in the descending colon.  5 mm polyp in the ascending colon.  In the rectum, exophytic semilunar neoplastic appearing process beginning at the anal verge and extending proximally about 6 cm. - Pathology (04/26/2022): Invasive moderately differentiated adenocarcinoma of the rectal mass.  MMR preserved. - CT CAP (05/01/2022): Nodular wall thickening along the rectum.  No intrinsic abnormal lymph nodes.  No liver lesion.  Fatty liver.  Nodule along the left side of the urinary bladder.  Small mass along the course of the distal left ureter.  Worrisome for multifocal TCC.  Small lung nodules measuring up to 4 mm. - MRI pelvis (05/03/2022): 6.4 cm left lateral low rectal tumor abutting the internal anal sphincter, T3c N0.  Distance from tumor to the internal anal sphincter is 0 cm.  Tumor measures 6.4 cm in length and up to 2.2 cm in thickness. - PET scan (05/11/2022): No evidence of metastatic disease. - Met with Dr. Maisie Fus on 05/16/2022: She is in agreement with TNT and has discussed APR with colostomy. - TNT: FOLFOX cycle 1 started on 06/07/2022   2.  Social/family history: -He lives at home and is independent of ADLs and IADLs.  He is accompanied by his son today.  Worked at  [employer] prior to retirement.  Also served in [place] but denied any exposure to agent orange.  Quit smoking 1 year ago.  Smoked 1 to 2 packs/day for the last 61 years.  Started at age 68. - No family history of malignancies.    Plan: 1.  Stage II (T3 cN0 M0) low rectal adenocarcinoma, MMR preserved: - He has tolerated cycle 7 very well. - Reviewed labs today: Normal LFTs and creatinine.  CBC grossly normal. - He complained of dysuria.  We checked a UA which was positive for infection.  Will send Cipro 500 twice daily for 7 days. - Talked about chemoradiation therapy with Xeloda 1500 mg twice daily Monday through Friday throughout the course of radiation. - Discussed side effects of Xeloda in detail including mucositis, hand-foot skin reaction, cytopenias among others. - Proceed with cycle 8 today.  RTC 6 weeks for follow-up.   2.  Left urinary bladder mass and distal left ureteral mass: - He sees Dr. Ronne Binning on 10/02/2022. - Recommend TURBT prior to initiation of radiation upon discussion with Dr. Langston Masker.  3.  A-flutter with RVR: - Heart rate is remaining stable since ablation.  He is on Xarelto and amiodarone.  Continues to be on oxygen 4 L/min.     Orders Placed This Encounter  Procedures   Urine culture    Standing Status:   Future    Number of Occurrences:   1    Standing Expiration Date:   09/25/2023   Urinalysis, Routine w reflex microscopic  [name],acting as a Neurosurgeon for Doreatha Massed, MD.,have documented all relevant documentation on the behalf of Doreatha Massed, MD,as directed by  Doreatha Massed, MD while in the presence of Doreatha Massed, MD.  I, Doreatha Massed MD, have reviewed the above documentation for accuracy and completeness, and I agree with the above.      Doreatha Massed, MD   8/19/20245:26 PM  CHIEF COMPLAINT:   Diagnosis: Rectal adenocarcinoma    Cancer Staging  Rectal adenocarcinoma  [facility]) Staging form: Colon and Rectum, AJCC 8th Edition - Clinical stage from 05/07/2022: Stage IIA (cT3, cN0, cM0) - Unsigned    Prior Therapy: none  Current Therapy:  Concurrent chemoradiation with FOLFOX, to be followed by surgery    HISTORY OF PRESENT ILLNESS:   Oncology History  Rectal adenocarcinoma (HCC)  05/07/2022 Initial Diagnosis   Rectal adenocarcinoma (HCC)   06/07/2022 -  Chemotherapy   Patient is on Treatment Plan : COLORECTAL FOLFOX q14d x 4 months        INTERVAL HISTORY:   Jahmeir is a 77 y.o. male presenting to clinic today for follow up of Rectal adenocarcinoma. He was last seen by me on 09/11/22.  Today, he states that he is doing well overall. His appetite level is at 100%. His energy level is at 70%.  PAST MEDICAL HISTORY:   Past Medical History: Past Medical History:  Diagnosis Date   Back pain    COPD (chronic obstructive pulmonary disease) (HCC)    Diabetes mellitus without complication (HCC)    Hyperlipidemia     Surgical History: Past Surgical History:  Procedure Laterality Date   A-FLUTTER ABLATION N/A 08/23/2022   Procedure: A-FLUTTER ABLATION;  Surgeon: Marinus Maw, MD;  Location: [facility]IVE [facility];  Service: Cardiovascular;  Laterality: N/A;   BIOPSY  04/26/2022   Procedure: BIOPSY;  Surgeon: Corbin Ade, MD;  Location: [facility];  Service: Endoscopy;;   COLONOSCOPY WITH PROPOFOL N/A 04/26/2022   Procedure: COLONOSCOPY WITH PROPOFOL;  Surgeon: Corbin Ade, MD;  Location: [facility];  Service: Endoscopy;  Laterality: N/A;  10:00am; ASA 3   hemorhoidectomy     IR IMAGING GUIDED PORT INSERTION  05/12/2022   POLYPECTOMY  04/26/2022   Procedure: POLYPECTOMY;  Surgeon: Corbin Ade, MD;  Location: [facility];  Service: Endoscopy;;    Social History: Social History   Socioeconomic History   Marital status: Widowed    Spouse name: Not on file   Number of children: 2   Years of education: Not on file   Highest education  level: Not on file  Occupational History   Not on file  Tobacco Use   Smoking status: Former    Current packs/day: 0.00    Types: Cigarettes    Start date: 08/1966    Quit date: 08/2016    Years since quitting: 6.1   Smokeless tobacco: Never  Vaping Use   Vaping status: Never Used  Substance and Sexual Activity   Alcohol use: Never   Drug use: Never   Sexual activity: Not Currently  Other Topics Concern   Not on file  Social History Narrative   Not on file   Social Determinants of Health   Financial Resource Strain: Not on file  Food Insecurity: No Food Insecurity (07/07/2022)   Hunger Vital Sign    Worried About Running Out of Food in the Last Year: Never true    Ran Out of Food in the Last Year: Never  true  Transportation Needs: No Transportation Needs (07/07/2022)   PRAPARE - Administrator, Civil Service (Medical): No    Lack of Transportation (Non-Medical): No  Physical Activity: Not on file  Stress: Not on file  Social Connections: Not on file  Intimate Partner Violence: Not At Risk (07/07/2022)   Humiliation, Afraid, Rape, and Kick questionnaire    Fear of Current or Ex-Partner: No    Emotionally Abused: No    Physically Abused: No    Sexually Abused: No    Family History: No family history on file.  Current Medications:  Current Outpatient Medications:    acidophilus (RISAQUAD) CAPS capsule, Take 1 capsule by mouth daily., Disp: , Rfl:    albuterol (VENTOLIN HFA) 108 (90 Base) MCG/ACT inhaler, Inhale 2 puffs into the lungs every 6 (six) hours as needed for wheezing or shortness of breath., Disp: , Rfl:    allopurinol (ZYLOPRIM) 300 MG tablet, Take 300 mg by mouth daily., Disp: , Rfl:    atorvastatin (LIPITOR) 20 MG tablet, Take 20 mg by mouth daily., Disp: , Rfl:    dextrose 5 % SOLN 1,000 mL with fluorouracil 5 GM/100ML SOLN, Inject into the vein., Disp: , Rfl:    fenofibrate 54 MG tablet, Take 54 mg by mouth daily., Disp: , Rfl:    JARDIANCE  10 MG TABS tablet, Take 25 mg by mouth daily., Disp: , Rfl:    LEUCOVORIN CALCIUM IV, Inject into the vein., Disp: , Rfl:    lidocaine-prilocaine (EMLA) cream, Apply 1 Application topically as needed., Disp: 30 g, Rfl: 0   loperamide (IMODIUM) 2 MG capsule, Take 2 mg by mouth 3 (three) times daily as needed for diarrhea or loose stools., Disp: , Rfl:    MELATONIN PO, Take 1 tablet by mouth at bedtime as needed (sleep)., Disp: , Rfl:    Misc. Devices MISC, Please provide with shower chair, Disp: 1 Units, Rfl: 0   OXALIPLATIN IV, Inject into the vein., Disp: , Rfl:    prochlorperazine (COMPAZINE) 10 MG tablet, Take 1 tablet (10 mg total) by mouth every 6 (six) hours as needed for nausea or vomiting., Disp: 30 tablet, Rfl: 2   capecitabine (XELODA) 500 MG tablet, Take 3 tablets (1,500 mg total) by mouth 2 (two) times daily after a meal. Take Monday-Friday. Take only on days of radiation., Disp: 168 tablet, Rfl: 0   ciprofloxacin (CIPRO) 500 MG tablet, Take 1 tablet (500 mg total) by mouth 2 (two) times daily., Disp: 14 tablet, Rfl: 0 No current facility-administered medications for this visit.  Facility-Administered Medications Ordered in Other Visits:    fluorouracil (ADRUCIL) 5,000 mg in sodium chloride 0.9 % 150 mL chemo infusion, 2,400 mg/m2 (Treatment Plan Recorded), Intravenous, 1 day or 1 dose, Doreatha Massed, MD, Infusion Verify at 09/25/22 1553   Allergies: No Known Allergies  REVIEW OF SYSTEMS:   Review of Systems  Constitutional:  Negative for chills, fatigue and fever.  HENT:   Negative for lump/mass, mouth sores, nosebleeds, sore throat and trouble swallowing.   Eyes:  Negative for eye problems.  Respiratory:  Positive for shortness of breath. Negative for cough.   Cardiovascular:  Negative for chest pain, leg swelling and palpitations.  Gastrointestinal:  Negative for abdominal pain, constipation, diarrhea, nausea and vomiting.  Genitourinary:  Positive for dysuria.  Negative for bladder incontinence, difficulty urinating, frequency, hematuria and nocturia.   Musculoskeletal:  Negative for arthralgias, back pain, flank pain, myalgias and neck pain.  Skin:  Negative for itching and rash.  Neurological:  Negative for dizziness, headaches and numbness.  Hematological:  Does not bruise/bleed easily.  Psychiatric/Behavioral:  Positive for sleep disturbance. Negative for depression and suicidal ideas. The patient is not nervous/anxious.   All other systems reviewed and are negative.    VITALS:   There were no vitals taken for this visit.  Wt Readings from Last 3 Encounters:  09/25/22 173 lb 12.8 oz (78.8 kg)  09/21/22 180 lb 3.2 oz (81.7 kg)  09/11/22 182 lb (82.6 kg)    There is no height or weight on file to calculate BMI.  Performance status (ECOG): 1 - Symptomatic but completely ambulatory  PHYSICAL EXAM:   Physical Exam Vitals and nursing note reviewed. Exam conducted with a chaperone present.  Constitutional:      Appearance: Normal appearance.  Cardiovascular:     Rate and Rhythm: Normal rate and regular rhythm.     Pulses: Normal pulses.     Heart sounds: Normal heart sounds.  Pulmonary:     Effort: Pulmonary effort is normal.     Breath sounds: Normal breath sounds.  Abdominal:     Palpations: Abdomen is soft. There is no hepatomegaly, splenomegaly or mass.     Tenderness: There is no abdominal tenderness.  Musculoskeletal:     Right lower leg: No edema.     Left lower leg: No edema.  Lymphadenopathy:     Cervical: No cervical adenopathy.     Right cervical: No superficial, deep or posterior cervical adenopathy.    Left cervical: No superficial, deep or posterior cervical adenopathy.     Upper Body:     Right upper body: No supraclavicular or axillary adenopathy.     Left upper body: No supraclavicular or axillary adenopathy.  Neurological:     General: No focal deficit present.     Mental Status: He is alert and oriented to  person, place, and time.  Psychiatric:        Mood and Affect: Mood normal.        Behavior: Behavior normal.     LABS:      Latest Ref Rng & Units 09/25/2022    8:00 AM 09/11/2022    8:16 AM 08/28/2022    7:53 AM  CBC  WBC 4.0 - 10.5 K/uL 6.9  6.5  5.7   Hemoglobin 13.0 - 17.0 g/dL 84.1  66.0  63.0   Hematocrit 39.0 - 52.0 % 38.1  38.9  37.3   Platelets 150 - 400 K/uL 217  153  229       Latest Ref Rng & Units 09/25/2022    8:00 AM 09/11/2022    8:16 AM 08/28/2022    7:53 AM  CMP  Glucose 70 - 99 mg/dL 160  109  323   BUN 8 - 23 mg/dL 9  18  13    Creatinine 0.61 - 1.24 mg/dL 5.57  3.22  0.25   Sodium 135 - 145 mmol/L 142  137  140   Potassium 3.5 - 5.1 mmol/L 3.4  3.7  3.8   Chloride 98 - 111 mmol/L 103  105  105   CO2 22 - 32 mmol/L 27  27  29    Calcium 8.9 - 10.3 mg/dL 9.1  8.5  8.5   Total Protein 6.5 - 8.1 g/dL 7.5  7.0  7.1   Total Bilirubin 0.3 - 1.2 mg/dL 0.7  0.5  0.8   Alkaline Phos 38 - 126 U/L 64  58  50   AST 15 - 41 U/L 16  13  13    ALT 0 - 44 U/L 21  15  16       Lab Results  Component Value Date   CEA1 2.6 04/26/2022   /  CEA  Date Value Ref Range Status  04/26/2022 2.6 0.0 - 4.7 ng/mL Final    Comment:    (NOTE)                             Nonsmokers          <3.9                             Smokers             <5.6 Roche [employer] Electrochemiluminescence Immunoassay (ECLIA) Values obtained with different assay methods or kits cannot be used interchangeably.  Results cannot be interpreted as absolute evidence of the presence or absence of malignant disease. Performed At: [facility] [address] [place], [place] [id number] Jolene Schimke MD QI:[id number]    No results found for: "PSA1" No results found for: [phone]" No results found for: "CAN125"  No results found for: "TOTALPROTELP", "ALBUMINELP", "A1GS", "A2GS", "BETS", "BETA2SER", "GAMS", "MSPIKE", "SPEI" No results found for: "TIBC", "FERRITIN", "IRONPCTSAT" No results  found for: "LDH"   STUDIES:   No results found.

## 2022-09-25 NOTE — Telephone Encounter (Signed)
Clinical Pharmacist Practitioner Encounter   Received new prescription for Xeloda (capecitabine) for the neoadjuvant treatment of stage II rectal cancer in conjunction with radiation, planned duration until the end of radiation. He will be completing his radiation therapy with [facility].    CMP from 09/25/22 assessed, no relevant lab abnormalities. Prescription dose and frequency assessed.    Current medication list in Epic reviewed, one DDIs with capecitabine identified: Allopurinol (gout): Category X interaction, Allopurinol may decrease serum concentrations of the active metabolite(s) of Fluorouracil products. The recommendation is to avoid the combination.  Would recommend that patient stop allopurinol and be switched to another medication for gout management   Evaluated chart and no patient barriers to medication adherence identified.    Prescription has been e-scribed to the [facility] for benefits analysis and approval.   Oral Oncology Clinic will continue to follow for insurance authorization, copayment issues, initial counseling and start date.    Remi Haggard, PharmD, BCPS, BCOP, CPP Hematology/Oncology Clinical Pharmacist Practitioner [facility] [phone]  09/25/2022 2:18 PM

## 2022-09-25 NOTE — Patient Instructions (Addendum)
[facility] at [facility] Discharge Instructions   You were seen and examined today by Dr. Ellin Saba.  He reviewed the results of your lab work which are normal/stable.   We will proceed with your treatment today.   We will send a prescription for Xeloda. These are pills that you take twice a day, five days a week. This medication is to be taken ONLY on the days of radiation treatments. If radiation treatment is held for any reason, do not take this medication. Do not take on Saturdays or Sundays.   Return as scheduled.    Thank you for choosing [facility] at [facility] to provide your oncology and hematology care.  To afford each patient quality time with our provider, please arrive at least 15 minutes before your scheduled appointment time.   If you have a lab appointment with the Cancer Center please come in thru the Main Entrance and check in at the main information desk.  You need to re-schedule your appointment should you arrive 10 or more minutes late.  We strive to give you quality time with our providers, and arriving late affects you and other patients whose appointments are after yours.  Also, if you no show three or more times for appointments you may be dismissed from the clinic at the providers discretion.     Again, thank you for choosing [facility].  Our hope is that these requests will decrease the amount of time that you wait before being seen by our physicians.       _____________________________________________________________  Should you have questions after your visit to [facility], please contact our office at [phone] and follow the prompts.  Our office hours are 8:00 a.m. and 4:30 p.m. Monday - Friday.  Please note that voicemails left after 4:00 p.m. may not be returned until the following business day.  We are closed weekends and major holidays.  You do have access to a nurse 24-7, just  call the main number to the clinic [phone] and do not press any options, hold on the line and a nurse will answer the phone.    For prescription refill requests, have your pharmacy contact our office and allow 72 hours.    Due to Covid, you will need to wear a mask upon entering the hospital. If you do not have a mask, a mask will be given to you at the Main Entrance upon arrival. For doctor visits, patients may have 1 support person age 53 or older with them. For treatment visits, patients can not have anyone with them due to social distancing guidelines and our immunocompromised population.

## 2022-09-26 ENCOUNTER — Other Ambulatory Visit (HOSPITAL_COMMUNITY): Payer: Self-pay

## 2022-09-27 ENCOUNTER — Inpatient Hospital Stay: Payer: PPO

## 2022-09-27 VITALS — BP 99/65 | HR 73 | Temp 97.6°F | Resp 18

## 2022-09-27 DIAGNOSIS — Z5111 Encounter for antineoplastic chemotherapy: Secondary | ICD-10-CM | POA: Diagnosis not present

## 2022-09-27 DIAGNOSIS — C20 Malignant neoplasm of rectum: Secondary | ICD-10-CM

## 2022-09-27 LAB — URINE CULTURE: Culture: >=100000 — AB

## 2022-09-27 MED ORDER — HEPARIN SOD (PORK) LOCK FLUSH 100 UNIT/ML IV SOLN
500.0000 [IU] | Freq: Once | INTRAVENOUS | Status: AC | PRN
  Administered 2022-09-27: 500 [IU]

## 2022-09-27 MED ORDER — SODIUM CHLORIDE 0.9% FLUSH
10.0000 mL | INTRAVENOUS | Status: DC | PRN
  Administered 2022-09-27: 10 mL

## 2022-09-27 NOTE — Patient Instructions (Signed)
[facility]  Discharge Instructions: Thank you for choosing [facility] to provide your oncology and hematology care.  If you have a lab appointment with the Cancer Center - please note that after April 8th, 2024, all labs will be drawn in the cancer center.  You do not have to check in or register with the main entrance as you have in the past but will complete your check-in in the cancer center.  Wear comfortable clothing and clothing appropriate for easy access to any Portacath or PICC line.   We strive to give you quality time with your provider. You may need to reschedule your appointment if you arrive late (15 or more minutes).  Arriving late affects you and other patients whose appointments are after yours.  Also, if you miss three or more appointments without notifying the office, you may be dismissed from the clinic at the provider's discretion.      For prescription refill requests, have your pharmacy contact our office and allow 72 hours for refills to be completed.    Today you had your pump d/c'd per protocol.    To help prevent nausea and vomiting after your treatment, we encourage you to take your nausea medication as directed.  BELOW ARE SYMPTOMS THAT SHOULD BE REPORTED IMMEDIATELY: *FEVER GREATER THAN 100.4 F (38 C) OR HIGHER *CHILLS OR SWEATING *NAUSEA AND VOMITING THAT IS NOT CONTROLLED WITH YOUR NAUSEA MEDICATION *UNUSUAL SHORTNESS OF BREATH *UNUSUAL BRUISING OR BLEEDING *URINARY PROBLEMS (pain or burning when urinating, or frequent urination) *BOWEL PROBLEMS (unusual diarrhea, constipation, pain near the anus) TENDERNESS IN MOUTH AND THROAT WITH OR WITHOUT PRESENCE OF ULCERS (sore throat, sores in mouth, or a toothache) UNUSUAL RASH, SWELLING OR PAIN  UNUSUAL VAGINAL DISCHARGE OR ITCHING   Items with * indicate a potential emergency and should be followed up as soon as possible or go to the Emergency Department if any problems should  occur.  Please show the CHEMOTHERAPY ALERT CARD or IMMUNOTHERAPY ALERT CARD at check-in to the Emergency Department and triage nurse.  Should you have questions after your visit or need to cancel or reschedule your appointment, please contact [facility] AT [facility] [phone]  and follow the prompts.  Office hours are 8:00 a.m. to 4:30 p.m. Monday - Friday. Please note that voicemails left after 4:00 p.m. may not be returned until the following business day.  We are closed weekends and major holidays. You have access to a nurse at all times for urgent questions. Please call the main number to the clinic [phone] and follow the prompts.  For any non-urgent questions, you may also contact your provider using MyChart. We now offer e-Visits for anyone 40 and older to request care online for non-urgent symptoms. For details visit [email or web address].   Also download the MyChart app! Go to the app store, search "MyChart", open the app, select [facility], and log in with your MyChart username and password.

## 2022-09-27 NOTE — Progress Notes (Signed)
James Miles presents to have home infusion pump d/c'd and for port-a-cath deaccess with flush.  Portacath located right chest wall accessed with  H 20 needle.  Good blood return present. Portacath flushed with NS and 500U/26ml Heparin, and needle removed intact.  Procedure tolerated well and without incident.

## 2022-09-29 ENCOUNTER — Ambulatory Visit: Payer: PPO | Admitting: Urology

## 2022-10-02 ENCOUNTER — Other Ambulatory Visit (HOSPITAL_COMMUNITY): Payer: Self-pay

## 2022-10-02 ENCOUNTER — Other Ambulatory Visit: Payer: Self-pay

## 2022-10-02 ENCOUNTER — Ambulatory Visit: Payer: PPO | Admitting: Urology

## 2022-10-02 VITALS — BP 91/50 | HR 60

## 2022-10-02 DIAGNOSIS — D494 Neoplasm of unspecified behavior of bladder: Secondary | ICD-10-CM

## 2022-10-02 DIAGNOSIS — N3289 Other specified disorders of bladder: Secondary | ICD-10-CM

## 2022-10-02 LAB — URINALYSIS, ROUTINE W REFLEX MICROSCOPIC
Bilirubin, UA: NEGATIVE
Ketones, UA: NEGATIVE
Leukocytes,UA: NEGATIVE
Nitrite, UA: NEGATIVE
Protein,UA: NEGATIVE
RBC, UA: NEGATIVE
Specific Gravity, UA: 1.02 (ref 1.005–1.030)
Urobilinogen, Ur: 0.2 mg/dL (ref 0.2–1.0)
pH, UA: 5.5 (ref 5.0–7.5)

## 2022-10-02 MED ORDER — CIPROFLOXACIN HCL 500 MG PO TABS
500.0000 mg | ORAL_TABLET | Freq: Once | ORAL | Status: DC
Start: 2022-10-02 — End: 2023-02-23

## 2022-10-02 NOTE — Progress Notes (Unsigned)
   10/02/22  CC: followup bladder cancer   HPI: James Miles is a 77yo here for followup for bladder cancer Blood pressure (!) 91/50, pulse 60. NED. A&Ox3.   No respiratory distress   Abd soft, NT, ND Normal phallus with bilateral descended testicles  Cystoscopy Procedure Note  Patient identification was confirmed, informed consent was obtained, and patient was prepped using Betadine solution.  Lidocaine jelly was administered per urethral meatus.     Pre-Procedure: - Inspection reveals a normal caliber ureteral meatus.  Procedure: The flexible cystoscope was introduced without difficulty - No urethral strictures/lesions are present. - Enlarged prostate  - Normal bladder neck - Bilateral ureteral orifices identified - Bladder mucosa  reveals 1cm right lateral wall tumor with surrounding erythema which is improved since last cystoscopy - No bladder stones - No trabeculation   Post-Procedure: - Patient tolerated the procedure well  Assessment/ Plan: The risks/benefits/alternatives to tranurethral resection of a bladder tumor was explained to the patient and he understands and wishes to proceed with surgery  No follow-ups on file.  Wilkie Aye, MD

## 2022-10-02 NOTE — Telephone Encounter (Signed)
Clinical Pharmacist Practitioner Encounter   Patient does not have a start date for radiation yet, he knows to not start his capecitabine until he starts his radiation.   Patient Education I spoke with patient for overview of new oral chemotherapy medication: Xeloda (capecitabine) for the neoadjuvant treatment of stage II rectal cancer in conjunction with radiation, planned duration until the end of radiation. He will be completing his radiation therapy with [facility].   Counseled patient on administration, dosing, side effects, monitoring, drug-food interactions, safe handling, storage, and disposal. Patient will take 3 tablets (1,500 mg total) by mouth 2 (two) times daily after a meal. Take Monday-Friday. Take only on days of radiation.   Side effects include but not limited to: diarrhea, hand-foot syndrome, mouth sores, edema, decreased wbc, fatigue, N/V Diarrhea: patient knows to use loperamide as needed and call the office if he is having 4 or more loose stools per day Hand-foot syndrome: Patient will keep his hands and feet moisturized and report any skin changes Mouth sores: patient knows to request magic mouthwash if needed.    Patient will hold his allopurinol while on capecitabine (see assessment note for DDIs information). Patient knows to contact his PCP if he has flares while off of the allopurinol.   Reviewed with patient importance of keeping a medication schedule and plan for any missed doses.  After discussion with patient no patient barriers to medication adherence identified.   Mr. Waiters voiced understanding and appreciation. All questions answered. Medication handout provided.  Provided patient with [facility] phone number. Patient knows to call the office with questions or concerns. [facility] will continue to follow.  Remi Haggard, PharmD, BCPS, BCOP, CPP Hematology/Oncology Clinical Pharmacist  Practitioner [place] [phone]  10/02/2022 2:01 PM

## 2022-10-03 ENCOUNTER — Other Ambulatory Visit (HOSPITAL_COMMUNITY): Payer: Self-pay

## 2022-10-04 ENCOUNTER — Encounter: Payer: Self-pay | Admitting: Urology

## 2022-10-04 NOTE — Patient Instructions (Signed)
Transurethral Resection of Bladder Tumor, Care After The following information offers guidance on how to care for yourself after your procedure. Your health care provider may also give you more specific instructions. If you have problems or questions, contact your health care provider. What can I expect after the procedure? After the procedure, it is common to have: A small amount of blood or small blood clots in your urine for up to 2 weeks. Soreness or mild pain from your catheter. After your catheter is removed, you may have mild soreness, especially when urinating. A need to urinate often. Pain in your lower abdomen. Follow these instructions at home: Medicines  Take over-the-counter and prescription medicines only as told by your health care provider. If you were prescribed an antibiotic medicine, take it as told by your health care provider. Do not stop taking the antibiotic even if you start to feel better. Ask your health care provider if the medicine prescribed to you: Requires you to avoid driving or using machinery. Can cause constipation. You may need to take these actions to prevent or treat constipation: Drink enough fluid to keep your urine pale yellow. Take over-the-counter or prescription medicines. Eat foods that are high in fiber, such as beans, whole grains, and fresh fruits and vegetables. Limit foods that are high in fat and processed sugars, such as fried or sweet foods. Activity  If you were given a sedative during the procedure, it can affect you for several hours. Do not drive or operate machinery until your health care provider says that it is safe. Rest as told by your health care provider. Avoid sitting for a long time without moving. Get up to take short walks every 1-2 hours. This is important to improve blood flow and breathing. Ask for help if you feel weak or unsteady. Do not lift anything that is heavier than 10 lb (4.5 kg), or the limit that you are told,  until your health care provider says that it is safe. Avoid intense physical activity for as long as told by your health care provider. Do not have sex until your health care provider approves. Return to your normal activities as told by your health care provider. Ask your health care provider what activities are safe for you. General instructions If you have a catheter, follow instructions from your health care provider about caring for your catheter and your drainage bag. Do not drink alcohol for as long as told by your health care provider. This is especially important if you are taking prescription pain medicines. Do not use any products that contain nicotine or tobacco. These products include cigarettes, chewing tobacco, and vaping devices, such as e-cigarettes. If you need help quitting, ask your health care provider. Wear compression stockings as told by your health care provider. These stockings help to prevent blood clots and reduce swelling in your legs. Keep all follow-up visits. This is important. You will need to be followed closely with regular checks of your bladder and urethra (cystoscopies) to make sure that the cancer does not come back. Contact a health care provider if: You have blood in your urine for more than 2 weeks. You become constipated. Signs of constipation may include: Having fewer than three bowel movements in a week. Difficulty having a bowel movement. Stools that are dry, hard, or larger than normal. You have a urinary catheter in place, and you have: Spasms or pain. Problems with your catheter or your catheter is blocked. Your catheter has been taken  out but you are unable to urinate. You have signs of infection, such as: Fever or chills. Cloudy or bad-smelling urine. Get help right away if: You have severe abdominal pain that gets worse or does not improve with medicine. You have a lot of large blood clots in your urine. You develop swelling or pain in  your leg. You have difficulty breathing. These symptoms may be an emergency. Get help right away. Call 911. Do not wait to see if the symptoms will go away. Do not drive yourself to the hospital. Summary After your procedure, it is common to have a small amount of blood or small blood clots in your urine, soreness or mild pain from your catheter, and pain in your lower abdomen. Take over-the-counter and prescription medicines only as told by your health care provider. Rest as told by your health care provider. Follow your health care provider's instructions about returning to normal activities. Ask what activities are safe for you. If you have a catheter, follow instructions from your health care provider about caring for your catheter and your drainage bag. This information is not intended to replace advice given to you by your health care provider. Make sure you discuss any questions you have with your health care provider. Document Revised: 01/28/2021 Document Reviewed: 01/28/2021 Elsevier Patient Education  2024 [employer].

## 2022-10-17 ENCOUNTER — Other Ambulatory Visit (HOSPITAL_COMMUNITY): Payer: Self-pay

## 2022-10-18 ENCOUNTER — Ambulatory Visit (INDEPENDENT_AMBULATORY_CARE_PROVIDER_SITE_OTHER): Payer: PPO

## 2022-10-18 DIAGNOSIS — R300 Dysuria: Secondary | ICD-10-CM

## 2022-10-18 DIAGNOSIS — N390 Urinary tract infection, site not specified: Secondary | ICD-10-CM

## 2022-10-18 MED ORDER — NITROFURANTOIN MONOHYD MACRO 100 MG PO CAPS
100.0000 mg | ORAL_CAPSULE | Freq: Two times a day (BID) | ORAL | 0 refills | Status: DC
Start: 2022-10-18 — End: 2023-02-13

## 2022-10-18 NOTE — Progress Notes (Signed)
Patient presents today with complaints of  burning with painful urination.  UA and Culture done today.  Dr. Ronne Binning reviewed results and Macrobid BID X 7 days.  Patient aware of MD recommendations and that we will reach out with culture results.      [email or web address], CMA

## 2022-10-19 LAB — URINALYSIS, ROUTINE W REFLEX MICROSCOPIC
Bilirubin, UA: NEGATIVE
Ketones, UA: NEGATIVE
Nitrite, UA: POSITIVE — AB
Specific Gravity, UA: 1.025 (ref 1.005–1.030)
Urobilinogen, Ur: 1 mg/dL (ref 0.2–1.0)
pH, UA: 6 (ref 5.0–7.5)

## 2022-10-19 LAB — MICROSCOPIC EXAMINATION: RBC, Urine: >30 /HPF — AB (ref 0–2)

## 2022-10-21 LAB — URINE CULTURE

## 2022-10-24 ENCOUNTER — Other Ambulatory Visit (HOSPITAL_COMMUNITY): Payer: Self-pay

## 2022-10-25 ENCOUNTER — Ambulatory Visit (HOSPITAL_COMMUNITY)
Admission: RE | Admit: 2022-10-25 | Discharge: 2022-10-25 | Disposition: A | Discharge status: Home / Self Care | Payer: PPO | Source: Ambulatory Visit | Attending: Physician Assistant | Admitting: Physician Assistant

## 2022-10-25 DIAGNOSIS — I428 Other cardiomyopathies: Secondary | ICD-10-CM | POA: Insufficient documentation

## 2022-10-25 LAB — ECHOCARDIOGRAM COMPLETE
Area-P 1/2: 2.17 cm2
S' Lateral: 3.3 cm
Single Plane A4C EF: 61.4 %

## 2022-10-25 NOTE — Progress Notes (Signed)
*  PRELIMINARY RESULTS* Echocardiogram 2D Echocardiogram has been performed.  James Miles 10/25/2022, 12:46 PM

## 2022-10-27 ENCOUNTER — Other Ambulatory Visit: Payer: Self-pay

## 2022-10-27 DIAGNOSIS — C20 Malignant neoplasm of rectum: Secondary | ICD-10-CM

## 2022-11-02 ENCOUNTER — Other Ambulatory Visit (HOSPITAL_COMMUNITY): Payer: Self-pay

## 2022-11-06 ENCOUNTER — Other Ambulatory Visit (HOSPITAL_COMMUNITY): Payer: PPO

## 2022-11-06 ENCOUNTER — Inpatient Hospital Stay: Payer: PPO | Attending: Hematology | Admitting: Hematology

## 2022-11-06 ENCOUNTER — Inpatient Hospital Stay: Payer: PPO

## 2022-11-06 VITALS — BP 109/68 | HR 90 | Temp 98.3°F | Resp 18

## 2022-11-06 DIAGNOSIS — R0602 Shortness of breath: Secondary | ICD-10-CM | POA: Insufficient documentation

## 2022-11-06 DIAGNOSIS — C20 Malignant neoplasm of rectum: Secondary | ICD-10-CM | POA: Insufficient documentation

## 2022-11-06 DIAGNOSIS — N329 Bladder disorder, unspecified: Secondary | ICD-10-CM | POA: Diagnosis not present

## 2022-11-06 DIAGNOSIS — I4892 Unspecified atrial flutter: Secondary | ICD-10-CM | POA: Insufficient documentation

## 2022-11-06 DIAGNOSIS — Z7901 Long term (current) use of anticoagulants: Secondary | ICD-10-CM | POA: Diagnosis not present

## 2022-11-06 DIAGNOSIS — Z87891 Personal history of nicotine dependence: Secondary | ICD-10-CM | POA: Insufficient documentation

## 2022-11-06 DIAGNOSIS — Z452 Encounter for adjustment and management of vascular access device: Secondary | ICD-10-CM | POA: Diagnosis not present

## 2022-11-06 DIAGNOSIS — D4122 Neoplasm of uncertain behavior of left ureter: Secondary | ICD-10-CM | POA: Insufficient documentation

## 2022-11-06 DIAGNOSIS — Z79899 Other long term (current) drug therapy: Secondary | ICD-10-CM | POA: Diagnosis not present

## 2022-11-06 LAB — COMPREHENSIVE METABOLIC PANEL
ALT: 15 U/L (ref 0–44)
AST: 16 U/L (ref 15–41)
Albumin: 3.8 g/dL (ref 3.5–5.0)
Alkaline Phosphatase: 63 U/L (ref 38–126)
Anion gap: 12 (ref 5–15)
BUN: 11 mg/dL (ref 8–23)
CO2: 25 mmol/L (ref 22–32)
Calcium: 8.8 mg/dL — ABNORMAL LOW (ref 8.9–10.3)
Chloride: 103 mmol/L (ref 98–111)
Creatinine, Ser: 0.79 mg/dL (ref 0.61–1.24)
GFR, Estimated: >60 mL/min (ref >60)
Glucose, Bld: 114 mg/dL — ABNORMAL HIGH (ref 70–99)
Potassium: 3.8 mmol/L (ref 3.5–5.1)
Sodium: 140 mmol/L (ref 135–145)
Total Bilirubin: 0.4 mg/dL (ref 0.3–1.2)
Total Protein: 7.1 g/dL (ref 6.5–8.1)

## 2022-11-06 LAB — CBC WITH DIFFERENTIAL/PLATELET
Abs Immature Granulocytes: 0.03 10*3/uL (ref 0.00–0.07)
Basophils Absolute: 0 10*3/uL (ref 0.0–0.1)
Basophils Relative: 0 %
Eosinophils Absolute: 0.2 10*3/uL (ref 0.0–0.5)
Eosinophils Relative: 3 %
HCT: 41.1 % (ref 39.0–52.0)
Hemoglobin: 13 g/dL (ref 13.0–17.0)
Immature Granulocytes: 0 %
Lymphocytes Relative: 37 %
Lymphs Abs: 2.9 10*3/uL (ref 0.7–4.0)
MCH: 31.6 pg (ref 26.0–34.0)
MCHC: 31.6 g/dL (ref 30.0–36.0)
MCV: 99.8 fL (ref 80.0–100.0)
Monocytes Absolute: 0.6 10*3/uL (ref 0.1–1.0)
Monocytes Relative: 7 %
Neutro Abs: 4.1 10*3/uL (ref 1.7–7.7)
Neutrophils Relative %: 53 %
Platelets: 214 10*3/uL (ref 150–400)
RBC: 4.12 MIL/uL — ABNORMAL LOW (ref 4.22–5.81)
RDW: 14.3 % (ref 11.5–15.5)
WBC: 7.8 10*3/uL (ref 4.0–10.5)
nRBC: 0 % (ref 0.0–0.2)

## 2022-11-06 LAB — MAGNESIUM: Magnesium: 2.1 mg/dL (ref 1.7–2.4)

## 2022-11-06 MED ORDER — SODIUM CHLORIDE 0.9% FLUSH
10.0000 mL | Freq: Once | INTRAVENOUS | Status: AC
  Administered 2022-11-06: 10 mL via INTRAVENOUS

## 2022-11-06 MED ORDER — HEPARIN SOD (PORK) LOCK FLUSH 100 UNIT/ML IV SOLN
500.0000 [IU] | Freq: Once | INTRAVENOUS | Status: AC
  Administered 2022-11-06: 500 [IU] via INTRAVENOUS

## 2022-11-06 MED ORDER — FEBUXOSTAT 40 MG PO TABS: 40.0000 mg | ORAL_TABLET | Freq: Every day | ORAL | 1 refills | Status: DC

## 2022-11-06 NOTE — Addendum Note (Signed)
Addended by: Toniann Fail B on: 11/06/2022 10:52 AM   Modules accepted: Orders

## 2022-11-06 NOTE — Progress Notes (Signed)
[facility] [address], [place] [address]    Clinic Day:  11/06/2022  Referring physician: Ponciano Ort [name]  Patient Care Team: [place], [facility] as PCP - General Wendall Stade, MD as PCP - Cardiology (Cardiology) Marinus Maw, MD as PCP - Electrophysiology (Cardiology) Therese Sarah, RN as Oncology Nurse Navigator (Medical Oncology) Doreatha Massed, MD as Medical Oncologist (Medical Oncology)   ASSESSMENT & PLAN:   Assessment: 1.  Stage II (T3 N0 M0) low rectal adenocarcinoma, MMR preserved: - 43-month history of intermittent rectal bleeding.  No weight loss. - Colonoscopy (04/26/2022): Large cauliflower mass palpated in the distal rectum on DRE.  Scattered diverticula in the descending colon.  5 mm polyp in the ascending colon.  In the rectum, exophytic semilunar neoplastic appearing process beginning at the anal verge and extending proximally about 6 cm. - Pathology (04/26/2022): Invasive moderately differentiated adenocarcinoma of the rectal mass.  MMR preserved. - CT CAP (05/01/2022): Nodular wall thickening along the rectum.  No intrinsic abnormal lymph nodes.  No liver lesion.  Fatty liver.  Nodule along the left side of the urinary bladder.  Small mass along the course of the distal left ureter.  Worrisome for multifocal TCC.  Small lung nodules measuring up to 4 mm. - MRI pelvis (05/03/2022): 6.4 cm left lateral low rectal tumor abutting the internal anal sphincter, T3c N0.  Distance from tumor to the internal anal sphincter is 0 cm.  Tumor measures 6.4 cm in length and up to 2.2 cm in thickness. - PET scan (05/11/2022): No evidence of metastatic disease. - Met with Dr. Maisie Fus on 05/16/2022: She is in agreement with TNT and has discussed APR with colostomy. - TNT: FOLFOX cycle 1 started on 06/07/2022   2.  Social/family history: -He lives at home and is independent of ADLs and IADLs.  He is accompanied by his son today.  Worked at  [employer] prior to retirement.  Also served in [place] but denied any exposure to agent orange.  Quit smoking 1 year ago.  Smoked 1 to 2 packs/day for the last 61 years.  Started at age 83. - No family history of malignancies.    Plan: 1.  Stage II (T3 cN0 M0) low rectal adenocarcinoma, MMR preserved: - Completed cycle 8 of chemotherapy on 09/25/2022. - We talked about chemoradiation therapy with Xeloda 1500 mg twice daily Monday through Friday throughout the course of radiation. - We discussed side effects including nausea/vomiting/diarrhea/rare chance of hand-foot skin reaction among others. - He will tentatively start radiation therapy after TURBT on 11/20/2022. - We will discontinue allopurinol due to interaction with Xeloda.  He has been on allopurinol for gout prevention for the past few years. - We will start him on febuxostat 40 mg daily. - RTC 1 week after starting chemoradiation therapy with repeat labs and toxicity assessment.   2.  Left urinary bladder mass and distal left ureteral mass: - In office cystoscopy by Dr. Ronne Binning on 10/02/2022: 1 cm right lateral wall tumor with surrounding erythema, improved since last cystoscopy. - He is scheduled for TURBT on 11/16/2022.   3.  A-flutter with RVR: - Heart rate is stable since ablation. - Continue Xarelto and amiodarone.  Continues to be on oxygen.    No orders of the defined types were placed in this encounter.     I,Katie Daubenspeck,acting as a Neurosurgeon for Doreatha Massed, MD.,have documented all relevant documentation on the behalf of  Doreatha Massed, MD,as directed by  Doreatha Massed, MD while in the presence of Doreatha Massed, MD.   I, Doreatha Massed MD, have reviewed the above documentation for accuracy and completeness, and I agree with the above.   Doreatha Massed, MD   9/30/20244:39 PM  CHIEF COMPLAINT:   Diagnosis: Rectal adenocarcinoma    Cancer Staging  Rectal  adenocarcinoma [facility]) Staging form: Colon and Rectum, AJCC 8th Edition - Clinical stage from 05/07/2022: Stage IIA (cT3, cN0, cM0) - Unsigned    Prior Therapy: FOLFOX, 8 cycles, 06/07/22 - 09/25/22  Current Therapy:  Concurrent chemoradiation with Xeloda, to be followed by surgery    HISTORY OF PRESENT ILLNESS:   Oncology History  Rectal adenocarcinoma (HCC)  05/07/2022 Initial Diagnosis   Rectal adenocarcinoma (HCC)   06/07/2022 -  Chemotherapy   Patient is on Treatment Plan : COLORECTAL FOLFOX q14d x 4 months        INTERVAL HISTORY:   James Miles is a 77 y.o. male presenting to clinic today for follow up of Rectal adenocarcinoma. He was last seen by me on 09/25/22.  Since his last visit, he underwent in-office cystoscopy on 10/02/22 with Dr. Ronne Binning showing: 1 cm right lateral bladder wall tumor with surrounding erythema which is improved since last cystoscopy. He is scheduled for TURBT on 11/16/22.  Today, he states that he is doing well overall. His appetite level is at 100%. His energy level is at 50%.  PAST MEDICAL HISTORY:   Past Medical History: Past Medical History:  Diagnosis Date   Back pain    COPD (chronic obstructive pulmonary disease) (HCC)    Diabetes mellitus without complication (HCC)    Hyperlipidemia     Surgical History: Past Surgical History:  Procedure Laterality Date   A-FLUTTER ABLATION N/A 08/23/2022   Procedure: A-FLUTTER ABLATION;  Surgeon: Marinus Maw, MD;  Location: [facility] INVASIVE CV LAB;  Service: Cardiovascular;  Laterality: N/A;   BIOPSY  04/26/2022   Procedure: BIOPSY;  Surgeon: Corbin Ade, MD;  Location: [facility] ENDO SUITE;  Service: Endoscopy;;   COLONOSCOPY WITH PROPOFOL N/A 04/26/2022   Procedure: COLONOSCOPY WITH PROPOFOL;  Surgeon: Corbin Ade, MD;  Location: [facility] ENDO SUITE;  Service: Endoscopy;  Laterality: N/A;  10:00am; ASA 3   hemorhoidectomy     IR IMAGING GUIDED PORT INSERTION  05/12/2022   POLYPECTOMY  04/26/2022   Procedure:  POLYPECTOMY;  Surgeon: Corbin Ade, MD;  Location: [facility] ENDO SUITE;  Service: Endoscopy;;    Social History: Social History   Socioeconomic History   Marital status: Widowed    Spouse name: Not on file   Number of children: 2   Years of education: Not on file   Highest education level: Not on file  Occupational History   Not on file  Tobacco Use   Smoking status: Former    Current packs/day: 0.00    Types: Cigarettes    Start date: 08/1966    Quit date: 08/2016    Years since quitting: 6.2   Smokeless tobacco: Never  Vaping Use   Vaping status: Never Used  Substance and Sexual Activity   Alcohol use: Never   Drug use: Never   Sexual activity: Not Currently  Other Topics Concern   Not on file  Social History Narrative   Not on file   Social Determinants of Health   Financial Resource Strain: Not on file  Food Insecurity: No Food Insecurity (07/07/2022)   Hunger Vital Sign    Worried  About Running Out of Food in the Last Year: Never true    Ran Out of Food in the Last Year: Never true  Transportation Needs: No Transportation Needs (07/07/2022)   PRAPARE - Administrator, Civil Service (Medical): No    Lack of Transportation (Non-Medical): No  Physical Activity: Not on file  Stress: Not on file  Social Connections: Not on file  Intimate Partner Violence: Not At Risk (07/07/2022)   Humiliation, Afraid, Rape, and Kick questionnaire    Fear of Current or Ex-Partner: No    Emotionally Abused: No    Physically Abused: No    Sexually Abused: No    Family History: No family history on file.  Current Medications:  Current Outpatient Medications:    acidophilus (RISAQUAD) CAPS capsule, Take 1 capsule by mouth daily., Disp: , Rfl:    albuterol (VENTOLIN HFA) 108 (90 Base) MCG/ACT inhaler, Inhale 2 puffs into the lungs every 6 (six) hours as needed for wheezing or shortness of breath., Disp: , Rfl:    atorvastatin (LIPITOR) 20 MG tablet, Take 20 mg by mouth  daily., Disp: , Rfl:    capecitabine (XELODA) 500 MG tablet, Take 3 tablets (1,500 mg total) by mouth 2 (two) times daily after a meal. Take Monday-Friday. Take only on days of radiation. (Patient not taking: Reported on 11/06/2022), Disp: 168 tablet, Rfl: 0   ciprofloxacin (CIPRO) 500 MG tablet, Take 1 tablet (500 mg total) by mouth 2 (two) times daily. (Patient not taking: Reported on 11/06/2022), Disp: 14 tablet, Rfl: 0   dextrose 5 % SOLN 1,000 mL with fluorouracil 5 GM/100ML SOLN, Inject into the vein., Disp: , Rfl:    empagliflozin (JARDIANCE) 25 MG TABS tablet, Take 25 mg by mouth daily., Disp: , Rfl:    febuxostat (ULORIC) 40 MG tablet, Take 1 tablet (40 mg total) by mouth daily., Disp: 90 tablet, Rfl: 1   fenofibrate 54 MG tablet, Take 54 mg by mouth daily., Disp: , Rfl:    LEUCOVORIN CALCIUM IV, Inject into the vein., Disp: , Rfl:    loperamide (IMODIUM) 2 MG capsule, Take 2 mg by mouth 3 (three) times daily as needed for diarrhea or loose stools., Disp: , Rfl:    MELATONIN PO, Take 1 tablet by mouth at bedtime as needed (sleep)., Disp: , Rfl:    Misc. Devices MISC, Please provide with shower chair, Disp: 1 Units, Rfl: 0   nitrofurantoin, macrocrystal-monohydrate, (MACROBID) 100 MG capsule, Take 1 capsule (100 mg total) by mouth every 12 (twelve) hours. (Patient not taking: Reported on 11/06/2022), Disp: 14 capsule, Rfl: 0   OXALIPLATIN IV, Inject into the vein., Disp: , Rfl:    lidocaine-prilocaine (EMLA) cream, Apply 1 Application topically as needed. (Patient not taking: Reported on 11/06/2022), Disp: 30 g, Rfl: 0   prochlorperazine (COMPAZINE) 10 MG tablet, Take 1 tablet (10 mg total) by mouth every 6 (six) hours as needed for nausea or vomiting., Disp: 30 tablet, Rfl: 2  Current Facility-Administered Medications:    ciprofloxacin (CIPRO) tablet 500 mg, 500 mg, Oral, Once, McKenzie, Mardene Celeste, MD   Allergies: No Known Allergies  REVIEW OF SYSTEMS:   Review of Systems   Constitutional:  Negative for chills, fatigue and fever.  HENT:   Negative for lump/mass, mouth sores, nosebleeds, sore throat and trouble swallowing.   Eyes:  Negative for eye problems.  Respiratory:  Positive for shortness of breath. Negative for cough.   Cardiovascular:  Negative for chest pain, leg swelling  and palpitations.  Gastrointestinal:  Negative for abdominal pain, constipation, diarrhea, nausea and vomiting.  Genitourinary:  Negative for bladder incontinence, difficulty urinating, dysuria, frequency, hematuria and nocturia.   Musculoskeletal:  Negative for arthralgias, back pain, flank pain, myalgias and neck pain.  Skin:  Negative for itching and rash.  Neurological:  Negative for dizziness, headaches and numbness.  Hematological:  Does not bruise/bleed easily.  Psychiatric/Behavioral:  Negative for depression, sleep disturbance and suicidal ideas. The patient is not nervous/anxious.   All other systems reviewed and are negative.    VITALS:   Blood pressure 109/68, pulse 90, temperature 98.3 F (36.8 C), temperature source Oral, resp. rate 18, SpO2 94%.  Wt Readings from Last 3 Encounters:  11/06/22 178 lb (80.7 kg)  09/25/22 173 lb 12.8 oz (78.8 kg)  09/21/22 180 lb 3.2 oz (81.7 kg)    There is no height or weight on file to calculate BMI.  Performance status (ECOG): 1 - Symptomatic but completely ambulatory  PHYSICAL EXAM:   Physical Exam Vitals and nursing note reviewed. Exam conducted with a chaperone present.  Constitutional:      Appearance: Normal appearance.  Cardiovascular:     Rate and Rhythm: Normal rate and regular rhythm.     Pulses: Normal pulses.     Heart sounds: Normal heart sounds.  Pulmonary:     Effort: Pulmonary effort is normal.     Breath sounds: Normal breath sounds.  Abdominal:     Palpations: Abdomen is soft. There is no hepatomegaly, splenomegaly or mass.     Tenderness: There is no abdominal tenderness.  Musculoskeletal:      Right lower leg: No edema.     Left lower leg: No edema.  Lymphadenopathy:     Cervical: No cervical adenopathy.     Right cervical: No superficial, deep or posterior cervical adenopathy.    Left cervical: No superficial, deep or posterior cervical adenopathy.     Upper Body:     Right upper body: No supraclavicular or axillary adenopathy.     Left upper body: No supraclavicular or axillary adenopathy.  Neurological:     General: No focal deficit present.     Mental Status: He is alert and oriented to person, place, and time.  Psychiatric:        Mood and Affect: Mood normal.        Behavior: Behavior normal.     LABS:      Latest Ref Rng & Units 11/06/2022   10:44 AM 09/25/2022    8:00 AM 09/11/2022    8:16 AM  CBC  WBC 4.0 - 10.5 K/uL 7.8  6.9  6.5   Hemoglobin 13.0 - 17.0 g/dL 95.2  84.1  32.4   Hematocrit 39.0 - 52.0 % 41.1  38.1  38.9   Platelets 150 - 400 K/uL 214  217  153       Latest Ref Rng & Units 11/06/2022   10:44 AM 09/25/2022    8:00 AM 09/11/2022    8:16 AM  CMP  Glucose 70 - 99 mg/dL 401  027  253   BUN 8 - 23 mg/dL 11  9  18    Creatinine 0.61 - 1.24 mg/dL 6.64  4.03  4.74   Sodium 135 - 145 mmol/L 140  142  137   Potassium 3.5 - 5.1 mmol/L 3.8  3.4  3.7   Chloride 98 - 111 mmol/L 103  103  105   CO2 22 - 32 mmol/L  25  27  27    Calcium 8.9 - 10.3 mg/dL 8.8  9.1  8.5   Total Protein 6.5 - 8.1 g/dL 7.1  7.5  7.0   Total Bilirubin 0.3 - 1.2 mg/dL 0.4  0.7  0.5   Alkaline Phos 38 - 126 U/L 63  64  58   AST 15 - 41 U/L 16  16  13    ALT 0 - 44 U/L 15  21  15       Lab Results  Component Value Date   CEA1 2.6 04/26/2022   /  CEA  Date Value Ref Range Status  04/26/2022 2.6 0.0 - 4.7 ng/mL Final    Comment:    (NOTE)                             Nonsmokers          <3.9                             Smokers             <5.6 [employer] Electrochemiluminescence Immunoassay (ECLIA) Values obtained with different assay methods or kits cannot be used  interchangeably.  Results cannot be interpreted as absolute evidence of the presence or absence of malignant disease. Performed At: [facility] [address] [place], [place] [id number] Jolene Schimke MD VH:[id number]    No results found for: "PSA1" No results found for: "[id number]" No results found for: "CAN125"  No results found for: "TOTALPROTELP", "ALBUMINELP", "A1GS", "A2GS", "BETS", "BETA2SER", "GAMS", "MSPIKE", "SPEI" No results found for: "TIBC", "FERRITIN", "IRONPCTSAT" No results found for: "LDH"   STUDIES:   ECHOCARDIOGRAM COMPLETE  Result Date: 10/25/2022    ECHOCARDIOGRAM REPORT   Patient Name:   James Miles Date of Exam: 10/25/2022 Medical Rec #:  [id number]       Height:       71.0 in Accession #:    [id number]      Weight:       173.8 lb Date of Birth:  [birth year]       BSA:          1.986 m Patient Age:    77 years        BP:           123/71 mmHg Patient Gender: M               HR:           59 bpm. [facility] Procedure: 2D Echo, Cardiac Doppler and Color Doppler Indications:    Nonischemic Cardiomyopathy I42.8  History:        Patient has prior history of Echocardiogram examinations, most                 recent 07/08/2022. Cardiomyopathy, Arrythmias:Atrial Fibrillation,                 Atrial Flutter and Tachycardia; Risk Factors:Diabetes and                 Dyslipidemia. Rectal adenocarcinoma (HCC).  Sonographer:    Celesta Gentile RCS Referring Phys: [id number] RENEE LYNN URSUY IMPRESSIONS  1. Left ventricular ejection fraction, by estimation, is 60 to 65%. The left ventricle has normal function. The left ventricle has no regional wall motion abnormalities. Left ventricular diastolic parameters are consistent with Grade I diastolic dysfunction (  impaired relaxation).  2. Right ventricular systolic function is normal. The right ventricular size is normal. Tricuspid regurgitation signal is inadequate for assessing PA pressure.  3. The mitral valve is normal in  structure. Trivial mitral valve regurgitation. No evidence of mitral stenosis.  4. The aortic valve has an indeterminant number of cusps. Aortic valve regurgitation is not visualized. No aortic stenosis is present.  5. Aortic dilatation noted. There is borderline dilatation of the aortic root, measuring 37 mm.  6. The inferior vena cava is dilated in size with >50% respiratory variability, suggesting right atrial pressure of 8 mmHg. Comparison(s): Changes from prior study are noted. LVEF improved from 40-45% to 60-65% now. FINDINGS  Left Ventricle: Left ventricular ejection fraction, by estimation, is 60 to 65%. The left ventricle has normal function. The left ventricle has no regional wall motion abnormalities. The left ventricular internal cavity size was normal in size. There is  no left ventricular hypertrophy. Left ventricular diastolic parameters are consistent with Grade I diastolic dysfunction (impaired relaxation). Right Ventricle: The right ventricular size is normal. No increase in right ventricular wall thickness. Right ventricular systolic function is normal. Tricuspid regurgitation signal is inadequate for assessing PA pressure. Left Atrium: Left atrial size was normal in size. Right Atrium: Right atrial size was normal in size. Pericardium: There is no evidence of pericardial effusion. Mitral Valve: The mitral valve is normal in structure. Trivial mitral valve regurgitation. No evidence of mitral valve stenosis. Tricuspid Valve: The tricuspid valve is normal in structure. Tricuspid valve regurgitation is trivial. No evidence of tricuspid stenosis. Aortic Valve: The aortic valve has an indeterminant number of cusps. Aortic valve regurgitation is not visualized. No aortic stenosis is present. Pulmonic Valve: The pulmonic valve was normal in structure. Pulmonic valve regurgitation is not visualized. No evidence of pulmonic stenosis. Aorta: Aortic dilatation noted. There is borderline dilatation of the  aortic root, measuring 37 mm. Venous: The inferior vena cava is dilated in size with greater than 50% respiratory variability, suggesting right atrial pressure of 8 mmHg. IAS/Shunts: No atrial level shunt detected by color flow Doppler.  LEFT VENTRICLE PLAX 2D LVIDd:         5.40 cm      Diastology LVIDs:         3.30 cm      LV e' medial:    7.07 cm/s LV PW:         1.00 cm      LV E/e' medial:  10.0 LV IVS:        0.90 cm      LV e' lateral:   5.98 cm/s LVOT diam:     2.10 cm      LV E/e' lateral: 11.9 LV SV:         81 LV SV Index:   41 LVOT Area:     3.46 cm  LV Volumes (MOD) LV vol d, MOD A4C: 125.0 ml LV vol s, MOD A4C: 48.3 ml LV SV MOD A4C:     125.0 ml RIGHT VENTRICLE RV S prime:     14.00 cm/s TAPSE (M-mode): 2.3 cm LEFT ATRIUM             Index        RIGHT ATRIUM           Index LA diam:        3.80 cm 1.91 cm/m   RA Area:     17.40 cm LA Vol (A2C):   71.1  ml 35.79 ml/m  RA Volume:   45.60 ml  22.96 ml/m LA Vol (A4C):   57.7 ml 29.05 ml/m LA Biplane Vol: 66.4 ml 33.43 ml/m  AORTIC VALVE LVOT Vmax:   120.00 cm/s LVOT Vmean:  71.500 cm/s LVOT VTI:    0.233 m  AORTA Ao Root diam: 3.70 cm MITRAL VALVE MV Area (PHT): 2.17 cm    SHUNTS MV Decel Time: 349 msec    Systemic VTI:  0.23 m MV E velocity: 71.00 cm/s  Systemic Diam: 2.10 cm MV A velocity: 78.30 cm/s MV E/A ratio:  0.91 Vishnu Priya Mallipeddi Electronically signed by Winfield Rast Mallipeddi Signature Date/Time: 10/25/2022/1:14:05 PM    Final

## 2022-11-06 NOTE — Patient Instructions (Signed)
[facility] at [facility] Discharge Instructions   You were seen and examined today by Dr. Ellin Saba.  He reviewed the results of your lab work which are normal/stable.   We will try to plan for you to start radiation therapy and the Xeloda pills on 11/20/22. You only take the pills on the days of radiation. Do not take on the weekends when you do not have radiation.   Return as scheduled.    Thank you for choosing [facility] at [facility] to provide your oncology and hematology care.  To afford each patient quality time with our provider, please arrive at least 15 minutes before your scheduled appointment time.   If you have a lab appointment with the Cancer Center please come in thru the Main Entrance and check in at the main information desk.  You need to re-schedule your appointment should you arrive 10 or more minutes late.  We strive to give you quality time with our providers, and arriving late affects you and other patients whose appointments are after yours.  Also, if you no show three or more times for appointments you may be dismissed from the clinic at the providers discretion.     Again, thank you for choosing [facility].  Our hope is that these requests will decrease the amount of time that you wait before being seen by our physicians.       _____________________________________________________________  Should you have questions after your visit to [facility], please contact our office at [phone] and follow the prompts.  Our office hours are 8:00 a.m. and 4:30 p.m. Monday - Friday.  Please note that voicemails left after 4:00 p.m. may not be returned until the following business day.  We are closed weekends and major holidays.  You do have access to a nurse 24-7, just call the main number to the clinic [phone] and do not press any options, hold on the line and a nurse will answer the phone.     For prescription refill requests, have your pharmacy contact our office and allow 72 hours.    Due to Covid, you will need to wear a mask upon entering the hospital. If you do not have a mask, a mask will be given to you at the Main Entrance upon arrival. For doctor visits, patients may have 1 support person age 28 or older with them. For treatment visits, patients can not have anyone with them due to social distancing guidelines and our immunocompromised population.

## 2022-11-13 ENCOUNTER — Encounter (HOSPITAL_COMMUNITY): Payer: Self-pay

## 2022-11-13 ENCOUNTER — Encounter (HOSPITAL_COMMUNITY)
Admission: RE | Admit: 2022-11-13 | Discharge: 2022-11-13 | Disposition: A | Discharge status: Home / Self Care | Payer: PPO | Source: Ambulatory Visit | Attending: Urology | Admitting: Urology

## 2022-11-13 HISTORY — DX: Dyspnea, unspecified: R06.00

## 2022-11-13 HISTORY — DX: Dependence on supplemental oxygen: Z99.81

## 2022-11-13 HISTORY — DX: Cardiac arrhythmia, unspecified: I49.9

## 2022-11-14 ENCOUNTER — Other Ambulatory Visit (HOSPITAL_COMMUNITY)

## 2022-11-15 ENCOUNTER — Encounter: Payer: PPO | Admitting: Urology

## 2022-11-15 MED ORDER — GEMCITABINE CHEMO FOR BLADDER INSTILLATION 2000 MG
2000.0000 mg | Freq: Once | INTRAVENOUS | Status: DC
  Filled 2022-11-15: qty 52.6

## 2022-11-16 ENCOUNTER — Ambulatory Visit (HOSPITAL_COMMUNITY)
Admission: RE | Admit: 2022-11-16 | Discharge: 2022-11-16 | Disposition: A | Discharge status: Home / Self Care | Payer: PPO | Attending: Urology | Admitting: Urology

## 2022-11-16 ENCOUNTER — Ambulatory Visit (HOSPITAL_COMMUNITY): Payer: PPO | Admitting: Certified Registered"

## 2022-11-16 ENCOUNTER — Ambulatory Visit (HOSPITAL_BASED_OUTPATIENT_CLINIC_OR_DEPARTMENT_OTHER): Payer: PPO | Admitting: Certified Registered"

## 2022-11-16 ENCOUNTER — Encounter (HOSPITAL_COMMUNITY)
Admission: RE | Disposition: A | Discharge status: Home / Self Care | Payer: Self-pay | Source: Home / Self Care | Attending: Urology

## 2022-11-16 DIAGNOSIS — N3080 Other cystitis without hematuria: Secondary | ICD-10-CM | POA: Insufficient documentation

## 2022-11-16 DIAGNOSIS — J449 Chronic obstructive pulmonary disease, unspecified: Secondary | ICD-10-CM | POA: Insufficient documentation

## 2022-11-16 DIAGNOSIS — Z7984 Long term (current) use of oral hypoglycemic drugs: Secondary | ICD-10-CM | POA: Insufficient documentation

## 2022-11-16 DIAGNOSIS — E119 Type 2 diabetes mellitus without complications: Secondary | ICD-10-CM | POA: Insufficient documentation

## 2022-11-16 DIAGNOSIS — Z87891 Personal history of nicotine dependence: Secondary | ICD-10-CM | POA: Insufficient documentation

## 2022-11-16 DIAGNOSIS — C672 Malignant neoplasm of lateral wall of bladder: Secondary | ICD-10-CM

## 2022-11-16 DIAGNOSIS — D494 Neoplasm of unspecified behavior of bladder: Secondary | ICD-10-CM | POA: Diagnosis present

## 2022-11-16 DIAGNOSIS — N3030 Trigonitis without hematuria: Secondary | ICD-10-CM | POA: Diagnosis not present

## 2022-11-16 HISTORY — PX: TRANSURETHRAL RESECTION OF BLADDER TUMOR: SHX2575

## 2022-11-16 HISTORY — PX: CYSTOSCOPY: SHX5120

## 2022-11-16 HISTORY — PX: BLADDER INSTILLATION: SHX6893

## 2022-11-16 LAB — GLUCOSE, CAPILLARY: Glucose-Capillary: 128 mg/dL — ABNORMAL HIGH (ref 70–99)

## 2022-11-16 SURGERY — CYSTOSCOPY
Anesthesia: General | Site: Bladder

## 2022-11-16 MED ORDER — CEFAZOLIN SODIUM-DEXTROSE 2-4 GM/100ML-% IV SOLN
INTRAVENOUS | Status: AC
  Filled 2022-11-16: qty 100

## 2022-11-16 MED ORDER — LIDOCAINE HCL (CARDIAC) PF 100 MG/5ML IV SOSY
PREFILLED_SYRINGE | INTRAVENOUS | Status: DC | PRN
  Administered 2022-11-16: 80 mg via INTRATRACHEAL

## 2022-11-16 MED ORDER — LIDOCAINE HCL (PF) 2 % IJ SOLN
INTRAMUSCULAR | Status: AC
  Filled 2022-11-16: qty 5

## 2022-11-16 MED ORDER — PHENYLEPHRINE 80 MCG/ML (10ML) SYRINGE FOR IV PUSH (FOR BLOOD PRESSURE SUPPORT)
PREFILLED_SYRINGE | INTRAVENOUS | Status: DC | PRN
Start: 2022-11-16 — End: 2022-11-16
  Administered 2022-11-16 (×2): 160 ug via INTRAVENOUS

## 2022-11-16 MED ORDER — CEFAZOLIN SODIUM-DEXTROSE 2-4 GM/100ML-% IV SOLN
2.0000 g | INTRAVENOUS | Status: AC
  Administered 2022-11-16: 2 g via INTRAVENOUS

## 2022-11-16 MED ORDER — OXYCODONE HCL 5 MG/5ML PO SOLN: 5.0000 mg | Freq: Once | ORAL | Status: AC | PRN

## 2022-11-16 MED ORDER — OXYCODONE HCL 5 MG PO TABS
5.0000 mg | ORAL_TABLET | Freq: Once | ORAL | Status: AC | PRN
  Administered 2022-11-16: 5 mg via ORAL
  Filled 2022-11-16: qty 1

## 2022-11-16 MED ORDER — ONDANSETRON HCL 4 MG/2ML IJ SOLN
INTRAMUSCULAR | Status: DC | PRN
  Administered 2022-11-16: 4 mg via INTRAVENOUS

## 2022-11-16 MED ORDER — STERILE WATER FOR IRRIGATION IR SOLN
Status: DC | PRN
  Administered 2022-11-16: 500 mL

## 2022-11-16 MED ORDER — SODIUM CHLORIDE 0.9 % IR SOLN
Status: DC | PRN
  Administered 2022-11-16: 3000 mL

## 2022-11-16 MED ORDER — PHENYLEPHRINE 80 MCG/ML (10ML) SYRINGE FOR IV PUSH (FOR BLOOD PRESSURE SUPPORT)
PREFILLED_SYRINGE | INTRAVENOUS | Status: AC
  Filled 2022-11-16: qty 10

## 2022-11-16 MED ORDER — PROPOFOL 10 MG/ML IV BOLUS
INTRAVENOUS | Status: AC
  Filled 2022-11-16: qty 20

## 2022-11-16 MED ORDER — LACTATED RINGERS IV SOLN
INTRAVENOUS | Status: DC | PRN
Start: 2022-11-16 — End: 2022-11-16

## 2022-11-16 MED ORDER — FENTANYL CITRATE PF 50 MCG/ML IJ SOSY
25.0000 ug | PREFILLED_SYRINGE | INTRAMUSCULAR | Status: DC | PRN
  Administered 2022-11-16: 50 ug via INTRAVENOUS
  Filled 2022-11-16: qty 1

## 2022-11-16 MED ORDER — ROCURONIUM BROMIDE 10 MG/ML (PF) SYRINGE
PREFILLED_SYRINGE | INTRAVENOUS | Status: AC
  Filled 2022-11-16: qty 10

## 2022-11-16 MED ORDER — DEXAMETHASONE SODIUM PHOSPHATE 10 MG/ML IJ SOLN
INTRAMUSCULAR | Status: AC
  Filled 2022-11-16: qty 1

## 2022-11-16 MED ORDER — FENTANYL CITRATE (PF) 100 MCG/2ML IJ SOLN
INTRAMUSCULAR | Status: DC | PRN
  Administered 2022-11-16 (×2): 50 ug via INTRAVENOUS

## 2022-11-16 MED ORDER — PROPOFOL 10 MG/ML IV BOLUS
INTRAVENOUS | Status: DC | PRN
  Administered 2022-11-16: 160 mg via INTRAVENOUS

## 2022-11-16 MED ORDER — ONDANSETRON HCL 4 MG/2ML IJ SOLN: 4.0000 mg | Freq: Once | INTRAMUSCULAR | Status: DC | PRN

## 2022-11-16 MED ORDER — SUGAMMADEX SODIUM 200 MG/2ML IV SOLN
INTRAVENOUS | Status: DC | PRN
  Administered 2022-11-16: 300 mg via INTRAVENOUS

## 2022-11-16 MED ORDER — ROCURONIUM BROMIDE 10 MG/ML (PF) SYRINGE
PREFILLED_SYRINGE | INTRAVENOUS | Status: DC | PRN
  Administered 2022-11-16: 50 mg via INTRAVENOUS

## 2022-11-16 MED ORDER — HYDROCODONE-ACETAMINOPHEN 5-325 MG PO TABS: 1.0000 | ORAL_TABLET | Freq: Four times a day (QID) | ORAL | 0 refills | Status: DC | PRN

## 2022-11-16 MED ORDER — ONDANSETRON HCL 4 MG/2ML IJ SOLN
INTRAMUSCULAR | Status: AC
  Filled 2022-11-16: qty 2

## 2022-11-16 MED ORDER — DEXAMETHASONE SODIUM PHOSPHATE 10 MG/ML IJ SOLN
INTRAMUSCULAR | Status: DC | PRN
  Administered 2022-11-16: 6 mg via INTRAVENOUS

## 2022-11-16 MED ORDER — FENTANYL CITRATE (PF) 100 MCG/2ML IJ SOLN: INTRAMUSCULAR | Status: DC | PRN

## 2022-11-16 MED ORDER — GEMCITABINE CHEMO FOR BLADDER INSTILLATION 2000 MG
INTRAVENOUS | Status: DC | PRN
  Administered 2022-11-16: 2000 mg via INTRAVESICAL

## 2022-11-16 MED ORDER — FENTANYL CITRATE (PF) 100 MCG/2ML IJ SOLN
INTRAMUSCULAR | Status: AC
  Filled 2022-11-16: qty 2

## 2022-11-16 SURGICAL SUPPLY — 30 items
BAG DRAIN URO TABLE W/ADPT NS (BAG) ×1 IMPLANT
BAG DRN 8 ADPR NS SKTRN CSTL (BAG) ×1
BAG DRN RND TRDRP ANRFLXCHMBR (UROLOGICAL SUPPLIES) ×1
BAG HAMPER (MISCELLANEOUS) ×1 IMPLANT
BAG URINE DRAIN 2000ML AR STRL (UROLOGICAL SUPPLIES) ×1 IMPLANT
CATH FOLEY 2WAY SLVR 30CC 20FR (CATHETERS) IMPLANT
CATH FOLEY 2WAY SLVR 30CC 22FR (CATHETERS) IMPLANT
CATH FOLEY 3WAY 30CC 22F (CATHETERS) IMPLANT
CATH FOLEY LATEX FREE 22FR (CATHETERS)
CATH FOLEY LF 22FR (CATHETERS) IMPLANT
CLOTH BEACON ORANGE TIMEOUT ST (SAFETY) ×1 IMPLANT
ELECT LOOP 22F BIPOLAR SML (ELECTROSURGICAL) ×1
ELECTRODE LOOP 22F BIPOLAR SML (ELECTROSURGICAL) ×1 IMPLANT
GLOVE BIO SURGEON STRL SZ8 (GLOVE) ×1 IMPLANT
GLOVE BIOGEL PI IND STRL 7.0 (GLOVE) ×2 IMPLANT
GOWN STRL REUS W/TWL LRG LVL3 (GOWN DISPOSABLE) ×2 IMPLANT
GOWN STRL REUS W/TWL XL LVL3 (GOWN DISPOSABLE) ×1 IMPLANT
IV NS IRRIG 3000ML ARTHROMATIC (IV SOLUTION) ×2 IMPLANT
KIT CHEMO SPILL (MISCELLANEOUS) ×1 IMPLANT
KIT TURNOVER CYSTO (KITS) ×1 IMPLANT
MANIFOLD NEPTUNE II (INSTRUMENTS) ×1 IMPLANT
PACK CYSTO (CUSTOM PROCEDURE TRAY) ×1 IMPLANT
PAD ARMBOARD 7.5X6 YLW CONV (MISCELLANEOUS) ×1 IMPLANT
PLUG CATH AND CAP STER (CATHETERS) IMPLANT
POSITIONER HEAD 8X9X4 ADT (SOFTGOODS) ×1 IMPLANT
SYR 30ML LL (SYRINGE) ×1 IMPLANT
SYR TOOMEY IRRIG 70ML (MISCELLANEOUS) ×1
SYRINGE TOOMEY IRRIG 70ML (MISCELLANEOUS) ×1 IMPLANT
TOWEL OR 17X26 4PK STRL BLUE (TOWEL DISPOSABLE) ×1 IMPLANT
WATER STERILE IRR 500ML POUR (IV SOLUTION) ×1 IMPLANT

## 2022-11-16 NOTE — Anesthesia Preprocedure Evaluation (Addendum)
Anesthesia Evaluation  Patient identified by MRN, date of birth, ID band Patient awake    Reviewed: Allergy & Precautions, H&P , NPO status , Patient's Chart, lab work & pertinent test results, reviewed documented beta blocker date and time   Airway Mallampati: II  TM Distance: >3 FB Neck ROM: full    Dental no notable dental hx.    Pulmonary shortness of breath, COPD, former smoker   Pulmonary exam normal breath sounds clear to auscultation       Cardiovascular Exercise Tolerance: Good negative cardio ROS + dysrhythmias  Rhythm:regular Rate:Normal     Neuro/Psych negative neurological ROS  negative psych ROS   GI/Hepatic negative GI ROS, Neg liver ROS,,,  Endo/Other  diabetes    Renal/GU negative Renal ROS  negative genitourinary   Musculoskeletal   Abdominal   Peds  Hematology negative hematology ROS (+)   Anesthesia Other Findings   Reproductive/Obstetrics negative OB ROS                              Anesthesia Physical Anesthesia Plan  ASA: 3  Anesthesia Plan: General and General LMA   Post-op Pain Management:    Induction:   PONV Risk Score and Plan: Ondansetron  Airway Management Planned:   Additional Equipment:   Intra-op Plan:   Post-operative Plan:   Informed Consent: I have reviewed the patients History and Physical, chart, labs and discussed the procedure including the risks, benefits and alternatives for the proposed anesthesia with the patient or authorized representative who has indicated his/her understanding and acceptance.     Dental Advisory Given  Plan Discussed with: CRNA  Anesthesia Plan Comments:         Anesthesia Quick Evaluation

## 2022-11-16 NOTE — Anesthesia Postprocedure Evaluation (Signed)
Anesthesia Post Note  Patient: [name]  Procedure(s) Performed: CYSTOSCOPY (Bladder) TRANSURETHRAL RESECTION OF BLADDER TUMOR (TURBT) (Bladder) BLADDER INSTILLATION- Gemcitibine (Bladder)  Patient location during evaluation: Phase II Anesthesia Type: General Level of consciousness: awake Pain management: pain level controlled Vital Signs Assessment: post-procedure vital signs reviewed and stable Respiratory status: spontaneous breathing and respiratory function stable Cardiovascular status: blood pressure returned to baseline and stable Postop Assessment: no headache and no apparent nausea or vomiting Anesthetic complications: no Comments: Late entry   No notable events documented.   Last Vitals:  Vitals:   11/16/22 1145 11/16/22 1152  BP: (!) 147/72   Pulse: (!) 55 (!) 54  Resp: 14 11  Temp:    SpO2: 98% 98%    Last Pain:  Vitals:   11/16/22 1152  PainSc: 3                  Windell Norfolk

## 2022-11-16 NOTE — Anesthesia Procedure Notes (Signed)
Procedure Name: Intubation Date/Time: 11/16/2022 10:07 AM  Performed by: Oletha Cruel, CRNAPre-anesthesia Checklist: Patient identified, Emergency Drugs available, Suction available and Patient being monitored Patient Re-evaluated:Patient Re-evaluated prior to induction Oxygen Delivery Method: Circle system utilized Preoxygenation: Pre-oxygenation with 100% oxygen Induction Type: IV induction Ventilation: Mask ventilation without difficulty Laryngoscope Size: Mac and 4 Grade View: Grade I Tube type: Oral Tube size: 7.0 mm Number of attempts: 1 Airway Equipment and Method: Stylet Placement Confirmation: ETT inserted through vocal cords under direct vision, positive ETCO2, CO2 detector and breath sounds checked- equal and bilateral Secured at: 22 (OETT positioned 22cm at lower lip.) cm Tube secured with: Tape Dental Injury: Teeth and Oropharynx as per pre-operative assessment  Comments: Atraumatic intubation. Lips and teeth remain in preoperative condition.

## 2022-11-16 NOTE — H&P (Signed)
HPI: [name] is a 77yo here for bladder tumor resection. He developed gross hematuria and underwent CT on 05/09/2022 which showed a possible bladder mass. He has a 55pk year smoking hx. No recent gross hematuria. 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     PMH:     Past Medical History:  Diagnosis Date   Back pain     COPD (chronic obstructive pulmonary disease) (HCC)     Diabetes mellitus without complication (HCC)     Hyperlipidemia            Surgical History:      Past Surgical History:  Procedure Laterality Date   BIOPSY   04/26/2022    Procedure: BIOPSY;  Surgeon: Corbin Ade, MD;  Location: [facility];  Service: Endoscopy;;   COLONOSCOPY WITH PROPOFOL N/A 04/26/2022    Procedure: COLONOSCOPY WITH PROPOFOL;  Surgeon: Corbin Ade, MD;  Location: [facility];  Service: Endoscopy;  Laterality: N/A;  10:00am; ASA 3   hemorhoidectomy       IR IMAGING GUIDED PORT INSERTION   05/12/2022   POLYPECTOMY   04/26/2022    Procedure: POLYPECTOMY;  Surgeon: Corbin Ade, MD;  Location: [facility];  Service: Endoscopy;;          Home Medications:  Allergies as of 06/02/2022   No Known Allergies         Medication List           Accurate as of June 02, 2022  9:17 AM. If you have any questions, ask your nurse or doctor.              acidophilus Caps capsule Take 1 capsule by mouth daily.    albuterol 108 (90 Base) MCG/ACT inhaler Commonly known as: VENTOLIN HFA Inhale 2 puffs into the lungs every 6 (six) hours as needed for wheezing or shortness of breath.    allopurinol 300 MG tablet Commonly known as: ZYLOPRIM Take 300 mg by mouth daily.    atorvastatin 20 MG tablet Commonly known as: LIPITOR Take 20 mg by mouth daily.    dextrose 5 % SOLN 1,000 mL with fluorouracil 5 GM/100ML SOLN Inject into the vein.    Jardiance 10 MG Tabs tablet Generic drug: empagliflozin Take 20 mg by mouth daily.    LEUCOVORIN CALCIUM IV Inject into  the vein.    lidocaine-prilocaine cream Commonly known as: EMLA Apply 1 Application topically as needed.    lisinopril 20 MG tablet Commonly known as: ZESTRIL Take 20 mg by mouth daily.    loperamide 2 MG capsule Commonly known as: IMODIUM Take 2 mg by mouth 3 (three) times daily as needed for diarrhea or loose stools.    MELATONIN PO Take 1 tablet by mouth at bedtime as needed (sleep).    OXALIPLATIN IV Inject into the vein.    prochlorperazine 10 MG tablet Commonly known as: COMPAZINE Take 1 tablet (10 mg total) by mouth every 6 (six) hours as needed for nausea or vomiting.             Allergies:  Allergies  No Known Allergies     Family History: No family history on file.       Social History:  reports that he quit smoking about 5 years ago. His smoking use included cigarettes. He has never used smokeless tobacco. He reports that he does not use drugs. No history on file for alcohol use.   ROS: All  other review of systems were reviewed and are negative except what is noted above in HPI   Physical Exam: BP 109/73   Pulse 76   Ht 5\' 11"  (1.803 m)   BMI 24.67 kg/m   Constitutional:  Alert and oriented, No acute distress. HEENT: [place] AT, moist mucus membranes.  Trachea midline, no masses. Cardiovascular: No clubbing, cyanosis, or edema. Respiratory: Normal respiratory effort, no increased work of breathing. GI: Abdomen is soft, nontender, nondistended, no abdominal masses GU: No CVA tenderness.  Lymph: No cervical or inguinal lymphadenopathy. Skin: No rashes, bruises or suspicious lesions. Neurologic: Grossly intact, no focal deficits, moving all 4 extremities. Psychiatric: Normal mood and affect.   Laboratory Data: Recent Labs       Lab Results  Component Value Date    WBC 8.9 04/26/2022    HGB 15.8 04/26/2022    HCT 48.4 04/26/2022    MCV 91.8 04/26/2022    PLT 201 04/26/2022        Recent Labs       Lab Results  Component Value Date     CREATININE 0.81 04/26/2022        Recent Labs       Lab Results  Component Value Date    PSA 3.1 10/03/2018    PSA 1.7 10/03/2017        Recent Labs  No results found for: "TESTOSTERONE"     Recent Labs       Lab Results  Component Value Date    HGBA1C 6.9 (H) 04/10/2019        Urinalysis Labs (Brief)  No results found for: "COLORURINE", "APPEARANCEUR", "LABSPEC", "PHURINE", "GLUCOSEU", "HGBUR", "BILIRUBINUR", "KETONESUR", "PROTEINUR", "UROBILINOGEN", "NITRITE", "LEUKOCYTESUR"     Recent Labs  No results found for: "LABMICR", "WBCUA", "RBCUA", "LABEPIT", "MUCUS", "BACTERIA"     Pertinent Imaging: CT 05/09/2022: Images reviewed and discussed with the patient  No results found for this or any previous visit.   Results for orders placed during the hospital encounter of 10/07/18   US Venous Img Lower Bilateral   Narrative CLINICAL DATA:  Bilateral lower extremity swelling.   EXAM: BILATERAL LOWER EXTREMITY VENOUS DOPPLER ULTRASOUND   TECHNIQUE: Gray-scale sonography with graded compression, as well as color Doppler and duplex ultrasound were performed to evaluate the lower extremity deep venous systems from the level of the common femoral vein and including the common femoral, femoral, profunda femoral, popliteal and calf veins including the posterior tibial, peroneal and gastrocnemius veins when visible. The superficial great saphenous vein was also interrogated. Spectral Doppler was utilized to evaluate flow at rest and with distal augmentation maneuvers in the common femoral, femoral and popliteal veins.   COMPARISON:  None.   FINDINGS: RIGHT LOWER EXTREMITY   Common Femoral Vein: No evidence of thrombus. Normal compressibility, respiratory phasicity and response to augmentation.   Saphenofemoral Junction: No evidence of thrombus. Normal compressibility and flow on color Doppler imaging.   Profunda Femoral Vein: No evidence of thrombus.  Normal compressibility and flow on color Doppler imaging.   Femoral Vein: No evidence of thrombus. Normal compressibility, respiratory phasicity and response to augmentation.   Popliteal Vein: No evidence of thrombus. Normal compressibility, respiratory phasicity and response to augmentation.   Calf Veins: No evidence of thrombus. Normal compressibility and flow on color Doppler imaging.   Superficial Great Saphenous Vein: No evidence of thrombus. Normal compressibility.   Other Findings:  None.   LEFT LOWER EXTREMITY   Common Femoral Vein: No evidence of thrombus.  Normal compressibility, respiratory phasicity and response to augmentation.   Saphenofemoral Junction: No evidence of thrombus. Normal compressibility and flow on color Doppler imaging.   Profunda Femoral Vein: No evidence of thrombus. Normal compressibility and flow on color Doppler imaging.   Femoral Vein: No evidence of thrombus. Normal compressibility, respiratory phasicity and response to augmentation.   Popliteal Vein: No evidence of thrombus. Normal compressibility, respiratory phasicity and response to augmentation.   Calf Veins: No evidence of thrombus. Normal compressibility and flow on color Doppler imaging.   Other Findings: None.   IMPRESSION: No evidence of deep venous thrombosis in either lower extremity.     Electronically Signed By: Maisie Fus  Register On: 10/07/2018 14:00   No results found for this or any previous visit.   No results found for this or any previous visit.   No results found for this or any previous visit.   No valid procedures specified. Results for orders placed during the hospital encounter of 05/09/22   CT HEMATURIA WORKUP   Narrative CLINICAL DATA:  Hematuria. Bladder mass on recent MRI. Rectal carcinoma. * Tracking Code: BO *   EXAM: CT ABDOMEN AND PELVIS WITHOUT AND WITH CONTRAST   TECHNIQUE: Multidetector CT imaging of the abdomen and pelvis was  performed following the standard protocol before and following the bolus administration of intravenous contrast.   RADIATION DOSE REDUCTION: This exam was performed according to the departmental dose-optimization program which includes automated exposure control, adjustment of the mA and/or kV according to patient size and/or use of iterative reconstruction technique.   CONTRAST:  OMNIPAQUE IOHEXOL 300 MG/ML  SOLN   COMPARISON:  05/01/2022   FINDINGS: Lower Chest: No acute findings.   Hepatobiliary: No hepatic masses identified. Gallbladder is unremarkable. No evidence of biliary ductal dilatation.   Pancreas:  No mass or inflammatory changes.   Spleen: Within normal limits in size and appearance.   Adrenals/Urinary Tract: No adrenal masses identified. Few tiny 1-2 mm calculi are noted in both kidneys. No evidence of ureteral calculi or hydronephrosis. No suspicious renal masses identified. No masses seen involving the collecting systems or ureters. Mild diffuse bladder wall thickening and trabeculation is seen which may be due to chronic bladder outlet obstruction or cystitis. A focal enhancing soft tissue nodule measuring approximately 1.2 cm is seen along the left lateral bladder wall, suspicious for bladder carcinoma.   Stomach/Bowel: Concentric rectal wall thickening and enhancement is seen, consistent with known primary rectal carcinoma. No evidence of obstruction, inflammatory process or abnormal fluid collections. Normal appendix visualized. Diverticulosis is seen mainly involving the descending and sigmoid colon, however there is no evidence of diverticulitis.   Vascular/Lymphatic: No pathologically enlarged lymph nodes. No acute vascular findings. Aortic atherosclerotic calcification incidentally noted. 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   Reproductive:  No mass or other significant abnormality.   Other:   None.   Musculoskeletal:  No suspicious bone lesions identified.   IMPRESSION: 1.2 cm focal enhancing soft tissue nodule along the left lateral bladder wall, suspicious for bladder carcinoma.   Low rectal soft tissue mass, consistent with known primary rectal carcinoma.   No evidence of metastatic disease within the abdomen or pelvis.   Mild diffuse bladder wall thickening and trabeculation, which may be due to chronic bladder outlet obstruction or cystitis.   Tiny nonobstructing bilateral renal calculi.   Colonic diverticulosis, without radiographic evidence of diverticulitis.   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   Aortic Atherosclerosis (ICD10-I70.0).     Electronically Signed By: Danae Orleans M.D. On: 05/09/2022 10:58   No results found for this or any previous visit.     Assessment & Plan:     1. Bladder mass -We discussed the management including transurethral resection. After discussing the treatment the patient wishes to proceed with surgery. Risks/benefits/alternatives discussed

## 2022-11-16 NOTE — Transfer of Care (Signed)
Immediate Anesthesia Transfer of Care Note  Patient: James Miles  Procedure(s) Performed: CYSTOSCOPY (Bladder) TRANSURETHRAL RESECTION OF BLADDER TUMOR (TURBT) (Bladder) BLADDER INSTILLATION- Gemcitibine (Bladder)  Patient Location: PACU  Anesthesia Type:General  Level of Consciousness: drowsy and patient cooperative  Airway & Oxygen Therapy: Patient Spontanous Breathing and Patient connected to face mask oxygen  Post-op Assessment: Report given to RN and Post -op Vital signs reviewed and stable  Post vital signs: Reviewed and stable  Last Vitals:  Vitals Value Taken Time  BP 134/72 11/16/22 1045  Temp 36.4 C 11/16/22 1041  Pulse 62 11/16/22 1046  Resp 14 11/16/22 1046  SpO2 98 % 11/16/22 1046  Vitals shown include unfiled device data.  Last Pain:  Vitals:   11/16/22 0915  PainSc: 0-No pain         Complications: No notable events documented.

## 2022-11-16 NOTE — Op Note (Signed)
.  Preoperative diagnosis: bladder tumor  Postoperative diagnosis: Same  Procedure: 1 cystoscopy 2.Transurethral resection of bladder tumor, medium 3. Instillation of bladder chemotherapy agent  Attending: Cleda Mccreedy  Anesthesia: General  Estimated blood loss: Minimal  Drains: 22 French foley  Specimens: bladder tumor  Antibiotics: ancef  Findings:  3cm sessile left lateral wall tumor. Diffuse erythema involving the trigone and left lateral wall. Ureteral orifices in normal anatomic location.   Indications: Patient is a 77 year old male with a history of bladder tumor found on office cystoscopy.  After discussing treatment options, they decided proceed with transurethral resection of a bladder tumor.  Procedure in detail: The patient was brought to the operating room and a brief timeout was done to ensure correct patient, correct procedure, correct site.  General anesthesia was administered patient was placed in dorsal lithotomy position.  Their genitalia was then prepped and draped in usual sterile fashion.  A rigid 22 French cystoscope was passed in the urethra and the bladder.  Bladder was inspected and we noted a 3cm bladder tumor.  the ureteral orifices were in the normal orthotopic locations.  We then removed the cystoscope and placed a resectoscope into the bladder. We proceeded to remove the large clot burden from the bladder. Using the bipolar resectoscope we removed the bladder tumor down to the base. Hemostasis was then obtained with electrocautery. We then removed the bladder tumor chips and sent them for pathology. We then re-inspected the bladder and found no residual bleeding.  The bladder was then drained, a 22 French foley was placed and 2g of gemcitabine was instilled into the bladder. This concluded the procedure which was well tolerated by patient.  Complications: None  Condition: Stable, extubated, transferred to PACU  Plan: Patient is to have to catheter  drained in 1 hour. He will be discharged home and followup in 5 days for foley catheter removal and pathology discussion.

## 2022-11-17 LAB — SURGICAL PATHOLOGY

## 2022-11-21 ENCOUNTER — Other Ambulatory Visit: Payer: Self-pay

## 2022-11-21 ENCOUNTER — Encounter (HOSPITAL_COMMUNITY): Payer: Self-pay | Admitting: Urology

## 2022-11-22 ENCOUNTER — Ambulatory Visit: Payer: PPO | Admitting: Urology

## 2022-11-22 VITALS — BP 164/80 | HR 73

## 2022-11-22 DIAGNOSIS — Z8603 Personal history of neoplasm of uncertain behavior: Secondary | ICD-10-CM | POA: Diagnosis not present

## 2022-11-22 DIAGNOSIS — N3289 Other specified disorders of bladder: Secondary | ICD-10-CM

## 2022-11-22 DIAGNOSIS — Z09 Encounter for follow-up examination after completed treatment for conditions other than malignant neoplasm: Secondary | ICD-10-CM | POA: Diagnosis not present

## 2022-11-22 MED ORDER — SULFAMETHOXAZOLE-TRIMETHOPRIM 800-160 MG PO TABS: 1.0000 | ORAL_TABLET | Freq: Two times a day (BID) | ORAL | 0 refills | Status: DC

## 2022-11-22 MED ORDER — CIPROFLOXACIN HCL 500 MG PO TABS
500.0000 mg | ORAL_TABLET | Freq: Once | ORAL | Status: DC
Start: 2022-11-22 — End: 2023-02-23

## 2022-11-22 NOTE — Progress Notes (Unsigned)
Fill and Pull Catheter Removal  Patient is present today for a catheter removal.  Patient was cleaned and prepped in a sterile fashion 60ml of sterile water/ saline was instilled into the bladder when the patient began to have bladder spasms. Per Dr. Ronne Binning ok to pull catheter. 10ml of water was then drained from the balloon.  A 22FR foley cath was removed from the bladder no complications were noted .  Patient tolerated well.  Performed by: Yareliz Thorstenson LPN  Follow up/ Additional notes: Per MD note

## 2022-11-22 NOTE — Progress Notes (Signed)
11/22/2022 11:07 AM   [name] [birth year] [id number]  Referring provider: Ponciano Ort [facility] [address] [place],  [place] [address]  Followup bladder tumor resection   HPI: [name] is a 77yo here for followup after bladder tumor resection. Pathology benign. Voiding trial passed today. No hematuria.    PMH: Past Medical History:  Diagnosis Date   Back pain    COPD (chronic obstructive pulmonary disease) (HCC)    Diabetes mellitus without complication (HCC)    Dyspnea    Dysrhythmia    Hyperlipidemia    Oxygen dependent    4L when needed    Surgical History: Past Surgical History:  Procedure Laterality Date   A-FLUTTER ABLATION N/A 08/23/2022   Procedure: A-FLUTTER ABLATION;  Surgeon: Marinus Maw, MD;  Location: [facility];  Service: Cardiovascular;  Laterality: N/A;   BIOPSY  04/26/2022   Procedure: BIOPSY;  Surgeon: Corbin Ade, MD;  Location: [facility];  Service: Endoscopy;;   BLADDER INSTILLATION N/A 11/16/2022   Procedure: BLADDER INSTILLATION- Gemcitibine;  Surgeon: Malen Gauze, MD;  Location: [facility] OR[facility];  Service: Urology;  Laterality: N/A;   COLONOSCOPY WITH PROPOFOL N/A 04/26/2022   Procedure: COLONOSCOPY WITH PROPOFOL;  Surgeon: Corbin Ade, MD;  Location: [facility];  Service: Endoscopy;  Laterality: N/A;  10:00am; ASA 3   CYSTOSCOPY N/A 11/16/2022   Procedure: CYSTOSCOPY;  Surgeon: Malen Gauze, MD;  Location: [facility];  Service: Urology;  Laterality: N/A;   hemorhoidectomy     IR IMAGING GUIDED PORT INSERTION  05/12/2022   POLYPECTOMY  04/26/2022   Procedure: POLYPECTOMY;  Surgeon: Corbin Ade, MD;  Location: [facility];  Service: Endoscopy;;   TRANSURETHRAL RESECTION OF BLADDER TUMOR N/A 11/16/2022   Procedure: TRANSURETHRAL RESECTION OF BLADDER TUMOR (TURBT);  Surgeon: Malen Gauze, MD;  Location: [facility];  Service: Urology;  Laterality: N/A;    Home Medications:  Allergies as of 11/22/2022    No Known Allergies      Medication List        Accurate as of November 22, 2022 11:07 AM. If you have any questions, ask your nurse or doctor.          acidophilus Caps capsule Take 1 capsule by mouth daily.   albuterol 108 (90 Base) MCG/ACT inhaler Commonly known as: VENTOLIN HFA Inhale 2 puffs into the lungs every 6 (six) hours as needed for wheezing or shortness of breath.   atorvastatin 20 MG tablet Commonly known as: LIPITOR Take 20 mg by mouth daily.   capecitabine 500 MG tablet Commonly known as: Xeloda Take 3 tablets (1,500 mg total) by mouth 2 (two) times daily after a meal. Take Monday-Friday. Take only on days of radiation.   ciprofloxacin 500 MG tablet Commonly known as: Cipro Take 1 tablet (500 mg total) by mouth 2 (two) times daily.   dextrose 5 % SOLN 1,000 mL with fluorouracil 5 GM/100ML SOLN Inject into the vein.   empagliflozin 25 MG Tabs tablet Commonly known as: JARDIANCE Take 25 mg by mouth daily.   febuxostat 40 MG tablet Commonly known as: ULORIC Take 1 tablet (40 mg total) by mouth daily.   fenofibrate 54 MG tablet Take 54 mg by mouth daily.   HYDROcodone-acetaminophen 5-325 MG tablet Commonly known as: Norco Take 1 tablet by mouth every 6 (six) hours as needed for moderate pain.   LEUCOVORIN CALCIUM IV Inject into the vein.   lidocaine-prilocaine cream  Commonly known as: EMLA Apply 1 Application topically as needed.   loperamide 2 MG capsule Commonly known as: IMODIUM Take 2 mg by mouth 3 (three) times daily as needed for diarrhea or loose stools.   MELATONIN PO Take 1 tablet by mouth at bedtime as needed (sleep).   Misc. Devices Misc Please provide with shower chair   nitrofurantoin (macrocrystal-monohydrate) 100 MG capsule Commonly known as: MACROBID Take 1 capsule (100 mg total) by mouth every 12 (twelve) hours.   OXALIPLATIN IV Inject into the vein.   prochlorperazine 10 MG tablet Commonly known as:  COMPAZINE Take 1 tablet (10 mg total) by mouth every 6 (six) hours as needed for nausea or vomiting.   sulfamethoxazole-trimethoprim 800-160 MG tablet Commonly known as: BACTRIM DS Take 1 tablet by mouth 2 (two) times daily.        Allergies: No Known Allergies  Family History: No family history on file.  Social History:  reports that he quit smoking about 6 years ago. His smoking use included cigarettes. He started smoking about 56 years ago. He has never used smokeless tobacco. He reports that he does not drink alcohol and does not use drugs.  ROS: All other review of systems were reviewed and are negative except what is noted above in HPI  Physical Exam: BP (!) 164/80   Pulse 73   Constitutional:  Alert and oriented, No acute distress. HEENT: [place] AT, moist mucus membranes.  Trachea midline, no masses. Cardiovascular: No clubbing, cyanosis, or edema. Respiratory: Normal respiratory effort, no increased work of breathing. GI: Abdomen is soft, nontender, nondistended, no abdominal masses GU: No CVA tenderness.  Lymph: No cervical or inguinal lymphadenopathy. Skin: No rashes, bruises or suspicious lesions. Neurologic: Grossly intact, no focal deficits, moving all 4 extremities. Psychiatric: Normal mood and affect.  Laboratory Data: Lab Results  Component Value Date   WBC 7.8 11/06/2022   HGB 13.0 11/06/2022   HCT 41.1 11/06/2022   MCV 99.8 11/06/2022   PLT 214 11/06/2022    Lab Results  Component Value Date   CREATININE 0.79 11/06/2022    Lab Results  Component Value Date   PSA 3.1 10/03/2018   PSA 1.7 10/03/2017    No results found for: "TESTOSTERONE"  Lab Results  Component Value Date   HGBA1C 7.8 (H) 07/08/2022    Urinalysis    Component Value Date/Time   COLORURINE YELLOW 09/25/2022 0936   APPEARANCEUR Cloudy (A) 10/18/2022 1513   LABSPEC 1.026 09/25/2022 0936   PHURINE 5.0 09/25/2022 0936   GLUCOSEU 3+ (A) 10/18/2022 1513   HGBUR MODERATE (A)  09/25/2022 0936   BILIRUBINUR Negative 10/18/2022 1513   KETONESUR 5 (A) 09/25/2022 0936   PROTEINUR 2+ (A) 10/18/2022 1513   PROTEINUR 100 (A) 09/25/2022 0936   NITRITE Positive (A) 10/18/2022 1513   NITRITE POSITIVE (A) 09/25/2022 0936   LEUKOCYTESUR 1+ (A) 10/18/2022 1513   LEUKOCYTESUR MODERATE (A) 09/25/2022 0936    Lab Results  Component Value Date   LABMICR See below: 10/18/2022   WBCUA 11-30 (A) 10/18/2022   LABEPIT 0-10 10/18/2022   BACTERIA Many (A) 10/18/2022    Pertinent Imaging:  No results found for this or any previous visit.  Results for orders placed during the hospital encounter of 10/07/18  US Venous Img Lower Bilateral  Narrative CLINICAL DATA:  Bilateral lower extremity swelling.  EXAM: BILATERAL LOWER EXTREMITY VENOUS DOPPLER ULTRASOUND  TECHNIQUE: Gray-scale sonography with graded compression, as well as color Doppler and duplex ultrasound were  performed to evaluate the lower extremity deep venous systems from the level of the common femoral vein and including the common femoral, femoral, profunda femoral, popliteal and calf veins including the posterior tibial, peroneal and gastrocnemius veins when visible. The superficial great saphenous vein was also interrogated. Spectral Doppler was utilized to evaluate flow at rest and with distal augmentation maneuvers in the common femoral, femoral and popliteal veins.  COMPARISON:  None.  FINDINGS: RIGHT LOWER EXTREMITY  Common Femoral Vein: No evidence of thrombus. Normal compressibility, respiratory phasicity and response to augmentation.  Saphenofemoral Junction: No evidence of thrombus. Normal compressibility and flow on color Doppler imaging.  Profunda Femoral Vein: No evidence of thrombus. Normal compressibility and flow on color Doppler imaging.  Femoral Vein: No evidence of thrombus. Normal compressibility, respiratory phasicity and response to augmentation.  Popliteal Vein: No  evidence of thrombus. Normal compressibility, respiratory phasicity and response to augmentation.  Calf Veins: No evidence of thrombus. Normal compressibility and flow on color Doppler imaging.  Superficial Great Saphenous Vein: No evidence of thrombus. Normal compressibility.  Other Findings:  None.  LEFT LOWER EXTREMITY  Common Femoral Vein: No evidence of thrombus. Normal compressibility, respiratory phasicity and response to augmentation.  Saphenofemoral Junction: No evidence of thrombus. Normal compressibility and flow on color Doppler imaging.  Profunda Femoral Vein: No evidence of thrombus. Normal compressibility and flow on color Doppler imaging.  Femoral Vein: No evidence of thrombus. Normal compressibility, respiratory phasicity and response to augmentation.  Popliteal Vein: No evidence of thrombus. Normal compressibility, respiratory phasicity and response to augmentation.  Calf Veins: No evidence of thrombus. Normal compressibility and flow on color Doppler imaging.  Other Findings: None.  IMPRESSION: No evidence of deep venous thrombosis in either lower extremity.   Electronically Signed By: Maisie Fus  Register On: 10/07/2018 14:00  No results found for this or any previous visit.  No results found for this or any previous visit.  No results found for this or any previous visit.  No valid procedures specified. Results for orders placed during the hospital encounter of 05/09/22  CT HEMATURIA WORKUP  Narrative CLINICAL DATA:  Hematuria. Bladder mass on recent MRI. Rectal carcinoma. * Tracking Code: BO *  EXAM: CT ABDOMEN AND PELVIS WITHOUT AND WITH CONTRAST  TECHNIQUE: Multidetector CT imaging of the abdomen and pelvis was performed following the standard protocol before and following the bolus administration of intravenous contrast.  RADIATION DOSE REDUCTION: This exam was performed according to the departmental dose-optimization program which  includes automated exposure control, adjustment of the mA and/or kV according to patient size and/or use of iterative reconstruction technique.  CONTRAST:  OMNIPAQUE IOHEXOL 300 MG/ML  SOLN  COMPARISON:  05/01/2022  FINDINGS: Lower Chest: No acute findings.  Hepatobiliary: No hepatic masses identified. Gallbladder is unremarkable. No evidence of biliary ductal dilatation.  Pancreas:  No mass or inflammatory changes.  Spleen: Within normal limits in size and appearance.  Adrenals/Urinary Tract: No adrenal masses identified. Few tiny 1-2 mm calculi are noted in both kidneys. No evidence of ureteral calculi or hydronephrosis. No suspicious renal masses identified. No masses seen involving the collecting systems or ureters. Mild diffuse bladder wall thickening and trabeculation is seen which may be due to chronic bladder outlet obstruction or cystitis. A focal enhancing soft tissue nodule measuring approximately 1.2 cm is seen along the left lateral bladder wall, suspicious for bladder carcinoma.  Stomach/Bowel: Concentric rectal wall thickening and enhancement is seen, consistent with known primary rectal carcinoma. No evidence of  obstruction, inflammatory process or abnormal fluid collections. Normal appendix visualized. Diverticulosis is seen mainly involving the descending and sigmoid colon, however there is no evidence of diverticulitis.  Vascular/Lymphatic: No pathologically enlarged lymph nodes. No acute vascular findings. Aortic atherosclerotic calcification incidentally noted. 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  Reproductive:  No mass or other significant abnormality.  Other:  None.  Musculoskeletal:  No suspicious bone lesions identified.  IMPRESSION: 1.2 cm focal enhancing soft tissue nodule along the left lateral bladder wall, suspicious for bladder carcinoma.  Low rectal soft tissue mass, consistent  with known primary rectal carcinoma.  No evidence of metastatic disease within the abdomen or pelvis.  Mild diffuse bladder wall thickening and trabeculation, which may be due to chronic bladder outlet obstruction or cystitis.  Tiny nonobstructing bilateral renal calculi.  Colonic diverticulosis, without radiographic evidence of diverticulitis.  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  Aortic Atherosclerosis (ICD10-I70.0).   Electronically Signed By: Danae Orleans M.D. On: 05/09/2022 10:58  No results found for this or any previous visit.   Assessment & Plan:    1. Bladder mass -Pathology was benign. We will start bactrim DS BID for 3 weeks and he will followup in 3 months for repeat cystoscopy - Bladder Voiding Trial - ciprofloxacin (CIPRO) tablet 500 mg   No follow-ups on file.  Wilkie Aye, MD  [facility] [place]

## 2022-11-27 ENCOUNTER — Inpatient Hospital Stay: Payer: PPO

## 2022-11-27 ENCOUNTER — Inpatient Hospital Stay: Payer: PPO | Admitting: Hematology

## 2022-11-30 ENCOUNTER — Encounter: Payer: Self-pay | Admitting: Urology

## 2022-12-08 ENCOUNTER — Ambulatory Visit: Payer: PPO | Admitting: Internal Medicine

## 2022-12-14 ENCOUNTER — Other Ambulatory Visit: Payer: Self-pay

## 2022-12-14 NOTE — Progress Notes (Signed)
Specialty Pharmacy Ongoing Clinical Assessment Note  James Miles is a 77 y.o. male who is being followed by the specialty pharmacy service for RxSp Oncology   Patient's specialty medication(s) reviewed today: Capecitabine   Missed doses in the last 4 weeks: 0   Patient/Caregiver asked additional questions regarding Xeloda directions. James Standard, RN will follow up with patient.   Therapeutic benefit summary: Unable to assess   Adverse events/side effects summary: No adverse events/side effects   Patient's therapy is appropriate to: Continue    Goals Addressed             This Visit's Progress    Stabilization of disease       Patient is on track. Patient will maintain adherence         Follow up:  no additional follow up at this time.   James Miles Specialty Pharmacist

## 2022-12-14 NOTE — Progress Notes (Signed)
Clinical Intervention Note  Clinical Intervention Notes: James Miles's son [name] inquired about the plan for his father's Xeloda in regards to upcoming radiation. Bryan requested confirmation from both Oncology and Urology. Patient has about 90 tablets of Xeloda on hand and medication is currently on hold. Followed up with Oncology [facility] to provided additional informatoin and will reach back out to [facility] and patient.   Clinical Intervention Outcomes: Improved therapy adherence   Bobette Mo Specialty Pharmacist

## 2022-12-25 ENCOUNTER — Inpatient Hospital Stay: Payer: PPO | Attending: Hematology | Admitting: Oncology

## 2022-12-25 ENCOUNTER — Inpatient Hospital Stay: Payer: PPO

## 2022-12-25 VITALS — BP 108/63 | HR 68 | Temp 97.9°F | Resp 17 | Wt 173.9 lb

## 2022-12-25 DIAGNOSIS — N329 Bladder disorder, unspecified: Secondary | ICD-10-CM | POA: Insufficient documentation

## 2022-12-25 DIAGNOSIS — M109 Gout, unspecified: Secondary | ICD-10-CM | POA: Diagnosis not present

## 2022-12-25 DIAGNOSIS — D4122 Neoplasm of uncertain behavior of left ureter: Secondary | ICD-10-CM | POA: Diagnosis not present

## 2022-12-25 DIAGNOSIS — Z7901 Long term (current) use of anticoagulants: Secondary | ICD-10-CM | POA: Insufficient documentation

## 2022-12-25 DIAGNOSIS — Z79899 Other long term (current) drug therapy: Secondary | ICD-10-CM | POA: Insufficient documentation

## 2022-12-25 DIAGNOSIS — Z87891 Personal history of nicotine dependence: Secondary | ICD-10-CM | POA: Insufficient documentation

## 2022-12-25 DIAGNOSIS — I4892 Unspecified atrial flutter: Secondary | ICD-10-CM | POA: Diagnosis not present

## 2022-12-25 DIAGNOSIS — Z452 Encounter for adjustment and management of vascular access device: Secondary | ICD-10-CM | POA: Diagnosis not present

## 2022-12-25 DIAGNOSIS — C20 Malignant neoplasm of rectum: Secondary | ICD-10-CM | POA: Insufficient documentation

## 2022-12-25 DIAGNOSIS — Z95828 Presence of other vascular implants and grafts: Secondary | ICD-10-CM

## 2022-12-25 LAB — CBC WITH DIFFERENTIAL/PLATELET
Abs Immature Granulocytes: 0.07 10*3/uL (ref 0.00–0.07)
Basophils Absolute: 0.1 10*3/uL (ref 0.0–0.1)
Basophils Relative: 1 %
Eosinophils Absolute: 0.1 10*3/uL (ref 0.0–0.5)
Eosinophils Relative: 1 %
HCT: 45.4 % (ref 39.0–52.0)
Hemoglobin: 14.5 g/dL (ref 13.0–17.0)
Immature Granulocytes: 1 %
Lymphocytes Relative: 32 %
Lymphs Abs: 2.9 10*3/uL (ref 0.7–4.0)
MCH: 30.8 pg (ref 26.0–34.0)
MCHC: 31.9 g/dL (ref 30.0–36.0)
MCV: 96.4 fL (ref 80.0–100.0)
Monocytes Absolute: 0.7 10*3/uL (ref 0.1–1.0)
Monocytes Relative: 8 %
Neutro Abs: 5.3 10*3/uL (ref 1.7–7.7)
Neutrophils Relative %: 57 %
Platelets: 257 10*3/uL (ref 150–400)
RBC: 4.71 MIL/uL (ref 4.22–5.81)
RDW: 14 % (ref 11.5–15.5)
WBC: 9.2 10*3/uL (ref 4.0–10.5)
nRBC: 0 % (ref 0.0–0.2)

## 2022-12-25 LAB — COMPREHENSIVE METABOLIC PANEL
ALT: 19 U/L (ref 0–44)
AST: 16 U/L (ref 15–41)
Albumin: 4 g/dL (ref 3.5–5.0)
Alkaline Phosphatase: 56 U/L (ref 38–126)
Anion gap: 8 (ref 5–15)
BUN: 22 mg/dL (ref 8–23)
CO2: 26 mmol/L (ref 22–32)
Calcium: 8.7 mg/dL — ABNORMAL LOW (ref 8.9–10.3)
Chloride: 102 mmol/L (ref 98–111)
Creatinine, Ser: 0.94 mg/dL (ref 0.61–1.24)
GFR, Estimated: >60 mL/min (ref >60)
Glucose, Bld: 123 mg/dL — ABNORMAL HIGH (ref 70–99)
Potassium: 4.2 mmol/L (ref 3.5–5.1)
Sodium: 136 mmol/L (ref 135–145)
Total Bilirubin: 0.6 mg/dL (ref <1.2)
Total Protein: 7.5 g/dL (ref 6.5–8.1)

## 2022-12-25 LAB — MAGNESIUM: Magnesium: 2.4 mg/dL (ref 1.7–2.4)

## 2022-12-25 MED ORDER — HEPARIN SOD (PORK) LOCK FLUSH 100 UNIT/ML IV SOLN
500.0000 [IU] | Freq: Once | INTRAVENOUS | Status: AC
  Administered 2022-12-25: 500 [IU] via INTRAVENOUS

## 2022-12-25 MED ORDER — SODIUM CHLORIDE 0.9% FLUSH
10.0000 mL | Freq: Once | INTRAVENOUS | Status: AC
  Administered 2022-12-25: 10 mL via INTRAVENOUS

## 2022-12-25 NOTE — Patient Instructions (Signed)
VISIT SUMMARY:  During today's visit, we discussed your current treatment plan for cancer, including the upcoming start of radiation therapy and the use of your oral chemotherapy medication. We also addressed your concerns about swelling in your legs and ankles, and reviewed the results of your recent biopsy, which ruled out bladder cancer.  YOUR PLAN:  -CANCER (UNSPECIFIED TYPE): Cancer is a disease where cells in the body grow uncontrollably. You are scheduled to start radiation therapy this week. Please ensure you have enough oral chemotherapy medication for the duration of your radiation therapy and take it on the days you receive radiation. We will check in with you weekly during your radiation therapy to monitor for any side effects and to ensure you are taking your medication as prescribed.  -LOWER EXTREMITY EDEMA: Lower extremity edema is swelling in the legs and ankles. You have been prescribed a diuretic by your primary care physician to help reduce the swelling, but you mentioned increased urination as a side effect. To help manage the swelling, please continue to elevate your legs when you sleep.  INSTRUCTIONS:  Your next appointment is scheduled for Monday, January 01, 2023.

## 2022-12-25 NOTE — Progress Notes (Signed)
Port flushed with good blood return noted. No bruising or swelling at site. Bandaid applied and patient discharged in satisfactory condition. VVS stable with no signs or symptoms of distressed noted. 

## 2022-12-25 NOTE — Progress Notes (Signed)
[facility] [address], [place] [address]    Clinic Day:  12/25/2022  Referring physician: [facility]  Patient Care Team: [place] as PCP - General Wendall Stade, MD as PCP - Cardiology (Cardiology) Marinus Maw, MD as PCP - Electrophysiology (Cardiology) Therese Sarah, RN as Oncology Nurse Navigator (Medical Oncology) Doreatha Massed, MD as Medical Oncologist (Medical Oncology)   ASSESSMENT & PLAN:   Assessment: 1.  Stage II (T3 N0 M0) low rectal adenocarcinoma, MMR preserved: - 12-month history of intermittent rectal bleeding.  No weight loss. - Colonoscopy (04/26/2022): Large cauliflower mass palpated in the distal rectum on DRE.  Scattered diverticula in the descending colon.  5 mm polyp in the ascending colon.  In the rectum, exophytic semilunar neoplastic appearing process beginning at the anal verge and extending proximally about 6 cm. - Pathology (04/26/2022): Invasive moderately differentiated adenocarcinoma of the rectal mass.  MMR preserved. - CT CAP (05/01/2022): Nodular wall thickening along the rectum.  No intrinsic abnormal lymph nodes.  No liver lesion.  Fatty liver.  Nodule along the left side of the urinary bladder.  Small mass along the course of the distal left ureter.  Worrisome for multifocal TCC.  Small lung nodules measuring up to 4 mm. - MRI pelvis (05/03/2022): 6.4 cm left lateral low rectal tumor abutting the internal anal sphincter, T3c N0.  Distance from tumor to the internal anal sphincter is 0 cm.  Tumor measures 6.4 cm in length and up to 2.2 cm in thickness. - PET scan (05/11/2022): No evidence of metastatic disease. - Met with Dr. Maisie Fus on 05/16/2022: She is in agreement with TNT and has discussed APR with colostomy. - TNT: FOLFOX cycle 1 started on 06/07/2022. Completed cycle 8 of chemotherapy on 09/25/2022. -- Plan to start chemo RT with Xeloda this week.   2.  Social/family history: -He  lives at home and is independent of ADLs and IADLs.  He is accompanied by his son today.  Worked at [employer] prior to retirement.  Also served in [place] but denied any exposure to agent orange.  Quit smoking 1 year ago.  Smoked 1 to 2 packs/day for the last 61 years.  Started at age 89. - No family history of malignancies.    Plan: 1.  Stage II (T3 cN0 M0) low rectal adenocarcinoma, MMR preserved: - Completed cycle 8 of chemotherapy on 09/25/2022. --Patient started Xeloda without starting radiation therapy due to some misunderstanding.  Patient now understands that it has to be taken only on the days of radiation treatment. - We talked about chemoradiation therapy with Xeloda 1500 mg twice daily Monday through Friday throughout the course of radiation.  Start on Wednesday when he starts treatment. - We discussed side effects including nausea/vomiting/diarrhea/rare chance of hand-foot skin reaction among others. - He will tentatively start radiation therapy after TURBT on 11/20/2022. - RTC 1 week after starting chemoradiation therapy with repeat labs and toxicity assessment.   2.  Left urinary bladder mass and distal left ureteral mass: - In office cystoscopy by Dr. Ronne Binning on 10/02/2022: 1 cm right lateral wall tumor with surrounding erythema, improved since last cystoscopy. - TURBT on 11/16/2022 showed a benign lesion on pathology.  Path consistent with acute on chronic cystitis.   3.  A-flutter with RVR: - Heart rate is stable since ablation. - Continue Xarelto and amiodarone.  Continues to be on oxygen.  4. Gout: - Discontinued allopurinol  due to interaction with Xeloda.  He has been on allopurinol for gout prevention for the past few years. - We will start him on febuxostat 40 mg daily.   The total time spent in the appointment was 30 minutes encounter with patients including review of chart and various tests results, discussions about plan of care and coordination of  care plan    All questions were answered. The patient knows to call the clinic with any problems, questions or concerns. No barriers to learning was detected.   Cindie Crumbly, MD   11/18/20244:22 PM  CHIEF COMPLAINT:   Diagnosis: Rectal adenocarcinoma    Cancer Staging  Rectal adenocarcinoma [facility]) Staging form: Colon and Rectum, AJCC 8th Edition - Clinical stage from 05/07/2022: Stage IIA (cT3, cN0, cM0) - Unsigned    Prior Therapy: FOLFOX, 8 cycles, 06/07/22 - 09/25/22  Current Therapy:  Concurrent chemoradiation with Xeloda, to be followed by surgery    HISTORY OF PRESENT ILLNESS:   Oncology History  Rectal adenocarcinoma (HCC)  05/07/2022 Initial Diagnosis   Rectal adenocarcinoma (HCC)   06/07/2022 -  Chemotherapy   Patient is on Treatment Plan : COLORECTAL FOLFOX q14d x 4 months        INTERVAL HISTORY:   James Miles is a 77 y.o. male presenting to clinic today for follow up of Rectal adenocarcinoma.  He is in a wheelchair and is accompanied by his son today.  Patient reported due to some miscommunication.  Patient's son expressed frustration about the misunderstanding.  He is scheduled to start radiation therapy this week.  He denies any adverse effects from Xeloda including dry skin, nausea, vomiting and diarrhea he did experience a couple of mouth sores, which resolved.   In addition to his cancer treatment, the patient also reports swelling in his legs and ankles. He has been prescribed a diuretic by his primary care physician, but expresses frustration about the increased urination.  Patient and son expressed frustration with misunderstanding on when to take medication and son expressed the need for second opinion.  I offered referral for second opinion but patient denied at this time.  Overall, patient has been at baseline.  His recent cystoscopy was negative for cancer.  PAST MEDICAL HISTORY:   Past Medical History: Past Medical History:  Diagnosis Date   Back pain     COPD (chronic obstructive pulmonary disease) (HCC)    Diabetes mellitus without complication (HCC)    Dyspnea    Dysrhythmia    Hyperlipidemia    Oxygen dependent    4L when needed    Surgical History: Past Surgical History:  Procedure Laterality Date   A-FLUTTER ABLATION N/A 08/23/2022   Procedure: A-FLUTTER ABLATION;  Surgeon: Marinus Maw, MD;  Location: [facility];  Service: Cardiovascular;  Laterality: N/A;   BIOPSY  04/26/2022   Procedure: BIOPSY;  Surgeon: Corbin Ade, MD;  Location: [facility];  Service: Endoscopy;;   BLADDER INSTILLATION N/A 11/16/2022   Procedure: BLADDER INSTILLATION- Gemcitibine;  Surgeon: Malen Gauze, MD;  Location: [facility] ORS;  Service: Urology;  Laterality: N/A;   COLONOSCOPY WITH PROPOFOL N/A 04/26/2022   Procedure: COLONOSCOPY WITH PROPOFOL;  Surgeon: Corbin Ade, MD;  Location: [facility];  Service: Endoscopy;  Laterality: N/A;  10:00am; ASA 3   CYSTOSCOPY N/A 11/16/2022   Procedure: CYSTOSCOPY;  Surgeon: Malen Gauze, MD;  Location: [facility] OR[facility];  Service: Urology;  Laterality: N/A;   hemorhoidectomy     IR IMAGING GUIDED PORT  INSERTION  05/12/2022   POLYPECTOMY  04/26/2022   Procedure: POLYPECTOMY;  Surgeon: Corbin Ade, MD;  Location: [facility];  Service: Endoscopy;;   TRANSURETHRAL RESECTION OF BLADDER TUMOR N/A 11/16/2022   Procedure: TRANSURETHRAL RESECTION OF BLADDER TUMOR (TURBT);  Surgeon: Malen Gauze, MD;  Location: [facility];  Service: Urology;  Laterality: N/A;    Social History: Social History   Socioeconomic History   Marital status: Widowed    Spouse name: Not on file   Number of children: 2   Years of education: Not on file   Highest education level: Not on file  Occupational History   Not on file  Tobacco Use   Smoking status: Former    Current packs/day: 0.00    Types: Cigarettes    Start date: 08/1966    Quit date: 08/2016    Years since quitting: 6.3   Smokeless tobacco:  Never  Vaping Use   Vaping status: Never Used  Substance and Sexual Activity   Alcohol use: Never   Drug use: Never   Sexual activity: Not Currently  Other Topics Concern   Not on file  Social History Narrative   Not on file   Social Determinants of Health   Financial Resource Strain: Not on file  Food Insecurity: No Food Insecurity (07/07/2022)   Hunger Vital Sign    Worried About Running Out of Food in the Last Year: Never true    Ran Out of Food in the Last Year: Never true  Transportation Needs: No Transportation Needs (07/07/2022)   PRAPARE - Administrator, Civil Service (Medical): No    Lack of Transportation (Non-Medical): No  Physical Activity: Not on file  Stress: Not on file  Social Connections: Not on file  Intimate Partner Violence: Not At Risk (07/07/2022)   Humiliation, Afraid, Rape, and Kick questionnaire    Fear of Current or Ex-Partner: No    Emotionally Abused: No    Physically Abused: No    Sexually Abused: No    Family History: No family history on file.  Current Medications:  Current Outpatient Medications:    acidophilus (RISAQUAD) CAPS capsule, Take 1 capsule by mouth daily., Disp: , Rfl:    albuterol (VENTOLIN HFA) 108 (90 Base) MCG/ACT inhaler, Inhale 2 puffs into the lungs every 6 (six) hours as needed for wheezing or shortness of breath., Disp: , Rfl:    atorvastatin (LIPITOR) 20 MG tablet, Take 20 mg by mouth daily., Disp: , Rfl:    capecitabine (XELODA) 500 MG tablet, Take 3 tablets (1,500 mg total) by mouth 2 (two) times daily after a meal. Take Monday-Friday. Take only on days of radiation., Disp: 168 tablet, Rfl: 0   ciprofloxacin (CIPRO) 500 MG tablet, Take 1 tablet (500 mg total) by mouth 2 (two) times daily., Disp: 14 tablet, Rfl: 0   dextrose 5 % SOLN 1,000 mL with fluorouracil 5 GM/100ML SOLN, Inject into the vein., Disp: , Rfl:    empagliflozin (JARDIANCE) 25 MG TABS tablet, Take 25 mg by mouth daily., Disp: , Rfl:     febuxostat (ULORIC) 40 MG tablet, Take 1 tablet (40 mg total) by mouth daily., Disp: 90 tablet, Rfl: 1   fenofibrate 54 MG tablet, Take 54 mg by mouth daily., Disp: , Rfl:    HYDROcodone-acetaminophen (NORCO) 5-325 MG tablet, Take 1 tablet by mouth every 6 (six) hours as needed for moderate pain., Disp: 15 tablet, Rfl: 0   LEUCOVORIN CALCIUM IV, Inject into  the vein., Disp: , Rfl:    lidocaine-prilocaine (EMLA) cream, Apply 1 Application topically as needed., Disp: 30 g, Rfl: 0   loperamide (IMODIUM) 2 MG capsule, Take 2 mg by mouth 3 (three) times daily as needed for diarrhea or loose stools., Disp: , Rfl:    MELATONIN PO, Take 1 tablet by mouth at bedtime as needed (sleep)., Disp: , Rfl:    Misc. Devices MISC, Please provide with shower chair, Disp: 1 Units, Rfl: 0   nitrofurantoin, macrocrystal-monohydrate, (MACROBID) 100 MG capsule, Take 1 capsule (100 mg total) by mouth every 12 (twelve) hours., Disp: 14 capsule, Rfl: 0   OXALIPLATIN IV, Inject into the vein., Disp: , Rfl:    prochlorperazine (COMPAZINE) 10 MG tablet, Take 1 tablet (10 mg total) by mouth every 6 (six) hours as needed for nausea or vomiting., Disp: 30 tablet, Rfl: 2   sulfamethoxazole-trimethoprim (BACTRIM DS) 800-160 MG tablet, Take 1 tablet by mouth 2 (two) times daily., Disp: 42 tablet, Rfl: 0  Current Facility-Administered Medications:    ciprofloxacin (CIPRO) tablet 500 mg, 500 mg, Oral, Once, McKenzie, Mardene Celeste, MD   ciprofloxacin (CIPRO) tablet 500 mg, 500 mg, Oral, Once, McKenzie, Mardene Celeste, MD   Allergies: No Known Allergies  REVIEW OF SYSTEMS:   Review of Systems  Constitutional:  Negative for chills, fatigue and fever.  HENT:   Negative for lump/mass, mouth sores, nosebleeds, sore throat and trouble swallowing.   Eyes:  Negative for eye problems.  Respiratory:  Negative for cough and shortness of breath.   Cardiovascular:  Positive for leg swelling. Negative for chest pain and palpitations.   Gastrointestinal:  Negative for abdominal pain, constipation, diarrhea, nausea and vomiting.  Genitourinary:  Positive for frequency. Negative for bladder incontinence, difficulty urinating, dysuria, hematuria and nocturia.   Musculoskeletal:  Negative for arthralgias, back pain, flank pain, myalgias and neck pain.  Skin:  Negative for itching and rash.  Neurological:  Negative for dizziness, headaches and numbness.  Hematological:  Does not bruise/bleed easily.  Psychiatric/Behavioral:  Negative for depression, sleep disturbance and suicidal ideas. The patient is not nervous/anxious.   All other systems reviewed and are negative.    VITALS:   There were no vitals taken for this visit.  Wt Readings from Last 3 Encounters:  12/25/22 173 lb 14.4 oz (78.9 kg)  11/13/22 175 lb (79.4 kg)  11/06/22 178 lb (80.7 kg)    There is no height or weight on file to calculate BMI.  Performance status (ECOG): 1 - Symptomatic but completely ambulatory  PHYSICAL EXAM:   GENERAL:alert, no distress and comfortable SKIN: skin color, texture, turgor are normal, no rashes or significant lesions LUNGS: clear to auscultation and percussion with normal breathing effort HEART: regular rate & rhythm and no murmurs and B/L pitting lower extremity edema ABDOMEN:abdomen soft, non-tender and normal bowel sounds Musculoskeletal:no cyanosis of digits and no clubbing  PSYCH: alert & oriented x 3 with fluent speech NEURO: no focal motor/sensory deficits   LABS:      Latest Ref Rng & Units 12/25/2022    8:56 AM 11/06/2022   10:44 AM 09/25/2022    8:00 AM  CBC  WBC 4.0 - 10.5 K/uL 9.2  7.8  6.9   Hemoglobin 13.0 - 17.0 g/dL 52.8  41.3  24.4   Hematocrit 39.0 - 52.0 % 45.4  41.1  38.1   Platelets 150 - 400 K/uL 257  214  217       Latest Ref Rng & Units  12/25/2022    8:56 AM 11/06/2022   10:44 AM 09/25/2022    8:00 AM  CMP  Glucose 70 - 99 mg/dL 272  536  644   BUN 8 - 23 mg/dL 22  11  9    Creatinine  0.61 - 1.24 mg/dL 0.34  7.42  5.95   Sodium 135 - 145 mmol/L 136  140  142   Potassium 3.5 - 5.1 mmol/L 4.2  3.8  3.4   [place] 98 - 111 mmol/L 102  103  103   CO2 22 - 32 mmol/L 26  25  27    Calcium 8.9 - 10.3 mg/dL 8.7  8.8  9.1   Total Protein 6.5 - 8.1 g/dL 7.5  7.1  7.5   Total Bilirubin <1.2 mg/dL 0.6  0.4  0.7   Alkaline Phos 38 - 126 U/L 56  63  64   AST 15 - 41 U/L 16  16  16    ALT 0 - 44 U/L 19  15  21       Lab Results  Component Value Date   CEA1 2.6 04/26/2022   /  CEA  Date Value Ref Range Status  04/26/2022 2.6 0.0 - 4.7 ng/mL Final    Comment:    (NOTE)                             Nonsmokers          <3.9                             Smokers             <5.6 [employer] Electrochemiluminescence Immunoassay (ECLIA) Values obtained with different assay methods or kits cannot be used interchangeably.  Results cannot be interpreted as absolute evidence of the presence or absence of malignant disease. Performed At: [facility] [address] [place], [place] [id number] Jolene Schimke MD IR:[id number]

## 2022-12-26 ENCOUNTER — Other Ambulatory Visit: Payer: Self-pay

## 2022-12-26 ENCOUNTER — Other Ambulatory Visit: Payer: Self-pay | Admitting: Pharmacist

## 2022-12-26 ENCOUNTER — Other Ambulatory Visit (HOSPITAL_COMMUNITY): Payer: Self-pay

## 2022-12-26 DIAGNOSIS — C20 Malignant neoplasm of rectum: Secondary | ICD-10-CM

## 2022-12-26 MED ORDER — CAPECITABINE 500 MG PO TABS: 1500.0000 mg | ORAL_TABLET | Freq: Two times a day (BID) | ORAL | 0 refills | Status: DC

## 2022-12-26 MED ORDER — CAPECITABINE 500 MG PO TABS
1500.0000 mg | ORAL_TABLET | Freq: Two times a day (BID) | ORAL | 0 refills | Status: DC
  Filled 2022-12-26: qty 90, 15d supply, fill #0

## 2022-12-26 NOTE — Addendum Note (Signed)
Addended by: Remi Haggard on: 12/26/2022 02:45 PM   Modules accepted: Orders

## 2022-12-26 NOTE — Progress Notes (Signed)
Specialty Pharmacy Refill Coordination Note  James Miles is a 77 y.o. male contacted today regarding refills of specialty medication(s) Capecitabine   Patient requested Delivery   Delivery date: 12/28/22   Verified address: [address] [place], [place] [address]   Medication will be filled on 12/27/22.  Patient is aware of $0.00 copay.    Ardeen Fillers, CPhT Oncology Pharmacy Patient Advocate  [facility]  [phone] (phone) [phone] (fax) 12/26/2022 2:51 PM

## 2022-12-27 ENCOUNTER — Other Ambulatory Visit (HOSPITAL_COMMUNITY): Payer: Self-pay

## 2023-01-01 ENCOUNTER — Inpatient Hospital Stay: Payer: PPO | Admitting: Oncology

## 2023-01-01 ENCOUNTER — Inpatient Hospital Stay: Payer: PPO

## 2023-01-03 ENCOUNTER — Inpatient Hospital Stay: Payer: PPO

## 2023-01-03 ENCOUNTER — Inpatient Hospital Stay: Payer: PPO | Admitting: Oncology

## 2023-01-03 DIAGNOSIS — C20 Malignant neoplasm of rectum: Secondary | ICD-10-CM | POA: Diagnosis not present

## 2023-01-03 LAB — CBC WITH DIFFERENTIAL/PLATELET
Abs Immature Granulocytes: 0.05 10*3/uL (ref 0.00–0.07)
Basophils Absolute: 0 10*3/uL (ref 0.0–0.1)
Basophils Relative: 0 %
Eosinophils Absolute: 0.2 10*3/uL (ref 0.0–0.5)
Eosinophils Relative: 2 %
HCT: 42.3 % (ref 39.0–52.0)
Hemoglobin: 13.7 g/dL (ref 13.0–17.0)
Immature Granulocytes: 1 %
Lymphocytes Relative: 15 %
Lymphs Abs: 1.3 10*3/uL (ref 0.7–4.0)
MCH: 31.1 pg (ref 26.0–34.0)
MCHC: 32.4 g/dL (ref 30.0–36.0)
MCV: 96.1 fL (ref 80.0–100.0)
Monocytes Absolute: 0.9 10*3/uL (ref 0.1–1.0)
Monocytes Relative: 10 %
Neutro Abs: 6.2 10*3/uL (ref 1.7–7.7)
Neutrophils Relative %: 72 %
Platelets: 228 10*3/uL (ref 150–400)
RBC: 4.4 MIL/uL (ref 4.22–5.81)
RDW: 13.9 % (ref 11.5–15.5)
WBC: 8.5 10*3/uL (ref 4.0–10.5)
nRBC: 0 % (ref 0.0–0.2)

## 2023-01-03 LAB — COMPREHENSIVE METABOLIC PANEL
ALT: 12 U/L (ref 0–44)
AST: 12 U/L — ABNORMAL LOW (ref 15–41)
Albumin: 3.7 g/dL (ref 3.5–5.0)
Alkaline Phosphatase: 50 U/L (ref 38–126)
Anion gap: 8 (ref 5–15)
BUN: 23 mg/dL (ref 8–23)
CO2: 27 mmol/L (ref 22–32)
Calcium: 8.5 mg/dL — ABNORMAL LOW (ref 8.9–10.3)
Chloride: 103 mmol/L (ref 98–111)
Creatinine, Ser: 0.92 mg/dL (ref 0.61–1.24)
GFR, Estimated: >60 mL/min (ref >60)
Glucose, Bld: 126 mg/dL — ABNORMAL HIGH (ref 70–99)
Potassium: 3.6 mmol/L (ref 3.5–5.1)
Sodium: 138 mmol/L (ref 135–145)
Total Bilirubin: 0.8 mg/dL (ref <1.2)
Total Protein: 7.4 g/dL (ref 6.5–8.1)

## 2023-01-03 LAB — MAGNESIUM: Magnesium: 2.2 mg/dL (ref 1.7–2.4)

## 2023-01-03 MED ORDER — SODIUM CHLORIDE 0.9% FLUSH
10.0000 mL | INTRAVENOUS | Status: DC | PRN
  Administered 2023-01-03: 10 mL via INTRAVENOUS

## 2023-01-03 MED ORDER — HEPARIN SOD (PORK) LOCK FLUSH 100 UNIT/ML IV SOLN
500.0000 [IU] | Freq: Once | INTRAVENOUS | Status: AC
  Administered 2023-01-03: 500 [IU] via INTRAVENOUS

## 2023-01-03 NOTE — Progress Notes (Signed)
[facility] [address], [place] [address]    Clinic Day:  01/03/2023  Referring physician: [facility]  Patient Care Team: E. Lopez, [facility] as PCP - General Wendall Stade, MD as PCP - Cardiology (Cardiology) Marinus Maw, MD as PCP - Electrophysiology (Cardiology) Therese Sarah, RN as Oncology Nurse Navigator (Medical Oncology) Doreatha Massed, MD as Medical Oncologist (Medical Oncology)   ASSESSMENT & PLAN:   Assessment: 1.  Stage II (T3 N0 M0) low rectal adenocarcinoma, MMR preserved: - 76-month history of intermittent rectal bleeding.  No weight loss. - Colonoscopy (04/26/2022): Large cauliflower mass palpated in the distal rectum on DRE.  Scattered diverticula in the descending colon.  5 mm polyp in the ascending colon.  In the rectum, exophytic semilunar neoplastic appearing process beginning at the anal verge and extending proximally about 6 cm. - Pathology (04/26/2022): Invasive moderately differentiated adenocarcinoma of the rectal mass.  MMR preserved. - CT CAP (05/01/2022): Nodular wall thickening along the rectum.  No intrinsic abnormal lymph nodes.  No liver lesion.  Fatty liver.  Nodule along the left side of the urinary bladder.  Small mass along the course of the distal left ureter.  Worrisome for multifocal TCC.  Small lung nodules measuring up to 4 mm. - MRI pelvis (05/03/2022): 6.4 cm left lateral low rectal tumor abutting the internal anal sphincter, T3c N0.  Distance from tumor to the internal anal sphincter is 0 cm.  Tumor measures 6.4 cm in length and up to 2.2 cm in thickness. - PET scan (05/11/2022): No evidence of metastatic disease. - Met with Dr. Maisie Fus on 05/16/2022: She is in agreement with TNT and has discussed APR with colostomy. - TNT: FOLFOX cycle 1 started on 06/07/2022. Completed cycle 8 of chemotherapy on 09/25/2022. -- Started chemo RT with Xeloda last week. 12/27/22   2.  Social/family history: -He  lives at home and is independent of ADLs and IADLs.  He is accompanied by his son today.  Worked at [employer] prior to retirement.  Also served in [place] but denied any exposure to agent orange.  Quit smoking 1 year ago.  Smoked 1 to 2 packs/day for the last 61 years.  Started at age 19. - No family history of malignancies.    Plan: 1.  Stage II (T3 cN0 M0) low rectal adenocarcinoma, MMR preserved: - Completed cycle 8 of chemotherapy on 09/25/2022. --Patient started Xeloda without starting radiation therapy due to some misunderstanding.  Patient now understands that it has to be taken only on the days of radiation treatment. - We talked about chemoradiation therapy with Xeloda 1500 mg twice daily Monday through Friday throughout the course of radiation.  Start on Wednesday when he starts treatment. - We discussed side effects including nausea/vomiting/diarrhea/rare chance of hand-foot skin reaction among others. - He will tentatively start radiation therapy after TURBT on 11/20/2022. - Labs grossly normal today with no evidence of mucositis or diarrhea. - RTC 1 week after starting chemoradiation therapy with repeat labs and toxicity assessment.   2.  Left urinary bladder mass and distal left ureteral mass: - In office cystoscopy by Dr. Ronne Binning on 10/02/2022: 1 cm right lateral wall tumor with surrounding erythema, improved since last cystoscopy. - TURBT on 11/16/2022 showed a benign lesion on pathology.  Path consistent with acute on chronic cystitis.   3.  A-flutter with RVR: - Heart rate is stable since ablation. - Continue Xarelto and amiodarone.  Continues to be on oxygen.  4. Gout: - Discontinued allopurinol due to interaction with Xeloda.  He has been on allopurinol for gout prevention for the past few years. - We will start him on febuxostat 40 mg daily.   The total time spent in the appointment was 20 minutes encounter with patients including review of chart and  various tests results, discussions about plan of care and coordination of care plan    All questions were answered. The patient knows to call the clinic with any problems, questions or concerns. No barriers to learning was detected.   Cindie Crumbly, MD   11/27/20241:55 PM  CHIEF COMPLAINT:   Diagnosis: Rectal adenocarcinoma    Cancer Staging  Rectal adenocarcinoma [facility]) Staging form: Colon and Rectum, AJCC 8th Edition - Clinical stage from 05/07/2022: Stage IIA (cT3, cN0, cM0) - Unsigned    Prior Therapy: FOLFOX, 8 cycles, 06/07/22 - 09/25/22  Current Therapy:  Concurrent chemoradiation with Xeloda, to be followed by surgery    HISTORY OF PRESENT ILLNESS:   Oncology History  Rectal adenocarcinoma (HCC)  05/07/2022 Initial Diagnosis   Rectal adenocarcinoma (HCC)   06/07/2022 -  Chemotherapy   Patient is on Treatment Plan : COLORECTAL FOLFOX q14d x 4 months        INTERVAL HISTORY:   James Miles is a 77 y.o. male presenting to clinic today for follow up of Rectal adenocarcinoma.  He is in a wheelchair and is accompanied by his son today.  Patient started radiation last week and has had no complications from it.  He is currently taking Xeloda.  Reports no mucositis, fatigue, nausea, vomiting, diarrhea.  Overall doing well and is feeling better.  Patient stated that his appetite is 100% and energy levels are at 80%.   PAST MEDICAL HISTORY:   Past Medical History: Past Medical History:  Diagnosis Date   Back pain    COPD (chronic obstructive pulmonary disease) (HCC)    Diabetes mellitus without complication (HCC)    Dyspnea    Dysrhythmia    Hyperlipidemia    Oxygen dependent    4L when needed    Surgical History: Past Surgical History:  Procedure Laterality Date   A-FLUTTER ABLATION N/A 08/23/2022   Procedure: A-FLUTTER ABLATION;  Surgeon: Marinus Maw, MD;  Location: [facility];  Service: Cardiovascular;  Laterality: N/A;   BIOPSY  04/26/2022   Procedure:  BIOPSY;  Surgeon: Corbin Ade, MD;  Location: [facility] ENDO SUITE;  Service: Endoscopy;;   BLADDER INSTILLATION N/A 11/16/2022   Procedure: BLADDER INSTILLATION- Gemcitibine;  Surgeon: Malen Gauze, MD;  Location: [facility] ORS;  Service: Urology;  Laterality: N/A;   COLONOSCOPY WITH PROPOFOL N/A 04/26/2022   Procedure: COLONOSCOPY WITH PROPOFOL;  Surgeon: Corbin Ade, MD;  Location: [facility] ENDO [facility]ITE;  Service: Endoscopy;  Laterality: N/A;  10:00am; ASA 3   CYSTOSCOPY N/A 11/16/2022   Procedure: CYSTOSCOPY;  Surgeon: Malen Gauze, MD;  Location: [facility] ORS;  Service: Urology;  Laterality: N/A;   hemorhoidectomy     IR IMAGING GUIDED PORT INSERTION  05/12/2022   POLYPECTOMY  04/26/2022   Procedure: POLYPECTOMY;  Surgeon: Corbin Ade, MD;  Location: [facility];  Service: Endoscopy;;   TRANSURETHRAL RESECTION OF BLADDER TUMOR N/A 11/16/2022   Procedure: TRANSURETHRAL RESECTION OF BLADDER TUMOR (TURBT);  Surgeon: Malen Gauze, MD;  Location: [facility] ORS;  Service: Urology;  Laterality: N/A;    Social History: Social History   Socioeconomic History   Marital  status: Widowed    Spouse name: Not on file   Number of children: 2   Years of education: Not on file   Highest education level: Not on file  Occupational History   Not on file  Tobacco Use   Smoking status: Former    Current packs/day: 0.00    Types: Cigarettes    Start date: 08/1966    Quit date: 08/2016    Years since quitting: 6.4   Smokeless tobacco: Never  Vaping Use   Vaping status: Never Used  Substance and Sexual Activity   Alcohol use: Never   Drug use: Never   Sexual activity: Not Currently  Other Topics Concern   Not on file  Social History Narrative   Not on file   Social Determinants of Health   Financial Resource Strain: Not on file  Food Insecurity: No Food Insecurity (07/07/2022)   Hunger Vital Sign    Worried About Running Out of Food in the Last Year: Never true    Ran Out of Food in the Last  Year: Never true  Transportation Needs: No Transportation Needs (07/07/2022)   PRAPARE - Administrator, Civil Service (Medical): No    Lack of Transportation (Non-Medical): No  Physical Activity: Not on file  Stress: Not on file  Social Connections: Not on file  Intimate Partner Violence: Not At Risk (07/07/2022)   Humiliation, Afraid, Rape, and Kick questionnaire    Fear of Current or Ex-Partner: No    Emotionally Abused: No    Physically Abused: No    Sexually Abused: No    Family History: No family history on file.  Current Medications:  Current Outpatient Medications:    acidophilus (RISAQUAD) CAPS capsule, Take 1 capsule by mouth daily., Disp: , Rfl:    albuterol (VENTOLIN HFA) 108 (90 Base) MCG/ACT inhaler, Inhale 2 puffs into the lungs every 6 (six) hours as needed for wheezing or shortness of breath., Disp: , Rfl:    atorvastatin (LIPITOR) 20 MG tablet, Take 20 mg by mouth daily., Disp: , Rfl:    capecitabine (XELODA) 500 MG tablet, Take 3 tablets (1,500 mg total) by mouth 2 (two) times daily after a meal. Take Monday-Friday. Take only on days of radiation., Disp: 90 tablet, Rfl: 0   ciprofloxacin (CIPRO) 500 MG tablet, Take 1 tablet (500 mg total) by mouth 2 (two) times daily., Disp: 14 tablet, Rfl: 0   dextrose 5 % SOLN 1,000 mL with fluorouracil 5 GM/100ML SOLN, Inject into the vein., Disp: , Rfl:    empagliflozin (JARDIANCE) 25 MG TABS tablet, Take 25 mg by mouth daily., Disp: , Rfl:    febuxostat (ULORIC) 40 MG tablet, Take 1 tablet (40 mg total) by mouth daily., Disp: 90 tablet, Rfl: 1   fenofibrate 54 MG tablet, Take 54 mg by mouth daily., Disp: , Rfl:    HYDROcodone-acetaminophen (NORCO) 5-325 MG tablet, Take 1 tablet by mouth every 6 (six) hours as needed for moderate pain., Disp: 15 tablet, Rfl: 0   LEUCOVORIN CALCIUM IV, Inject into the vein., Disp: , Rfl:    lidocaine-prilocaine (EMLA) cream, Apply 1 Application topically as needed., Disp: 30 g, Rfl: 0    loperamide (IMODIUM) 2 MG capsule, Take 2 mg by mouth 3 (three) times daily as needed for diarrhea or loose stools., Disp: , Rfl:    MELATONIN PO, Take 1 tablet by mouth at bedtime as needed (sleep)., Disp: , Rfl:    Misc. Devices MISC, Please provide  with shower chair, Disp: 1 Units, Rfl: 0   nitrofurantoin, macrocrystal-monohydrate, (MACROBID) 100 MG capsule, Take 1 capsule (100 mg total) by mouth every 12 (twelve) hours., Disp: 14 capsule, Rfl: 0   OXALIPLATIN IV, Inject into the vein., Disp: , Rfl:    prochlorperazine (COMPAZINE) 10 MG tablet, Take 1 tablet (10 mg total) by mouth every 6 (six) hours as needed for nausea or vomiting., Disp: 30 tablet, Rfl: 2   sulfamethoxazole-trimethoprim (BACTRIM DS) 800-160 MG tablet, Take 1 tablet by mouth 2 (two) times daily., Disp: 42 tablet, Rfl: 0  Current Facility-Administered Medications:    ciprofloxacin (CIPRO) tablet 500 mg, 500 mg, Oral, Once, McKenzie, Mardene Celeste, MD   ciprofloxacin (CIPRO) tablet 500 mg, 500 mg, Oral, Once, McKenzie, Mardene Celeste, MD   Allergies: No Known Allergies  REVIEW OF SYSTEMS:   Review of Systems  Constitutional:  Negative for chills, fatigue and fever.  HENT:   Negative for lump/mass, mouth sores, nosebleeds, sore throat and trouble swallowing.   Eyes:  Negative for eye problems.  Respiratory:  Negative for cough and shortness of breath.   Cardiovascular:  Positive for leg swelling. Negative for chest pain and palpitations.  Gastrointestinal:  Negative for abdominal pain, constipation, diarrhea, nausea and vomiting.  Genitourinary:  Negative for bladder incontinence, difficulty urinating, dysuria, frequency, hematuria and nocturia.   Musculoskeletal:  Negative for arthralgias, back pain, flank pain, myalgias and neck pain.  Skin:  Negative for itching and rash.  Neurological:  Negative for dizziness, headaches and numbness.  Hematological:  Does not bruise/bleed easily.  Psychiatric/Behavioral:  Negative for  depression, sleep disturbance and suicidal ideas. The patient is not nervous/anxious.   All other systems reviewed and are negative.    VITALS:   There were no vitals taken for this visit.  Wt Readings from Last 3 Encounters:  12/25/22 173 lb 14.4 oz (78.9 kg)  11/13/22 175 lb (79.4 kg)  11/06/22 178 lb (80.7 kg)    There is no height or weight on file to calculate BMI.  Performance status (ECOG): 1 - Symptomatic but completely ambulatory  PHYSICAL EXAM:   GENERAL:alert, no distress and comfortable SKIN: skin color, texture, turgor are normal, no rashes or significant lesions LUNGS: clear to auscultation and percussion with normal breathing effort HEART: regular rate & rhythm and no murmurs and B/L pitting lower extremity edema ABDOMEN:abdomen soft, non-tender and normal bowel sounds Musculoskeletal:no cyanosis of digits and no clubbing  PSYCH: alert & oriented x 3 with fluent speech NEURO: no focal motor/sensory deficits   LABS:      Latest Ref Rng & Units 01/03/2023    7:51 AM 12/25/2022    8:56 AM 11/06/2022   10:44 AM  CBC  WBC 4.0 - 10.5 K/uL 8.5  9.2  7.8   Hemoglobin 13.0 - 17.0 g/dL 81.1  91.4  78.2   Hematocrit 39.0 - 52.0 % 42.3  45.4  41.1   Platelets 150 - 400 K/uL 228  257  214       Latest Ref Rng & Units 01/03/2023    7:51 AM 12/25/2022    8:56 AM 11/06/2022   10:44 AM  CMP  Glucose 70 - 99 mg/dL 956  213  086   BUN 8 - 23 mg/dL 23  22  11    Creatinine 0.61 - 1.24 mg/dL 5.78  4.69  6.29   Sodium 135 - 145 mmol/L 138  136  140   Potassium 3.5 - 5.1 mmol/L 3.6  4.2  3.8   Chloride 98 - 111 mmol/L 103  102  103   CO2 22 - 32 mmol/L 27  26  25    Calcium 8.9 - 10.3 mg/dL 8.5  8.7  8.8   Total Protein 6.5 - 8.1 g/dL 7.4  7.5  7.1   Total Bilirubin <1.2 mg/dL 0.8  0.6  0.4   Alkaline Phos 38 - 126 U/L 50  56  63   AST 15 - 41 U/L 12  16  16    ALT 0 - 44 U/L 12  19  15       Lab Results  Component Value Date   CEA1 2.6 04/26/2022   /  CEA  Date  Value Ref Range Status  04/26/2022 2.6 0.0 - 4.7 ng/mL Final    Comment:    (NOTE)                             Nonsmokers          <3.9                             Smokers             <5.6 [employer] Electrochemiluminescence Immunoassay (ECLIA) Values obtained with different assay methods or kits cannot be used interchangeably.  Results cannot be interpreted as absolute evidence of the presence or absence of malignant disease. Performed At: [facility] [address] [place], [place] [id number] Jolene Schimke MD ON:[id number]

## 2023-01-03 NOTE — Progress Notes (Signed)
[name] presented for Portacath access and flush with labs. Portacath located right chest wall accessed with  H 20 needle.  Good blood return present. Portacath flushed with 20ml NS and 500U/4ml Heparin and needle removed intact.  Procedure tolerated well and without incident. No swelling or bruising noted at the site.  Discharged from clinic via wheelchair in stable condition. Alert and oriented x 3. F/U with [facility] as scheduled.

## 2023-01-09 NOTE — Progress Notes (Signed)
[facility] [address], [place] [address]    Clinic Day:  01/11/2023  Referring physician: Ponciano Ort [name]  Patient Care Team: [place] as PCP - General Wendall Stade, MD as PCP - Cardiology (Cardiology) Marinus Maw, MD as PCP - Electrophysiology (Cardiology) Therese Sarah, RN as Oncology Nurse Navigator (Medical Oncology) Doreatha Massed, MD as Medical Oncologist (Medical Oncology)   ASSESSMENT & PLAN:   Assessment: 1.  Stage II (T3 N0 M0) low rectal adenocarcinoma, MMR preserved: - 65-month history of intermittent rectal bleeding.  No weight loss. - Colonoscopy (04/26/2022): Large cauliflower mass palpated in the distal rectum on DRE.  Scattered diverticula in the descending colon.  5 mm polyp in the ascending colon.  In the rectum, exophytic semilunar neoplastic appearing process beginning at the anal verge and extending proximally about 6 cm. - Pathology (04/26/2022): Invasive moderately differentiated adenocarcinoma of the rectal mass.  MMR preserved. - CT CAP (05/01/2022): Nodular wall thickening along the rectum.  No intrinsic abnormal lymph nodes.  No liver lesion.  Fatty liver.  Nodule along the left side of the urinary bladder.  Small mass along the course of the distal left ureter.  Worrisome for multifocal TCC.  Small lung nodules measuring up to 4 mm. - MRI pelvis (05/03/2022): 6.4 cm left lateral low rectal tumor abutting the internal anal sphincter, T3c N0.  Distance from tumor to the internal anal sphincter is 0 cm.  Tumor measures 6.4 cm in length and up to 2.2 cm in thickness. - PET scan (05/11/2022): No evidence of metastatic disease. - Met with Dr. Maisie Fus on 05/16/2022: She is in agreement with TNT and has discussed APR with colostomy. - TNT: 8 cycles of FOLFOX from 06/07/2022 through 09/25/2022. - Chemoradiation therapy with Xeloda 1500 mg twice daily started on 12/28/2022   2.  Social/family history: -He lives  at home and is independent of ADLs and IADLs.  He is accompanied by his son today.  Worked at [employer] prior to retirement.  Also served in [place] but denied any exposure to agent orange.  Quit smoking 1 year ago.  Smoked 1 to 2 packs/day for the last 61 years.  Started at age 89. - No family history of malignancies.    Plan: 1.  Stage II (T3 cN0 M0) low rectal adenocarcinoma, MMR preserved: - He was started on chemoradiation therapy on 12/28/2022. - He reportedly took Xeloda for 2 weeks even prior to starting radiation therapy.  His son Arlys John is upset about it today. - He is tolerating Xeloda 3 tablets twice daily very well.  He takes it at 8 AM and 8 PM.  No mucositis.  No hand-foot skin reaction.  Mild erythema in the palms. - Labs today: Normal LFTs and creatinine.  CBC grossly normal. - Continue Xeloda 3 tablets twice daily Monday through Friday.  His blood counts are staying stable.  RTC 2 weeks for follow-up.  He was instructed to call us should he develop any side effects.   2.  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   3.  A-flutter with RVR: - Rate is well-controlled since ablation.    No orders of the defined types were placed in this encounter.     [name],acting as a Neurosurgeon for Doreatha Massed, MD.,have documented all relevant documentation on  the behalf of Doreatha Massed, MD,as directed by  Doreatha Massed, MD while in the presence of Doreatha Massed, MD.  I, Doreatha Massed MD, have reviewed the above documentation for accuracy and completeness, and I agree with the above.    Doreatha Massed, MD   12/5/20245:13 PM  CHIEF COMPLAINT:   Diagnosis: Rectal adenocarcinoma    Cancer Staging  Rectal adenocarcinoma [facility]) Staging form: Colon and Rectum, AJCC 8th Edition - Clinical stage  from 05/07/2022: Stage IIA (cT3, cN0, cM0) - Unsigned    Prior Therapy: FOLFOX, 8 cycles, 06/07/22 - 09/25/22  Current Therapy:  Concurrent chemoradiation with Xeloda, to be followed by surgery    HISTORY OF PRESENT ILLNESS:   Oncology History  Rectal adenocarcinoma (HCC)  05/07/2022 Initial Diagnosis   Rectal adenocarcinoma (HCC)   06/07/2022 -  Chemotherapy   Patient is on Treatment Plan : COLORECTAL FOLFOX q14d x 4 months        INTERVAL HISTORY:   James Miles is a 77 y.o. male presenting to clinic today for follow up of Rectal adenocarcinoma. He was last seen by me on 11/06/22. He was last seen by Dr. Anders Simmonds on 01/03/23.  Since his last visit, he underwent a cystoscopy and TURBT on 11/16/22 with Dr. Ronne Binning. Surgical pathology of bladder tumor from TURBT revealed: acute and chronic and follicular cystitis with florid cystitis cystica and cystitis cystica glandularis, negative for carcinoma.   Today, he states that he is doing well overall. His appetite level is at 85%. His energy level is at 50%. He is accompanied by his son.   He started XRT 2 weeks ago and began his third week of XRT this week. He denies any pain or skin breakdown in the radiated areas. He will finish XRT on 02/08/23. He is taking Xeloda as prescribed and denies any mouth sores, diarrhea, and pain or blistering on the soles or palms of the hands due to Xeloda. He denies any lightheadedness. He c/o urinary frequency.   PAST MEDICAL HISTORY:   Past Medical History: Past Medical History:  Diagnosis Date   Back pain    COPD (chronic obstructive pulmonary disease) (HCC)    Diabetes mellitus without complication (HCC)    Dyspnea    Dysrhythmia    Hyperlipidemia    Oxygen dependent    4L when needed    Surgical History: Past Surgical History:  Procedure Laterality Date   A-FLUTTER ABLATION N/A 08/23/2022   Procedure: A-FLUTTER ABLATION;  Surgeon: Marinus Maw, MD;  Location: [facility];  Service:  Cardiovascular;  Laterality: N/A;   BIOPSY  04/26/2022   Procedure: BIOPSY;  Surgeon: Corbin Ade, MD;  Location: [facility] ENDO SUITE;  Service: Endoscopy;;   BLADDER INSTILLATION N/A 11/16/2022   Procedure: BLADDER INSTILLATION- Gemcitibine;  Surgeon: Malen Gauze, MD;  Location: [facility] ORS;  Service: Urology;  Laterality: N/A;   COLONOSCOPY WITH PROPOFOL N/A 04/26/2022   Procedure: COLONOSCOPY WITH PROPOFOL;  Surgeon: Corbin Ade, MD;  Location: [facility] END[facility];  Service: Endoscopy;  Laterality: N/A;  10:00am; ASA 3   CYSTOSCOPY N/A 11/16/2022   Procedure: CYSTOSCOPY;  Surgeon: Malen Gauze, MD;  Location: [facility] ORS;  Service: Urology;  Laterality: N/A;   hemorhoidectomy     IR IMAGING GUIDED PORT INSERTION  05/12/2022   POLYPECTOMY  04/26/2022   Procedure: POLYPECTOMY;  Surgeon: Corbin Ade, MD;  Location: [facility];  Service: Endoscopy;;   TRANSURETHRAL RESECTION OF BLADDER TUMOR N/A 11/16/2022   Procedure:  TRANSURETHRAL RESECTION OF BLADDER TUMOR (TURBT);  Surgeon: Malen Gauze, MD;  Location: [facility] ORS;  Service: Urology;  Laterality: N/A;    Social History: Social History   Socioeconomic History   Marital status: Widowed    Spouse name: Not on file   Number of children: 2   Years of education: Not on file   Highest education level: Not on file  Occupational History   Not on file  Tobacco Use   Smoking status: Former    Current packs/day: 0.00    Types: Cigarettes    Start date: 08/1966    Quit date: 08/2016    Years since quitting: 6.4   Smokeless tobacco: Never  Vaping Use   Vaping status: Never Used  Substance and Sexual Activity   Alcohol use: Never   Drug use: Never   Sexual activity: Not Currently  Other Topics Concern   Not on file  Social History Narrative   Not on file   Social Determinants of Health   Financial Resource Strain: Not on file  Food Insecurity: No Food Insecurity (07/07/2022)   Hunger Vital Sign    Worried About Running Out  of Food in the Last Year: Never true    Ran Out of Food in the Last Year: Never true  Transportation Needs: No Transportation Needs (07/07/2022)   PRAPARE - Administrator, Civil Service (Medical): No    Lack of Transportation (Non-Medical): No  Physical Activity: Not on file  Stress: Not on file  Social Connections: Not on file  Intimate Partner Violence: Not At Risk (07/07/2022)   Humiliation, Afraid, Rape, and Kick questionnaire    Fear of Current or Ex-Partner: No    Emotionally Abused: No    Physically Abused: No    Sexually Abused: No    Family History: No family history on file.  Current Medications:  Current Outpatient Medications:    acidophilus (RISAQUAD) CAPS capsule, Take 1 capsule by mouth daily., Disp: , Rfl:    albuterol (VENTOLIN HFA) 108 (90 Base) MCG/ACT inhaler, Inhale 2 puffs into the lungs every 6 (six) hours as needed for wheezing or shortness of breath., Disp: , Rfl:    atorvastatin (LIPITOR) 20 MG tablet, Take 20 mg by mouth daily., Disp: , Rfl:    capecitabine (XELODA) 500 MG tablet, Take 3 tablets (1,500 mg total) by mouth 2 (two) times daily after a meal. Take Monday-Friday. Take only on days of radiation., Disp: 90 tablet, Rfl: 0   ciprofloxacin (CIPRO) 500 MG tablet, Take 1 tablet (500 mg total) by mouth 2 (two) times daily., Disp: 14 tablet, Rfl: 0   dextrose 5 % SOLN 1,000 mL with fluorouracil 5 GM/100ML SOLN, Inject into the vein., Disp: , Rfl:    empagliflozin (JARDIANCE) 25 MG TABS tablet, Take 25 mg by mouth daily., Disp: , Rfl:    febuxostat (ULORIC) 40 MG tablet, Take 1 tablet (40 mg total) by mouth daily., Disp: 90 tablet, Rfl: 1   fenofibrate 54 MG tablet, Take 54 mg by mouth daily., Disp: , Rfl:    HYDROcodone-acetaminophen (NORCO) 5-325 MG tablet, Take 1 tablet by mouth every 6 (six) hours as needed for moderate pain., Disp: 15 tablet, Rfl: 0   LEUCOVORIN CALCIUM IV, Inject into the vein., Disp: , Rfl:    lidocaine-prilocaine (EMLA)  cream, Apply 1 Application topically as needed., Disp: 30 g, Rfl: 0   loperamide (IMODIUM) 2 MG capsule, Take 2 mg by mouth 3 (three) times daily  as needed for diarrhea or loose stools., Disp: , Rfl:    MELATONIN PO, Take 1 tablet by mouth at bedtime as needed (sleep)., Disp: , Rfl:    Misc. Devices MISC, Please provide with shower chair, Disp: 1 Units, Rfl: 0   nitrofurantoin, macrocrystal-monohydrate, (MACROBID) 100 MG capsule, Take 1 capsule (100 mg total) by mouth every 12 (twelve) hours., Disp: 14 capsule, Rfl: 0   OXALIPLATIN IV, Inject into the vein., Disp: , Rfl:    prochlorperazine (COMPAZINE) 10 MG tablet, Take 1 tablet (10 mg total) by mouth every 6 (six) hours as needed for nausea or vomiting., Disp: 30 tablet, Rfl: 2   sulfamethoxazole-trimethoprim (BACTRIM DS) 800-160 MG tablet, Take 1 tablet by mouth 2 (two) times daily., Disp: 42 tablet, Rfl: 0  Current Facility-Administered Medications:    ciprofloxacin (CIPRO) tablet 500 mg, 500 mg, Oral, Once, McKenzie, Mardene Celeste, MD   ciprofloxacin (CIPRO) tablet 500 mg, 500 mg, Oral, Once, McKenzie, Mardene Celeste, MD   Allergies: No Known Allergies  REVIEW OF SYSTEMS:   Review of Systems  Constitutional:  Negative for chills, fatigue and fever.  HENT:   Negative for lump/mass, mouth sores, nosebleeds, sore throat and trouble swallowing.   Eyes:  Negative for eye problems.  Respiratory:  Negative for cough and shortness of breath.   Cardiovascular:  Negative for chest pain, leg swelling and palpitations.  Gastrointestinal:  Positive for diarrhea. Negative for abdominal pain, constipation, nausea and vomiting.  Genitourinary:  Negative for bladder incontinence, difficulty urinating, dysuria, frequency, hematuria and nocturia.   Musculoskeletal:  Negative for arthralgias, back pain, flank pain, myalgias and neck pain.  Skin:  Negative for itching and rash.  Neurological:  Negative for dizziness, headaches and numbness.  Hematological:  Does  not bruise/bleed easily.  Psychiatric/Behavioral:  Negative for depression, sleep disturbance and suicidal ideas. The patient is not nervous/anxious.   All other systems reviewed and are negative.    VITALS:   There were no vitals taken for this visit.  Wt Readings from Last 3 Encounters:  12/25/22 173 lb 14.4 oz (78.9 kg)  11/13/22 175 lb (79.4 kg)  11/06/22 178 lb (80.7 kg)    There is no height or weight on file to calculate BMI.  Performance status (ECOG): 1 - Symptomatic but completely ambulatory  PHYSICAL EXAM:   Physical Exam Vitals and nursing note reviewed. Exam conducted with a chaperone present.  Constitutional:      Appearance: Normal appearance.  Cardiovascular:     Rate and Rhythm: Normal rate and regular rhythm.     Pulses: Normal pulses.     Heart sounds: Normal heart sounds.  Pulmonary:     Effort: Pulmonary effort is normal.     Breath sounds: Normal breath sounds.  Abdominal:     Palpations: Abdomen is soft. There is no hepatomegaly, splenomegaly or mass.     Tenderness: There is no abdominal tenderness.  Musculoskeletal:     Right lower leg: No edema.     Left lower leg: No edema.  Lymphadenopathy:     Cervical: No cervical adenopathy.     Right cervical: No superficial, deep or posterior cervical adenopathy.    Left cervical: No superficial, deep or posterior cervical adenopathy.     Upper Body:     Right upper body: No supraclavicular or axillary adenopathy.     Left upper body: No supraclavicular or axillary adenopathy.  Neurological:     General: No focal deficit present.  Mental Status: He is alert and oriented to person, place, and time.  Psychiatric:        Mood and Affect: Mood normal.        Behavior: Behavior normal.     LABS:      Latest Ref Rng & Units 01/11/2023   12:41 PM 01/03/2023    7:51 AM 12/25/2022    8:56 AM  CBC  WBC 4.0 - 10.5 K/uL 6.3  8.5  9.2   Hemoglobin 13.0 - 17.0 g/dL 96.0  45.4  09.8   Hematocrit 39.0 -  52.0 % 43.6  42.3  45.4   Platelets 150 - 400 K/uL 237  228  257       Latest Ref Rng & Units 01/11/2023   12:41 PM 01/03/2023    7:51 AM 12/25/2022    8:56 AM  CMP  Glucose 70 - 99 mg/dL 119  147  829   BUN 8 - 23 mg/dL 14  23  22    Creatinine 0.61 - 1.24 mg/dL 5.62  1.30  8.65   Sodium 135 - 145 mmol/L 143  138  136   Potassium 3.5 - 5.1 mmol/L 4.0  3.6  4.2   Chloride 98 - 111 mmol/L 103  103  102   CO2 22 - 32 mmol/L 31  27  26    Calcium 8.9 - 10.3 mg/dL 9.3  8.5  8.7   Total Protein 6.5 - 8.1 g/dL 7.3  7.4  7.5   Total Bilirubin <1.2 mg/dL 0.5  0.8  0.6   Alkaline Phos 38 - 126 U/L 52  50  56   AST 15 - 41 U/L 13  12  16    ALT 0 - 44 U/L 14  12  19       Lab Results  Component Value Date   CEA1 2.6 04/26/2022   /  CEA  Date Value Ref Range Status  04/26/2022 2.6 0.0 - 4.7 ng/mL Final    Comment:    (NOTE)                             Nonsmokers          <3.9                             Smokers             <5.6 Roche [employer] Electrochemiluminescence Immunoassay (ECLIA) Values obtained with different assay methods or kits cannot be used interchangeably.  Results cannot be interpreted as absolute evidence of the presence or absence of malignant disease. Performed At: [facility] [address] [place], [place] [id number] Jolene Schimke MD MW:[id number]    No results found for: "PSA1" No results found for: "306 014 [id number]" No results found for: "CAN125"  No results found for: "TOTALPROTELP", "ALBUMINELP", "A1GS", "A2GS", "BETS", "BETA2SER", "GAMS", "MSPIKE", "SPEI" No results found for: "TIBC", "FERRITIN", "IRONPCTSAT" No results found for: "LDH"   STUDIES:   No results found.

## 2023-01-11 ENCOUNTER — Inpatient Hospital Stay: Payer: PPO | Attending: Hematology | Admitting: Hematology

## 2023-01-11 ENCOUNTER — Inpatient Hospital Stay: Payer: PPO

## 2023-01-11 DIAGNOSIS — C20 Malignant neoplasm of rectum: Secondary | ICD-10-CM | POA: Diagnosis not present

## 2023-01-11 DIAGNOSIS — Z452 Encounter for adjustment and management of vascular access device: Secondary | ICD-10-CM | POA: Diagnosis not present

## 2023-01-11 DIAGNOSIS — Z87891 Personal history of nicotine dependence: Secondary | ICD-10-CM | POA: Diagnosis not present

## 2023-01-11 DIAGNOSIS — I4892 Unspecified atrial flutter: Secondary | ICD-10-CM | POA: Diagnosis not present

## 2023-01-11 DIAGNOSIS — Z79899 Other long term (current) drug therapy: Secondary | ICD-10-CM | POA: Insufficient documentation

## 2023-01-11 DIAGNOSIS — D4122 Neoplasm of uncertain behavior of left ureter: Secondary | ICD-10-CM | POA: Insufficient documentation

## 2023-01-11 DIAGNOSIS — Z7901 Long term (current) use of anticoagulants: Secondary | ICD-10-CM | POA: Insufficient documentation

## 2023-01-11 DIAGNOSIS — N329 Bladder disorder, unspecified: Secondary | ICD-10-CM | POA: Diagnosis not present

## 2023-01-11 LAB — CBC WITH DIFFERENTIAL/PLATELET
Abs Immature Granulocytes: 0.02 10*3/uL (ref 0.00–0.07)
Basophils Absolute: 0 10*3/uL (ref 0.0–0.1)
Basophils Relative: 0 %
Eosinophils Absolute: 0.1 10*3/uL (ref 0.0–0.5)
Eosinophils Relative: 2 %
HCT: 43.6 % (ref 39.0–52.0)
Hemoglobin: 13.3 g/dL (ref 13.0–17.0)
Immature Granulocytes: 0 %
Lymphocytes Relative: 15 %
Lymphs Abs: 0.9 10*3/uL (ref 0.7–4.0)
MCH: 30 pg (ref 26.0–34.0)
MCHC: 30.5 g/dL (ref 30.0–36.0)
MCV: 98.2 fL (ref 80.0–100.0)
Monocytes Absolute: 0.5 10*3/uL (ref 0.1–1.0)
Monocytes Relative: 8 %
Neutro Abs: 4.7 10*3/uL (ref 1.7–7.7)
Neutrophils Relative %: 75 %
Platelets: 237 10*3/uL (ref 150–400)
RBC: 4.44 MIL/uL (ref 4.22–5.81)
RDW: 13.6 % (ref 11.5–15.5)
WBC: 6.3 10*3/uL (ref 4.0–10.5)
nRBC: 0 % (ref 0.0–0.2)

## 2023-01-11 LAB — COMPREHENSIVE METABOLIC PANEL
ALT: 14 U/L (ref 0–44)
AST: 13 U/L — ABNORMAL LOW (ref 15–41)
Albumin: 3.5 g/dL (ref 3.5–5.0)
Alkaline Phosphatase: 52 U/L (ref 38–126)
Anion gap: 9 (ref 5–15)
BUN: 14 mg/dL (ref 8–23)
CO2: 31 mmol/L (ref 22–32)
Calcium: 9.3 mg/dL (ref 8.9–10.3)
Chloride: 103 mmol/L (ref 98–111)
Creatinine, Ser: 0.77 mg/dL (ref 0.61–1.24)
GFR, Estimated: >60 mL/min (ref >60)
Glucose, Bld: 128 mg/dL — ABNORMAL HIGH (ref 70–99)
Potassium: 4 mmol/L (ref 3.5–5.1)
Sodium: 143 mmol/L (ref 135–145)
Total Bilirubin: 0.5 mg/dL (ref <1.2)
Total Protein: 7.3 g/dL (ref 6.5–8.1)

## 2023-01-11 LAB — MAGNESIUM: Magnesium: 2.2 mg/dL (ref 1.7–2.4)

## 2023-01-11 MED ORDER — HEPARIN SOD (PORK) LOCK FLUSH 100 UNIT/ML IV SOLN
500.0000 [IU] | Freq: Once | INTRAVENOUS | Status: AC
  Administered 2023-01-11: 500 [IU] via INTRAVENOUS

## 2023-01-11 MED ORDER — SODIUM CHLORIDE 0.9% FLUSH
10.0000 mL | Freq: Once | INTRAVENOUS | Status: AC
  Administered 2023-01-11: 10 mL via INTRAVENOUS

## 2023-01-11 NOTE — Patient Instructions (Signed)
[facility] at [facility] Discharge Instructions   You were seen and examined today by Dr. Ellin Saba.  He reviewed the results of your lab work which are normal/stable.   Continue Xeloda twice a day as prescribed on the days of radiation therapy.   Return as scheduled.    Thank you for choosing [facility] at [facility] to provide your oncology and hematology care.  To afford each patient quality time with our provider, please arrive at least 15 minutes before your scheduled appointment time.   If you have a lab appointment with the Cancer Center please come in thru the Main Entrance and check in at the main information desk.  You need to re-schedule your appointment should you arrive 10 or more minutes late.  We strive to give you quality time with our providers, and arriving late affects you and other patients whose appointments are after yours.  Also, if you no show three or more times for appointments you may be dismissed from the clinic at the providers discretion.     Again, thank you for choosing [facility].  Our hope is that these requests will decrease the amount of time that you wait before being seen by our physicians.       _____________________________________________________________  Should you have questions after your visit to [facility], please contact our office at [phone] and follow the prompts.  Our office hours are 8:00 a.m. and 4:30 p.m. Monday - Friday.  Please note that voicemails left after 4:00 p.m. may not be returned until the following business day.  We are closed weekends and major holidays.  You do have access to a nurse 24-7, just call the main number to the clinic [phone] and do not press any options, hold on the line and a nurse will answer the phone.    For prescription refill requests, have your pharmacy contact our office and allow 72 hours.    Due to Covid, you will  need to wear a mask upon entering the hospital. If you do not have a mask, a mask will be given to you at the Main Entrance upon arrival. For doctor visits, patients may have 1 support person age 80 or older with them. For treatment visits, patients can not have anyone with them due to social distancing guidelines and our immunocompromised population.

## 2023-01-11 NOTE — Progress Notes (Signed)
Patients port flushed without difficulty. Good blood return noted with no bruising or swelling noted at site. Band aid applied. VSS with discharge and left in satisfactory condition with no s/s of distress noted. All follow ups as scheduled.  Marquie Aderhold [employer]

## 2023-01-18 ENCOUNTER — Encounter: Payer: Self-pay | Admitting: Urology

## 2023-01-25 ENCOUNTER — Inpatient Hospital Stay: Payer: PPO | Admitting: Hematology

## 2023-01-25 ENCOUNTER — Inpatient Hospital Stay: Payer: PPO

## 2023-01-25 VITALS — BP 107/63 | HR 64 | Temp 96.7°F | Resp 20

## 2023-01-25 VITALS — Ht 71.0 in | Wt 167.8 lb

## 2023-01-25 DIAGNOSIS — C20 Malignant neoplasm of rectum: Secondary | ICD-10-CM | POA: Diagnosis not present

## 2023-01-25 DIAGNOSIS — Z95828 Presence of other vascular implants and grafts: Secondary | ICD-10-CM

## 2023-01-25 LAB — MAGNESIUM: Magnesium: 2 mg/dL (ref 1.7–2.4)

## 2023-01-25 LAB — CBC WITH DIFFERENTIAL/PLATELET
Abs Immature Granulocytes: 0.06 10*3/uL (ref 0.00–0.07)
Basophils Absolute: 0 10*3/uL (ref 0.0–0.1)
Basophils Relative: 0 %
Eosinophils Absolute: 0.1 10*3/uL (ref 0.0–0.5)
Eosinophils Relative: 1 %
HCT: 38.5 % — ABNORMAL LOW (ref 39.0–52.0)
Hemoglobin: 12.2 g/dL — ABNORMAL LOW (ref 13.0–17.0)
Immature Granulocytes: 1 %
Lymphocytes Relative: 11 %
Lymphs Abs: 0.6 10*3/uL — ABNORMAL LOW (ref 0.7–4.0)
MCH: 30.4 pg (ref 26.0–34.0)
MCHC: 31.7 g/dL (ref 30.0–36.0)
MCV: 96 fL (ref 80.0–100.0)
Monocytes Absolute: 0.5 10*3/uL (ref 0.1–1.0)
Monocytes Relative: 9 %
Neutro Abs: 4.7 10*3/uL (ref 1.7–7.7)
Neutrophils Relative %: 78 %
Platelets: 248 10*3/uL (ref 150–400)
RBC: 4.01 MIL/uL — ABNORMAL LOW (ref 4.22–5.81)
RDW: 14.1 % (ref 11.5–15.5)
WBC: 6 10*3/uL (ref 4.0–10.5)
nRBC: 0 % (ref 0.0–0.2)

## 2023-01-25 LAB — COMPREHENSIVE METABOLIC PANEL
ALT: 14 U/L (ref 0–44)
AST: 15 U/L (ref 15–41)
Albumin: 3.4 g/dL — ABNORMAL LOW (ref 3.5–5.0)
Alkaline Phosphatase: 56 U/L (ref 38–126)
Anion gap: 11 (ref 5–15)
BUN: 16 mg/dL (ref 8–23)
CO2: 29 mmol/L (ref 22–32)
Calcium: 8.8 mg/dL — ABNORMAL LOW (ref 8.9–10.3)
Chloride: 101 mmol/L (ref 98–111)
Creatinine, Ser: 0.86 mg/dL (ref 0.61–1.24)
GFR, Estimated: >60 mL/min (ref >60)
Glucose, Bld: 105 mg/dL — ABNORMAL HIGH (ref 70–99)
Potassium: 3.6 mmol/L (ref 3.5–5.1)
Sodium: 141 mmol/L (ref 135–145)
Total Bilirubin: 0.7 mg/dL (ref <1.2)
Total Protein: 7.1 g/dL (ref 6.5–8.1)

## 2023-01-25 MED ORDER — PHENAZOPYRIDINE HCL 100 MG PO TABS: 100.0000 mg | ORAL_TABLET | Freq: Three times a day (TID) | ORAL | 0 refills | Status: DC | PRN

## 2023-01-25 MED ORDER — SODIUM CHLORIDE 0.9% FLUSH
10.0000 mL | INTRAVENOUS | Status: DC | PRN
  Administered 2023-01-25: 10 mL via INTRAVENOUS

## 2023-01-25 MED ORDER — HEPARIN SOD (PORK) LOCK FLUSH 100 UNIT/ML IV SOLN
500.0000 [IU] | Freq: Once | INTRAVENOUS | Status: AC
  Administered 2023-01-25: 500 [IU] via INTRAVENOUS

## 2023-01-25 NOTE — Progress Notes (Signed)
[facility] [address] [place], [place] [address]    Clinic Day:  01/25/2023  Referring physician: [facility]  Patient Care Team: [place], [facility] as PCP - General Wendall Stade, MD as PCP - Cardiology (Cardiology) Marinus Maw, MD as PCP - Electrophysiology (Cardiology) Therese Sarah, RN as Oncology Nurse Navigator (Medical Oncology) Doreatha Massed, MD as Medical Oncologist (Medical Oncology)   ASSESSMENT & PLAN:   Assessment: 1.  Stage II (T3 N0 M0) low rectal adenocarcinoma, MMR preserved: - 8-month history of intermittent rectal bleeding.  No weight loss. - Colonoscopy (04/26/2022): Large cauliflower mass palpated in the distal rectum on DRE.  Scattered diverticula in the descending colon.  5 mm polyp in the ascending colon.  In the rectum, exophytic semilunar neoplastic appearing process beginning at the anal verge and extending proximally about 6 cm. - Pathology (04/26/2022): Invasive moderately differentiated adenocarcinoma of the rectal mass.  MMR preserved. - CT CAP (05/01/2022): Nodular wall thickening along the rectum.  No intrinsic abnormal lymph nodes.  No liver lesion.  Fatty liver.  Nodule along the left side of the urinary bladder.  Small mass along the course of the distal left ureter.  Worrisome for multifocal TCC.  Small lung nodules measuring up to 4 mm. - MRI pelvis (05/03/2022): 6.4 cm left lateral low rectal tumor abutting the internal anal sphincter, T3c N0.  Distance from tumor to the internal anal sphincter is 0 cm.  Tumor measures 6.4 cm in length and up to 2.2 cm in thickness. - PET scan (05/11/2022): No evidence of metastatic disease. - Met with Dr. Maisie Fus on 05/16/2022: She is in agreement with TNT and has discussed APR with colostomy. - TNT: 8 cycles of FOLFOX from 06/07/2022 through 09/25/2022. - Chemoradiation therapy with Xeloda 1500 mg twice daily started on 12/28/2022   2.  Social/family history: -He lives  at home and is independent of ADLs and IADLs.  He is accompanied by his son today.  Worked at [employer] prior to retirement.  Also served in [place] but denied any exposure to agent orange.  Quit smoking 1 year ago.  Smoked 1 to 2 packs/day for the last 61 years.  Started at age 35. - No family history of malignancies.    Plan: 1.  Stage II (T3 cN0 M0) low rectal adenocarcinoma, MMR preserved: - Chemoradiation therapy started on 12/28/2022. - He is continuing to take Xeloda 3 tablets twice daily on the days of radiation around 8 AM and 8 PM. - Physical exam: No hand-foot skin reaction.  Mild erythema in the palms.  No mucositis. - Reviewed labs today: LFTs and electrolytes are normal.  CBC grossly normal. - He had UTI on 03/19/2022 and ciprofloxacin was started.  He still has pain on urination. - Will prescribe Pyridium 100 mg 3 times daily. - Continue Xeloda 3 tablets twice daily.  Hold Xeloda on Christmas Day and New Year's Day. - RTC 2 weeks for follow-up with repeat labs.  Will set up pelvic MRI at that time.   2.  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   3.  A-flutter with RVR: - Rate well-controlled since ablation.    No orders of the defined types were placed in this encounter.     [name],acting as a Neurosurgeon for Doreatha Massed, MD.,have documented all relevant  documentation on the behalf of Doreatha Massed, MD,as directed by  Doreatha Massed, MD while in the presence of Doreatha Massed, MD.  I, Doreatha Massed MD, have reviewed the above documentation for accuracy and completeness, and I agree with the above.     Doreatha Massed, MD   12/19/20245:28 PM  CHIEF COMPLAINT:   Diagnosis: Rectal adenocarcinoma    Cancer Staging  Rectal adenocarcinoma [facility]) Staging form: Colon and Rectum, AJCC 8th  Edition - Clinical stage from 05/07/2022: Stage IIA (cT3, cN0, cM0) - Unsigned    Prior Therapy: FOLFOX, 8 cycles, 06/07/22 - 09/25/22  Current Therapy:  Concurrent chemoradiation with Xeloda, to be followed by surgery    HISTORY OF PRESENT ILLNESS:   Oncology History  Rectal adenocarcinoma (HCC)  05/07/2022 Initial Diagnosis   Rectal adenocarcinoma (HCC)   06/07/2022 -  Chemotherapy   Patient is on Treatment Plan : COLORECTAL FOLFOX q14d x 4 months        INTERVAL HISTORY:   James Miles is a 77 y.o. male presenting to clinic today for follow up of Rectal adenocarcinoma. He was last seen by me on 01/11/23.  Today, he states that he is doing well overall. His appetite level is at 50%. His energy level is at 25%.   He is taking Xeloda as prescribed. He denies any mouth sores, diarrhea, constipation, and pain or blistering on the palms and soles of the feet. He has a UTI and was put on Cipro by Dr. Langston Masker. He still has severe dysuria and he does not believe the antibiotics are effective. He has an appointment with Dr. Langston Masker tomorrow who will do urinalysis. He finishes XRT on 02/09/23.   PAST MEDICAL HISTORY:   Past Medical History: Past Medical History:  Diagnosis Date   Back pain    COPD (chronic obstructive pulmonary disease) (HCC)    Diabetes mellitus without complication (HCC)    Dyspnea    Dysrhythmia    Hyperlipidemia    Oxygen dependent    4L when needed    Surgical History: Past Surgical History:  Procedure Laterality Date   A-FLUTTER ABLATION N/A 08/23/2022   Procedure: A-FLUTTER ABLATION;  Surgeon: Marinus Maw, MD;  Location: [facility] INVASIVE [facility] LAB;  Service: Cardiovascular;  Laterality: N/A;   BIOPSY  04/26/2022   Procedure: BIOPSY;  Surgeon: Corbin Ade, MD;  Location: [facility] ENDO SUITE;  Service: Endoscopy;;   BLADDER INSTILLATION N/A 11/16/2022   Procedure: BLADDER INSTILLATION- Gemcitibine;  Surgeon: Malen Gauze, MD;  Location: [facility] ORS;  Service: Urology;   Laterality: N/A;   COLONOSCOPY WITH PROPOFOL N/A 04/26/2022   Procedure: COLONOSCOPY WITH PROPOFOL;  Surgeon: Corbin Ade, MD;  Location: [facility] ENDO SUITE;  Service: Endoscopy;  Laterality: N/A;  10:00am; ASA 3   CYSTOSCOPY N/A 11/16/2022   Procedure: CYSTOSCOPY;  Surgeon: Malen Gauze, MD;  Location: [facility] ORS;  Service: Urology;  Laterality: N/A;   hemorhoidectomy     IR IMAGING GUIDED PORT INSERTION  05/12/2022   POLYPECTOMY  04/26/2022   Procedure: POLYPECTOMY;  Surgeon: Corbin Ade, MD;  Location: [facility];  Service: Endoscopy;;   TRANSURETHRAL RESECTION OF BLADDER TUMOR N/A 11/16/2022   Procedure: TRANSURETHRAL RESECTION OF BLADDER TUMOR (TURBT);  Surgeon: Malen Gauze, MD;  Location: [facility] ORS;  Service: Urology;  Laterality: N/A;    Social History: Social History   Socioeconomic History   Marital status: Widowed    Spouse name: Not on file   Number of children: 2  Years of education: Not on file   Highest education level: Not on file  Occupational History   Not on file  Tobacco Use   Smoking status: Former    Current packs/day: 0.00    Types: Cigarettes    Start date: 08/1966    Quit date: 08/2016    Years since quitting: 6.4   Smokeless tobacco: Never  Vaping Use   Vaping status: Never Used  Substance and Sexual Activity   Alcohol use: Never   Drug use: Never   Sexual activity: Not Currently  Other Topics Concern   Not on file  Social History Narrative   Not on file   Social Drivers of Health   Financial Resource Strain: Not on file  Food Insecurity: No Food Insecurity (07/07/2022)   Hunger Vital Sign    Worried About Running Out of Food in the Last Year: Never true    Ran Out of Food in the Last Year: Never true  Transportation Needs: No Transportation Needs (07/07/2022)   PRAPARE - Administrator, Civil Service (Medical): No    Lack of Transportation (Non-Medical): No  Physical Activity: Not on file  Stress: Not on file   Social Connections: Not on file  Intimate Partner Violence: Not At Risk (07/07/2022)   Humiliation, Afraid, Rape, and Kick questionnaire    Fear of Current or Ex-Partner: No    Emotionally Abused: No    Physically Abused: No    Sexually Abused: No    Family History: No family history on file.  Current Medications:  Current Outpatient Medications:    acidophilus (RISAQUAD) CAPS capsule, Take 1 capsule by mouth daily., Disp: , Rfl:    albuterol (VENTOLIN HFA) 108 (90 Base) MCG/ACT inhaler, Inhale 2 puffs into the lungs every 6 (six) hours as needed for wheezing or shortness of breath., Disp: , Rfl:    atorvastatin (LIPITOR) 20 MG tablet, Take 20 mg by mouth daily., Disp: , Rfl:    capecitabine (XELODA) 500 MG tablet, Take 3 tablets (1,500 mg total) by mouth 2 (two) times daily after a meal. Take Monday-Friday. Take only on days of radiation., Disp: 90 tablet, Rfl: 0   ciprofloxacin (CIPRO) 500 MG tablet, Take 1 tablet (500 mg total) by mouth 2 (two) times daily., Disp: 14 tablet, Rfl: 0   dextrose 5 % SOLN 1,000 mL with fluorouracil 5 GM/100ML SOLN, Inject into the vein., Disp: , Rfl:    empagliflozin (JARDIANCE) 25 MG TABS tablet, Take 25 mg by mouth daily., Disp: , Rfl:    febuxostat (ULORIC) 40 MG tablet, Take 1 tablet (40 mg total) by mouth daily., Disp: 90 tablet, Rfl: 1   fenofibrate 54 MG tablet, Take 54 mg by mouth daily., Disp: , Rfl:    HYDROcodone-acetaminophen (NORCO) 5-325 MG tablet, Take 1 tablet by mouth every 6 (six) hours as needed for moderate pain., Disp: 15 tablet, Rfl: 0   LEUCOVORIN CALCIUM IV, Inject into the vein., Disp: , Rfl:    lidocaine-prilocaine (EMLA) cream, Apply 1 Application topically as needed., Disp: 30 g, Rfl: 0   loperamide (IMODIUM) 2 MG capsule, Take 2 mg by mouth 3 (three) times daily as needed for diarrhea or loose stools., Disp: , Rfl:    MELATONIN PO, Take 1 tablet by mouth at bedtime as needed (sleep)., Disp: , Rfl:    Misc. Devices MISC,  Please provide with shower chair, Disp: 1 Units, Rfl: 0   nitrofurantoin, macrocrystal-monohydrate, (MACROBID) 100 MG capsule, Take 1  capsule (100 mg total) by mouth every 12 (twelve) hours., Disp: 14 capsule, Rfl: 0   OXALIPLATIN IV, Inject into the vein., Disp: , Rfl:    phenazopyridine (PYRIDIUM) 100 MG tablet, Take 1 tablet (100 mg total) by mouth 3 (three) times daily as needed for pain., Disp: 12 tablet, Rfl: 0   prochlorperazine (COMPAZINE) 10 MG tablet, Take 1 tablet (10 mg total) by mouth every 6 (six) hours as needed for nausea or vomiting., Disp: 30 tablet, Rfl: 2   sulfamethoxazole-trimethoprim (BACTRIM DS) 800-160 MG tablet, Take 1 tablet by mouth 2 (two) times daily., Disp: 42 tablet, Rfl: 0  Current Facility-Administered Medications:    ciprofloxacin (CIPRO) tablet 500 mg, 500 mg, Oral, Once, McKenzie, Mardene Celeste, MD   ciprofloxacin (CIPRO) tablet 500 mg, 500 mg, Oral, Once, McKenzie, Mardene Celeste, MD   Allergies: No Known Allergies  REVIEW OF SYSTEMS:   Review of Systems  Constitutional:  Negative for chills, fatigue and fever.  HENT:   Negative for lump/mass, mouth sores, nosebleeds, sore throat and trouble swallowing.   Eyes:  Negative for eye problems.  Respiratory:  Positive for cough and shortness of breath.   Cardiovascular:  Negative for chest pain, leg swelling and palpitations.  Gastrointestinal:  Negative for abdominal pain, constipation, diarrhea, nausea and vomiting.  Genitourinary:  Positive for dysuria. Negative for bladder incontinence, difficulty urinating, frequency, hematuria and nocturia.   Musculoskeletal:  Negative for arthralgias, back pain, flank pain, myalgias and neck pain.  Skin:  Negative for itching and rash.  Neurological:  Negative for dizziness, headaches and numbness.  Hematological:  Does not bruise/bleed easily.  Psychiatric/Behavioral:  Negative for depression, sleep disturbance and suicidal ideas. The patient is not nervous/anxious.   All  other systems reviewed and are negative.    VITALS:   Height 5\' 11"  (1.803 m), weight 167 lb 12.8 oz (76.1 kg).  Wt Readings from Last 3 Encounters:  01/25/23 167 lb 12.8 oz (76.1 kg)  12/25/22 173 lb 14.4 oz (78.9 kg)  11/13/22 175 lb (79.4 kg)    Body mass index is 23.4 kg/m.  Performance status (ECOG): 1 - Symptomatic but completely ambulatory  PHYSICAL EXAM:   Physical Exam Vitals and nursing note reviewed. Exam conducted with a chaperone present.  Constitutional:      Appearance: Normal appearance.  Cardiovascular:     Rate and Rhythm: Normal rate and regular rhythm.     Pulses: Normal pulses.     Heart sounds: Normal heart sounds.  Pulmonary:     Effort: Pulmonary effort is normal.     Breath sounds: Normal breath sounds.  Abdominal:     Palpations: Abdomen is soft. There is no hepatomegaly, splenomegaly or mass.     Tenderness: There is no abdominal tenderness.  Musculoskeletal:     Right lower leg: No edema.     Left lower leg: No edema.  Lymphadenopathy:     Cervical: No cervical adenopathy.     Right cervical: No superficial, deep or posterior cervical adenopathy.    Left cervical: No superficial, deep or posterior cervical adenopathy.     Upper Body:     Right upper body: No supraclavicular or axillary adenopathy.     Left upper body: No supraclavicular or axillary adenopathy.  Neurological:     General: No focal deficit present.     Mental Status: He is alert and oriented to person, place, and time.  Psychiatric:        Mood and Affect: Mood  normal.        Behavior: Behavior normal.     LABS:      Latest Ref Rng & Units 01/25/2023   12:54 PM 01/11/2023   12:41 PM 01/03/2023    7:51 AM  CBC  WBC 4.0 - 10.5 K/uL 6.0  6.3  8.5   Hemoglobin 13.0 - 17.0 g/dL 16.1  09.6  04.5   Hematocrit 39.0 - 52.0 % 38.5  43.6  42.3   Platelets 150 - 400 K/uL 248  237  228       Latest Ref Rng & Units 01/25/2023   12:54 PM 01/11/2023   12:41 PM 01/03/2023     7:51 AM  CMP  Glucose 70 - 99 mg/dL 409  811  914   BUN 8 - 23 mg/dL 16  14  23    Creatinine 0.61 - 1.24 mg/dL 7.82  9.56  2.13   Sodium 135 - 145 mmol/L 141  143  138   Potassium 3.5 - 5.1 mmol/L 3.6  4.0  3.6   Chloride 98 - 111 mmol/L 101  103  103   CO2 22 - 32 mmol/L 29  31  27    Calcium 8.9 - 10.3 mg/dL 8.8  9.3  8.5   Total Protein 6.5 - 8.1 g/dL 7.1  7.3  7.4   Total Bilirubin <1.2 mg/dL 0.7  0.5  0.8   Alkaline Phos 38 - 126 U/L 56  52  50   AST 15 - 41 U/L 15  13  12    ALT 0 - 44 U/L 14  14  12       Lab Results  Component Value Date   CEA1 2.6 04/26/2022   /  CEA  Date Value Ref Range Status  04/26/2022 2.6 0.0 - 4.7 ng/mL Final    Comment:    (NOTE)                             Nonsmokers          <3.9                             Smokers             <5.6 Roche [employer] Electrochemiluminescence Immunoassay (ECLIA) Values obtained with different assay methods or kits cannot be used interchangeably.  Results cannot be interpreted as absolute evidence of the presence or absence of malignant disease. Performed At: [facility] [address] [place], [place] [id number] Jolene Schimke MD GE:[id number]    No results found for: "PSA1" No results found for: "[id number]" No results found for: "CAN125"  No results found for: "TOTALPROTELP", "ALBUMINELP", "A1GS", "A2GS", "BETS", "BETA2SER", "GAMS", "MSPIKE", "SPEI" No results found for: "TIBC", "FERRITIN", "IRONPCTSAT" No results found for: "LDH"   STUDIES:   No results found.

## 2023-01-25 NOTE — Patient Instructions (Addendum)
[facility] at [facility] Discharge Instructions   You were seen and examined today by Dr. Ellin Saba.  He reviewed the results of your lab work which are normal/stable.   Continue Xeloda (3 pills in the morning and 3 pills in the evening) on the days of radiation only. Hold this on Christmas Day and New Year's Day.  We will send a prescription for Pyridium to your pharmacy to help with the pain with urination. This may discolor your urine to an orange or red color. This will not effect the results of the urine specimen.   We will see you back in 2 weeks. We will repeat lab work at that time.    Thank you for choosing [facility] at [facility] to provide your oncology and hematology care.  To afford each patient quality time with our provider, please arrive at least 15 minutes before your scheduled appointment time.   If you have a lab appointment with the Cancer Center please come in thru the Main Entrance and check in at the main information desk.  You need to re-schedule your appointment should you arrive 10 or more minutes late.  We strive to give you quality time with our providers, and arriving late affects you and other patients whose appointments are after yours.  Also, if you no show three or more times for appointments you may be dismissed from the clinic at the providers discretion.     Again, thank you for choosing [facility].  Our hope is that these requests will decrease the amount of time that you wait before being seen by our physicians.       _____________________________________________________________  Should you have questions after your visit to [facility], please contact our office at [phone] and follow the prompts.  Our office hours are 8:00 a.m. and 4:30 p.m. Monday - Friday.  Please note that voicemails left after 4:00 p.m. may not be returned until the following business day.  We are  closed weekends and major holidays.  You do have access to a nurse 24-7, just call the main number to the clinic [phone] and do not press any options, hold on the line and a nurse will answer the phone.    For prescription refill requests, have your pharmacy contact our office and allow 72 hours.    Due to Covid, you will need to wear a mask upon entering the hospital. If you do not have a mask, a mask will be given to you at the Main Entrance upon arrival. For doctor visits, patients may have 1 support person age 15 or older with them. For treatment visits, patients can not have anyone with them due to social distancing guidelines and our immunocompromised population.

## 2023-01-25 NOTE — Progress Notes (Signed)
Patients port flushed without difficulty.  Good blood return noted with no bruising or swelling noted at site.  Band aid applied.  VSS with discharge and left in satisfactory condition with no s/s of distress noted.   

## 2023-02-08 ENCOUNTER — Other Ambulatory Visit: Payer: Self-pay | Admitting: *Deleted

## 2023-02-08 ENCOUNTER — Encounter: Payer: Self-pay | Admitting: *Deleted

## 2023-02-08 NOTE — Progress Notes (Signed)
 Per faxed note from [facility], patient has been prescribed Cipro 500 mg bid x 14 days for Klebsiella by UC.

## 2023-02-13 ENCOUNTER — Inpatient Hospital Stay: Payer: PPO | Attending: Hematology | Admitting: Hematology

## 2023-02-13 ENCOUNTER — Inpatient Hospital Stay: Payer: PPO

## 2023-02-13 DIAGNOSIS — R0602 Shortness of breath: Secondary | ICD-10-CM | POA: Diagnosis not present

## 2023-02-13 DIAGNOSIS — Z79899 Other long term (current) drug therapy: Secondary | ICD-10-CM | POA: Insufficient documentation

## 2023-02-13 DIAGNOSIS — N329 Bladder disorder, unspecified: Secondary | ICD-10-CM | POA: Diagnosis not present

## 2023-02-13 DIAGNOSIS — C20 Malignant neoplasm of rectum: Secondary | ICD-10-CM | POA: Insufficient documentation

## 2023-02-13 DIAGNOSIS — Z452 Encounter for adjustment and management of vascular access device: Secondary | ICD-10-CM | POA: Insufficient documentation

## 2023-02-13 DIAGNOSIS — Z87891 Personal history of nicotine dependence: Secondary | ICD-10-CM | POA: Insufficient documentation

## 2023-02-13 DIAGNOSIS — R110 Nausea: Secondary | ICD-10-CM | POA: Insufficient documentation

## 2023-02-13 DIAGNOSIS — N390 Urinary tract infection, site not specified: Secondary | ICD-10-CM | POA: Diagnosis not present

## 2023-02-13 DIAGNOSIS — I4892 Unspecified atrial flutter: Secondary | ICD-10-CM | POA: Insufficient documentation

## 2023-02-13 DIAGNOSIS — D4122 Neoplasm of uncertain behavior of left ureter: Secondary | ICD-10-CM | POA: Diagnosis not present

## 2023-02-13 DIAGNOSIS — R059 Cough, unspecified: Secondary | ICD-10-CM | POA: Diagnosis not present

## 2023-02-13 DIAGNOSIS — Z7901 Long term (current) use of anticoagulants: Secondary | ICD-10-CM | POA: Diagnosis not present

## 2023-02-13 DIAGNOSIS — R7989 Other specified abnormal findings of blood chemistry: Secondary | ICD-10-CM | POA: Diagnosis present

## 2023-02-13 LAB — CBC WITH DIFFERENTIAL/PLATELET
Abs Immature Granulocytes: 0.39 10*3/uL — ABNORMAL HIGH (ref 0.00–0.07)
Basophils Absolute: 0.1 10*3/uL (ref 0.0–0.1)
Basophils Relative: 1 %
Eosinophils Absolute: 0.1 10*3/uL (ref 0.0–0.5)
Eosinophils Relative: 1 %
HCT: 33.3 % — ABNORMAL LOW (ref 39.0–52.0)
Hemoglobin: 10.5 g/dL — ABNORMAL LOW (ref 13.0–17.0)
Immature Granulocytes: 4 %
Lymphocytes Relative: 13 %
Lymphs Abs: 1.3 10*3/uL (ref 0.7–4.0)
MCH: 30.3 pg (ref 26.0–34.0)
MCHC: 31.5 g/dL (ref 30.0–36.0)
MCV: 96 fL (ref 80.0–100.0)
Monocytes Absolute: 0.6 10*3/uL (ref 0.1–1.0)
Monocytes Relative: 6 %
Neutro Abs: 8 10*3/uL — ABNORMAL HIGH (ref 1.7–7.7)
Neutrophils Relative %: 75 %
Platelets: 388 10*3/uL (ref 150–400)
RBC: 3.47 MIL/uL — ABNORMAL LOW (ref 4.22–5.81)
RDW: 15.6 % — ABNORMAL HIGH (ref 11.5–15.5)
WBC: 10.5 10*3/uL (ref 4.0–10.5)
nRBC: 0 % (ref 0.0–0.2)

## 2023-02-13 LAB — COMPREHENSIVE METABOLIC PANEL
ALT: 19 U/L (ref 0–44)
AST: 21 U/L (ref 15–41)
Albumin: 3 g/dL — ABNORMAL LOW (ref 3.5–5.0)
Alkaline Phosphatase: 80 U/L (ref 38–126)
Anion gap: 11 (ref 5–15)
BUN: 64 mg/dL — ABNORMAL HIGH (ref 8–23)
CO2: 30 mmol/L (ref 22–32)
Calcium: 8.4 mg/dL — ABNORMAL LOW (ref 8.9–10.3)
Chloride: 95 mmol/L — ABNORMAL LOW (ref 98–111)
Creatinine, Ser: 2.52 mg/dL — ABNORMAL HIGH (ref 0.61–1.24)
GFR, Estimated: 26 mL/min — ABNORMAL LOW (ref >60)
Glucose, Bld: 180 mg/dL — ABNORMAL HIGH (ref 70–99)
Potassium: 3.8 mmol/L (ref 3.5–5.1)
Sodium: 136 mmol/L (ref 135–145)
Total Bilirubin: 0.8 mg/dL (ref 0.0–1.2)
Total Protein: 7.1 g/dL (ref 6.5–8.1)

## 2023-02-13 LAB — MAGNESIUM: Magnesium: 2.6 mg/dL — ABNORMAL HIGH (ref 1.7–2.4)

## 2023-02-13 MED ORDER — SODIUM CHLORIDE 0.9 % IV SOLN: Freq: Once | INTRAVENOUS | Status: AC

## 2023-02-13 MED ORDER — SODIUM CHLORIDE 0.9% FLUSH
10.0000 mL | INTRAVENOUS | Status: AC
  Administered 2023-02-13: 10 mL via INTRAVENOUS

## 2023-02-13 MED ORDER — HEPARIN SOD (PORK) LOCK FLUSH 100 UNIT/ML IV SOLN
500.0000 [IU] | Freq: Once | INTRAVENOUS | Status: AC
  Administered 2023-02-13: 500 [IU] via INTRAVENOUS

## 2023-02-13 NOTE — Patient Instructions (Addendum)
 [facility] at [facility] Discharge Instructions   You were seen and examined today by Dr. Rogers.  He reviewed the results of your lab work. Your creatinine (indicator of your kidney function) is elevated. Normal range is 0.61 to 1.24. Yours today is 2.52. The IV fluids we are giving today will help with this.   Dr. MARLA wants you to STOP taking the Xeloda  pills since you are feeling poorly and your kidney function is elevated. Do not take this evening's dose or any doses tomorrow. You are finished with this medication now.   We will bring you back in tomorrow for more IV fluids to help correct your kidney function.   Return as scheduled.    Thank you for choosing [facility] at [facility] to provide your oncology and hematology care.  To afford each patient quality time with our provider, please arrive at least 15 minutes before your scheduled appointment time.   If you have a lab appointment with the Cancer Center please come in thru the Main Entrance and check in at the main information desk.  You need to re-schedule your appointment should you arrive 10 or more minutes late.  We strive to give you quality time with our providers, and arriving late affects you and other patients whose appointments are after yours.  Also, if you no show three or more times for appointments you may be dismissed from the clinic at the providers discretion.     Again, thank you for choosing [facility].  Our hope is that these requests will decrease the amount of time that you wait before being seen by our physicians.       _____________________________________________________________  Should you have questions after your visit to [facility], please contact our office at [phone] and follow the prompts.  Our office hours are 8:00 a.m. and 4:30 p.m. Monday - Friday.  Please note that voicemails left after 4:00 p.m. may not be  returned until the following business day.  We are closed weekends and major holidays.  You do have access to a nurse 24-7, just call the main number to the clinic [phone] and do not press any options, hold on the line and a nurse will answer the phone.    For prescription refill requests, have your pharmacy contact our office and allow 72 hours.    Due to Covid, you will need to wear a mask upon entering the hospital. If you do not have a mask, a mask will be given to you at the Main Entrance upon arrival. For doctor visits, patients may have 1 support person age 31 or older with them. For treatment visits, patients can not have anyone with them due to social distancing guidelines and our immunocompromised population.

## 2023-02-13 NOTE — Progress Notes (Signed)
 Patient presents today for 1 Liter of Normal Saline over 2 hours and follow up appointment with Dr. Rogers. Labs pending. Patient states he has pain upon urination and is on Cipro  for a UTI. Patient has an appointment with Urology 02-28-2023 with Dr. Sherrilee per patient's words.   Hydration given today per MD orders. Tolerated infusion without adverse affects. Vital signs stable. No complaints at this time. Discharged from clinic by wheel chair  in stable condition. Alert and oriented x 3. F/U with [facility] as scheduled.

## 2023-02-13 NOTE — Progress Notes (Signed)
 [facility] [address], [place] [address]    Clinic Day:  02/13/2023  Referring physician: Roni [name]  Patient Care Team: [place], [facility] as PCP - General [name], MD as PCP - Cardiology (Cardiology) [name], MD as PCP - Electrophysiology (Cardiology) [name], RN as Oncology Nurse Navigator (Medical Oncology) Rogers Hai, MD as Medical Oncologist (Medical Oncology)   ASSESSMENT & PLAN:   Assessment: 1.  Stage II (T3 N0 M0) low rectal adenocarcinoma, MMR preserved: - 30-month history of intermittent rectal bleeding.  No weight loss. - Colonoscopy (04/26/2022): Large cauliflower mass palpated in the distal rectum on DRE.  Scattered diverticula in the descending colon.  5 mm polyp in the ascending colon.  In the rectum, exophytic semilunar neoplastic appearing process beginning at the anal verge and extending proximally about 6 cm. - Pathology (04/26/2022): Invasive moderately differentiated adenocarcinoma of the rectal mass.  MMR preserved. - CT CAP (05/01/2022): Nodular wall thickening along the rectum.  No intrinsic abnormal lymph nodes.  No liver lesion.  Fatty liver.  Nodule along the left side of the urinary bladder.  Small mass along the course of the distal left ureter.  Worrisome for multifocal TCC.  Small lung nodules measuring up to 4 mm. - MRI pelvis (05/03/2022): 6.4 cm left lateral low rectal tumor abutting the internal anal sphincter, T3c N0.  Distance from tumor to the internal anal sphincter is 0 cm.  Tumor measures 6.4 cm in length and up to 2.2 cm in thickness. - PET scan (05/11/2022): No evidence of metastatic disease. - Met with Dr. Debby on 05/16/2022: She is in agreement with TNT and has discussed APR with colostomy. - TNT: 8 cycles of FOLFOX from 06/07/2022 through 09/25/2022. - Chemoradiation therapy with Xeloda  1500 mg twice daily started on 12/28/2022   2.  Social/family history: -He lives at  home and is independent of ADLs and IADLs.  He is accompanied by his son today.  Worked at [employer] prior to retirement.  Also served in [place] but denied any exposure to agent orange.  Quit smoking 1 year ago.  Smoked 1 to 2 packs/day for the last 61 years.  Started at age 67. - No family history of malignancies.    Plan: 1.  Stage II (T3 cN0 M0) low rectal adenocarcinoma, MMR preserved: - Chemoradiation therapy started on 12/28/2022. - He is continuing to take Xeloda  3 tablets twice daily on the days of radiation. - I have discussed with Dr. Dannielle on 02/12/2023.  He was reportedly hypotensive in their office.  He was found to have Klebsiella UTI again after 7 days of prior therapy.  He was started back on Cipro .  Labs done at [facility] showed creatinine elevated at 2.94 with a BUN of 70.  There was also report from his son that he was confused/delirious. - Today he appears oriented.  His repeat labs show creatinine 2.52 and BUN of 64.  LFTs are normal. - Will give 1 L of normal saline today.  I have recommended that he hold Xeloda  evening dose and tomorrow.  He will finish XRT on 02/14/2023. - He will come back tomorrow for 1 more liter of normal saline.  Will check his BMP again tomorrow. - RTC 4 weeks with repeat rectal MRI and CT chest and abdomen with contrast.  I will also arrange follow-up with Dr. Debby 1 week after the scans.   2.  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   3.  A-flutter with RVR: - Rate well-controlled since ablation.  Today pulse rate is 76.    Orders Placed This Encounter  Procedures   MR PELVIS WO CM RECTAL CA STAGING    Standing Status:   Future    Expected Date:   03/16/2023    Expiration Date:   02/13/2024    If indicated for the ordered procedure, I authorize the administration of contrast media per Radiology  protocol:   Yes    What is the patient's sedation requirement?:   No Sedation    Does the patient have a pacemaker or implanted devices?:   No    Preferred imaging location?:   [facility] (table limit - 550lbs)    Release to patient:   Immediate   CT CHEST W CONTRAST    Standing Status:   Future    Expected Date:   03/16/2023    Expiration Date:   02/13/2024    If indicated for the ordered procedure, I authorize the administration of contrast media per Radiology protocol:   Yes    Does the patient have a contrast media/X-ray dye allergy?:   No    Preferred imaging location?:   [facility]   CT ABDOMEN W CONTRAST    Standing Status:   Future    Expected Date:   03/16/2023    Expiration Date:   02/13/2024    Informed consent is required to be obtained if the patient has a GFR less than 73mL/min (consider nephrology consult):   Provider notified    If indicated for the ordered procedure, I authorize the administration of contrast media per Radiology protocol:   Yes    Does the patient have a contrast media/X-ray dye allergy?:   No    Preferred imaging location?:   [facility]    If indicated for the ordered procedure, I authorize the administration of oral contrast media per Radiology protocol:   Yes      I,Helena R Teague,acting as a scribe for Alean Stands, MD.,have documented all relevant documentation on the behalf of Alean Stands, MD,as directed by  Alean Stands, MD while in the presence of Alean Stands, MD.  I, Alean Stands MD, have reviewed the above documentation for accuracy and completeness, and I agree with the above.      Alean Stands, MD   1/7/20254:19 PM  CHIEF COMPLAINT:   Diagnosis: Rectal adenocarcinoma    Cancer Staging  Rectal adenocarcinoma [facility]) Staging form: Colon and Rectum, AJCC 8th Edition - Clinical stage from 05/07/2022: Stage IIA (cT3, cN0, cM0) - Unsigned    Prior Therapy: FOLFOX, 8  cycles, 06/07/22 - 09/25/22  Current Therapy:  Concurrent chemoradiation with Xeloda , to be followed by surgery    HISTORY OF PRESENT ILLNESS:   Oncology History  Rectal adenocarcinoma (HCC)  05/07/2022 Initial Diagnosis   Rectal adenocarcinoma (HCC)   06/07/2022 -  Chemotherapy   Patient is on Treatment Plan : COLORECTAL FOLFOX q14d x 4 months        INTERVAL HISTORY:   Ryken is a 78 y.o. male presenting to clinic today for follow up of Rectal adenocarcinoma. He was last seen by me on 01/25/23.  Since his last visit, he contracted a recurrent UTI and is on Cipro  500 mg BID x 14 days starting 02/12/23.  E is no longer taking Bactrim .  Today, he states that he is doing well overall. His appetite level is at 40%. His energy level is at 25%.   He is occasionally lightheaded when standing up. His last day of XRT is 02/14/23. He notes decreased appetite. He last took Xeloda  this morning. He reports he doe not typically drink a lot of water , and mainly has green teas. He has been attempting to increase water  intake. He denies any loose stools or diarrhea. He denies any mouth sores and pain on the soles of the feet or palms of the hands.   PAST MEDICAL HISTORY:   Past Medical History: Past Medical History:  Diagnosis Date   Back pain    COPD (chronic obstructive pulmonary disease) (HCC)    Diabetes mellitus without complication (HCC)    Dyspnea    Dysrhythmia    Hyperlipidemia    Oxygen dependent    4L when needed    Surgical History: Past Surgical History:  Procedure Laterality Date   A-FLUTTER ABLATION N/A 08/23/2022   Procedure: A-FLUTTER ABLATION;  Surgeon: [name], MD;  Location: [facility];  Service: Cardiovascular;  Laterality: N/A;   BIOPSY  04/26/2022   Procedure: BIOPSY;  Surgeon: Shaaron Lamar HERO, MD;  Location: [facility] ENDO SUITE;  Service: Endoscopy;;   BLADDER INSTILLATION N/A 11/16/2022   Procedure: BLADDER INSTILLATION- Gemcitibine;  Surgeon: Sherrilee Belvie CROME, MD;  Location: [facility] ORS;  Service: Urology;  Laterality: N/A;   COLONOSCOPY WITH PROPOFOL  N/A 04/26/2022   Procedure: COLONOSCOPY WITH PROPOFOL ;  Surgeon: Shaaron Lamar HERO, MD;  Location: [facility];  Service: Endoscopy;  Laterality: N/A;  10:00am; ASA 3   CYSTOSCOPY N/A 11/16/2022   Procedure: CYSTOSCOPY;  Surgeon: Sherrilee Belvie CROME, MD;  Location: [facility] ORS;  Service: Urology;  Laterality: N/A;   hemorhoidectomy     IR IMAGING GUIDED PORT INSERTION  05/12/2022   POLYPECTOMY  04/26/2022   Procedure: POLYPECTOMY;  Surgeon: Shaaron Lamar HERO, MD;  Location: [facility];  Service: Endoscopy;;   TRANSURETHRAL RESECTION OF BLADDER TUMOR N/A 11/16/2022   Procedure: TRANSURETHRAL RESECTION OF BLADDER TUMOR (TURBT);  Surgeon: Sherrilee Belvie CROME, MD;  Location: [facility] ORS;  Service: Urology;  Laterality: N/A;    Social History: Social History   Socioeconomic History   Marital status: Widowed    Spouse name: Not on file   Number of children: 2   Years of education: Not on file   Highest education level: Not on file  Occupational History   Not on file  Tobacco Use   Smoking status: Former    Current packs/day: 0.00    Types: Cigarettes    Start date: 08/1966    Quit date: 08/2016    Years since quitting: 6.5   Smokeless tobacco: Never  Vaping Use   Vaping status: Never Used  Substance and Sexual Activity   Alcohol use: Never   Drug use: Never   Sexual activity: Not Currently  Other Topics Concern   Not on file  Social History Narrative   Not on file   Social Drivers of Health   Financial Resource Strain: Not on file  Food Insecurity: No Food Insecurity (07/07/2022)   Hunger Vital Sign    Worried About Running Out of Food in the Last Year: Never true    Ran Out of Food in the Last Year: Never true  Transportation Needs: No Transportation Needs (07/07/2022)   PRAPARE - Transportation  Lack of Transportation (Medical): No    Lack of Transportation (Non-Medical): No  Physical  Activity: Not on file  Stress: Not on file  Social Connections: Not on file  Intimate Partner Violence: Not At Risk (07/07/2022)   Humiliation, Afraid, Rape, and Kick questionnaire    Fear of Current or Ex-Partner: No    Emotionally Abused: No    Physically Abused: No    Sexually Abused: No    Family History: No family history on file.  Current Medications:  Current Outpatient Medications:    acidophilus (RISAQUAD) CAPS capsule, Take 1 capsule by mouth daily., Disp: , Rfl:    albuterol  (VENTOLIN  HFA) 108 (90 Base) MCG/ACT inhaler, Inhale 2 puffs into the lungs every 6 (six) hours as needed for wheezing or shortness of breath., Disp: , Rfl:    atorvastatin  (LIPITOR) 20 MG tablet, Take 20 mg by mouth daily., Disp: , Rfl:    capecitabine  (XELODA ) 500 MG tablet, Take 3 tablets (1,500 mg total) by mouth 2 (two) times daily after a meal. Take Monday-Friday. Take only on days of radiation., Disp: 90 tablet, Rfl: 0   ciprofloxacin  (CIPRO ) 500 MG tablet, Take 500 mg by mouth 2 (two) times daily., Disp: , Rfl:    dextrose  5 % SOLN 1,000 mL with fluorouracil  5 GM/100ML SOLN, Inject into the vein., Disp: , Rfl:    empagliflozin  (JARDIANCE ) 25 MG TABS tablet, Take 25 mg by mouth daily., Disp: , Rfl:    febuxostat  (ULORIC ) 40 MG tablet, Take 1 tablet (40 mg total) by mouth daily., Disp: 90 tablet, Rfl: 1   fenofibrate 54 MG tablet, Take 54 mg by mouth daily., Disp: , Rfl:    HYDROcodone -acetaminophen  (NORCO) 5-325 MG tablet, Take 1 tablet by mouth every 6 (six) hours as needed for moderate pain., Disp: 15 tablet, Rfl: 0   LEUCOVORIN  CALCIUM  IV, Inject into the vein., Disp: , Rfl:    lidocaine -prilocaine  (EMLA ) cream, Apply 1 Application topically as needed., Disp: 30 g, Rfl: 0   loperamide  (IMODIUM ) 2 MG capsule, Take 2 mg by mouth 3 (three) times daily as needed for diarrhea or loose stools., Disp: , Rfl:    MELATONIN PO, Take 1 tablet by mouth at bedtime as needed (sleep)., Disp: , Rfl:    Misc.  Devices MISC, Please provide with shower chair, Disp: 1 Units, Rfl: 0   OXALIPLATIN  IV, Inject into the vein., Disp: , Rfl:    phenazopyridine  (PYRIDIUM ) 100 MG tablet, Take 1 tablet (100 mg total) by mouth 3 (three) times daily as needed for pain., Disp: 12 tablet, Rfl: 0   prochlorperazine  (COMPAZINE ) 10 MG tablet, Take 1 tablet (10 mg total) by mouth every 6 (six) hours as needed for nausea or vomiting., Disp: 30 tablet, Rfl: 2   traMADol  (ULTRAM ) 50 MG tablet, Take 50 mg by mouth every 6 (six) hours as needed., Disp: , Rfl:   Current Facility-Administered Medications:    ciprofloxacin  (CIPRO ) tablet 500 mg, 500 mg, Oral, Once, McKenzie, Belvie CROME, MD   ciprofloxacin  (CIPRO ) tablet 500 mg, 500 mg, Oral, Once, McKenzie, Belvie CROME, MD   Allergies: No Known Allergies  REVIEW OF SYSTEMS:   Review of Systems  Constitutional:  Negative for chills, fatigue and fever.  HENT:   Negative for lump/mass, mouth sores, nosebleeds, sore throat and trouble swallowing.   Eyes:  Negative for eye problems.  Respiratory:  Positive for cough and shortness of breath.   Cardiovascular:  Negative for chest pain, leg swelling and palpitations.  Gastrointestinal:  Positive for nausea. Negative for abdominal pain, constipation, diarrhea and vomiting.  Genitourinary:  Positive for dysuria (6-7/10 severity). Negative for bladder incontinence, difficulty urinating, frequency, hematuria and nocturia.   Musculoskeletal:  Negative for arthralgias, back pain, flank pain, myalgias and neck pain.  Skin:  Negative for itching and rash.  Neurological:  Negative for dizziness, headaches and numbness.  Hematological:  Does not bruise/bleed easily.  Psychiatric/Behavioral:  Negative for depression, sleep disturbance and suicidal ideas. The patient is not nervous/anxious.   All other systems reviewed and are negative.    VITALS:   There were no vitals taken for this visit.  Wt Readings from Last 3 Encounters:  02/13/23  158 lb 4.6 oz (71.8 kg)  01/25/23 167 lb 12.8 oz (76.1 kg)  12/25/22 173 lb 14.4 oz (78.9 kg)    There is no height or weight on file to calculate BMI.  Performance status (ECOG): 1 - Symptomatic but completely ambulatory  PHYSICAL EXAM:   Physical Exam Vitals and nursing note reviewed. Exam conducted with a chaperone present.  Constitutional:      Appearance: Normal appearance.  Cardiovascular:     Rate and Rhythm: Normal rate and regular rhythm.     Pulses: Normal pulses.     Heart sounds: Normal heart sounds.  Pulmonary:     Effort: Pulmonary effort is normal.     Breath sounds: Normal breath sounds.  Abdominal:     Palpations: Abdomen is soft. There is no hepatomegaly, splenomegaly or mass.     Tenderness: There is no abdominal tenderness.  Musculoskeletal:     Right lower leg: No edema.     Left lower leg: No edema.  Lymphadenopathy:     Cervical: No cervical adenopathy.     Right cervical: No superficial, deep or posterior cervical adenopathy.    Left cervical: No superficial, deep or posterior cervical adenopathy.     Upper Body:     Right upper body: No supraclavicular or axillary adenopathy.     Left upper body: No supraclavicular or axillary adenopathy.  Neurological:     General: No focal deficit present.     Mental Status: He is alert and oriented to person, place, and time.  Psychiatric:        Mood and Affect: Mood normal.        Behavior: Behavior normal.     LABS:      Latest Ref Rng & Units 02/13/2023   12:57 PM 01/25/2023   12:54 PM 01/11/2023   12:41 PM  CBC  WBC 4.0 - 10.5 K/uL 10.5  6.0  6.3   Hemoglobin 13.0 - 17.0 g/dL 89.4  87.7  86.6   Hematocrit 39.0 - 52.0 % 33.3  38.5  43.6   Platelets 150 - 400 K/uL 388  248  237       Latest Ref Rng & Units 02/13/2023   12:57 PM 01/25/2023   12:54 PM 01/11/2023   12:41 PM  CMP  Glucose 70 - 99 mg/dL 819  894  871   BUN 8 - 23 mg/dL 64  16  14   Creatinine 0.61 - 1.24 mg/dL 7.47  9.13  9.22    Sodium 135 - 145 mmol/L 136  141  143   Potassium 3.5 - 5.1 mmol/L 3.8  3.6  4.0   Chloride 98 - 111 mmol/L 95  101  103   CO2 22 - 32 mmol/L 30  29  31    Calcium   8.9 - 10.3 mg/dL 8.4  8.8  9.3   Total Protein 6.5 - 8.1 g/dL 7.1  7.1  7.3   Total Bilirubin 0.0 - 1.2 mg/dL 0.8  0.7  0.5   Alkaline Phos 38 - 126 U/L 80  56  52   AST 15 - 41 U/L 21  15  13    ALT 0 - 44 U/L 19  14  14       Lab Results  Component Value Date   CEA1 2.6 04/26/2022   /  CEA  Date Value Ref Range Status  04/26/2022 2.6 0.0 - 4.7 ng/mL Final    Comment:    (NOTE)                             Nonsmokers          <3.9                             Smokers             <5.6 Roche [employer] Electrochemiluminescence Immunoassay (ECLIA) Values obtained with different assay methods or kits cannot be used interchangeably.  Results cannot be interpreted as absolute evidence of the presence or absence of malignant disease. Performed At: [facility] [address] [place], [place] [id number] Jennette Shorter MD Ey:[phone]    No results found for: PSA1 No results found for: CAN199 No results found for: CAN125  No results found for: TOTALPROTELP, ALBUMINELP, A1GS, A2GS, BETS, BETA2SER, GAMS, MSPIKE, SPEI No results found for: TIBC, FERRITIN, IRONPCTSAT No results found for: LDH   STUDIES:   No results found.

## 2023-02-14 ENCOUNTER — Inpatient Hospital Stay: Payer: PPO

## 2023-02-14 ENCOUNTER — Encounter: Payer: Self-pay | Admitting: Urology

## 2023-02-14 VITALS — BP 106/57 | HR 60 | Temp 96.2°F | Resp 20

## 2023-02-14 DIAGNOSIS — R7989 Other specified abnormal findings of blood chemistry: Secondary | ICD-10-CM | POA: Diagnosis not present

## 2023-02-14 DIAGNOSIS — Z95828 Presence of other vascular implants and grafts: Secondary | ICD-10-CM

## 2023-02-14 DIAGNOSIS — C20 Malignant neoplasm of rectum: Secondary | ICD-10-CM

## 2023-02-14 LAB — BASIC METABOLIC PANEL
Anion gap: 9 (ref 5–15)
BUN: 53 mg/dL — ABNORMAL HIGH (ref 8–23)
CO2: 27 mmol/L (ref 22–32)
Calcium: 8.1 mg/dL — ABNORMAL LOW (ref 8.9–10.3)
Chloride: 99 mmol/L (ref 98–111)
Creatinine, Ser: 2.33 mg/dL — ABNORMAL HIGH (ref 0.61–1.24)
GFR, Estimated: 28 mL/min — ABNORMAL LOW (ref >60)
Glucose, Bld: 194 mg/dL — ABNORMAL HIGH (ref 70–99)
Potassium: 3.8 mmol/L (ref 3.5–5.1)
Sodium: 135 mmol/L (ref 135–145)

## 2023-02-14 MED ORDER — SODIUM CHLORIDE 0.9 % IV SOLN: INTRAVENOUS | Status: DC

## 2023-02-14 MED ORDER — HEPARIN SOD (PORK) LOCK FLUSH 100 UNIT/ML IV SOLN
500.0000 [IU] | Freq: Once | INTRAVENOUS | Status: AC
  Administered 2023-02-14: 500 [IU] via INTRAVENOUS

## 2023-02-14 MED ORDER — SODIUM CHLORIDE FLUSH 0.9 % IV SOLN
10.0000 mL | Freq: Once | INTRAVENOUS | Status: AC
Start: 2023-02-14 — End: 2023-02-14
  Administered 2023-02-14: 10 mL via INTRAVENOUS
  Filled 2023-02-14: qty 10

## 2023-02-14 NOTE — Progress Notes (Signed)
 Patient presents today for 1 Liter of normal saline. Patient's son reports patient passed small bright red blood clot in stool last night, Dr. Rogers made aware no additional orders at this time. Per Dr. Rogers patient will need to come back tomorrow for another liter of normal saline and recheck BMP. Patient made aware and schedule given to patient.  Patient tolerated hydration therapy with no complaints voiced. Side effects with management reviewed with understanding verbalized. Port site clean and dry with no bruising or swelling noted at site. Good blood return noted before and after administration of therapy. Patient left in satisfactory condition with VSS and no s/s of distress noted.

## 2023-02-14 NOTE — Patient Instructions (Signed)
 [facility] [place] - A DEPT OF [place]  Discharge Instructions: Thank you for choosing [facility] to provide your oncology and hematology care.  If you have a lab appointment with the Cancer Center - please note that after April 8th, 2024, all labs will be drawn in the cancer center.  You do not have to check in or register with the main entrance as you have in the past but will complete your check-in in the cancer center.  Wear comfortable clothing and clothing appropriate for easy access to any Portacath or PICC line.   We strive to give you quality time with your provider. You may need to reschedule your appointment if you arrive late (15 or more minutes).  Arriving late affects you and other patients whose appointments are after yours.  Also, if you miss three or more appointments without notifying the office, you may be dismissed from the clinic at the provider's discretion.      For prescription refill requests, have your pharmacy contact our office and allow 72 hours for refills to be completed.    Today you received the following Normal saline, return as scheduled.   To help prevent nausea and vomiting after your treatment, we encourage you to take your nausea medication as directed.  BELOW ARE SYMPTOMS THAT SHOULD BE REPORTED IMMEDIATELY: *FEVER GREATER THAN 100.4 F (38 C) OR HIGHER *CHILLS OR SWEATING *NAUSEA AND VOMITING THAT IS NOT CONTROLLED WITH YOUR NAUSEA MEDICATION *UNUSUAL SHORTNESS OF BREATH *UNUSUAL BRUISING OR BLEEDING *URINARY PROBLEMS (pain or burning when urinating, or frequent urination) *BOWEL PROBLEMS (unusual diarrhea, constipation, pain near the anus) TENDERNESS IN MOUTH AND THROAT WITH OR WITHOUT PRESENCE OF ULCERS (sore throat, sores in mouth, or a toothache) UNUSUAL RASH, SWELLING OR PAIN  UNUSUAL VAGINAL DISCHARGE OR ITCHING   Items with * indicate a potential emergency and should be followed up as soon as  possible or go to the Emergency Department if any problems should occur.  Please show the CHEMOTHERAPY ALERT CARD or IMMUNOTHERAPY ALERT CARD at check-in to the Emergency Department and triage nurse.  Should you have questions after your visit or need to cancel or reschedule your appointment, please contact [facility] [place] - A DEPT OF [facility] [phone]  and follow the prompts.  Office hours are 8:00 a.m. to 4:30 p.m. Monday - Friday. Please note that voicemails left after 4:00 p.m. may not be returned until the following business day.  We are closed weekends and major holidays. You have access to a nurse at all times for urgent questions. Please call the main number to the clinic [phone] and follow the prompts.  For any non-urgent questions, you may also contact your provider using MyChart. We now offer e-Visits for anyone 32 and older to request care online for non-urgent symptoms. For details visit [email or web address].   Also download the MyChart app! Go to the app store, search MyChart, open the app, select [place], and log in with your MyChart username and password.

## 2023-02-15 ENCOUNTER — Inpatient Hospital Stay: Payer: PPO

## 2023-02-15 VITALS — BP 115/63 | HR 69 | Resp 20

## 2023-02-15 DIAGNOSIS — R7989 Other specified abnormal findings of blood chemistry: Secondary | ICD-10-CM | POA: Diagnosis not present

## 2023-02-15 DIAGNOSIS — C20 Malignant neoplasm of rectum: Secondary | ICD-10-CM

## 2023-02-15 DIAGNOSIS — Z95828 Presence of other vascular implants and grafts: Secondary | ICD-10-CM

## 2023-02-15 LAB — BASIC METABOLIC PANEL
Anion gap: 10 (ref 5–15)
BUN: 51 mg/dL — ABNORMAL HIGH (ref 8–23)
CO2: 27 mmol/L (ref 22–32)
Calcium: 8.1 mg/dL — ABNORMAL LOW (ref 8.9–10.3)
Chloride: 100 mmol/L (ref 98–111)
Creatinine, Ser: 2.21 mg/dL — ABNORMAL HIGH (ref 0.61–1.24)
GFR, Estimated: 30 mL/min — ABNORMAL LOW (ref >60)
Glucose, Bld: 162 mg/dL — ABNORMAL HIGH (ref 70–99)
Potassium: 4.5 mmol/L (ref 3.5–5.1)
Sodium: 137 mmol/L (ref 135–145)

## 2023-02-15 MED ORDER — SODIUM CHLORIDE 0.9 % IV SOLN
INTRAVENOUS | Status: DC
Start: 2023-02-15 — End: 2023-02-15

## 2023-02-15 MED ORDER — HEPARIN SOD (PORK) LOCK FLUSH 100 UNIT/ML IV SOLN
500.0000 [IU] | Freq: Once | INTRAVENOUS | Status: AC
  Administered 2023-02-15: 500 [IU] via INTRAVENOUS

## 2023-02-15 NOTE — Patient Instructions (Signed)
 [facility] [place] - A DEPT OF [place]  Discharge Instructions: Thank you for choosing [facility] to provide your oncology and hematology care.  If you have a lab appointment with the Cancer Center - please note that after April 8th, 2024, all labs will be drawn in the cancer center.  You do not have to check in or register with the main entrance as you have in the past but will complete your check-in in the cancer center.  Wear comfortable clothing and clothing appropriate for easy access to any Portacath or PICC line.   We strive to give you quality time with your provider. You may need to reschedule your appointment if you arrive late (15 or more minutes).  Arriving late affects you and other patients whose appointments are after yours.  Also, if you miss three or more appointments without notifying the office, you may be dismissed from the clinic at the provider's discretion.      For prescription refill requests, have your pharmacy contact our office and allow 72 hours for refills to be completed.    Today you received the following normal saline.   To help prevent nausea and vomiting after your treatment, we encourage you to take your nausea medication as directed.  BELOW ARE SYMPTOMS THAT SHOULD BE REPORTED IMMEDIATELY: *FEVER GREATER THAN 100.4 F (38 C) OR HIGHER *CHILLS OR SWEATING *NAUSEA AND VOMITING THAT IS NOT CONTROLLED WITH YOUR NAUSEA MEDICATION *UNUSUAL SHORTNESS OF BREATH *UNUSUAL BRUISING OR BLEEDING *URINARY PROBLEMS (pain or burning when urinating, or frequent urination) *BOWEL PROBLEMS (unusual diarrhea, constipation, pain near the anus) TENDERNESS IN MOUTH AND THROAT WITH OR WITHOUT PRESENCE OF ULCERS (sore throat, sores in mouth, or a toothache) UNUSUAL RASH, SWELLING OR PAIN  UNUSUAL VAGINAL DISCHARGE OR ITCHING   Items with * indicate a potential emergency and should be followed up as soon as possible or go to the  Emergency Department if any problems should occur.  Please show the CHEMOTHERAPY ALERT CARD or IMMUNOTHERAPY ALERT CARD at check-in to the Emergency Department and triage nurse.  Should you have questions after your visit or need to cancel or reschedule your appointment, please contact [facility] [place] - A DEPT OF [facility] [phone]  and follow the prompts.  Office hours are 8:00 a.m. to 4:30 p.m. Monday - Friday. Please note that voicemails left after 4:00 p.m. may not be returned until the following business day.  We are closed weekends and major holidays. You have access to a nurse at all times for urgent questions. Please call the main number to the clinic [phone] and follow the prompts.  For any non-urgent questions, you may also contact your provider using MyChart. We now offer e-Visits for anyone 26 and older to request care online for non-urgent symptoms. For details visit [email or web address].   Also download the MyChart app! Go to the app store, search MyChart, open the app, select [place], and log in with your MyChart username and password.

## 2023-02-15 NOTE — Progress Notes (Signed)
 Patient tolerated 1 liter of normal saline with no complaints voiced. Side effects with management reviewed with understanding verbalized. Port site clean and dry with no bruising or swelling noted at site. Good blood return noted before and after administration of therapy. Band aid applied. Patient left in satisfactory condition with VSS and no s/s of distress noted.

## 2023-02-16 ENCOUNTER — Inpatient Hospital Stay: Payer: PPO

## 2023-02-19 ENCOUNTER — Other Ambulatory Visit: Payer: Self-pay

## 2023-02-19 ENCOUNTER — Emergency Department (HOSPITAL_COMMUNITY): Payer: PPO

## 2023-02-19 ENCOUNTER — Inpatient Hospital Stay (HOSPITAL_COMMUNITY)
Admission: EM | Admit: 2023-02-19 | Discharge: 2023-02-23 | DRG: 811 | Disposition: A | Discharge status: Home Health | Payer: PPO | Attending: Family Medicine | Admitting: Family Medicine

## 2023-02-19 ENCOUNTER — Encounter (HOSPITAL_COMMUNITY): Payer: Self-pay

## 2023-02-19 DIAGNOSIS — Z79899 Other long term (current) drug therapy: Secondary | ICD-10-CM | POA: Diagnosis not present

## 2023-02-19 DIAGNOSIS — E119 Type 2 diabetes mellitus without complications: Secondary | ICD-10-CM | POA: Diagnosis present

## 2023-02-19 DIAGNOSIS — R195 Other fecal abnormalities: Secondary | ICD-10-CM | POA: Diagnosis not present

## 2023-02-19 DIAGNOSIS — R935 Abnormal findings on diagnostic imaging of other abdominal regions, including retroperitoneum: Secondary | ICD-10-CM | POA: Diagnosis not present

## 2023-02-19 DIAGNOSIS — E861 Hypovolemia: Secondary | ICD-10-CM | POA: Diagnosis present

## 2023-02-19 DIAGNOSIS — G9341 Metabolic encephalopathy: Secondary | ICD-10-CM | POA: Diagnosis present

## 2023-02-19 DIAGNOSIS — N170 Acute kidney failure with tubular necrosis: Secondary | ICD-10-CM | POA: Diagnosis present

## 2023-02-19 DIAGNOSIS — R54 Age-related physical debility: Secondary | ICD-10-CM | POA: Diagnosis present

## 2023-02-19 DIAGNOSIS — R109 Unspecified abdominal pain: Secondary | ICD-10-CM | POA: Diagnosis not present

## 2023-02-19 DIAGNOSIS — R4182 Altered mental status, unspecified: Secondary | ICD-10-CM | POA: Diagnosis not present

## 2023-02-19 DIAGNOSIS — N3081 Other cystitis with hematuria: Secondary | ICD-10-CM | POA: Diagnosis present

## 2023-02-19 DIAGNOSIS — R338 Other retention of urine: Secondary | ICD-10-CM

## 2023-02-19 DIAGNOSIS — T8383XA Hemorrhage of genitourinary prosthetic devices, implants and grafts, initial encounter: Secondary | ICD-10-CM | POA: Diagnosis not present

## 2023-02-19 DIAGNOSIS — Z87891 Personal history of nicotine dependence: Secondary | ICD-10-CM

## 2023-02-19 DIAGNOSIS — J9611 Chronic respiratory failure with hypoxia: Secondary | ICD-10-CM | POA: Diagnosis present

## 2023-02-19 DIAGNOSIS — J449 Chronic obstructive pulmonary disease, unspecified: Secondary | ICD-10-CM | POA: Diagnosis present

## 2023-02-19 DIAGNOSIS — N179 Acute kidney failure, unspecified: Secondary | ICD-10-CM

## 2023-02-19 DIAGNOSIS — N135 Crossing vessel and stricture of ureter without hydronephrosis: Secondary | ICD-10-CM | POA: Diagnosis present

## 2023-02-19 DIAGNOSIS — L89152 Pressure ulcer of sacral region, stage 2: Secondary | ICD-10-CM | POA: Diagnosis present

## 2023-02-19 DIAGNOSIS — R918 Other nonspecific abnormal finding of lung field: Secondary | ICD-10-CM | POA: Diagnosis not present

## 2023-02-19 DIAGNOSIS — R1084 Generalized abdominal pain: Secondary | ICD-10-CM | POA: Diagnosis not present

## 2023-02-19 DIAGNOSIS — K921 Melena: Secondary | ICD-10-CM | POA: Diagnosis not present

## 2023-02-19 DIAGNOSIS — E871 Hypo-osmolality and hyponatremia: Secondary | ICD-10-CM | POA: Diagnosis present

## 2023-02-19 DIAGNOSIS — D62 Acute posthemorrhagic anemia: Secondary | ICD-10-CM | POA: Diagnosis present

## 2023-02-19 DIAGNOSIS — E875 Hyperkalemia: Secondary | ICD-10-CM | POA: Diagnosis present

## 2023-02-19 DIAGNOSIS — K5730 Diverticulosis of large intestine without perforation or abscess without bleeding: Secondary | ICD-10-CM | POA: Diagnosis not present

## 2023-02-19 DIAGNOSIS — Z9981 Dependence on supplemental oxygen: Secondary | ICD-10-CM

## 2023-02-19 DIAGNOSIS — Z7984 Long term (current) use of oral hypoglycemic drugs: Secondary | ICD-10-CM

## 2023-02-19 DIAGNOSIS — C20 Malignant neoplasm of rectum: Secondary | ICD-10-CM | POA: Diagnosis present

## 2023-02-19 DIAGNOSIS — K922 Gastrointestinal hemorrhage, unspecified: Principal | ICD-10-CM | POA: Diagnosis present

## 2023-02-19 DIAGNOSIS — K625 Hemorrhage of anus and rectum: Secondary | ICD-10-CM | POA: Diagnosis not present

## 2023-02-19 DIAGNOSIS — D649 Anemia, unspecified: Secondary | ICD-10-CM | POA: Diagnosis not present

## 2023-02-19 DIAGNOSIS — Y658 Other specified misadventures during surgical and medical care: Secondary | ICD-10-CM | POA: Diagnosis not present

## 2023-02-19 DIAGNOSIS — Z79890 Hormone replacement therapy (postmenopausal): Secondary | ICD-10-CM

## 2023-02-19 DIAGNOSIS — I480 Paroxysmal atrial fibrillation: Secondary | ICD-10-CM | POA: Diagnosis present

## 2023-02-19 DIAGNOSIS — E785 Hyperlipidemia, unspecified: Secondary | ICD-10-CM | POA: Diagnosis present

## 2023-02-19 DIAGNOSIS — R319 Hematuria, unspecified: Secondary | ICD-10-CM | POA: Diagnosis not present

## 2023-02-19 DIAGNOSIS — L8990 Pressure ulcer of unspecified site, unspecified stage: Secondary | ICD-10-CM | POA: Insufficient documentation

## 2023-02-19 LAB — LACTIC ACID, PLASMA
Lactic Acid, Venous: 3.7 mmol/L (ref 0.5–1.9)
Lactic Acid, Venous: 2.8 mmol/L (ref 0.5–1.9)

## 2023-02-19 LAB — URINALYSIS, ROUTINE W REFLEX MICROSCOPIC
Bilirubin Urine: NEGATIVE
Glucose, UA: 150 mg/dL — AB
Ketones, ur: NEGATIVE mg/dL
Nitrite: NEGATIVE
Protein, ur: 100 mg/dL — AB
RBC / HPF: >50 RBC/hpf (ref 0–5)
Specific Gravity, Urine: 1.009 (ref 1.005–1.030)
WBC, UA: >50 WBC/hpf (ref 0–5)
pH: 7 (ref 5.0–8.0)

## 2023-02-19 LAB — CBC WITH DIFFERENTIAL/PLATELET
Abs Immature Granulocytes: 0.1 10*3/uL — ABNORMAL HIGH (ref 0.00–0.07)
Basophils Absolute: 0 10*3/uL (ref 0.0–0.1)
Basophils Relative: 0 %
Eosinophils Absolute: 0.1 10*3/uL (ref 0.0–0.5)
Eosinophils Relative: 1 %
HCT: 25.7 % — ABNORMAL LOW (ref 39.0–52.0)
Hemoglobin: 7.6 g/dL — ABNORMAL LOW (ref 13.0–17.0)
Immature Granulocytes: 1 %
Lymphocytes Relative: 13 %
Lymphs Abs: 1.9 10*3/uL (ref 0.7–4.0)
MCH: 29.7 pg (ref 26.0–34.0)
MCHC: 29.6 g/dL — ABNORMAL LOW (ref 30.0–36.0)
MCV: 100.4 fL — ABNORMAL HIGH (ref 80.0–100.0)
Monocytes Absolute: 0.7 10*3/uL (ref 0.1–1.0)
Monocytes Relative: 5 %
Neutro Abs: 11.8 10*3/uL — ABNORMAL HIGH (ref 1.7–7.7)
Neutrophils Relative %: 80 %
Platelets: 463 10*3/uL — ABNORMAL HIGH (ref 150–400)
RBC: 2.56 MIL/uL — ABNORMAL LOW (ref 4.22–5.81)
RDW: 15.1 % (ref 11.5–15.5)
WBC: 14.6 10*3/uL — ABNORMAL HIGH (ref 4.0–10.5)
nRBC: 0 % (ref 0.0–0.2)

## 2023-02-19 LAB — CBC
HCT: 23.6 % — ABNORMAL LOW (ref 39.0–52.0)
Hemoglobin: 7 g/dL — ABNORMAL LOW (ref 13.0–17.0)
MCH: 30 pg (ref 26.0–34.0)
MCHC: 29.7 g/dL — ABNORMAL LOW (ref 30.0–36.0)
MCV: 101.3 fL — ABNORMAL HIGH (ref 80.0–100.0)
Platelets: 420 10*3/uL — ABNORMAL HIGH (ref 150–400)
RBC: 2.33 MIL/uL — ABNORMAL LOW (ref 4.22–5.81)
RDW: 15.5 % (ref 11.5–15.5)
WBC: 10.9 10*3/uL — ABNORMAL HIGH (ref 4.0–10.5)
nRBC: 0 % (ref 0.0–0.2)

## 2023-02-19 LAB — BASIC METABOLIC PANEL
Anion gap: 9 (ref 5–15)
Anion gap: 7 (ref 5–15)
BUN: 66 mg/dL — ABNORMAL HIGH (ref 8–23)
BUN: 63 mg/dL — ABNORMAL HIGH (ref 8–23)
CO2: 25 mmol/L (ref 22–32)
CO2: 24 mmol/L (ref 22–32)
Calcium: 8.5 mg/dL — ABNORMAL LOW (ref 8.9–10.3)
Calcium: 7.7 mg/dL — ABNORMAL LOW (ref 8.9–10.3)
Chloride: 98 mmol/L (ref 98–111)
Chloride: 100 mmol/L (ref 98–111)
Creatinine, Ser: 3.26 mg/dL — ABNORMAL HIGH (ref 0.61–1.24)
Creatinine, Ser: 3.29 mg/dL — ABNORMAL HIGH (ref 0.61–1.24)
GFR, Estimated: 19 mL/min — ABNORMAL LOW (ref >60)
GFR, Estimated: 19 mL/min — ABNORMAL LOW (ref >60)
Glucose, Bld: 138 mg/dL — ABNORMAL HIGH (ref 70–99)
Glucose, Bld: 214 mg/dL — ABNORMAL HIGH (ref 70–99)
Potassium: 6.1 mmol/L — ABNORMAL HIGH (ref 3.5–5.1)
Potassium: 4.8 mmol/L (ref 3.5–5.1)
Sodium: 132 mmol/L — ABNORMAL LOW (ref 135–145)
Sodium: 131 mmol/L — ABNORMAL LOW (ref 135–145)

## 2023-02-19 LAB — POC OCCULT BLOOD, ED

## 2023-02-19 MED ORDER — ALBUTEROL SULFATE (2.5 MG/3ML) 0.083% IN NEBU
15.0000 mg | INHALATION_SOLUTION | Freq: Once | RESPIRATORY_TRACT | Status: AC
Start: 2023-02-19 — End: 2023-02-19
  Administered 2023-02-19: 15 mg via RESPIRATORY_TRACT
  Filled 2023-02-19 (×2): qty 18

## 2023-02-19 MED ORDER — SODIUM CHLORIDE 0.9 % IV BOLUS
1000.0000 mL | Freq: Once | INTRAVENOUS | Status: AC
  Administered 2023-02-19: 1000 mL via INTRAVENOUS

## 2023-02-19 MED ORDER — ALBUTEROL SULFATE HFA 108 (90 BASE) MCG/ACT IN AERS: 2.0000 | INHALATION_SPRAY | Freq: Four times a day (QID) | RESPIRATORY_TRACT | Status: DC | PRN

## 2023-02-19 MED ORDER — BISACODYL 5 MG PO TBEC: 5.0000 mg | DELAYED_RELEASE_TABLET | Freq: Every day | ORAL | Status: DC | PRN

## 2023-02-19 MED ORDER — INSULIN ASPART 100 UNIT/ML IV SOLN
5.0000 [IU] | Freq: Once | INTRAVENOUS | Status: AC
  Administered 2023-02-19: 5 [IU] via INTRAVENOUS

## 2023-02-19 MED ORDER — DEXTROSE 50 % IV SOLN
1.0000 | Freq: Once | INTRAVENOUS | Status: AC
  Administered 2023-02-19: 50 mL via INTRAVENOUS
  Filled 2023-02-19: qty 50

## 2023-02-19 MED ORDER — CHLORHEXIDINE GLUCONATE CLOTH 2 % EX PADS
6.0000 | MEDICATED_PAD | Freq: Every day | CUTANEOUS | Status: DC
  Administered 2023-02-19 – 2023-02-23 (×5): 6 via TOPICAL

## 2023-02-19 MED ORDER — SODIUM CHLORIDE 0.9 % IV BOLUS
500.0000 mL | Freq: Once | INTRAVENOUS | Status: AC
  Administered 2023-02-19: 500 mL via INTRAVENOUS

## 2023-02-19 MED ORDER — PANTOPRAZOLE SODIUM 40 MG IV SOLR
40.0000 mg | INTRAVENOUS | Status: AC
  Administered 2023-02-19 (×2): 40 mg via INTRAVENOUS
  Filled 2023-02-19 (×2): qty 10

## 2023-02-19 MED ORDER — FUROSEMIDE 10 MG/ML IJ SOLN
40.0000 mg | Freq: Once | INTRAMUSCULAR | Status: AC
  Administered 2023-02-19: 40 mg via INTRAVENOUS
  Filled 2023-02-19: qty 4

## 2023-02-19 MED ORDER — SODIUM ZIRCONIUM CYCLOSILICATE 5 G PO PACK
10.0000 g | PACK | ORAL | Status: AC
  Administered 2023-02-19: 10 g via ORAL
  Filled 2023-02-19: qty 2

## 2023-02-19 MED ORDER — ATORVASTATIN CALCIUM 20 MG PO TABS
20.0000 mg | ORAL_TABLET | Freq: Every day | ORAL | Status: DC
Start: 2023-02-20 — End: 2023-02-20
  Administered 2023-02-20: 20 mg via ORAL
  Filled 2023-02-19: qty 1

## 2023-02-19 MED ORDER — ONDANSETRON HCL 4 MG PO TABS
4.0000 mg | ORAL_TABLET | Freq: Four times a day (QID) | ORAL | Status: DC | PRN
  Administered 2023-02-23: 4 mg via ORAL
  Filled 2023-02-19: qty 1

## 2023-02-19 MED ORDER — ONDANSETRON HCL 4 MG/2ML IJ SOLN: 4.0000 mg | Freq: Four times a day (QID) | INTRAMUSCULAR | Status: DC | PRN

## 2023-02-19 MED ORDER — PANTOPRAZOLE SODIUM 40 MG IV SOLR
40.0000 mg | Freq: Two times a day (BID) | INTRAVENOUS | Status: DC
  Administered 2023-02-20 – 2023-02-23 (×8): 40 mg via INTRAVENOUS
  Filled 2023-02-19 (×8): qty 10

## 2023-02-19 MED ORDER — SODIUM CHLORIDE 0.9 % IV SOLN
1.0000 g | Freq: Once | INTRAVENOUS | Status: AC
  Administered 2023-02-19: 1 g via INTRAVENOUS
  Filled 2023-02-19: qty 10

## 2023-02-19 MED ORDER — DEXTROSE 10 % IV SOLN: Freq: Once | INTRAVENOUS | Status: AC

## 2023-02-19 MED ORDER — ALBUTEROL SULFATE (2.5 MG/3ML) 0.083% IN NEBU: 2.5000 mg | INHALATION_SOLUTION | Freq: Four times a day (QID) | RESPIRATORY_TRACT | Status: DC | PRN

## 2023-02-19 MED ORDER — ACETAMINOPHEN 325 MG PO TABS
650.0000 mg | ORAL_TABLET | Freq: Four times a day (QID) | ORAL | Status: DC | PRN
  Administered 2023-02-20 – 2023-02-21 (×3): 650 mg via ORAL
  Filled 2023-02-19 (×3): qty 2

## 2023-02-19 MED ORDER — POLYETHYLENE GLYCOL 3350 17 G PO PACK
17.0000 g | PACK | Freq: Every day | ORAL | Status: DC
  Administered 2023-02-19 – 2023-02-22 (×3): 17 g via ORAL
  Filled 2023-02-19 (×3): qty 1

## 2023-02-19 MED ORDER — ACETAMINOPHEN 650 MG RE SUPP: 650.0000 mg | Freq: Four times a day (QID) | RECTAL | Status: DC | PRN

## 2023-02-19 MED ORDER — LACTATED RINGERS IV SOLN: INTRAVENOUS | Status: AC

## 2023-02-19 MED ORDER — HEPARIN SODIUM (PORCINE) 5000 UNIT/ML IJ SOLN
5000.0000 [IU] | Freq: Three times a day (TID) | INTRAMUSCULAR | Status: DC
  Administered 2023-02-19 – 2023-02-20 (×2): 5000 [IU] via SUBCUTANEOUS
  Filled 2023-02-19 (×2): qty 1

## 2023-02-19 NOTE — ED Notes (Signed)
 Pt. placed in mittens.

## 2023-02-19 NOTE — ED Triage Notes (Signed)
 Pt arrived via POV from home c/o large amount of blood clots, incontinence, possible UTI, decreased appetite, and is on 3L nasal cannula at baseline.

## 2023-02-19 NOTE — ED Notes (Addendum)
 Bladder scanner revealed 491 mL of urine in bladder post void--MD made aware

## 2023-02-19 NOTE — ED Provider Notes (Signed)
 [place] EMERGENCY DEPARTMENT AT [facility] Provider Note   CSN: [id number] Arrival date & time: 02/19/23  9083     History  Chief Complaint  Patient presents with   Rectal Bleeding    James Miles is a 78 y.o. male.  78 year old male with history of COPD on 4 L home oxygen, hyperlipidemia, diabetes, and rectal cancer status postradiation and chemo, and bladder mass status post resection who presents emergency department with rectal bleeding.  History obtained predominantly from the patient's son he says that over the past week and a half has been having grape/red jelly stools.  Says his father is felt gradually weaker.  Also has been treated for UTI with ciprofloxacin  but says that it has not been working and they have tried several antibiotics for it.  Says his father's been confused recently and is now so fatigued that he cannot get around like he used to.  No abdominal surgeries.  No nausea or vomiting.  Has only been having 1 stool per day.       Home Medications Prior to Admission medications   Medication Sig Start Date End Date Taking? Authorizing Provider  albuterol  (VENTOLIN  HFA) 108 (90 Base) MCG/ACT inhaler Inhale 2 puffs into the lungs every 6 (six) hours as needed for wheezing or shortness of breath. 03/29/19  Yes [provider]  atorvastatin  (LIPITOR) 20 MG tablet Take 20 mg by mouth daily. 03/29/19  Yes [provider]  capecitabine  (XELODA ) 500 MG tablet Take 3 tablets (1,500 mg total) by mouth 2 (two) times daily after a meal. Take Monday-Friday. Take only on days of radiation. 12/26/22  Yes Kandala, Hyndavi, MD  empagliflozin  (JARDIANCE ) 25 MG TABS tablet Take 25 mg by mouth daily.   Yes [provider]  febuxostat  (ULORIC ) 40 MG tablet Take 1 tablet (40 mg total) by mouth daily. 11/06/22  Yes Katragadda, Sreedhar, MD  fenofibrate 54 MG tablet Take 54 mg by mouth daily.   Yes [provider]  loperamide  (IMODIUM ) 2 MG  capsule Take 2 mg by mouth 3 (three) times daily as needed for diarrhea or loose stools.   Yes [provider]  MELATONIN PO Take 1 tablet by mouth at bedtime as needed (sleep).   Yes [provider]  traMADol  (ULTRAM ) 50 MG tablet Take 50 mg by mouth every 6 (six) hours as needed for moderate pain (pain score 4-6). 02/05/23  Yes [provider]  Misc. Devices MISC Please provide with shower chair 08/07/22   Rogers Hai, MD      Allergies    Patient has no known allergies.    Review of Systems   Review of Systems  Physical Exam Updated Vital Signs BP 111/68   Pulse 73   Temp 98.1 F (36.7 C) (Oral)   Resp 17   Ht 5' 11 (1.803 m)   Wt 74.4 kg   SpO2 100%   BMI 22.88 kg/m  Physical Exam Vitals and nursing note reviewed.  Constitutional:      General: He is not in acute distress.    Appearance: He is well-developed.     Comments: On 4 L nasal cannula  HENT:     Head: Normocephalic and atraumatic.     Right Ear: External ear normal.     Left Ear: External ear normal.     Nose: Nose normal.  Eyes:     Extraocular Movements: Extraocular movements intact.     Conjunctiva/sclera: Conjunctivae normal.  Pupils: Pupils are equal, round, and reactive to light.  Cardiovascular:     Rate and Rhythm: Normal rate and regular rhythm.     Heart sounds: Normal heart sounds.  Pulmonary:     Effort: Pulmonary effort is normal. No respiratory distress.     Breath sounds: Normal breath sounds.  Abdominal:     General: There is no distension.     Palpations: Abdomen is soft. There is no mass.     Tenderness: There is no abdominal tenderness. There is no guarding.  Genitourinary:    Comments: Chaperoned by patient's Alexandro Quarry.  Mass at the anal verge noted.  Mucousy stool without any blood or melena noted.  Hemoccult positive.  Musculoskeletal:     Cervical back: Normal range of motion and neck supple.     Right lower leg: No edema.     Left lower  leg: No edema.  Skin:    General: Skin is warm and dry.  Neurological:     Mental Status: He is alert. Mental status is at baseline.  Psychiatric:        Mood and Affect: Mood normal.        Behavior: Behavior normal.     ED Results / Procedures / Treatments   Labs (all labs ordered are listed, but only abnormal results are displayed) Labs Reviewed  CBC WITH DIFFERENTIAL/PLATELET - Abnormal; Notable for the following components:      Result Value   WBC 14.6 (*)    RBC 2.56 (*)    Hemoglobin 7.6 (*)    HCT 25.7 (*)    MCV 100.4 (*)    MCHC 29.6 (*)    Platelets 463 (*)    Neutro Abs 11.8 (*)    Abs Immature Granulocytes 0.10 (*)    All other components within normal limits  BASIC METABOLIC PANEL - Abnormal; Notable for the following components:   Sodium 132 (*)    Potassium 6.1 (*)    Glucose, Bld 138 (*)    BUN 66 (*)    Creatinine, Ser 3.26 (*)    Calcium  8.5 (*)    GFR, Estimated 19 (*)    All other components within normal limits  URINALYSIS, ROUTINE W REFLEX MICROSCOPIC - Abnormal; Notable for the following components:   APPearance CLOUDY (*)    Glucose, UA 150 (*)    Hgb urine dipstick LARGE (*)    Protein, ur 100 (*)    Leukocytes,Ua LARGE (*)    Bacteria, UA RARE (*)    All other components within normal limits  LACTIC ACID, PLASMA - Abnormal; Notable for the following components:   Lactic Acid, Venous 3.7 (*)    All other components within normal limits  LACTIC ACID, PLASMA - Abnormal; Notable for the following components:   Lactic Acid, Venous 2.8 (*)    All other components within normal limits  BASIC METABOLIC PANEL - Abnormal; Notable for the following components:   Sodium 131 (*)    Glucose, Bld 214 (*)    BUN 63 (*)    Creatinine, Ser 3.29 (*)    Calcium  7.7 (*)    GFR, Estimated 19 (*)    All other components within normal limits  CBC - Abnormal; Notable for the following components:   WBC 10.9 (*)    RBC 2.33 (*)    Hemoglobin 7.0 (*)     HCT 23.6 (*)    MCV 101.3 (*)    MCHC 29.7 (*)  Platelets 420 (*)    All other components within normal limits  BASIC METABOLIC PANEL - Abnormal; Notable for the following components:   Sodium 132 (*)    Potassium 6.1 (*)    BUN 60 (*)    Creatinine, Ser 3.18 (*)    Calcium  7.8 (*)    GFR, Estimated 19 (*)    All other components within normal limits  CBC - Abnormal; Notable for the following components:   RBC 2.01 (*)    Hemoglobin 6.6 (*)    HCT 20.3 (*)    MCV 101.0 (*)    Platelets 450 (*)    All other components within normal limits  HEMOGLOBIN AND HEMATOCRIT, BLOOD - Abnormal; Notable for the following components:   Hemoglobin 6.1 (*)    HCT 19.6 (*)    All other components within normal limits  POTASSIUM - Abnormal; Notable for the following components:   Potassium 5.9 (*)    All other components within normal limits  HEPATIC FUNCTION PANEL - Abnormal; Notable for the following components:   Total Protein 5.7 (*)    Albumin 2.3 (*)    AST 14 (*)    All other components within normal limits  HEMOGLOBIN AND HEMATOCRIT, BLOOD - Abnormal; Notable for the following components:   Hemoglobin 9.1 (*)    HCT 27.9 (*)    All other components within normal limits  BASIC METABOLIC PANEL - Abnormal; Notable for the following components:   Potassium 6.1 (*)    BUN 55 (*)    Creatinine, Ser 3.47 (*)    Calcium  8.0 (*)    GFR, Estimated 18 (*)    All other components within normal limits  BLOOD GAS, VENOUS - Abnormal; Notable for the following components:   pO2, Ven 47 (*)    Bicarbonate 29.1 (*)    Acid-Base Excess 3.8 (*)    All other components within normal limits  URINE CULTURE  CULTURE, BLOOD (ROUTINE X 2)  CULTURE, BLOOD (ROUTINE X 2)  MRSA NEXT GEN BY PCR, NASAL  GLUCOSE, CAPILLARY  GLUCOSE, CAPILLARY  AMMONIA  VITAMIN B12  TSH  POC OCCULT BLOOD, ED  TYPE AND SCREEN  PREPARE RBC (CROSSMATCH)  TYPE AND SCREEN  PREPARE RBC (CROSSMATCH)    EKG EKG  Interpretation Date/Time:  Monday February 19 2023 16:39:59 EST Ventricular Rate:  71 PR Interval:  182 QRS Duration:  106 QT Interval:  410 QTC Calculation: 446 R Axis:   71  Text Interpretation: Sinus rhythm Atrial premature complex No acute changes Nonspecific ST and T wave abnormality Confirmed by Charlyn Sora [phone]) on 02/20/2023 9:50:16 AM  Radiology CT ABDOMEN PELVIS WO CONTRAST Result Date: 02/20/2023 CLINICAL DATA:  Acute abdominal pain. Nonlocalized generalized abdominal tenderness. Rectal bleeding. Hematuria. EXAM: CT ABDOMEN AND PELVIS WITHOUT CONTRAST TECHNIQUE: Multidetector CT imaging of the abdomen and pelvis was performed following the standard protocol without IV contrast. RADIATION DOSE REDUCTION: This exam was performed according to the departmental dose-optimization program which includes automated exposure control, adjustment of the mA and/or kV according to patient size and/or use of iterative reconstruction technique. COMPARISON:  05/09/2022 FINDINGS: Lower chest: Mild patchy density at the dependent right lower lobe could be atelectasis or mild right base pneumonia. Hepatobiliary: Liver parenchyma appears normal without contrast. No calcified gallstones. Pancreas: Normal Spleen: Normal Adrenals/Urinary Tract: Thick-walled bladder. Some air in the bladder, presumably secondary to recent catheterization. Probable cystitis. This is resulting in a degree of ureteral obstruction with dilated renal collecting  systems and ureters on both sides. Nonobstructing 2 mm stone in the right kidney. No evidence of passing stone. No renal parenchymal mass. Stomach/Bowel: Stomach and small intestine are normal. There is diverticulosis of the sigmoid colon but no evidence of diverticulitis. One could question mild wall thickening of the rectum that could go along with mild proctitis. This is not definite. Vascular/Lymphatic: Aortic atherosclerosis. No aneurysm. IVC is normal. No adenopathy.  Reproductive: Normal Other: No free fluid or air. Musculoskeletal: Ordinary lower lumbar degenerative changes and osteoarthritis of the hips. IMPRESSION: 1. Thick-walled bladder. Some air in the bladder, presumably secondary to recent catheterization. Probable cystitis. This is resulting in a degree of ureteral obstruction with dilated renal collecting systems and ureters on both sides. Nonobstructing 2 mm stone in the right kidney. No evidence of passing stone. 2. Diverticulosis of the sigmoid colon without evidence of diverticulitis. One could question mild wall thickening of the rectum that could go along with mild proctitis. This is not definite. 3. Mild patchy density at the dependent right lower lobe could be atelectasis or mild right base pneumonia. Aortic Atherosclerosis (ICD10-I70.0). Electronically Signed   By: Oneil Officer M.D.   On: 02/20/2023 16:20   US  RENAL Result Date: 02/20/2023 CLINICAL DATA:  Acute kidney injury. EXAM: RENAL / URINARY TRACT ULTRASOUND COMPLETE COMPARISON:  CT scan 05/09/2022. FINDINGS: Right Kidney: Renal measurements: 12.4 x 6.0 x 5.9 cm = volume: 229 mL. Moderate right renal collecting system dilatation. No perinephric fluid. Preserved parenchyma. Left Kidney: Renal measurements: 12.6 x 6.2 x 5.2 cm = volume: 214.5 mL. Moderate collecting system dilatation. No perinephric fluid. Preserved parenchyma. Bladder: Distended bladder with significant wall thickening measuring up to 11 mm. Prevoid volume of 294 cc. Other: Evaluation somewhat limited by patient has been difficulty with procedure as per the sonographer. IMPRESSION: Distended urinary bladder with a significant wall thickening. There is also moderate bilateral renal collecting system dilatation. Would recommend repeat study after decompression of the bladder to see if this persists or additional cross-sectional imaging workup with a CT when clinically appropriate Electronically Signed   By: Ranell Bring M.D.   On:  02/20/2023 11:04   DG Chest 2 View Result Date: 02/19/2023 CLINICAL DATA:  Shortness of breath EXAM: CHEST - 2 VIEW COMPARISON:  08/02/2022 FINDINGS: Stable positioning of right chest port. The heart size and mediastinal contours are within normal limits. Hyperinflated lungs. No focal airspace consolidation, pleural effusion, or pneumothorax. The visualized skeletal structures are unremarkable. IMPRESSION: No active cardiopulmonary disease. Electronically Signed   By: Mabel Converse D.O.   On: 02/19/2023 11:03    Procedures Procedures    Medications Ordered in ED Medications  pantoprazole  (PROTONIX ) injection 40 mg (40 mg Intravenous Given 02/19/23 1728)    Followed by  pantoprazole  (PROTONIX ) injection 40 mg (40 mg Intravenous Given 02/20/23 2146)  acetaminophen  (TYLENOL ) tablet 650 mg (650 mg Oral Given 02/20/23 2315)    Or  acetaminophen  (TYLENOL ) suppository 650 mg ( Rectal See Alternative 02/20/23 2315)  polyethylene glycol (MIRALAX  / GLYCOLAX ) packet 17 g (17 g Oral Given 02/20/23 0830)  bisacodyl  (DULCOLAX) EC tablet 5 mg (has no administration in time range)  ondansetron  (ZOFRAN ) tablet 4 mg (has no administration in time range)    Or  ondansetron  (ZOFRAN ) injection 4 mg (has no administration in time range)  lactated ringers  infusion (0 mLs Intravenous Stopped 02/20/23 1045)  Chlorhexidine  Gluconate Cloth 2 % PADS 6 each (6 each Topical Given 02/20/23 0535)  albuterol  (PROVENTIL ) (2.5 MG/3ML)  0.083% nebulizer solution 2.5 mg (has no administration in time range)  Oral care mouth rinse (15 mLs Mouth Rinse Given 02/20/23 2215)  Oral care mouth rinse (has no administration in time range)  sodium zirconium cyclosilicate  (LOKELMA ) packet 10 g (10 g Oral Given 02/20/23 2146)  sodium bicarbonate  150 mEq in sterile water  1,150 mL infusion ( Intravenous New Bag/Given 02/20/23 2146)  tamsulosin  (FLOMAX ) capsule 0.4 mg (has no administration in time range)  sodium chloride  0.9 % bolus 1,000 mL  (0 mLs Intravenous Stopped 02/19/23 1732)  sodium zirconium cyclosilicate  (LOKELMA ) packet 10 g (10 g Oral Given 02/19/23 1634)  albuterol  (PROVENTIL ) (2.5 MG/3ML) 0.083% nebulizer solution 15 mg (15 mg Nebulization Given 02/19/23 1616)  insulin  aspart (novoLOG ) injection 5 Units (5 Units Intravenous Given 02/19/23 1626)    And  dextrose  50 % solution 50 mL (50 mLs Intravenous Given 02/19/23 1625)  dextrose  10 % infusion (0 mLs Intravenous Stopped 02/19/23 1915)  furosemide  (LASIX ) injection 40 mg (40 mg Intravenous Given 02/19/23 1627)  cefTRIAXone  (ROCEPHIN ) 1 g in sodium chloride  0.9 % 100 mL IVPB (0 g Intravenous Stopped 02/19/23 1732)  sodium chloride  0.9 % bolus 500 mL (0 mLs Intravenous Stopped 02/19/23 1732)  0.9 %  sodium chloride  infusion (Manually program via Guardrails IV Fluids) (0 mLs Intravenous Stopped 02/20/23 1154)  acetaminophen  (OFIRMEV ) IV 1,000 mg (0 mg Intravenous Stopped 02/20/23 1208)  furosemide  (LASIX ) injection 40 mg (40 mg Intravenous Given 02/20/23 1410)  0.9 %  sodium chloride  infusion (Manually program via Guardrails IV Fluids) (0 mLs Intravenous Stopped 02/20/23 1412)    ED Course/ Medical Decision Making/ A&P Clinical Course as of 02/20/23 2340  Mon Feb 19, 2023  1647 Dr Cindie from GI consulted and recommends keeping the patient on a clear liquid diet for possible flex sigmoidoscopy.  Says that he will need to have his kidney injury and potassium addressed prior to any procedures by them though.  [RP]  1717 Dr Lenon from [facility] hospitalist to admit the patient. [RP]  1759 Post void bladder scanner revealed 491. Foley catheter ordered.  [RP]    Clinical Course User Index [RP] Yolande Lamar BROCKS, MD                                 Medical Decision Making Amount and/or Complexity of Data Reviewed Labs: ordered. Radiology: ordered.  Risk OTC drugs. Prescription drug management. Decision regarding hospitalization.   James MAYSONET is a 78 y.o. male with  comorbidities that complicate the patient evaluation including COPD on 4 L home oxygen, hyperlipidemia, diabetes, and rectal cancer status postradiation and chemo, and bladder mass status post resection who presents emergency department with rectal bleeding.   Initial Ddx:  Malignancy, upper GI bleed, peptic ulcer, lower GI bleed, diverticulosis, malignancy, AVM, hemorrhoids, variceal bleed, symptomatic anemia  MDM:  Concerned that the patient may have a lower GI bleed based on their symptoms.  Suspect that this is likely due to his rectal mass.  Will go ahead and start the patient on Protonix  and consult GI.  Did not appear to be having symptomatic anemia at this time.  Low concern for variceal bleed given the patient's history.  Plan:  Labs Type and screen Hemoccult Protonix  GI consult  ED Summary/Re-evaluation:  Patient's labs returned which showed a slow decline in his hemoglobin.  Today is at 7.6 but his baseline is normal approximately a month ago.  He  remained hemodynamically stable while in the emergency department.  Labs also showed that he had an AKI with an elevated potassium and so he was given IV fluids and shifted.  Did not have EKG changes.  Discussed with GI as well as hospitalist who will admit at [facility].  Was also found to be retaining urine so Foley catheter was placed.  This patient presents to the ED for concern of complaints listed in HPI, this involves an extensive number of treatment options, and is a complaint that carries with it a high risk of complications and morbidity. Disposition including potential need for admission considered.   Dispo: Admit to Floor  Additional history obtained from son Records reviewed Outpatient Clinic Notes The following labs were independently interpreted: Chemistry and show  AKI and hyperkalemia I personally reviewed and interpreted the pt's EKG: see above for interpretation  I have reviewed the patients home medications and made  adjustments as needed Consults: Gastroenterology and Hospitalist Social Determinants of health:  Elderly  CRITICAL CARE Performed by: Lamar JAYSON Shan   Total critical care time: 30 minutes  Critical care time was exclusive of separately billable procedures and treating other patients.  Critical care was necessary to treat or prevent imminent or life-threatening deterioration.  Critical care was time spent personally by me on the following activities: development of treatment plan with patient and/or surrogate as well as nursing, discussions with consultants, evaluation of patient's response to treatment, examination of patient, obtaining history from patient or surrogate, ordering and performing treatments and interventions, ordering and review of laboratory studies, ordering and review of radiographic studies, pulse oximetry and re-evaluation of patient's condition.  Final Clinical Impression(s) / ED Diagnoses Final diagnoses:  Gastrointestinal hemorrhage associated with anorectal source  Anemia, unspecified type  Hyperkalemia  AKI (acute kidney injury) [facility])    Rx / DC Orders ED Discharge Orders     None         Shan Lamar JAYSON, MD 02/20/23 2340

## 2023-02-19 NOTE — ED Notes (Signed)
 Pt has been changed and put into a gown. Pt has a malewick at this time. Call light in reach and bed locked and lowered

## 2023-02-19 NOTE — ED Notes (Signed)
 Per lab they had to send type and screen to [facility] for antibody testing and if pt needed urgent transfusion he needed emergency release blood.

## 2023-02-19 NOTE — H&P (Signed)
 History and Physical    James Miles FMW:[id number] DOB: [birth year] DOA: 02/19/2023 PCP: Roni [name]  Chief Complaint: blood in stool Historian: patient  HPI:  James Miles is a 78 y.o. male with a PMH significant for COPD on 4L baseline, per patient, gout, rectal adenocarcinoma- currently undergoing chemo and radiation, afib RVR, type II DM, SVT, bladder cancer. At baseline, they live at home and are independent for ADLs. He states that he uses a walker to ambulate.   They presented from home to the ED on 02/19/2023 with what he says is a UTI. Had been treated outpatient for this. Also endorses that he had blood in his BM. States this is the first time this has occurred. According to chart review, this happens intermittently. Information provided by patient, unfortunately, is not accurate so most of history discovered by chart review. Latest heme/onc note 02/13/23 reviewed.  He denies abdominal pain, rectal pain, nausea, vomiting. He denies CP, SOB. He states that his baseline oxygen use is 4L [place].   In the ED, it was found that they had stable vital signs, hemodynamically stable on his home 4L.  Significant findings included: hgb 7.6 (down from 10.5 on 1/7), WBC 14.6, platelets 463. Na+ 132, K+ 6.1, Cr 3.26 (normal baseline 3 weeks ago and has progressively worsened over the past week).   They were initially treated with albuterol , insulin  + dextrose , lasix , protonix , miralax , lokelma .   Patient was admitted to medicine service for further workup and management of GI bleed as outlined in detail below.  Assessment/Plan Principal Problem:   GI bleed   GI bleed- presumably related to Stage II (T3 N0 M0) low rectal adenocarcinoma, MMR preserved. Has experienced intermittent bleeding x6 months. Chemoradiation began 12/28/2022.  - GI consulted and recommended treatment of acute problems and then consider possible sigmoidoscopy.  - monitor CBC. Transfuse hgb <7.  - type and  screen - continue protonix   AKI- Cr 3.26 (normal baseline 3 weeks ago and has progressively worsened over the past week).  - BMP am - avoid nephrotoxic agents - consider nephrology consult if worsens or if UOP lessens   Hyperkalemia- K+ 6.1 on presentation related to renal function decrease. S/p insulin , albuterol  and lokelma . Repeat was 4.8 - follow BMP  UTI- diagnosed outpatient and treated with cipro . Denies urinary symptoms currently. Endorsed pelvic pain prior to arrival. TURBT on 11/16/2022: Acute on chronic follicular cystitis and florid cystitis cystica negative for carcinoma   Afib- s/p ablation. Rate controlled. Not on anticoagulation   COPD- extensive smoking history. Chronically on 4L [place] - continue home respiratory therapies and O2  Past Medical History:  Diagnosis Date   Back pain    COPD (chronic obstructive pulmonary disease) (HCC)    Diabetes mellitus without complication (HCC)    Dyspnea    Dysrhythmia    Hyperlipidemia    Oxygen dependent    4L when needed    Past Surgical History:  Procedure Laterality Date   A-FLUTTER ABLATION N/A 08/23/2022   Procedure: A-FLUTTER ABLATION;  Surgeon: [name], MD;  [facility];  Service: Cardiovascular;  Laterality: N/A;   BIOPSY  04/26/2022   Procedure: BIOPSY;  Surgeon: Shaaron Lamar HERO, MD;  [facility]: [facility];  Service: Endoscopy;;   BLADDER INSTILLATION N/A 11/16/2022   Procedure: BLADDER INSTILLATION- Gemcitibine;  Surgeon: Sherrilee Belvie CROME, MD;  Location: [facility];  Service: Urology;  Laterality: N/A;   COLONOSCOPY WITH PROPOFOL  N/A 04/26/2022  Procedure: COLONOSCOPY WITH PROPOFOL ;  Surgeon: Shaaron Lamar HERO, MD;  [facility]: [facility];  Service: Endoscopy;  Laterality: N/A;  10:00am; ASA 3   CYSTOSCOPY N/A 11/16/2022   Procedure: CYSTOSCOPY;  Surgeon: Sherrilee Belvie CROME, MD;  Location: [facility];  Service: Urology;  Laterality: N/A;   hemorhoidectomy     IR IMAGING GUIDED PORT INSERTION   05/12/2022   POLYPECTOMY  04/26/2022   Procedure: POLYPECTOMY;  Surgeon: Shaaron Lamar HERO, MD;  [facility];  Service: Endoscopy;;   TRANSURETHRAL RESECTION OF BLADDER TUMOR N/A 11/16/2022   Procedure: TRANSURETHRAL RESECTION OF BLADDER TUMOR (TURBT);  Surgeon: Sherrilee Belvie CROME, MD;  Location: [facility];  Service: Urology;  Laterality: N/A;     reports that he quit smoking about 6 years ago. His smoking use included cigarettes. He started smoking about 56 years ago. He has never used smokeless tobacco. He reports that he does not drink alcohol and does not use drugs.  No Known Allergies  History reviewed. No pertinent family history.  Prior to Admission medications   Medication Sig Start Date End Date Taking? Authorizing Provider  acidophilus (RISAQUAD) CAPS capsule Take 1 capsule by mouth daily.    [provider]  albuterol  (VENTOLIN  HFA) 108 (90 Base) MCG/ACT inhaler Inhale 2 puffs into the lungs every 6 (six) hours as needed for wheezing or shortness of breath. 03/29/19   [provider]  atorvastatin  (LIPITOR) 20 MG tablet Take 20 mg by mouth daily. 03/29/19   [provider]  capecitabine  (XELODA ) 500 MG tablet Take 3 tablets (1,500 mg total) by mouth 2 (two) times daily after a meal. Take Monday-Friday. Take only on days of radiation. 12/26/22   Kandala, Hyndavi, MD  ciprofloxacin  (CIPRO ) 500 MG tablet Take 500 mg by mouth 2 (two) times daily. 02/08/23 02/21/23  [provider]  dextrose  5 % SOLN 1,000 mL with fluorouracil  5 GM/100ML SOLN Inject into the vein.    [provider]  empagliflozin  (JARDIANCE ) 25 MG TABS tablet Take 25 mg by mouth daily.    [provider]  febuxostat  (ULORIC ) 40 MG tablet Take 1 tablet (40 mg total) by mouth daily. 11/06/22   Katragadda, Sreedhar, MD  fenofibrate 54 MG tablet Take 54 mg by mouth daily.    [provider]  HYDROcodone -acetaminophen  (NORCO) 5-325 MG tablet Take 1 tablet by  mouth every 6 (six) hours as needed for moderate pain. 11/16/22   McKenzie, Belvie CROME, MD  lidocaine -prilocaine  (EMLA ) cream Apply 1 Application topically as needed. 05/17/22   Rogers Hai, MD  loperamide  (IMODIUM ) 2 MG capsule Take 2 mg by mouth 3 (three) times daily as needed for diarrhea or loose stools.    [provider]  MELATONIN PO Take 1 tablet by mouth at bedtime as needed (sleep).    [provider]  Misc. Devices MISC Please provide with shower chair 08/07/22   Rogers Hai, MD  phenazopyridine  (PYRIDIUM ) 100 MG tablet Take 1 tablet (100 mg total) by mouth 3 (three) times daily as needed for pain. 01/25/23   Rogers Hai, MD  prochlorperazine  (COMPAZINE ) 10 MG tablet Take 1 tablet (10 mg total) by mouth every 6 (six) hours as needed for nausea or vomiting. 05/17/22   Rogers Hai, MD  traMADol  (ULTRAM ) 50 MG tablet Take 50 mg by mouth every 6 (six) hours as needed. 02/05/23   [provider]   I have personally, briefly reviewed patient's prior medical records in [facility]  Objective:  Blood pressure 128/69, pulse 70, temperature 97.9 F (36.6 C), temperature source Oral, resp. rate 14, height 5' 11 (1.803 m), weight 72 kg, SpO2 100%.   Constitutional: NAD, calm, comfortable HEENT: lids and conjunctivae normal. MMM. Posterior pharynx clear of any exudate or lesions. Normal dentition.  Neck: normal, supple, no masses, no thyromegaly Respiratory: CTAB, no wheezing, no crackles. Normal respiratory effort. No accessory muscle use.  Cardiovascular: RRR, no murmurs / rubs / gallops. No extremity edema. 2+ pedal pulses. no clubbing / cyanosis.  Abdomen: soft, NT, ND, no masses or HSM palpated. GU: external cath in place and leaking urine all over the bed as well as obstructed so the collection bag is full of urine.  No blood or abnormalities observed on external exam of rectum.  Musculoskeletal: No joint deformity upper and  lower extremities. Normal muscle tone.  Skin: dry, intact, normal color, normal temperature on exposed skin Neurologic: Alert and oriented x 3. Normal speech. Grossly non-focal exam. PERRL Psychiatric: Normal mood. Congruent affect.  Labs on Admission: I have personally reviewed admission labs and imaging studies  CBC    Component Value Date/Time   WBC 14.6 (H) 02/19/2023 1036   RBC 2.56 (L) 02/19/2023 1036   HGB 7.6 (L) 02/19/2023 1036   HCT 25.7 (L) 02/19/2023 1036   PLT 463 (H) 02/19/2023 1036   MCV 100.4 (H) 02/19/2023 1036   MCH 29.7 02/19/2023 1036   MCHC 29.6 (L) 02/19/2023 1036   RDW 15.1 02/19/2023 1036   LYMPHSABS 1.9 02/19/2023 1036   MONOABS 0.7 02/19/2023 1036   EOSABS 0.1 02/19/2023 1036   BASOSABS 0.0 02/19/2023 1036   CMP     Component Value Date/Time   NA 132 (L) 02/19/2023 1036   NA 142 10/03/2018 0000   K 6.1 (H) 02/19/2023 1036   CL 98 02/19/2023 1036   CO2 25 02/19/2023 1036   GLUCOSE 138 (H) 02/19/2023 1036   BUN 66 (H) 02/19/2023 1036   BUN 19 10/03/2018 0000   CREATININE 3.26 (H) 02/19/2023 1036   CREATININE 0.73 04/10/2019 0857   CALCIUM  8.5 (L) 02/19/2023 1036   PROT 7.1 02/13/2023 1257   ALBUMIN 3.0 (L) 02/13/2023 1257   AST 21 02/13/2023 1257   ALT 19 02/13/2023 1257   ALKPHOS 80 02/13/2023 1257   BILITOT 0.8 02/13/2023 1257   GFRNONAA 19 (L) 02/19/2023 1036   GFRNONAA 93 04/10/2019 0857   GFRAA 107 04/10/2019 0857    Radiological Exams on Admission: DG Chest 2 View Result Date: 02/19/2023 CLINICAL DATA:  Shortness of breath EXAM: CHEST - 2 VIEW COMPARISON:  08/02/2022 FINDINGS: Stable positioning of right chest port. The heart size and mediastinal contours are within normal limits. Hyperinflated lungs. No focal airspace consolidation, pleural effusion, or pneumothorax. The visualized skeletal structures are unremarkable. IMPRESSION: No active cardiopulmonary disease. Electronically Signed   By: Mabel Converse D.O.   On: 02/19/2023  11:03    EKG: Independently reviewed. NSR  DVT prophylaxis: heparin  injection 5,000 Units Start: 02/19/23 2200   Code Status: full  Family Communication: none at bedside  Disposition Plan: admit to stepdown  Consults called: GI   Marien LITTIE Piety, DO [facility]  02/19/2023, 6:03 PM    To contact the appropriate [facility] Attending or Consulting provider: Check [email or web address] for coverage from 7pm-7am

## 2023-02-20 ENCOUNTER — Inpatient Hospital Stay (HOSPITAL_COMMUNITY): Payer: PPO

## 2023-02-20 DIAGNOSIS — R1084 Generalized abdominal pain: Secondary | ICD-10-CM | POA: Diagnosis not present

## 2023-02-20 DIAGNOSIS — D62 Acute posthemorrhagic anemia: Secondary | ICD-10-CM | POA: Diagnosis not present

## 2023-02-20 DIAGNOSIS — R4182 Altered mental status, unspecified: Secondary | ICD-10-CM

## 2023-02-20 DIAGNOSIS — R319 Hematuria, unspecified: Secondary | ICD-10-CM | POA: Diagnosis not present

## 2023-02-20 DIAGNOSIS — L8990 Pressure ulcer of unspecified site, unspecified stage: Secondary | ICD-10-CM | POA: Insufficient documentation

## 2023-02-20 DIAGNOSIS — D649 Anemia, unspecified: Secondary | ICD-10-CM | POA: Diagnosis not present

## 2023-02-20 DIAGNOSIS — K625 Hemorrhage of anus and rectum: Secondary | ICD-10-CM | POA: Diagnosis not present

## 2023-02-20 LAB — MRSA NEXT GEN BY PCR, NASAL: MRSA by PCR Next Gen: NOT DETECTED

## 2023-02-20 LAB — BASIC METABOLIC PANEL
Anion gap: 7 (ref 5–15)
Anion gap: 10 (ref 5–15)
BUN: 60 mg/dL — ABNORMAL HIGH (ref 8–23)
BUN: 55 mg/dL — ABNORMAL HIGH (ref 8–23)
CO2: 25 mmol/L (ref 22–32)
CO2: 24 mmol/L (ref 22–32)
Calcium: 7.8 mg/dL — ABNORMAL LOW (ref 8.9–10.3)
Calcium: 8 mg/dL — ABNORMAL LOW (ref 8.9–10.3)
Chloride: 100 mmol/L (ref 98–111)
Chloride: 102 mmol/L (ref 98–111)
Creatinine, Ser: 3.18 mg/dL — ABNORMAL HIGH (ref 0.61–1.24)
Creatinine, Ser: 3.47 mg/dL — ABNORMAL HIGH (ref 0.61–1.24)
GFR, Estimated: 19 mL/min — ABNORMAL LOW (ref >60)
GFR, Estimated: 18 mL/min — ABNORMAL LOW (ref >60)
Glucose, Bld: 86 mg/dL (ref 70–99)
Glucose, Bld: 91 mg/dL (ref 70–99)
Potassium: 6.1 mmol/L — ABNORMAL HIGH (ref 3.5–5.1)
Potassium: 6.1 mmol/L — ABNORMAL HIGH (ref 3.5–5.1)
Sodium: 132 mmol/L — ABNORMAL LOW (ref 135–145)
Sodium: 136 mmol/L (ref 135–145)

## 2023-02-20 LAB — AMMONIA: Ammonia: 19 umol/L (ref 9–35)

## 2023-02-20 LAB — CBC
HCT: 20.3 % — ABNORMAL LOW (ref 39.0–52.0)
Hemoglobin: 6.6 g/dL — CL (ref 13.0–17.0)
MCH: 32.8 pg (ref 26.0–34.0)
MCHC: 32.5 g/dL (ref 30.0–36.0)
MCV: 101 fL — ABNORMAL HIGH (ref 80.0–100.0)
Platelets: 450 10*3/uL — ABNORMAL HIGH (ref 150–400)
RBC: 2.01 MIL/uL — ABNORMAL LOW (ref 4.22–5.81)
RDW: 15.4 % (ref 11.5–15.5)
WBC: 10.1 10*3/uL (ref 4.0–10.5)
nRBC: 0 % (ref 0.0–0.2)

## 2023-02-20 LAB — PREPARE RBC (CROSSMATCH)

## 2023-02-20 LAB — HEPATIC FUNCTION PANEL
ALT: 15 U/L (ref 0–44)
AST: 14 U/L — ABNORMAL LOW (ref 15–41)
Albumin: 2.3 g/dL — ABNORMAL LOW (ref 3.5–5.0)
Alkaline Phosphatase: 54 U/L (ref 38–126)
Bilirubin, Direct: 0.1 mg/dL (ref 0.0–0.2)
Indirect Bilirubin: 0.8 mg/dL (ref 0.3–0.9)
Total Bilirubin: 0.9 mg/dL (ref 0.0–1.2)
Total Protein: 5.7 g/dL — ABNORMAL LOW (ref 6.5–8.1)

## 2023-02-20 LAB — GLUCOSE, CAPILLARY
Glucose-Capillary: 94 mg/dL (ref 70–99)
Glucose-Capillary: 94 mg/dL (ref 70–99)

## 2023-02-20 LAB — TYPE AND SCREEN
ABO/RH(D): A NEG
Antibody Screen: NEGATIVE

## 2023-02-20 LAB — URINE CULTURE: Culture: NO GROWTH

## 2023-02-20 LAB — HEMOGLOBIN AND HEMATOCRIT, BLOOD
HCT: 19.6 % — ABNORMAL LOW (ref 39.0–52.0)
HCT: 27.9 % — ABNORMAL LOW (ref 39.0–52.0)
Hemoglobin: 6.1 g/dL — CL (ref 13.0–17.0)
Hemoglobin: 9.1 g/dL — ABNORMAL LOW (ref 13.0–17.0)

## 2023-02-20 LAB — BLOOD GAS, VENOUS
Acid-Base Excess: 3.8 mmol/L — ABNORMAL HIGH (ref 0.0–2.0)
Bicarbonate: 29.1 mmol/L — ABNORMAL HIGH (ref 20.0–28.0)
Drawn by: 69418
O2 Saturation: 82.7 %
Patient temperature: 37.2
pCO2, Ven: 47 mm[Hg] (ref 44–60)
pH, Ven: 7.4 (ref 7.25–7.43)
pO2, Ven: 47 mm[Hg] — ABNORMAL HIGH (ref 32–45)

## 2023-02-20 LAB — POTASSIUM: Potassium: 5.9 mmol/L — ABNORMAL HIGH (ref 3.5–5.1)

## 2023-02-20 LAB — TSH: TSH: 1.117 u[IU]/mL (ref 0.350–4.500)

## 2023-02-20 LAB — VITAMIN B12: Vitamin B-12: 830 pg/mL (ref 180–914)

## 2023-02-20 MED ORDER — SODIUM ZIRCONIUM CYCLOSILICATE 10 G PO PACK
10.0000 g | PACK | Freq: Two times a day (BID) | ORAL | Status: DC
  Administered 2023-02-20: 10 g via ORAL
  Filled 2023-02-20: qty 1

## 2023-02-20 MED ORDER — ACETAMINOPHEN 10 MG/ML IV SOLN
1000.0000 mg | Freq: Once | INTRAVENOUS | Status: AC
  Administered 2023-02-20: 1000 mg via INTRAVENOUS
  Filled 2023-02-20 (×3): qty 100

## 2023-02-20 MED ORDER — SODIUM CHLORIDE 0.9% IV SOLUTION: Freq: Once | INTRAVENOUS | Status: AC

## 2023-02-20 MED ORDER — SODIUM BICARBONATE 8.4 % IV SOLN
INTRAVENOUS | Status: AC
  Filled 2023-02-20: qty 150

## 2023-02-20 MED ORDER — STERILE WATER FOR INJECTION IV SOLN
INTRAVENOUS | Status: DC
  Filled 2023-02-20: qty 1000

## 2023-02-20 MED ORDER — TAMSULOSIN HCL 0.4 MG PO CAPS
0.4000 mg | ORAL_CAPSULE | Freq: Every day | ORAL | Status: DC
  Administered 2023-02-21 – 2023-02-22 (×2): 0.4 mg via ORAL
  Filled 2023-02-20 (×3): qty 1

## 2023-02-20 MED ORDER — ORAL CARE MOUTH RINSE
15.0000 mL | OROMUCOSAL | Status: DC
  Administered 2023-02-20 – 2023-02-23 (×13): 15 mL via OROMUCOSAL

## 2023-02-20 MED ORDER — SODIUM ZIRCONIUM CYCLOSILICATE 10 G PO PACK: 10.0000 g | PACK | Freq: Three times a day (TID) | ORAL | Status: DC

## 2023-02-20 MED ORDER — SODIUM ZIRCONIUM CYCLOSILICATE 10 G PO PACK
10.0000 g | PACK | Freq: Three times a day (TID) | ORAL | Status: DC
  Administered 2023-02-20 (×2): 10 g via ORAL
  Filled 2023-02-20 (×2): qty 1

## 2023-02-20 MED ORDER — FUROSEMIDE 10 MG/ML IJ SOLN
40.0000 mg | Freq: Once | INTRAMUSCULAR | Status: AC
  Administered 2023-02-20: 40 mg via INTRAVENOUS
  Filled 2023-02-20: qty 4

## 2023-02-20 MED ORDER — ORAL CARE MOUTH RINSE: 15.0000 mL | OROMUCOSAL | Status: DC | PRN

## 2023-02-20 NOTE — Consult Note (Signed)
 @LOGO @   Referring Provider: Yolande Lamar BROCKS, MD Primary Care Physician:  Roni, [facility] Primary Gastroenterologist:  Dr. Shaaron  Date of Admission: 02/19/23 Date of Consultation: 02/20/23  Reason for Consultation:  Rectal bleeding  HPI:  James Miles is a 78 y.o. year old male with history of COPD on 4 L at baseline, gout, atrial fibrillation, diabetes, SVT, bladder cancer, rectal adenocarcinoma currently undergoing chemo and radiation, who presented to the ER on 1/13 with concerns for UTI and also rectal bleeding.  ED course: Hemodynamically stable.  O2 saturation stable on home 4 L. Labs remarkable for: hgb 7.6 (down from 10.5 on 1/7), WBC 14.6, platelets 463. Na+ 132, K+ 6.1, Cr 3.26 (normal baseline 3 weeks ago and has progressively worsened over the past week).    Received: Albuterol , insulin  + dextrose , lasix , protonix , miralax , lokelma .   He has a negative medicine for further workup and GI consulted for rectal bleeding.  He received additional IV fluids (LR), started on pantoprazole  IV twice daily.   Labs this morning with sodium 136, potassium 6.1, creatinine 3.18, calcium  7.8, BUN 60, hemoglobin 6.6>>6.1.  1 unit PRBCs has been ordered.   Today: Unable to obtain any history from patient today. He is altered.   Nurse states patient has been altered since moving up to the floor. States the lab tech who came to draw his blood told her, patient's mental status seems worse compared to when he presented. No reports of overt GI bleeding since presenting to the ER. Had brown stool overnight. Has hematuria. Clots present yesterday, but this seems to be slowing down.    Last colonoscopy 04/26/2022: Diverticulosis in the descending colon, ascending colon polyp removed, bulky rectal neoplasm biopsied.  Ascending colon polyp showed polypoid colonic mucosa with mild hyperplastic changes.  Rectal mass biopsy showed invasive moderately differentiated  adenocarcinoma.    Past Medical History:  Diagnosis Date   Back pain    COPD (chronic obstructive pulmonary disease) (HCC)    Diabetes mellitus without complication (HCC)    Dyspnea    Dysrhythmia    Hyperlipidemia    Oxygen dependent    4L when needed    Past Surgical History:  Procedure Laterality Date   A-FLUTTER ABLATION N/A 08/23/2022   Procedure: A-FLUTTER ABLATION;  Surgeon: [name], MD;  [facility];  Service: Cardiovascular;  Laterality: N/A;   BIOPSY  04/26/2022   Procedure: BIOPSY;  Surgeon: Shaaron Lamar HERO, MD;  [facility];  Service: Endoscopy;;   BLADDER INSTILLATION N/A 11/16/2022   Procedure: BLADDER INSTILLATION- Gemcitibine;  Surgeon: Sherrilee Belvie CROME, MD;  Location: [facility];  Service: Urology;  Laterality: N/A;   COLONOSCOPY WITH PROPOFOL  N/A 04/26/2022   Procedure: COLONOSCOPY WITH PROPOFOL ;  Surgeon: Shaaron Lamar HERO, MD;  [facility];  Service: Endoscopy;  Laterality: N/A;  10:00am; ASA 3   CYSTOSCOPY N/A 11/16/2022   Procedure: CYSTOSCOPY;  Surgeon: Sherrilee Belvie CROME, MD;  Location: [facility];  Service: Urology;  Laterality: N/A;   hemorhoidectomy     IR IMAGING GUIDED PORT INSERTION  05/12/2022   POLYPECTOMY  04/26/2022   Procedure: POLYPECTOMY;  Surgeon: Shaaron Lamar HERO, MD;  [facility];  Service: Endoscopy;;   TRANSURETHRAL RESECTION OF BLADDER TUMOR N/A 11/16/2022   Procedure: TRANSURETHRAL RESECTION OF BLADDER TUMOR (TURBT);  Surgeon: Sherrilee Belvie CROME, MD;  Location: [facility];  Service: Urology;  Laterality: N/A;    Prior to Admission medications  Medication Sig Start Date End Date Taking? Authorizing Provider  albuterol  (VENTOLIN  HFA) 108 (90 Base) MCG/ACT inhaler Inhale 2 puffs into the lungs every 6 (six) hours as needed for wheezing or shortness of breath. 03/29/19  Yes [provider]  atorvastatin  (LIPITOR) 20 MG tablet Take 20 mg by mouth daily. 03/29/19  Yes [provider]  capecitabine  (XELODA ) 500 MG tablet Take 3 tablets (1,500 mg total) by mouth 2 (two) times daily after a meal. Take Monday-Friday. Take only on days of radiation. 12/26/22  Yes Kandala, Hyndavi, MD  empagliflozin  (JARDIANCE ) 25 MG TABS tablet Take 25 mg by mouth daily.   Yes [provider]  febuxostat  (ULORIC ) 40 MG tablet Take 1 tablet (40 mg total) by mouth daily. 11/06/22  Yes Katragadda, Sreedhar, MD  fenofibrate 54 MG tablet Take 54 mg by mouth daily.   Yes [provider]  loperamide  (IMODIUM ) 2 MG capsule Take 2 mg by mouth 3 (three) times daily as needed for diarrhea or loose stools.   Yes [provider]  MELATONIN PO Take 1 tablet by mouth at bedtime as needed (sleep).   Yes [provider]  traMADol  (ULTRAM ) 50 MG tablet Take 50 mg by mouth every 6 (six) hours as needed for moderate pain (pain score 4-6). 02/05/23  Yes [provider]  Misc. Devices MISC Please provide with shower chair 08/07/22   Rogers Hai, MD    Current Facility-Administered Medications  Medication Dose Route Frequency Provider Last Rate Last Admin   0.9 %  sodium chloride  infusion (Manually program via Guardrails IV Fluids)   Intravenous Once Perri DELENA Meliton Mickey., MD       acetaminophen  (TYLENOL ) tablet 650 mg  650 mg Oral Q6H PRN Lenon Marien CROME, MD   650 mg at 02/20/23 0141   Or   acetaminophen  (TYLENOL ) suppository 650 mg  650 mg Rectal Q6H PRN Lenon Marien CROME, MD       albuterol  (PROVENTIL ) (2.5 MG/3ML) 0.083% nebulizer solution 2.5 mg  2.5 mg Nebulization Q6H PRN Lenon Marien CROME, MD       atorvastatin  (LIPITOR) tablet 20 mg  20 mg Oral Daily Lenon Marien CROME, MD       bisacodyl  (DULCOLAX) EC tablet 5 mg  5 mg Oral Daily PRN Lenon Marien CROME, MD       Chlorhexidine  Gluconate Cloth 2 % PADS 6 each  6 each Topical Q0600 Lenon Marien CROME, MD   6 each at 02/20/23 0535   ondansetron  (ZOFRAN ) tablet 4 mg  4 mg Oral Q6H PRN  Lenon Marien CROME, MD       Or   ondansetron  (ZOFRAN ) injection 4 mg  4 mg Intravenous Q6H PRN Anderson, Chelsey L, MD       Oral care mouth rinse  15 mL Mouth Rinse 4 times per day Adefeso, Oladapo, DO       Oral care mouth rinse  15 mL Mouth Rinse PRN Adefeso, Oladapo, DO       pantoprazole  (PROTONIX ) injection 40 mg  40 mg Intravenous Q12H Anderson, Chelsey L, MD   40 mg at 02/20/23 0347   polyethylene glycol (MIRALAX  / GLYCOLAX ) packet 17 g  17 g Oral Daily Lenon Marien CROME, MD   17 g at 02/19/23 1919   sodium zirconium cyclosilicate  (LOKELMA ) packet 10 g  10 g Oral BID Perri DELENA Meliton Mickey., MD        Allergies as of 02/19/2023   (No Known Allergies)  History reviewed. No pertinent family history.  Social History   Socioeconomic History   Marital status: Widowed    Spouse name: Not on file   Number of children: 2   Years of education: Not on file   Highest education level: Not on file  Occupational History   Not on file  Tobacco Use   Smoking status: Former    Current packs/day: 0.00    Types: Cigarettes    Start date: 08/1966    Quit date: 08/2016    Years since quitting: 6.5   Smokeless tobacco: Never  Vaping Use   Vaping status: Never Used  Substance and Sexual Activity   Alcohol use: Never   Drug use: Never   Sexual activity: Not Currently  Other Topics Concern   Not on file  Social History Narrative   Not on file   Social Drivers of Health   Financial Resource Strain: Not on file  Food Insecurity: No Food Insecurity (02/19/2023)   Hunger Vital Sign    Worried About Running Out of Food in the Last Year: Never true    Ran Out of Food in the Last Year: Never true  Transportation Needs: No Transportation Needs (02/19/2023)   PRAPARE - Administrator, Civil Service (Medical): No    Lack of Transportation (Non-Medical): No  Physical Activity: Not on file  Stress: Not on file  Social Connections: Unknown (02/19/2023)   Social Connection  and Isolation Panel [NHANES]    Frequency of Communication with Friends and Family: Patient unable to answer    Frequency of Social Gatherings with Friends and Family: Patient unable to answer    Attends Religious Services: Patient unable to answer    Active Member of Clubs or Organizations: Patient unable to answer    Attends Banker Meetings: Patient unable to answer    Marital Status: Widowed  Intimate Partner Violence: Not At Risk (02/19/2023)   Humiliation, Afraid, Rape, and Kick questionnaire    Fear of Current or Ex-Partner: No    Emotionally Abused: No    Physically Abused: No    Sexually Abused: No    Review of Systems: Unable to obtain due to AMS. See HPI.   Physical Exam: Vital signs in last 24 hours: Temp:  [97.8 F (36.6 C)-99.1 F (37.3 C)] 99.1 F (37.3 C) (01/14 0414) Pulse Rate:  [69-98] 87 (01/14 0600) Resp:  [12-23] 20 (01/14 0600) BP: (92-151)/(50-110) 121/80 (01/14 0600) SpO2:  [93 %-100 %] 100 % (01/14 0600) Weight:  [72 kg-74.4 kg] 74.4 kg (01/13 2144) Last BM Date : 02/20/23 General:  Appears ill, somnolent, intermittent jerking in his arms/body, laying with eyes closed but arouses when I speak to him however he is not oriented. Unable to tell me his name.  Head:  Normocephalic and atraumatic. Eyes:  Sclera clear, no icterus.   Conjunctiva pink. Ears:  Normal auditory acuity. Lungs:  Clear throughout to auscultation.   No wheezes, crackles, or rhonchi. No acute distress. Heart:  Regular rate and rhythm; no murmurs, clicks, rubs,  or gallops. Abdomen:  Soft, non-distended, mild generalized TTP. No masses, hepatosplenomegaly or hernias noted. Normal bowel sounds, without guarding, and without rebound.   Rectal:  Deferred until flex sig.  Msk:  Symmetrical without gross deformities. Normal posture. Extremities:  With 2+ bilateral ankle/pedal edema with mild erythema.  Neurologic:  Altered. Not oriented to self etc. . Skin:  Intact without  significant lesions or rashes.  Intake/Output from previous  day: 01/13 0701 - 01/14 0700 In: 540.4 [I.V.:540.4] Out: 2050 [Urine:2050] Intake/Output this shift: No intake/output data recorded.  Lab Results: Recent Labs    02/19/23 1036 02/19/23 1824 02/20/23 0330 02/20/23 0710  WBC 14.6* 10.9* 10.1  --   HGB 7.6* 7.0* 6.6* 6.1*  HCT 25.7* 23.6* 20.3* 19.6*  PLT 463* 420* 450*  --    BMET Recent Labs    02/19/23 1036 02/19/23 1824 02/20/23 0330  NA 132* 131* 132*  K 6.1* 4.8 6.1*  CL 98 100 100  CO2 25 24 25   GLUCOSE 138* 214* 86  BUN 66* 63* 60*  CREATININE 3.26* 3.29* 3.18*  CALCIUM  8.5* 7.7* 7.8*    Studies/Results: DG Chest 2 View Result Date: 02/19/2023 CLINICAL DATA:  Shortness of breath EXAM: CHEST - 2 VIEW COMPARISON:  08/02/2022 FINDINGS: Stable positioning of right chest port. The heart size and mediastinal contours are within normal limits. Hyperinflated lungs. No focal airspace consolidation, pleural effusion, or pneumothorax. The visualized skeletal structures are unremarkable. IMPRESSION: No active cardiopulmonary disease. Electronically Signed   By: Mabel Converse D.O.   On: 02/19/2023 11:03    Impression: 78 y.o. year old male with history of COPD on 4 L at baseline, gout, atrial fibrillation, diabetes, SVT, bladder cancer, rectal adenocarcinoma currently undergoing chemo and radiation, who presented to the ER on 1/13 with concerns for UTI and also rectal bleeding, found to have AKI, hyperkalemia, and acute anemia. Also with altered mental status.   Acute anemia/rectal bleeding/hematuria:  Hgb normal 1 month ago. Started declining about 3.5 weeks ago. 10.5 on 1/7, 7.6 on admission, and down to 6.1 this morning. Son reported patient having grape/red jelly stools for the last 1.5 weeks and was heme positive on rectal exam in the ER. BUN elevated, but in the setting of AKI. No reports of melena. No overt GI  bleeding since presenting to the ER. Had a brown  stool overnight. Further Hgb decline since presenting to the ER likely driven by hemodilution. Rectal bleeding may be secondary to known rectal cancer, but unable to rule out other causes as patient is also having abdominal tenderness on exam today. Recommend transfusing patient today and obtaining CT scan. Will need to consider at least flex sig, but electrolytes will need to be corrected and Hgb at least 7. 1 unit PRBCs has been ordered by hospitalist.   Notably, patient also having hematuria. Hospitalist suspects this was secondary to foley placement.   Generalized abdominal tenderness:  Etiology unclear. Patient unable to give any information but is clearly feels uncomfortable when palpating his abdomen. Will obtain CT abdomen and pelvis without contrast in light of AKI.   AKI:  Cr normal 1 month ago. Progressively worsening since that time. Cr 3.26 on admission, 3.18 today. Management per hospitalist.  Hyperkalemia:   K remains elevated at 5.9 this morning. Management per hospitalist.   AMS: Unknown baseline, but per chart review, patient has had increasing confusion over the last week. Now disoriented, unable to tell me his first name, etc. Spoke with hospitalist who suspects this is secondary to anemia and AKI/uremia/possible UTI.  Plan: Agree with transfusing 1 unit PRBCs with close monitoring of Hgb and for overt GI bleeding.  CT A/P without contrast  Management of AKI and electrolyte disturbances per hospitalist.  Continue Protonix .  Consider flex sig once electrolytes normalized and Hgb >7.   LOS: 1 day    02/20/2023, 8:09 AM   Josette [facility], [facility]

## 2023-02-20 NOTE — Progress Notes (Signed)
   02/20/23 1303  TOC Brief Assessment  Insurance and Status Reviewed  Patient has primary care physician Yes  Home environment has been reviewed from home  Prior level of function: independent  Prior/Current Home Services No current home services  Social Drivers of Health Review SDOH reviewed no interventions necessary  Readmission risk has been reviewed Yes  Transition of care needs no transition of care needs at this time    Transition of Care Department [facility]) has reviewed patient and no TOC needs have been identified at this time. We will continue to monitor patient advancement through interdisciplinary progression rounds. If new patient transition needs arise, please place a TOC consult.

## 2023-02-20 NOTE — Progress Notes (Addendum)
 UNMATCHED BLOOD PRODUCT NOTE  Compare the patient ID on the blood tag to the patient ID on the hospital armband and Blood Bank armband. Then confirm the unit number on the blood tag matches the unit number on the blood product.  If a discrepancy is discovered return the product to blood bank immediately.   Blood Product Type: Packed Red Blood Cells  Unit #: (Found on blood product bag, begins with W) [id number]  Product Code #: (Found on blood product bag, begins with E) [id number]   Start Time: 1310  Starting Rate: 120 ml/hr  Rate increase/decreased  (if applicable):      200 ml/hr  Rate changed time (if applicable): 1325 See blood flowsheet for 15 minute VS check post blood initiation.   Stop Time: 1452 See flowsheet for post blood transfusion VS.   All Other Documentation should be documented within the Blood Admin Flowsheet per policy.

## 2023-02-20 NOTE — Plan of Care (Signed)
  Problem: Education: Goal: Knowledge of General Education information will improve Description: Including pain rating scale, medication(s)/side effects and non-pharmacologic comfort measures Outcome: Not Progressing   Problem: Health Behavior/Discharge Planning: Goal: Ability to manage health-related needs will improve Outcome: Progressing   Problem: Clinical Measurements: Goal: Ability to maintain clinical measurements within normal limits will improve Outcome: Progressing Goal: Will remain free from infection Outcome: Progressing Goal: Diagnostic test results will improve Outcome: Progressing Goal: Respiratory complications will improve Outcome: Progressing Goal: Cardiovascular complication will be avoided Outcome: Progressing   Problem: Activity: Goal: Risk for activity intolerance will decrease Outcome: Not Progressing   Problem: Nutrition: Goal: Adequate nutrition will be maintained Outcome: Not Progressing   Problem: Coping: Goal: Level of anxiety will decrease Outcome: Not Progressing   Problem: Elimination: Goal: Will not experience complications related to bowel motility Outcome: Progressing Goal: Will not experience complications related to urinary retention Outcome: Progressing   Problem: Pain Management: Goal: General experience of comfort will improve Outcome: Not Progressing   Problem: Safety: Goal: Ability to remain free from injury will improve Outcome: Not Progressing   Problem: Skin Integrity: Goal: Risk for impaired skin integrity will decrease Outcome: Not Progressing

## 2023-02-20 NOTE — Progress Notes (Addendum)
 PROGRESS NOTE    James Miles  FMW:[id number] DOB: [birth year] DOA: 02/19/2023 PCP: James Miles  Chief Complaint  Patient presents with   Rectal Bleeding    Brief Narrative:   James Miles is James Miles 78 y.o. male with James Miles PMH significant for COPD on 4L baseline, per patient, gout, rectal adenocarcinoma- currently undergoing chemo and radiation, afib RVR, type II DM, SVT, bladder cancer. At baseline, they live at home and are independent for ADLs. He states that he uses James Miles walker to ambulate.   Admitted for acute blood loss anemia.  Assessment & Plan:   Principal Problem:   GI bleed Active Problems:   Hyperkalemia  Addendum:  Urinary retention: Required foley placement for urinary retention (>400 cc).  Renal US  with moderate bilateral renal collecting system dilatation.  Recommended repeat study after decompression of bladder, would follow in 2-3 days with repeat renal US .   Hematuria: noted again by RN, consider discussion with urology.  Suspect related to foley placement/cystitis.  Hyperkalemia: s/p lasix  with blood.  continue lokelma .  Will add bicarb infusion.   CT with thick walled bladder, probable cystitis, degree of ureteral obstruction with dilated renal collecting systems and ureters on both sides.   Acute Blood Loss Anemia GI bleed Hematuria  presumably related to Stage II (T3 N0 M0) low rectal adenocarcinoma, MMR preserved. Has experienced intermittent bleeding x6 months. Chemoradiation began 12/28/2022.  - apparently had James Miles brown non bloody stool last night.  Blood in urine, suspect related to foley placement -> this appears to be clearing - I don't think it's the main cause of his anemia based on his history. - Hb has drifted down since admission (6.1 today, hemodynamically stable -> HR 80s, BP 120s systolic, but he's encephalopathic) - Hb 6.1 today (down from 7.6 on admission and down from 13.3 on 12/5) - type and [facility] testing  (they need follow up labs this AM), he'll need emergency release blood given progressive anemia, discussing with blood bank  - GI consulted and recommended treatment of acute problems and then consider possible sigmoidoscopy.  Will discuss today. - monitor CBC. Transfuse for hgb <8.  - type and screen - continue protonix    Acute Metabolic Encephalopathy Due to blood loss anemia, AKI, possible UTI Delirium precautions Additional workup as indicated  Acute Kidney Injury Hyperkalemia  - Cr 3.26 at presentation (normal baseline 3 weeks ago and has progressively worsened over the past week).  - likely related to his anemia, hemodynamically related (prerenal) - did have obstruction with bladder scan with 491 earlier this admission, s/p foley - renal ultrasound - UA with 100 mg/dl protein, >49 RBC's - hyperkalemia, with encephalopathy, not able to take lokelma  (though he took yesterday, will discuss with RN and see) - will follow EKG, repeat k, treat if persistent. - follow with transfusion  - consider nephrology consult if worsens or if UOP lessens    Urinary Tract Infection diagnosed outpatient and treated with cipro . Denies urinary symptoms currently. Endorsed pelvic pain prior to arrival. 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 Rate controlled. Not on anticoagulation    COPD- extensive smoking history. Chronically on 4L [place] - continue home respiratory therapies and O2    DVT prophylaxis: SCD Code Status: full Family Communication: son  Disposition:   Status is: Inpatient Remains inpatient appropriate because: need for continued inpatient care   Consultants:  GI  Procedures:  none  Antimicrobials:  Anti-infectives (From admission, onward)    Start     Dose/Rate Route  Frequency Ordered Stop   02/19/23 1600  cefTRIAXone  (ROCEPHIN ) 1 g in sodium chloride  0.9 % 100 mL IVPB        1 g 200 mL/hr over 30 Minutes Intravenous  Once 02/19/23 1546 02/19/23 1732       Subjective: lethargic  Objective: Vitals:   02/20/23 0400 02/20/23 0414 02/20/23 0500 02/20/23 0600  BP: 115/79  (!) 92/59 121/80  Pulse: 98  89 87  Resp: (!) 22  17 20   Temp:  99.1 F (37.3 C)    TempSrc:  Oral    SpO2: 96%  100% 100%  Weight:      Height:        Intake/Output Summary (Last 24 hours) at 02/20/2023 9185 Last data filed at 02/20/2023 9485 Gross per 24 hour  Intake 540.42 ml  Output 2050 ml  Net -1509.58 ml   Filed Weights   02/19/23 1017 02/19/23 2144  Weight: 72 kg 74.4 kg    Examination:  General exam: acute on chronically ill appearing Respiratory system: unlabored Cardiovascular system: RRR Gastrointestinal system: Abdomen is mildly distended, soft/nontender Central nervous system: lethargic, responds to voice, but doesn't meaningfully communicate - mostly moans/groans, moving all extremities Extremities: no LEE   Data Reviewed: I have personally reviewed following labs and imaging studies  CBC: Recent Labs  Lab 02/13/23 1257 02/19/23 1036 02/19/23 1824 02/20/23 0330 02/20/23 0710  WBC 10.5 14.6* 10.9* 10.1  --   NEUTROABS 8.0* 11.8*  --   --   --   HGB 10.5* 7.6* 7.0* 6.6* 6.1*  HCT 33.3* 25.7* 23.6* 20.3* 19.6*  MCV 96.0 100.4* 101.3* 101.0*  --   PLT 388 463* 420* 450*  --     Basic Metabolic Panel: Recent Labs  Lab 02/13/23 1257 02/14/23 0845 02/15/23 0801 02/19/23 1036 02/19/23 1824 02/20/23 0330  NA 136 135 137 132* 131* 132*  K 3.8 3.8 4.5 6.1* 4.8 6.1*  CL 95* 99 100 98 100 100  CO2 30 27 27 25 24 25   GLUCOSE 180* 194* 162* 138* 214* 86  BUN 64* 53* 51* 66* 63* 60*  CREATININE 2.52* 2.33* 2.21* 3.26* 3.29* 3.18*  CALCIUM  8.4* 8.1* 8.1* 8.5* 7.7* 7.8*  MG 2.6*  --   --   --   --   --     GFR: Estimated Creatinine  Clearance: 20.8 mL/min (James Miles) (by C-G formula based on SCr of 3.18 mg/dL (H)).  Liver Function Tests: Recent Labs  Lab 02/13/23 1257  AST 21  ALT 19  ALKPHOS 80  BILITOT 0.8  PROT 7.1  ALBUMIN 3.0*    CBG: Recent Labs  Lab 02/20/23 0148 02/20/23 0738  GLUCAP 94 94     Recent Results (from the past 240 hours)  Blood culture (routine x 2)     Status: None (Preliminary result)   Collection Time: 02/19/23  5:16 PM   Specimen: BLOOD  Result Value Ref Range Status   Specimen Description BLOOD LEFT ANTECUBITAL  Final   Special Requests   Final    BOTTLES DRAWN AEROBIC AND ANAEROBIC Blood Culture adequate volume   Culture   Final    NO GROWTH < 24 HOURS Performed at [facility], [address], [place], [place] [address]  Report Status PENDING  Incomplete  Blood culture (routine x 2)     Status: None (Preliminary result)   Collection Time: 02/19/23  5:20 PM   Specimen: BLOOD  Result Value Ref Range Status   Specimen Description BLOOD LEFT ANTECUBITAL  Final   Special Requests   Final    BOTTLES DRAWN AEROBIC AND ANAEROBIC Blood Culture adequate volume   Culture   Final    NO GROWTH < 24 HOURS Performed at [facility], [address], [place], [place] [address]    Report Status PENDING  Incomplete         Radiology Studies: DG Chest 2 View Result Date: 02/19/2023 CLINICAL DATA:  Shortness of breath EXAM: CHEST - 2 VIEW COMPARISON:  08/02/2022 FINDINGS: Stable positioning of right chest port. The heart size and mediastinal contours are within normal limits. Hyperinflated lungs. No focal airspace consolidation, pleural effusion, or pneumothorax. The visualized skeletal structures are unremarkable. IMPRESSION: No active cardiopulmonary disease. Electronically Signed   By: Mabel Converse D.O.   On: 02/19/2023 11:03        Scheduled Meds:  sodium chloride    Intravenous Once   atorvastatin   20 mg Oral Daily   Chlorhexidine  Gluconate Cloth  6 each Topical Q0600    mouth rinse  15 mL Mouth Rinse 4 times per day   pantoprazole  (PROTONIX ) IV  40 mg Intravenous Q12H   polyethylene glycol  17 g Oral Daily   sodium zirconium cyclosilicate   10 g Oral BID   Continuous Infusions:   LOS: 1 day    Time spent: 50 min critical care time with ABLA, acute metabolic encephalopathy, hyperkalemia, acute kidney injury.    Meliton Monte, MD [facility]   To contact the attending provider between 7A-7P or the covering provider during after hours 7P-7A, please log into the web site [email or web address] and access using universal [place] password for that web site. If you do not have the password, please call the hospital operator.  02/20/2023, 8:14 AM

## 2023-02-21 DIAGNOSIS — K921 Melena: Secondary | ICD-10-CM

## 2023-02-21 DIAGNOSIS — R918 Other nonspecific abnormal finding of lung field: Secondary | ICD-10-CM

## 2023-02-21 DIAGNOSIS — N179 Acute kidney failure, unspecified: Secondary | ICD-10-CM

## 2023-02-21 DIAGNOSIS — R935 Abnormal findings on diagnostic imaging of other abdominal regions, including retroperitoneum: Secondary | ICD-10-CM

## 2023-02-21 DIAGNOSIS — R319 Hematuria, unspecified: Secondary | ICD-10-CM

## 2023-02-21 DIAGNOSIS — D649 Anemia, unspecified: Secondary | ICD-10-CM | POA: Diagnosis not present

## 2023-02-21 DIAGNOSIS — K625 Hemorrhage of anus and rectum: Secondary | ICD-10-CM

## 2023-02-21 DIAGNOSIS — R195 Other fecal abnormalities: Secondary | ICD-10-CM

## 2023-02-21 DIAGNOSIS — E875 Hyperkalemia: Secondary | ICD-10-CM | POA: Diagnosis not present

## 2023-02-21 DIAGNOSIS — K5730 Diverticulosis of large intestine without perforation or abscess without bleeding: Secondary | ICD-10-CM

## 2023-02-21 LAB — BPAM RBC
Blood Product Expiration Date: 202502082359
Blood Product Expiration Date: 202502102359
Blood Product Expiration Date: 202502122359
ISSUE DATE / TIME: 202501140919
ISSUE DATE / TIME: 202501141251
Unit Type and Rh: 5100
Unit Type and Rh: 5100
Unit Type and Rh: 9500

## 2023-02-21 LAB — CBC
HCT: 30.2 % — ABNORMAL LOW (ref 39.0–52.0)
Hemoglobin: 9.6 g/dL — ABNORMAL LOW (ref 13.0–17.0)
MCH: 30.3 pg (ref 26.0–34.0)
MCHC: 31.8 g/dL (ref 30.0–36.0)
MCV: 95.3 fL (ref 80.0–100.0)
Platelets: 412 10*3/uL — ABNORMAL HIGH (ref 150–400)
RBC: 3.17 MIL/uL — ABNORMAL LOW (ref 4.22–5.81)
RDW: 16.1 % — ABNORMAL HIGH (ref 11.5–15.5)
WBC: 8.3 10*3/uL (ref 4.0–10.5)
nRBC: 0 % (ref 0.0–0.2)

## 2023-02-21 LAB — BASIC METABOLIC PANEL
Anion gap: 10 (ref 5–15)
BUN: 50 mg/dL — ABNORMAL HIGH (ref 8–23)
CO2: 27 mmol/L (ref 22–32)
Calcium: 8.5 mg/dL — ABNORMAL LOW (ref 8.9–10.3)
Chloride: 99 mmol/L (ref 98–111)
Creatinine, Ser: 3.13 mg/dL — ABNORMAL HIGH (ref 0.61–1.24)
GFR, Estimated: 20 mL/min — ABNORMAL LOW (ref >60)
Glucose, Bld: 91 mg/dL (ref 70–99)
Potassium: 5.2 mmol/L — ABNORMAL HIGH (ref 3.5–5.1)
Sodium: 136 mmol/L (ref 135–145)

## 2023-02-21 LAB — TYPE AND SCREEN
Antibody Screen: POSITIVE
PT AG Type: POSITIVE
Unit division: 0
Unit division: 0
Unit division: 0

## 2023-02-21 MED ORDER — SODIUM CHLORIDE 0.9 % IV SOLN
INTRAVENOUS | Status: DC
Start: 2023-02-21 — End: 2023-02-22

## 2023-02-21 MED ORDER — TRAMADOL HCL 50 MG PO TABS
100.0000 mg | ORAL_TABLET | Freq: Two times a day (BID) | ORAL | Status: DC | PRN
  Administered 2023-02-21 – 2023-02-22 (×2): 100 mg via ORAL
  Filled 2023-02-21 (×3): qty 2

## 2023-02-21 MED ORDER — TRAMADOL HCL 50 MG PO TABS
50.0000 mg | ORAL_TABLET | Freq: Two times a day (BID) | ORAL | Status: DC | PRN
  Administered 2023-02-21: 50 mg via ORAL
  Filled 2023-02-21: qty 1

## 2023-02-21 MED ORDER — ORAL CARE MOUTH RINSE: 15.0000 mL | OROMUCOSAL | Status: DC | PRN

## 2023-02-21 MED ORDER — SODIUM ZIRCONIUM CYCLOSILICATE 10 G PO PACK
10.0000 g | PACK | Freq: Three times a day (TID) | ORAL | Status: AC
  Administered 2023-02-21 (×3): 10 g via ORAL
  Filled 2023-02-21 (×3): qty 1

## 2023-02-21 MED ORDER — HYDROMORPHONE HCL 1 MG/ML IJ SOLN
0.5000 mg | INTRAMUSCULAR | Status: DC | PRN
  Administered 2023-02-22 – 2023-02-23 (×2): 0.5 mg via INTRAVENOUS
  Filled 2023-02-21 (×3): qty 0.5

## 2023-02-21 NOTE — Progress Notes (Signed)
 PT Cancellation Note  Patient Details Name: James Miles MRN: [id number] DOB: 1945-12-05   Cancelled Treatment:    Reason Eval/Treat Not Completed: Medical issues which prohibited therapy. Physical therapy held due to possible active GI bleeding per RN.   12:27 PM, 02/21/23 Walton Guppy, MPT Physical Therapist with [facility] [phone] office [phone] mobile phone

## 2023-02-21 NOTE — TOC Initial Note (Signed)
 Transition of Care [facility]) - Initial/Assessment Note    Patient Details  Name: James Miles MRN: [id number] Date of Birth: 10/09/1945  Transition of Care [facility]) CM/SW Contact:    Grandville Lax, LCSWA Phone Number: 02/21/2023, 2:11 PM  Clinical Narrative:                 Pt is high risk for readmission. CSW spoke with pt at bedside to complete assessment. Pt lives with his son. Pt is independent in completing his ADLs but his son can assist if needed. Pts son provides transport when needed. Pt has not had HH in the past and does not use a cane or walker. Pt states that he wear 4L O2 at baseline and it is supplied through [employer]. TOC to follow.   Expected Discharge Plan: Home/Self Care Barriers to Discharge: Continued Medical Work up   Patient Goals and CMS Choice Patient states their goals for this hospitalization and ongoing recovery are:: return home CMS Medicare.gov Compare Post Acute Care list provided to:: Patient Choice offered to / list presented to : Patient      Expected Discharge Plan and Services In-house Referral: Clinical Social Work Discharge Planning Services: CM Consult   Living arrangements for the past 2 months: Single Family Home                                      Prior Living Arrangements/Services Living arrangements for the past 2 months: Single Family Home Lives with:: Adult Children Patient language and need for interpreter reviewed:: Yes Do you feel safe going back to the place where you live?: Yes      Need for Family Participation in Patient Care: Yes (Comment) Care giver support system in place?: Yes (comment) Current home services: DME Criminal Activity/Legal Involvement Pertinent to Current Situation/Hospitalization: No - Comment as needed  Activities of Daily Living   ADL Screening (condition at time of admission) Independently performs ADLs?: No Does the patient have a NEW difficulty with bathing/dressing/toileting/self-feeding  that is expected to last >3 days?: No Does the patient have a NEW difficulty with getting in/out of bed, walking, or climbing stairs that is expected to last >3 days?: No Does the patient have a NEW difficulty with communication that is expected to last >3 days?: No Is the patient deaf or have difficulty hearing?: No Does the patient have difficulty seeing, even when wearing glasses/contacts?: No Does the patient have difficulty concentrating, remembering, or making decisions?: Yes  Permission Sought/Granted                  Emotional Assessment Appearance:: Appears stated age Attitude/Demeanor/Rapport: Engaged Affect (typically observed): Accepting Orientation: : Oriented to Self, Oriented to Place, Oriented to  Time, Oriented to Situation Alcohol / Substance Use: Not Applicable Psych Involvement: No (comment)  Admission diagnosis:  GI bleed [K92.2] Patient Active Problem List   Diagnosis Date Noted   Acute blood loss anemia 02/20/2023   Pressure injury of skin 02/20/2023   GI bleed 02/19/2023   Hyperkalemia 02/19/2023   Bladder tumor 11/16/2022   Atrial flutter, chronic (HCC) 08/23/2022   Current use of long term anticoagulation 08/03/2022   Tachycardia induced cardiomyopathy (HCC) 08/03/2022   Atrial fibrillation with RVR (HCC) 08/02/2022   Uncontrolled type 2 diabetes mellitus with hyperglycemia, without long-term current use of insulin  (HCC) 08/02/2022   Chronic respiratory failure with hypoxia (HCC) 08/02/2022  Hypercoagulable state due to atrial fibrillation (HCC) 07/24/2022   SVT (supraventricular tachycardia) (HCC) 07/08/2022   Atrial flutter with rapid ventricular response (HCC) 07/07/2022   Diabetes mellitus without complication (HCC) 07/07/2022   Rectal adenocarcinoma (HCC) 05/07/2022   Gout 04/07/2019   Lower extremity edema 10/08/2018   Hyperlipidemia 10/08/2018   Chronic obstructive pulmonary disease (HCC) 10/08/2018   PCP:  Berta Brittle  Clinic Pharmacy:   [facility] - [place], [place] - [address] [place] [address] Phone: [phone] Fax: [phone]  [place] [facility] [address] [place] [address] Phone: [phone] Fax: [phone]     Social Drivers of Health (SDOH) Social History: SDOH Screenings   Food Insecurity: No Food Insecurity (02/19/2023)  Housing: Unknown (02/19/2023)  Transportation Needs: No Transportation Needs (02/19/2023)  Utilities: Not At Risk (02/19/2023)  Depression (PHQ2-9): Low Risk  (05/08/2022)  Social Connections: Unknown (02/19/2023)  Tobacco Use: Medium Risk (02/19/2023)   SDOH Interventions:     Readmission Risk Interventions    02/21/2023    2:10 PM  Readmission Risk Prevention Plan  Transportation Screening Complete  HRI or Home Care Consult Complete  Social Work Consult for Recovery Care Planning/Counseling Complete  Palliative Care Screening Not Applicable  Medication Review Oceanographer) Complete

## 2023-02-21 NOTE — Progress Notes (Signed)
 Subjective: Reports pain in groin/bladder area and his back. Denies abdominal pain, nausea, vomiting. Has had 2 BMs this morning with no rectal bleeding or melena that he could see.   Objective: Vital signs in last 24 hours: Temp:  [98 F (36.7 C)-98.9 F (37.2 C)] 98 F (36.7 C) (01/15 0800) Pulse Rate:  [64-82] 76 (01/15 0800) Resp:  [11-24] 15 (01/15 0800) BP: (99-165)/(49-115) 99/60 (01/15 0800) SpO2:  [92 %-100 %] 99 % (01/15 0601) Last BM Date : 02/20/23 General:   Alert and oriented, pleasant Head:  Normocephalic and atraumatic. Eyes:  No icterus, sclera clear. Conjuctiva pink.  Mouth:  Without lesions, mucosa pink and moist.  Heart:  S1, S2 present, no murmurs noted.  Lungs: Clear to auscultation bilaterally, diminished  Abdomen:  Bowel sounds present, soft, non-tender, non-distended. No HSM or hernias noted. No rebound or guarding. No masses appreciated  Neurologic:  Alert and  oriented x4;  grossly normal neurologically. Psych:  Alert and cooperative. Normal mood and affect.  Intake/Output from previous day: 01/14 0701 - 01/15 0700 In: 1017 [P.O.:120; I.V.:422.7; Blood:380; IV Piggyback:94.3] Out: 4301 [Urine:4300; Stool:1] Intake/Output this shift: No intake/output data recorded.  Lab Results: Recent Labs    02/19/23 1824 02/20/23 0330 02/20/23 0710 02/20/23 1535 02/21/23 0510  WBC 10.9* 10.1  --   --  8.3  HGB 7.0* 6.6* 6.1* 9.1* 9.6*  HCT 23.6* 20.3* 19.6* 27.9* 30.2*  PLT 420* 450*  --   --  412*   BMET Recent Labs    02/20/23 0330 02/20/23 0845 02/20/23 1535 02/21/23 0510  NA 132*  --  136 136  K 6.1* 5.9* 6.1* 5.2*  CL 100  --  102 99  CO2 25  --  24 27  GLUCOSE 86  --  91 91  BUN 60*  --  55* 50*  CREATININE 3.18*  --  3.47* 3.13*  CALCIUM  7.8*  --  8.0* 8.5*   LFT Recent Labs    02/20/23 0845  PROT 5.7*  ALBUMIN 2.3*  AST 14*  ALT 15  ALKPHOS 54  BILITOT 0.9  BILIDIR 0.1  IBILI 0.8     Studies/Results: CT ABDOMEN PELVIS  WO CONTRAST Result Date: 02/20/2023 CLINICAL DATA:  Acute abdominal pain. Nonlocalized generalized abdominal tenderness. Rectal bleeding. Hematuria. EXAM: CT ABDOMEN AND PELVIS WITHOUT CONTRAST TECHNIQUE: Multidetector CT imaging of the abdomen and pelvis was performed following the standard protocol without IV contrast. RADIATION DOSE REDUCTION: This exam was performed according to the departmental dose-optimization program which includes automated exposure control, adjustment of the mA and/or kV according to patient size and/or use of iterative reconstruction technique. COMPARISON:  05/09/2022 FINDINGS: Lower chest: Mild patchy density at the dependent right lower lobe could be atelectasis or mild right base pneumonia. Hepatobiliary: Liver parenchyma appears normal without contrast. No calcified gallstones. Pancreas: Normal Spleen: Normal Adrenals/Urinary Tract: Thick-walled bladder. Some air in the bladder, presumably secondary to recent catheterization. Probable cystitis. This is resulting in a degree of ureteral obstruction with dilated renal collecting systems and ureters on both sides. Nonobstructing 2 mm stone in the right kidney. No evidence of passing stone. No renal parenchymal mass. Stomach/Bowel: Stomach and small intestine are normal. There is diverticulosis of the sigmoid colon but no evidence of diverticulitis. One could question mild wall thickening of the rectum that could go along with mild proctitis. This is not definite. Vascular/Lymphatic: Aortic atherosclerosis. No aneurysm. IVC is normal. No adenopathy. Reproductive: Normal Other: No free fluid or air. Musculoskeletal:  Ordinary lower lumbar degenerative changes and osteoarthritis of the hips. IMPRESSION: 1. Thick-walled bladder. Some air in the bladder, presumably secondary to recent catheterization. Probable cystitis. This is resulting in a degree of ureteral obstruction with dilated renal collecting systems and ureters on both sides.  Nonobstructing 2 mm stone in the right kidney. No evidence of passing stone. 2. Diverticulosis of the sigmoid colon without evidence of diverticulitis. One could question mild wall thickening of the rectum that could go along with mild proctitis. This is not definite. 3. Mild patchy density at the dependent right lower lobe could be atelectasis or mild right base pneumonia. Aortic Atherosclerosis (ICD10-I70.0). Electronically Signed   By: Bettylou Brunner M.D.   On: 02/20/2023 16:20   US  RENAL Result Date: 02/20/2023 CLINICAL DATA:  Acute kidney injury. EXAM: RENAL / URINARY TRACT ULTRASOUND COMPLETE COMPARISON:  CT scan 05/09/2022. FINDINGS: Right Kidney: Renal measurements: 12.4 x 6.0 x 5.9 cm = volume: 229 mL. Moderate right renal collecting system dilatation. No perinephric fluid. Preserved parenchyma. Left Kidney: Renal measurements: 12.6 x 6.2 x 5.2 cm = volume: 214.5 mL. Moderate collecting system dilatation. No perinephric fluid. Preserved parenchyma. Bladder: Distended bladder with significant wall thickening measuring up to 11 mm. Prevoid volume of 294 cc. Other: Evaluation somewhat limited by patient has been difficulty with procedure as per the sonographer. IMPRESSION: Distended urinary bladder with a significant wall thickening. There is also moderate bilateral renal collecting system dilatation. Would recommend repeat study after decompression of the bladder to see if this persists or additional cross-sectional imaging workup with a CT when clinically appropriate Electronically Signed   By: Adrianna Horde M.D.   On: 02/20/2023 11:04   DG Chest 2 View Result Date: 02/19/2023 CLINICAL DATA:  Shortness of breath EXAM: CHEST - 2 VIEW COMPARISON:  08/02/2022 FINDINGS: Stable positioning of right chest port. The heart size and mediastinal contours are within normal limits. Hyperinflated lungs. No focal airspace consolidation, pleural effusion, or pneumothorax. The visualized skeletal structures are  unremarkable. IMPRESSION: No active cardiopulmonary disease. Electronically Signed   By: Leverne Reading D.O.   On: 02/19/2023 11:03    Assessment: James Miles is a 78 year old male with history of COPD on 4 L O2 at baseline, gout, A-fib, diabetes, SVT, bladder cancer, rectal adenocarcinoma currently undergoing chemo and radiation, who presented to the ED on 1/13 with concerns for UTI and rectal bleeding, found to have AKI, hyperkalemia and acute anemia as well as altered mental status.  Acute anemia/rectal bleeding/hematuria: Hemoglobin 6.1 on admission.  Patient son had reported having grape/red jelly stools for the last 1-1/2 weeks, FOBT positive in the ED.  BUN elevated.  No reports of melena.  No further bleeding during admission, 2 BMs this morning without blood.   CT abdomen pelvis without contrast yesterday with thick-walled bladder, probable cystitis, diverticulosis in sigmoid colon without evidence of diverticulitis, question of mild wall thickening of the rectum that could go along with mild proctitis though not definitive.  Mild patchy density in the dependent right lower lobe could be atelectasis or mild right base pneumonia.  May consider flexible sigmoidoscopy if bleeding recurs/electrolytes/kidney function improve, though likely bleeding is from known tumor, therefore if no further bleeding we will monitor and hold off on endoscopic procedures at this time.  Started on MiraLAX  yesterday to maintain soft stools.  Plan: Electrolyte correction per hospitalist Trend H&H, monitor for overt GI bleeding MiraLAX  17 g daily Can consider flexible sigmoidoscopy if rectal bleeding recurs Continue supportive measures  LOS: 2 days    02/21/2023, 9:02 AM   Emmalina Espericueta L. Jeran Hiltz, MSN, APRN, AGNP-C Adult-Gerontology Nurse Practitioner [facility]

## 2023-02-21 NOTE — Progress Notes (Addendum)
 [facility] PROGRESS NOTE    Progress Note  James Miles  NWG:[id number] DOB: 02/02/1946 DOA: 02/19/2023 PCP: Bryna Car, [name]     Brief Narrative:   James Miles is an 78 y.o. male past medical history significant for COPD on 4 L of oxygen, gout, rectal adenocarcinoma on chemotherapy and radiation, paroxysmal A-fib not on anticoagulation, diabetes mellitus type 2, history of bladder cancer, came into the hospital for acute GI bleed   Assessment/Plan:   Acute blood loss anemia/hematuria/GI bleed: Likely due to presumably stage II rectal carcinoma has experienced intermittent bleeding for the past 6 months, he began radiation 12/28/2022. He also had hematuria likely due to traumatic Foley placement. On 01/15/2023 hemoglobin was 12, on admission 6.6 he has been transfused 1 unit of packed red blood cells hemoglobin this morning is 9.6. GI was consulted recommended transfusion possible sigmoidoscopy. He has been typed and screened continue Protonix .  Acute metabolic encephalopathy: Multifactorial in the setting of anemia, acute kidney injury and possible UTI. At high risk of delirium and aspiration.  Acute kidney injury: Question of a's progressing to ATN With a baseline creatinine of less than 1 about 3 weeks prior to admission, on admission 3.6. Likely hemodynamically related. Start him on aggressive IV fluid hydration try to keep him globin greater than 8. His creatinine is trending down slowly. Start him on aggressive IV fluid strict I's and O's and daily weights.  He is negative about 4 L. Recheck basic metabolic panel in the morning.  Hyperkalemia: Start on Lokelma  his potassium is trending down. Continue Lokelma  for 24 hours repeat a basic metabolic panel in the morning.  Hypovolemic hyponatremia: Resolved with IV fluids.  UTI: Diagnosis and outpatient was treated with Cipro . Denies any symptoms here. Urine cultures and blood cultures were sent  they have been negative till date. Antibiotics were stopped on 02/20/2023.  Urinary retention: Now with Foley currently on Flomax  continue bladder scan.  History of paroxysmal atrial fibrillation: Status post ablation rate control not on anticoagulation.  COPD/chronic respiratory failure with hypoxia: Continue 4 L of oxygen.   RN Pressure Injury Documentation: Pressure Injury 02/19/23 Sacrum Lower;Medial Stage 2 -  Partial thickness loss of dermis presenting as a shallow open injury with a red, pink wound bed without slough. (Active)  02/19/23 2145  Location: Sacrum  Location Orientation: Lower;Medial  Staging: Stage 2 -  Partial thickness loss of dermis presenting as a shallow open injury with a red, pink wound bed without slough.  Wound Description (Comments):   Present on Admission: Yes  Dressing Type Foam - Lift dressing to assess site every shift 02/20/23 2000      DVT prophylaxis: SCD Family Communication:none Status is: Inpatient Remains inpatient appropriate because: Lower GI bleed.    Code Status:     Code Status Orders  (From admission, onward)           Start     Ordered   02/19/23 1759  Full code  Continuous       Question:  By:  Answer:  Consent: discussion documented in EHR   02/19/23 1800           Code Status History     Date Active Date Inactive Code Status Order ID Comments User Context   08/23/2022 1839 08/24/2022 1620 Full Code [id number]  Tammie Fall, MD Inpatient   08/02/2022 1530 08/04/2022 1739 Full Code [id number]  Demaris Fillers, MD ED   07/07/2022 1948 07/10/2022 2116 Full Code  [id number]  Stinson, Jacob J, DO Inpatient   05/12/2022 1449 05/13/2022 0512 Full Code [id number]  Oval Blossom, MD HOV         IV Access:   Peripheral IV   Procedures and diagnostic studies:   CT ABDOMEN PELVIS WO CONTRAST Result Date: 02/20/2023 CLINICAL DATA:  Acute abdominal pain. Nonlocalized generalized abdominal tenderness. Rectal bleeding.  Hematuria. EXAM: CT ABDOMEN AND PELVIS WITHOUT CONTRAST TECHNIQUE: Multidetector CT imaging of the abdomen and pelvis was performed following the standard protocol without IV contrast. RADIATION DOSE REDUCTION: This exam was performed according to the departmental dose-optimization program which includes automated exposure control, adjustment of the mA and/or kV according to patient size and/or use of iterative reconstruction technique. COMPARISON:  05/09/2022 FINDINGS: Lower chest: Mild patchy density at the dependent right lower lobe could be atelectasis or mild right base pneumonia. Hepatobiliary: Liver parenchyma appears normal without contrast. No calcified gallstones. Pancreas: Normal Spleen: Normal Adrenals/Urinary Tract: Thick-walled bladder. Some air in the bladder, presumably secondary to recent catheterization. Probable cystitis. This is resulting in a degree of ureteral obstruction with dilated renal collecting systems and ureters on both sides. Nonobstructing 2 mm stone in the right kidney. No evidence of passing stone. No renal parenchymal mass. Stomach/Bowel: Stomach and small intestine are normal. There is diverticulosis of the sigmoid colon but no evidence of diverticulitis. One could question mild wall thickening of the rectum that could go along with mild proctitis. This is not definite. Vascular/Lymphatic: Aortic atherosclerosis. No aneurysm. IVC is normal. No adenopathy. Reproductive: Normal Other: No free fluid or air. Musculoskeletal: Ordinary lower lumbar degenerative changes and osteoarthritis of the hips. IMPRESSION: 1. Thick-walled bladder. Some air in the bladder, presumably secondary to recent catheterization. Probable cystitis. This is resulting in a degree of ureteral obstruction with dilated renal collecting systems and ureters on both sides. Nonobstructing 2 mm stone in the right kidney. No evidence of passing stone. 2. Diverticulosis of the sigmoid colon without evidence of  diverticulitis. One could question mild wall thickening of the rectum that could go along with mild proctitis. This is not definite. 3. Mild patchy density at the dependent right lower lobe could be atelectasis or mild right base pneumonia. Aortic Atherosclerosis (ICD10-I70.0). Electronically Signed   By: Bettylou Brunner M.D.   On: 02/20/2023 16:20   US  RENAL Result Date: 02/20/2023 CLINICAL DATA:  Acute kidney injury. EXAM: RENAL / URINARY TRACT ULTRASOUND COMPLETE COMPARISON:  CT scan 05/09/2022. FINDINGS: Right Kidney: Renal measurements: 12.4 x 6.0 x 5.9 cm = volume: 229 mL. Moderate right renal collecting system dilatation. No perinephric fluid. Preserved parenchyma. Left Kidney: Renal measurements: 12.6 x 6.2 x 5.2 cm = volume: 214.5 mL. Moderate collecting system dilatation. No perinephric fluid. Preserved parenchyma. Bladder: Distended bladder with significant wall thickening measuring up to 11 mm. Prevoid volume of 294 cc. Other: Evaluation somewhat limited by patient has been difficulty with procedure as per the sonographer. IMPRESSION: Distended urinary bladder with a significant wall thickening. There is also moderate bilateral renal collecting system dilatation. Would recommend repeat study after decompression of the bladder to see if this persists or additional cross-sectional imaging workup with a CT when clinically appropriate Electronically Signed   By: Adrianna Horde M.D.   On: 02/20/2023 11:04   DG Chest 2 View Result Date: 02/19/2023 CLINICAL DATA:  Shortness of breath EXAM: CHEST - 2 VIEW COMPARISON:  08/02/2022 FINDINGS: Stable positioning of right chest port. The heart size and mediastinal contours are within normal limits. Hyperinflated  lungs. No focal airspace consolidation, pleural effusion, or pneumothorax. The visualized skeletal structures are unremarkable. IMPRESSION: No active cardiopulmonary disease. Electronically Signed   By: Leverne Reading D.O.   On: 02/19/2023 11:03      Medical Consultants:   None.   Subjective:    Martine Sleek relates he feels about the same appetite is unchanged had a burgundy bowel movement this morning.  Objective:    Vitals:   02/21/23 0100 02/21/23 0300 02/21/23 0400 02/21/23 0601  BP: 136/70 119/67  120/73  Pulse: 70 64  66  Resp: 16 17  (!) 24  Temp:   98.6 F (37 C)   TempSrc:   Oral   SpO2: 99% 92%  99%  Weight:      Height:       SpO2: 99 % O2 Flow Rate (L/min): 4 L/min   Intake/Output Summary (Last 24 hours) at 02/21/2023 0658 Last data filed at 02/21/2023 0600 Gross per 24 hour  Intake 1017 ml  Output 4301 ml  Net -3284 ml   Filed Weights   02/19/23 1017 02/19/23 2144  Weight: 72 kg 74.4 kg    Exam: General exam: In no acute distress. Respiratory system: Good air movement and clear to auscultation. Cardiovascular system: S1 & S2 heard, RRR.  I cannot appreciate any JVD. Gastrointestinal system: Abdomen is nondistended, soft and nontender.  Extremities: No pedal edema. Skin: No rashes, lesions or ulcers Psychiatry: Judgement and insight appear normal. Mood & affect appropriate.    Data Reviewed:    Labs: Basic Metabolic Panel: Recent Labs  Lab 02/19/23 1036 02/19/23 1824 02/20/23 0330 02/20/23 0845 02/20/23 1535 02/21/23 0510  NA 132* 131* 132*  --  136 136  K 6.1* 4.8 6.1*   < > 6.1* 5.2*  CL 98 100 100  --  102 99  CO2 25 24 25   --  24 27  GLUCOSE 138* 214* 86  --  91 91  BUN 66* 63* 60*  --  55* 50*  CREATININE 3.26* 3.29* 3.18*  --  3.47* 3.13*  CALCIUM  8.5* 7.7* 7.8*  --  8.0* 8.5*   < > = values in this interval not displayed.   GFR Estimated Creatinine Clearance: 21.1 mL/min (A) (by C-G formula based on SCr of 3.13 mg/dL (H)). Liver Function Tests: Recent Labs  Lab 02/20/23 0845  AST 14*  ALT 15  ALKPHOS 54  BILITOT 0.9  PROT 5.7*  ALBUMIN 2.3*   No results for input(s): "LIPASE", "AMYLASE" in the last 168 hours. Recent Labs  Lab 02/20/23 1535   AMMONIA 19   Coagulation profile No results for input(s): "INR", "PROTIME" in the last 168 hours. COVID-19 Labs  No results for input(s): "DDIMER", "FERRITIN", "LDH", "CRP" in the last 72 hours.  No results found for: "SARSCOV2NAA"  CBC: Recent Labs  Lab 02/19/23 1036 02/19/23 1824 02/20/23 0330 02/20/23 0710 02/20/23 1535 02/21/23 0510  WBC 14.6* 10.9* 10.1  --   --  8.3  NEUTROABS 11.8*  --   --   --   --   --   HGB 7.6* 7.0* 6.6* 6.1* 9.1* 9.6*  HCT 25.7* 23.6* 20.3* 19.6* 27.9* 30.2*  MCV 100.4* 101.3* 101.0*  --   --  95.3  PLT 463* 420* 450*  --   --  412*   Cardiac Enzymes: No results for input(s): "CKTOTAL", "CKMB", "CKMBINDEX", "TROPONINI" in the last 168 hours. BNP (last 3 results) No results for input(s): "PROBNP" in the last  8760 hours. CBG: Recent Labs  Lab 02/20/23 0148 02/20/23 0738  GLUCAP 94 94   D-Dimer: No results for input(s): "DDIMER" in the last 72 hours. Hgb A1c: No results for input(s): "HGBA1C" in the last 72 hours. Lipid Profile: No results for input(s): "CHOL", "HDL", "LDLCALC", "TRIG", "CHOLHDL", "LDLDIRECT" in the last 72 hours. Thyroid  function studies: Recent Labs    02/20/23 1535  TSH 1.117   Anemia work up: Recent Labs    02/20/23 1535  VITAMINB12 830   Sepsis Labs: Recent Labs  Lab 02/19/23 1036 02/19/23 1716 02/19/23 1824 02/20/23 0330 02/21/23 0510  WBC 14.6*  --  10.9* 10.1 8.3  LATICACIDVEN  --  3.7* 2.8*  --   --    Microbiology Recent Results (from the past 240 hours)  Urine Culture     Status: None   Collection Time: 02/19/23  5:01 PM   Specimen: Urine, Clean Catch  Result Value Ref Range Status   Specimen Description   Final    URINE, CLEAN CATCH Performed at [facility], [address] [place], [place] [address]    Special Requests   Final    NONE Performed at [facility], [address] [place], [place] [address]    Culture   Final    NO GROWTH Performed at [facility], [address], [place], [place] [address]    Report Status 02/20/2023 FINAL  Final  Blood culture (routine x 2)     Status: None (Preliminary result)   Collection Time: 02/19/23  5:16 PM   Specimen: BLOOD  Result Value Ref Range Status   Specimen Description BLOOD LEFT ANTECUBITAL  Final   Special Requests   Final    BOTTLES DRAWN AEROBIC AND ANAEROBIC Blood Culture adequate volume   Culture   Final    NO GROWTH 2 DAYS Performed at [facility], [address], [place], [place] [address]    Report Status PENDING  Incomplete  Blood culture (routine x 2)     Status: None (Preliminary result)   Collection Time: 02/19/23  5:20 PM   Specimen: BLOOD  Result Value Ref Range Status   Specimen Description BLOOD LEFT ANTECUBITAL  Final   Special Requests   Final    BOTTLES DRAWN AEROBIC AND ANAEROBIC Blood Culture adequate volume   Culture   Final    NO GROWTH 2 DAYS Performed at [facility], [address] [place], [place] [address]    Report Status PENDING  Incomplete  MRSA Next Gen by PCR, Nasal     Status: None   Collection Time: 02/19/23  9:49 PM   Specimen: Nasal Mucosa; Nasal Swab  Result Value Ref Range Status   MRSA by PCR Next Gen NOT DETECTED NOT DETECTED Final    Comment: (NOTE) The GeneXpert MRSA Assay (FDA approved for NASAL specimens only), is one component of a comprehensive MRSA colonization surveillance program. It is not intended to diagnose MRSA infection nor to guide or monitor treatment for MRSA infections. Test performance is not FDA approved in patients less than 43 years old. Performed at [facility], [address], [place], [place] [address]      Medications:    Chlorhexidine  Gluconate Cloth  6 each Topical Q0600   mouth rinse  15 mL Mouth Rinse 4 times per day   pantoprazole  (PROTONIX ) IV  40 mg Intravenous Q12H   polyethylene glycol  17 g Oral Daily   sodium zirconium cyclosilicate   10 g Oral  TID   tamsulosin   0.4 mg Oral QPC supper   Continuous  Infusions:  sodium bicarbonate  150 mEq in sterile water  1,150 mL infusion 50 mL/hr at 02/21/23 0600      LOS: 2 days   Macdonald Savoy  [facility]  02/21/2023, 6:58 AM

## 2023-02-21 NOTE — Progress Notes (Signed)
 Catheter irrigated removing multiple large clots. Urine draining from catheter is lighter red, no clots at this time, and foley obstruction cleared. Will continue to monitor urine flow

## 2023-02-21 NOTE — Plan of Care (Signed)

## 2023-02-22 ENCOUNTER — Telehealth (INDEPENDENT_AMBULATORY_CARE_PROVIDER_SITE_OTHER): Payer: Self-pay | Admitting: Gastroenterology

## 2023-02-22 DIAGNOSIS — K921 Melena: Secondary | ICD-10-CM | POA: Diagnosis not present

## 2023-02-22 DIAGNOSIS — K625 Hemorrhage of anus and rectum: Secondary | ICD-10-CM | POA: Diagnosis not present

## 2023-02-22 DIAGNOSIS — R109 Unspecified abdominal pain: Secondary | ICD-10-CM | POA: Diagnosis not present

## 2023-02-22 DIAGNOSIS — K5730 Diverticulosis of large intestine without perforation or abscess without bleeding: Secondary | ICD-10-CM | POA: Diagnosis not present

## 2023-02-22 DIAGNOSIS — D649 Anemia, unspecified: Secondary | ICD-10-CM | POA: Diagnosis not present

## 2023-02-22 LAB — BASIC METABOLIC PANEL
Anion gap: 5 (ref 5–15)
BUN: 40 mg/dL — ABNORMAL HIGH (ref 8–23)
CO2: 27 mmol/L (ref 22–32)
Calcium: 8 mg/dL — ABNORMAL LOW (ref 8.9–10.3)
Chloride: 100 mmol/L (ref 98–111)
Creatinine, Ser: 2.81 mg/dL — ABNORMAL HIGH (ref 0.61–1.24)
GFR, Estimated: 23 mL/min — ABNORMAL LOW (ref >60)
Glucose, Bld: 101 mg/dL — ABNORMAL HIGH (ref 70–99)
Potassium: 4.9 mmol/L (ref 3.5–5.1)
Sodium: 132 mmol/L — ABNORMAL LOW (ref 135–145)

## 2023-02-22 LAB — CBC
HCT: 29.5 % — ABNORMAL LOW (ref 39.0–52.0)
Hemoglobin: 9.4 g/dL — ABNORMAL LOW (ref 13.0–17.0)
MCH: 30.6 pg (ref 26.0–34.0)
MCHC: 31.9 g/dL (ref 30.0–36.0)
MCV: 96.1 fL (ref 80.0–100.0)
Platelets: 378 10*3/uL (ref 150–400)
RBC: 3.07 MIL/uL — ABNORMAL LOW (ref 4.22–5.81)
RDW: 15.6 % — ABNORMAL HIGH (ref 11.5–15.5)
WBC: 7.3 10*3/uL (ref 4.0–10.5)
nRBC: 0 % (ref 0.0–0.2)

## 2023-02-22 MED ORDER — FINASTERIDE 5 MG PO TABS
5.0000 mg | ORAL_TABLET | Freq: Every day | ORAL | Status: DC
  Administered 2023-02-22 – 2023-02-23 (×2): 5 mg via ORAL
  Filled 2023-02-22 (×4): qty 1

## 2023-02-22 MED ORDER — SODIUM CHLORIDE 0.9 % IV SOLN: INTRAVENOUS | Status: DC

## 2023-02-22 NOTE — Progress Notes (Signed)
[facility] PROGRESS NOTE    Progress Note  James Miles  ZOX:[id number] DOB: 06-16-1945 DOA: 02/19/2023 PCP: Ponciano Ort, [name]     Brief Narrative:   James Miles is an 78 y.o. male past medical history significant for COPD on 4 L of oxygen, gout, rectal adenocarcinoma on chemotherapy and radiation, paroxysmal A-fib not on anticoagulation, diabetes mellitus type 2, history of bladder cancer, came into the hospital for acute GI bleed   Assessment/Plan:   Acute blood loss anemia/hematuria/GI bleed: Likely due to presumably stage II rectal carcinoma has experienced intermittent bleeding for the past 6 months, he began radiation 12/28/2022. He also had hematuria likely due to traumatic Foley placement. On 01/15/2023 hemoglobin was 12, on admission 6.6 he has been transfused 2 unit of packed red blood cells, his hemoglobin has remained stable this morning is 9.4. GI was consulted commended conservative management.    Acute metabolic encephalopathy: Multifactorial in the setting of anemia, acute kidney injury and possible UTI. At high risk of delirium and aspiration.  Acute kidney injury: Question of  progressing to ATN, likely due to prerenal azotemia With a baseline creatinine of less than 1 about 3 weeks prior to admission, on admission 3.6. Started on IV fluids, his hemoglobin slowly trending down this morning is 2.8. Recheck basic metabolic panel in the morning. I's and O's are poorly recorded.  Hyperkalemia: Start on Lokelma his potassium is trending down. Potassium is improved.  Continue to monitor intermittently.  Hypovolemic hyponatremia: Resolved with IV fluids.  UTI: Diagnosis and outpatient was treated with Cipro. Denies any symptoms here. Urine cultures and blood cultures were sent they have been negative till date. Antibiotics were stopped on 02/20/2023.  Acute urinary retention: Now with Foley currently on Flomax continue bladder  scan.  History of paroxysmal atrial fibrillation: Status post ablation rate control not on anticoagulation.  COPD/chronic respiratory failure with hypoxia: Continue 4 L of oxygen.   RN Pressure Injury Documentation: Pressure Injury 02/19/23 Sacrum Lower;Medial Stage 2 -  Partial thickness loss of dermis presenting as a shallow open injury with a red, pink wound bed without slough. (Active)  02/19/23 2145  Location: Sacrum  Location Orientation: Lower;Medial  Staging: Stage 2 -  Partial thickness loss of dermis presenting as a shallow open injury with a red, pink wound bed without slough.  Wound Description (Comments):   Present on Admission: Yes  Dressing Type Foam - Lift dressing to assess site every shift 02/21/23 1951      DVT prophylaxis: SCD Family Communication:none Status is: Inpatient Remains inpatient appropriate because: Lower GI bleed.  Transferring to MedSurg    Code Status:     Code Status Orders  (From admission, onward)           Start     Ordered   02/19/23 1759  Full code  Continuous       Question:  By:  Answer:  Consent: discussion documented in EHR   02/19/23 1800           Code Status History     Date Active Date Inactive Code Status Order ID Comments User Context   08/23/2022 1839 08/24/2022 1620 Full Code [id number]  Marinus Maw, MD Inpatient   08/02/2022 1530 08/04/2022 1739 Full Code [id number]  Catarina Hartshorn, MD ED   07/07/2022 1948 07/10/2022 2116 Full Code [id number]  Levie Heritage, DO Inpatient   05/12/2022 1449 05/13/2022 0512 Full Code [id number]  Pernell Dupre, MD HOV  IV Access:   Peripheral IV   Procedures and diagnostic studies:   CT ABDOMEN PELVIS WO CONTRAST Result Date: 02/20/2023 CLINICAL DATA:  Acute abdominal pain. Nonlocalized generalized abdominal tenderness. Rectal bleeding. Hematuria. EXAM: CT ABDOMEN AND PELVIS WITHOUT CONTRAST TECHNIQUE: Multidetector CT imaging of the abdomen and pelvis was performed  following the standard protocol without IV contrast. RADIATION DOSE REDUCTION: This exam was performed according to the departmental dose-optimization program which includes automated exposure control, adjustment of the mA and/or kV according to patient size and/or use of iterative reconstruction technique. COMPARISON:  05/09/2022 FINDINGS: Lower chest: Mild patchy density at the dependent right lower lobe could be atelectasis or mild right base pneumonia. Hepatobiliary: Liver parenchyma appears normal without contrast. No calcified gallstones. Pancreas: Normal Spleen: Normal Adrenals/Urinary Tract: Thick-walled bladder. Some air in the bladder, presumably secondary to recent catheterization. Probable cystitis. This is resulting in a degree of ureteral obstruction with dilated renal collecting systems and ureters on both sides. Nonobstructing 2 mm stone in the right kidney. No evidence of passing stone. No renal parenchymal mass. Stomach/Bowel: Stomach and small intestine are normal. There is diverticulosis of the sigmoid colon but no evidence of diverticulitis. One could question mild wall thickening of the rectum that could go along with mild proctitis. This is not definite. Vascular/Lymphatic: Aortic atherosclerosis. No aneurysm. IVC is normal. No adenopathy. Reproductive: Normal Other: No free fluid or air. Musculoskeletal: Ordinary lower lumbar degenerative changes and osteoarthritis of the hips. IMPRESSION: 1. Thick-walled bladder. Some air in the bladder, presumably secondary to recent catheterization. Probable cystitis. This is resulting in a degree of ureteral obstruction with dilated renal collecting systems and ureters on both sides. Nonobstructing 2 mm stone in the right kidney. No evidence of passing stone. 2. Diverticulosis of the sigmoid colon without evidence of diverticulitis. One could question mild wall thickening of the rectum that could go along with mild proctitis. This is not definite. 3. Mild  patchy density at the dependent right lower lobe could be atelectasis or mild right base pneumonia. Aortic Atherosclerosis (ICD10-I70.0). Electronically Signed   By: Paulina Fusi M.D.   On: 02/20/2023 16:20   US RENAL Result Date: 02/20/2023 CLINICAL DATA:  Acute kidney injury. EXAM: RENAL / URINARY TRACT ULTRASOUND COMPLETE COMPARISON:  CT scan 05/09/2022. FINDINGS: Right Kidney: Renal measurements: 12.4 x 6.0 x 5.9 cm = volume: 229 mL. Moderate right renal collecting system dilatation. No perinephric fluid. Preserved parenchyma. Left Kidney: Renal measurements: 12.6 x 6.2 x 5.2 cm = volume: 214.5 mL. Moderate collecting system dilatation. No perinephric fluid. Preserved parenchyma. Bladder: Distended bladder with significant wall thickening measuring up to 11 mm. Prevoid volume of 294 cc. Other: Evaluation somewhat limited by patient has been difficulty with procedure as per the sonographer. IMPRESSION: Distended urinary bladder with a significant wall thickening. There is also moderate bilateral renal collecting system dilatation. Would recommend repeat study after decompression of the bladder to see if this persists or additional cross-sectional imaging workup with a CT when clinically appropriate Electronically Signed   By: Karen Kays M.D.   On: 02/20/2023 11:04     Medical Consultants:   None.   Subjective:    [name] feels better.  Objective:    Vitals:   02/22/23 0400 02/22/23 0500 02/22/23 0600 02/22/23 0747  BP: 112/60 (!) 95/53 129/67   Pulse: 67 62 71   Resp: 10 (!) 21 12   Temp:    97.9 F (36.6 C)  TempSrc:    Oral  SpO2: 94% 98% 100%   Weight:      Height:       SpO2: 100 % O2 Flow Rate (L/min): 4 L/min   Intake/Output Summary (Last 24 hours) at 02/22/2023 0747 Last data filed at 02/22/2023 0000 Gross per 24 hour  Intake 1008.58 ml  Output 1650 ml  Net -641.42 ml   Filed Weights   02/19/23 1017 02/19/23 2144  Weight: 72 kg 74.4 kg     Exam: General exam: In no acute distress. Respiratory system: Good air movement and clear to auscultation. Cardiovascular system: S1 & S2 heard, RRR. No JVD. Gastrointestinal system: Abdomen is nondistended, soft and nontender.  Extremities: No pedal edema. Skin: No rashes, lesions or ulcers Psychiatry: Judgement and insight appear normal. Mood & affect appropriate.  Data Reviewed:    Labs: Basic Metabolic Panel: Recent Labs  Lab 02/19/23 1824 02/20/23 0330 02/20/23 0845 02/20/23 1535 02/21/23 0510 02/22/23 0500  NA 131* 132*  --  136 136 132*  K 4.8 6.1*   < > 6.1* 5.2* 4.9  CL 100 100  --  102 99 100  CO2 24 25  --  24 27 27   GLUCOSE 214* 86  --  91 91 101*  BUN 63* 60*  --  55* 50* 40*  CREATININE 3.29* 3.18*  --  3.47* 3.13* 2.81*  CALCIUM 7.7* 7.8*  --  8.0* 8.5* 8.0*   < > = values in this interval not displayed.   GFR Estimated Creatinine Clearance: 23.5 mL/min (A) (by C-G formula based on SCr of 2.81 mg/dL (H)). Liver Function Tests: Recent Labs  Lab 02/20/23 0845  AST 14*  ALT 15  ALKPHOS 54  BILITOT 0.9  PROT 5.7*  ALBUMIN 2.3*   No results for input(s): "LIPASE", "AMYLASE" in the last 168 hours. Recent Labs  Lab 02/20/23 1535  AMMONIA 19   Coagulation profile No results for input(s): "INR", "PROTIME" in the last 168 hours. COVID-19 Labs  No results for input(s): "DDIMER", "FERRITIN", "LDH", "CRP" in the last 72 hours.  No results found for: "SARSCOV2NAA"  CBC: Recent Labs  Lab 02/19/23 1036 02/19/23 1824 02/20/23 0330 02/20/23 0710 02/20/23 1535 02/21/23 0510 02/22/23 0500  WBC 14.6* 10.9* 10.1  --   --  8.3 7.3  NEUTROABS 11.8*  --   --   --   --   --   --   HGB 7.6* 7.0* 6.6* 6.1* 9.1* 9.6* 9.4*  HCT 25.7* 23.6* 20.3* 19.6* 27.9* 30.2* 29.5*  MCV 100.4* 101.3* 101.0*  --   --  95.3 96.1  PLT 463* 420* 450*  --   --  412* 378   Cardiac Enzymes: No results for input(s): "CKTOTAL", "CKMB", "CKMBINDEX", "TROPONINI" in the  last 168 hours. BNP (last 3 results) No results for input(s): "PROBNP" in the last 8760 hours. CBG: Recent Labs  Lab 02/20/23 0148 02/20/23 0738  GLUCAP 94 94   D-Dimer: No results for input(s): "DDIMER" in the last 72 hours. Hgb A1c: No results for input(s): "HGBA1C" in the last 72 hours. Lipid Profile: No results for input(s): "CHOL", "HDL", "LDLCALC", "TRIG", "CHOLHDL", "LDLDIRECT" in the last 72 hours. Thyroid function studies: Recent Labs    02/20/23 1535  TSH 1.117   Anemia work up: Recent Labs    02/20/23 1535  VITAMINB12 830   Sepsis Labs: Recent Labs  Lab 02/19/23 1716 02/19/23 1824 02/20/23 0330 02/21/23 0510 02/22/23 0500  WBC  --  10.9* 10.1 8.3 7.3  LATICACIDVEN 3.7*  2.8*  --   --   --    Microbiology Recent Results (from the past 240 hours)  Urine Culture     Status: None   Collection Time: 02/19/23  5:01 PM   Specimen: Urine, Clean Catch  Result Value Ref Range Status   Specimen Description   Final    URINE, CLEAN CATCH Performed at [facility], [address], [place], [place] [address]    Special Requests   Final    NONE Performed at [facility], [address], [place], [place] [address]    Culture   Final    NO GROWTH Performed at [facility], [address], [place], [place] [address]    Report Status 02/20/2023 FINAL  Final  Blood culture (routine x 2)     Status: None (Preliminary result)   Collection Time: 02/19/23  5:16 PM   Specimen: BLOOD  Result Value Ref Range Status   Specimen Description BLOOD LEFT ANTECUBITAL  Final   Special Requests   Final    BOTTLES DRAWN AEROBIC AND ANAEROBIC Blood Culture adequate volume   Culture   Final    NO GROWTH 3 DAYS Performed at [facility], [address], [place], [place] [address]    Report Status PENDING  Incomplete  Blood culture (routine x 2)     Status: None (Preliminary result)   Collection Time: 02/19/23  5:20 PM   Specimen: BLOOD  Result Value Ref Range Status    Specimen Description BLOOD LEFT ANTECUBITAL  Final   Special Requests   Final    BOTTLES DRAWN AEROBIC AND ANAEROBIC Blood Culture adequate volume   Culture   Final    NO GROWTH 3 DAYS Performed at [facility], [address] [place], [place] [address]    Report Status PENDING  Incomplete  MRSA Next Gen by PCR, Nasal     Status: None   Collection Time: 02/19/23  9:49 PM   Specimen: Nasal Mucosa; Nasal Swab  Result Value Ref Range Status   MRSA by PCR Next Gen NOT DETECTED NOT DETECTED Final    Comment: (NOTE) The GeneXpert MRSA Assay (FDA approved for NASAL specimens only), is one component of a comprehensive MRSA colonization surveillance program. It is not intended to diagnose MRSA infection nor to guide or monitor treatment for MRSA infections. Test performance is not FDA approved in patients less than 55 years old. Performed at [facility], [address], [place], [place] [address]      Medications:    Chlorhexidine Gluconate Cloth  6 each Topical Q0600   mouth rinse  15 mL Mouth Rinse 4 times per day   pantoprazole (PROTONIX) IV  40 mg Intravenous Q12H   polyethylene glycol  17 g Oral Daily   tamsulosin  0.4 mg Oral QPC supper   Continuous Infusions:  sodium chloride 75 mL/hr at 02/21/23 2126      LOS: 3 days   Marinda Elk  [facility]  02/22/2023, 7:47 AM

## 2023-02-22 NOTE — Telephone Encounter (Signed)
Hi ,  Can you please schedule a follow up appointment for this patient in 2-3 weeks with Dr. Jena Gauss or any of the APPs?  Thanks,  Katrinka Blazing, MD Gastroenterology and Hepatology [facility] Gastroenterology

## 2023-02-22 NOTE — Evaluation (Addendum)
Physical Therapy Evaluation Patient Details Name: James Miles MRN: [id number] DOB: Oct 30, 1945 Today's Date: 02/22/2023  History of Present Illness  an 78 y.o. male past medical history significant for COPD on 4 L of oxygen, gout, rectal adenocarcinoma on chemotherapy and radiation, paroxysmal A-fib not on anticoagulation, diabetes mellitus type 2, history of bladder cancer, came into the hospital for acute GI bleed  Clinical Impression   Pt tolerated today's Physical Therapy Evaluation, well. Pt's son was present and actively participating during evaluation with PLOF history and close by during functional mobility assessment. Pt showing, slow labored movement patterns with minimal assist for power to stand from seated position. Pt's son reports that he can manage and assist pt as needed  at home. Pt's son educated on use of RW and other DME along with [facility] PT services, pt and son agreeable to plan. Pt demonstrating limitations in transfers, ambulation, and ADLs due to significant muscle weakness, decreased activity tolerance, and mild balance deficits.  Vitals as follows: Supine 131/60 Seated: 119/72 Standing: 129/64  Based upon these deficits/impairments, patient will benefit from continued skilled physical therapy services during remainder of hospital stay and at the next recommended venue of care to address deficits and promote return to optimal function.                If plan is discharge home, recommend the following: A little help with walking and/or transfers;A little help with bathing/dressing/bathroom   Can travel by private vehicle        Equipment Recommendations Rolling walker (2 wheels);None recommended by PT  Recommendations for Other Services       Functional Status Assessment Patient has had a recent decline in their functional status and demonstrates the ability to make significant improvements in function in a reasonable and predictable amount of time.      Precautions / Restrictions Restrictions Weight Bearing Restrictions Per Provider Order: No      Mobility  Bed Mobility Overal bed mobility: Needs Assistance Bed Mobility: Supine to Sit     Supine to sit: Contact guard     General bed mobility comments: from flat HOB, no physical assist- SBA Patient Response: Cooperative  Transfers Overall transfer level: Needs assistance Equipment used: Rolling walker (2 wheels) Transfers: Sit to/from Stand Sit to Stand: Min assist           General transfer comment: slow, labored, pulling on RW, power to stand with min assist. Pt given cues and education on proper RW management.    Ambulation/Gait Ambulation/Gait assistance: Contact guard assist Gait Distance (Feet): 7 Feet Assistive device: Rolling walker (2 wheels) Gait Pattern/deviations: Step-through pattern, Decreased step length - right, Decreased step length - left, Decreased dorsiflexion - left, Decreased dorsiflexion - right, Shuffle       General Gait Details: slow, labored gait pattern with RW, steady with turning. CGA for steadying and line/lead management.  Stairs            Wheelchair Mobility     Tilt Bed Tilt Bed Patient Response: Cooperative  Modified Rankin (Stroke Patients Only)       Balance Overall balance assessment: Needs assistance Sitting-balance support: No upper extremity supported, Feet supported Sitting balance-Leahy Scale: Good Sitting balance - Comments: sitting EOB and in recliner   Standing balance support: Bilateral upper extremity supported, During functional activity, Reliant on assistive device for balance Standing balance-Leahy Scale: Fair Standing balance comment: at EOB while taking BP. No swaying.  Pertinent Vitals/Pain Pain Assessment Pain Assessment: No/denies pain    Home Living Family/patient expects to be discharged to:: Private residence Living Arrangements:  Children Available Help at Discharge: Family;Available 24 hours/day Type of Home: House Home Access: Level entry       Home Layout: One level Home Equipment: Grab bars - tub/shower;Tub bench      Prior Function               Mobility Comments: household and short distanced community ambulator without AD ADLs Comments: Independent. Son performing- cooking, cleaning, laundry     Extremity/Trunk Assessment        Lower Extremity Assessment Lower Extremity Assessment: Generalized weakness;RLE deficits/detail;LLE deficits/detail RLE Deficits / Details: 4-/5 knee extension and ankle dorsiflexion RLE Sensation: WNL LLE Deficits / Details: 4-/5 knee extension and ankle dorsiflexion LLE Sensation: WNL    Cervical / Trunk Assessment Cervical / Trunk Assessment: Kyphotic  Communication   Communication Communication: No apparent difficulties Cueing Techniques: Verbal cues;Tactile cues  Cognition Arousal: Alert Behavior During Therapy: WFL for tasks assessed/performed Overall Cognitive Status: Within Functional Limits for tasks assessed                                          General Comments      Exercises     Assessment/Plan    PT Assessment Patient needs continued PT services  PT Problem List Decreased strength;Decreased activity tolerance;Decreased balance;Decreased mobility       PT Treatment Interventions DME instruction;Gait training;Functional mobility training;Therapeutic activities;Therapeutic exercise;Balance training;Neuromuscular re-education    PT Goals (Current goals can be found in the Care Plan section)  Acute Rehab PT Goals Patient Stated Goal: return home with son to assist PT Goal Formulation: With patient Time For Goal Achievement: 03/08/23 Potential to Achieve Goals: Good    Frequency Min 3X/week     Co-evaluation               AM-PAC PT "6 Clicks" Mobility  Outcome Measure Help needed turning from your back  to your side while in a flat bed without using bedrails?: A Little Help needed moving from lying on your back to sitting on the side of a flat bed without using bedrails?: A Little Help needed moving to and from a bed to a chair (including a wheelchair)?: A Little Help needed standing up from a chair using your arms (e.g., wheelchair or bedside chair)?: A Little Help needed to walk in hospital room?: A Little Help needed climbing 3-5 steps with a railing? : A Lot 6 Click Score: 17    End of Session Equipment Utilized During Treatment: Gait belt Activity Tolerance: Patient tolerated treatment well Patient left: in chair;with call bell/phone within reach;with family/visitor present Nurse Communication: Mobility status PT Visit Diagnosis: Unsteadiness on feet (R26.81);Muscle weakness (generalized) (M62.81)    Time: 6962-9528 PT Time Calculation (min) (ACUTE ONLY): 38 min   Charges:   PT Evaluation $PT Eval Moderate Complexity: 1 Mod PT Treatments $Therapeutic Activity: 8-22 mins PT General Charges $$ ACUTE PT VISIT: 1 Visit         Elie Goody, DPT [facility]- [place] [address] [phone] office  Nelida Meuse 02/22/2023, 3:23 PM

## 2023-02-22 NOTE — Plan of Care (Signed)
  Problem: Acute Rehab PT Goals(only PT should resolve) Goal: Pt Will Go Supine/Side To Sit Flowsheets (Taken 02/22/2023 1529) Pt will go Supine/Side to Sit: Independently Goal: Patient Will Transfer Sit To/From Stand Flowsheets (Taken 02/22/2023 1529) Patient will transfer sit to/from stand: Independently Goal: Pt Will Perform Standing Balance Or Pre-Gait Flowsheets (Taken 02/22/2023 1529) Pt will perform standing balance or pre-gait: Independently Goal: Pt Will Ambulate Flowsheets (Taken 02/22/2023 1529) Pt will Ambulate:  50 feet  75 feet  with modified independence  with least restrictive assistive device  Nelida Meuse PT, DPT [facility]- [place] 336 [phone] office

## 2023-02-22 NOTE — Progress Notes (Signed)
Gastroenterology Progress Note   Referring Provider: No ref. provider found Primary Care Physician:  [place], [facility] Primary Gastroenterologist:  Roetta Sessions, MD Patient ID: KAULDER ANN; [id number]; 09/21/1945   Subjective:    Alert. Denies abdominal pain. Is he not sure the last time he had blood per rectum. Nursing staff reports brown stools only, no rectal bleeding. Continues with gross hematuria. Patient on clear liquid diet.   Objective:   Vital signs in last 24 hours: Temp:  [97.9 F (36.6 C)-98.1 F (36.7 C)] 97.9 F (36.6 C) (01/16 0747) Pulse Rate:  [61-100] 71 (01/16 0600) Resp:  [10-25] 12 (01/16 0600) BP: (95-165)/(50-81) 129/67 (01/16 0600) SpO2:  [94 %-100 %] 100 % (01/16 0600) Last BM Date : 02/21/23 General:   Alert, pleasant and cooperative in NAD. Frail appearing Head:  Normocephalic and atraumatic. Eyes:  Sclera clear, no icterus.  . Abdomen:  Soft, nontender and nondistended.  Normal bowel sounds, without guarding, and without rebound.   Extremities:  Without clubbing, deformity or edema.   Intake/Output from previous day: 01/15 0701 - 01/16 0700 In: 1008.6 [P.O.:240; I.V.:768.6] Out: 1650 [Urine:1650] Intake/Output this shift: No intake/output data recorded.  Lab Results: CBC Recent Labs    02/20/23 0330 02/20/23 0710 02/20/23 1535 02/21/23 0510 02/22/23 0500  WBC 10.1  --   --  8.3 7.3  HGB 6.6*   < > 9.1* 9.6* 9.4*  HCT 20.3*   < > 27.9* 30.2* 29.5*  MCV 101.0*  --   --  95.3 96.1  PLT 450*  --   --  412* 378   < > = values in this interval not displayed.   BMET Recent Labs    02/20/23 1535 02/21/23 0510 02/22/23 0500  NA 136 136 132*  K 6.1* 5.2* 4.9  CL 102 99 100  CO2 24 27 27   GLUCOSE 91 91 101*  BUN 55* 50* 40*  CREATININE 3.47* 3.13* 2.81*  CALCIUM 8.0* 8.5* 8.0*   LFTs Recent Labs    02/20/23 0845  BILITOT 0.9  BILIDIR 0.1  IBILI 0.8  ALKPHOS 54  AST 14*  ALT 15  PROT 5.7*  ALBUMIN 2.3*   No  results for input(s): "LIPASE" in the last 72 hours. PT/INR No results for input(s): "LABPROT", "INR" in the last 72 hours.       Imaging Studies: CT ABDOMEN PELVIS WO CONTRAST Result Date: 02/20/2023 CLINICAL DATA:  Acute abdominal pain. Nonlocalized generalized abdominal tenderness. Rectal bleeding. Hematuria. EXAM: CT ABDOMEN AND PELVIS WITHOUT CONTRAST TECHNIQUE: Multidetector CT imaging of the abdomen and pelvis was performed following the standard protocol without IV contrast. RADIATION DOSE REDUCTION: This exam was performed according to the departmental dose-optimization program which includes automated exposure control, adjustment of the mA and/or kV according to patient size and/or use of iterative reconstruction technique. COMPARISON:  05/09/2022 FINDINGS: Lower chest: Mild patchy density at the dependent right lower lobe could be atelectasis or mild right base pneumonia. Hepatobiliary: Liver parenchyma appears normal without contrast. No calcified gallstones. Pancreas: Normal Spleen: Normal Adrenals/Urinary Tract: Thick-walled bladder. Some air in the bladder, presumably secondary to recent catheterization. Probable cystitis. This is resulting in a degree of ureteral obstruction with dilated renal collecting systems and ureters on both sides. Nonobstructing 2 mm stone in the right kidney. No evidence of passing stone. No renal parenchymal mass. Stomach/Bowel: Stomach and small intestine are normal. There is diverticulosis of the sigmoid colon but no evidence of diverticulitis. One could question mild  wall thickening of the rectum that could go along with mild proctitis. This is not definite. Vascular/Lymphatic: Aortic atherosclerosis. No aneurysm. IVC is normal. No adenopathy. Reproductive: Normal Other: No free fluid or air. Musculoskeletal: Ordinary lower lumbar degenerative changes and osteoarthritis of the hips. IMPRESSION: 1. Thick-walled bladder. Some air in the bladder, presumably  secondary to recent catheterization. Probable cystitis. This is resulting in a degree of ureteral obstruction with dilated renal collecting systems and ureters on both sides. Nonobstructing 2 mm stone in the right kidney. No evidence of passing stone. 2. Diverticulosis of the sigmoid colon without evidence of diverticulitis. One could question mild wall thickening of the rectum that could go along with mild proctitis. This is not definite. 3. Mild patchy density at the dependent right lower lobe could be atelectasis or mild right base pneumonia. Aortic Atherosclerosis (ICD10-I70.0). Electronically Signed   By: Paulina Fusi M.D.   On: 02/20/2023 16:20   US RENAL Result Date: 02/20/2023 CLINICAL DATA:  Acute kidney injury. EXAM: RENAL / URINARY TRACT ULTRASOUND COMPLETE COMPARISON:  CT scan 05/09/2022. FINDINGS: Right Kidney: Renal measurements: 12.4 x 6.0 x 5.9 cm = volume: 229 mL. Moderate right renal collecting system dilatation. No perinephric fluid. Preserved parenchyma. Left Kidney: Renal measurements: 12.6 x 6.2 x 5.2 cm = volume: 214.5 mL. Moderate collecting system dilatation. No perinephric fluid. Preserved parenchyma. Bladder: Distended bladder with significant wall thickening measuring up to 11 mm. Prevoid volume of 294 cc. Other: Evaluation somewhat limited by patient has been difficulty with procedure as per the sonographer. IMPRESSION: Distended urinary bladder with a significant wall thickening. There is also moderate bilateral renal collecting system dilatation. Would recommend repeat study after decompression of the bladder to see if this persists or additional cross-sectional imaging workup with a CT when clinically appropriate Electronically Signed   By: Karen Kays M.D.   On: 02/20/2023 11:04   DG Chest 2 View Result Date: 02/19/2023 CLINICAL DATA:  Shortness of breath EXAM: CHEST - 2 VIEW COMPARISON:  08/02/2022 FINDINGS: Stable positioning of right chest port. The heart size and  mediastinal contours are within normal limits. Hyperinflated lungs. No focal airspace consolidation, pleural effusion, or pneumothorax. The visualized skeletal structures are unremarkable. IMPRESSION: No active cardiopulmonary disease. Electronically Signed   By: Duanne Guess D.O.   On: 02/19/2023 11:03  [2 weeks]  Assessment/Plan:   78 y.o. year old male with history of COPD on 4 L at baseline, gout, atrial fibrillation, diabetes, SVT, bladder mass, rectal adenocarcinoma currently undergoing chemo and radiation, who presented to the ER on 1/13 with concerns for UTI and also rectal bleeding, found to have AKI, hyperkalemia, and acute anemia. Also with altered mental status.   Acute anemia: with both rectal bleeding and gross hematuria. Hemoglobin 7.6 on admission down from 10.5 recently.  Nadir of 6.1 this admission. Son reported grape/red jelly stools for 1-1/2 weeks, Hemoccult positive on DRE in the ED.  No reports of melena.  No reports of overt GI bleeding since presenting to the ED.   -CT abdomen pelvis without contrast with diverticulosis sigmoid colon, question mild wall thickening of the rectum -Received 1 unit of packed red blood cells with hemoglobin up from 6.1 to 9.1--> 9.6--> 9.4. Hgb stable for last two days. -Suspect bleeding due to rectal tumor necrosis, endoscopic options would be limited. -Continue MiraLAX 17 g daily to maintain soft stools -Some decline in hemoglobin likely contributed to by gross hematuria   Abdominal pain: etiology likely multifactorial -CT abdomen pelvis  without contrast with thick-walled bladder suggesting cystitis, resulting in degree of ureteral obstruction with dilated renal collecting systems and ureters on both sides. -Left bladder mass/distal left ureteral mass s/p TURBT 11/2022 without malignancy,  and rectal adenocarcinoma. -Urine culture no growth -abdominal pain improved, denies today.  GI to sign off, please reach out with any  questions/concerns.    LOS: 3 days   Leanna Battles. Dixon Boos [facility] [phone] 1/16/20258:29 AM

## 2023-02-23 LAB — BASIC METABOLIC PANEL
Anion gap: 9 (ref 5–15)
BUN: 28 mg/dL — ABNORMAL HIGH (ref 8–23)
CO2: 24 mmol/L (ref 22–32)
Calcium: 8.3 mg/dL — ABNORMAL LOW (ref 8.9–10.3)
Chloride: 102 mmol/L (ref 98–111)
Creatinine, Ser: 2.22 mg/dL — ABNORMAL HIGH (ref 0.61–1.24)
GFR, Estimated: 30 mL/min — ABNORMAL LOW (ref >60)
Glucose, Bld: 120 mg/dL — ABNORMAL HIGH (ref 70–99)
Potassium: 5 mmol/L (ref 3.5–5.1)
Sodium: 135 mmol/L (ref 135–145)

## 2023-02-23 LAB — CBC
HCT: 31.2 % — ABNORMAL LOW (ref 39.0–52.0)
Hemoglobin: 9.5 g/dL — ABNORMAL LOW (ref 13.0–17.0)
MCH: 29.4 pg (ref 26.0–34.0)
MCHC: 30.4 g/dL (ref 30.0–36.0)
MCV: 96.6 fL (ref 80.0–100.0)
Platelets: 385 10*3/uL (ref 150–400)
RBC: 3.23 MIL/uL — ABNORMAL LOW (ref 4.22–5.81)
RDW: 14.9 % (ref 11.5–15.5)
WBC: 8.4 10*3/uL (ref 4.0–10.5)
nRBC: 0 % (ref 0.0–0.2)

## 2023-02-23 MED ORDER — TAMSULOSIN HCL 0.4 MG PO CAPS: 0.4000 mg | ORAL_CAPSULE | Freq: Every day | ORAL | 2 refills | Status: DC

## 2023-02-23 MED ORDER — POLYETHYLENE GLYCOL 3350 17 G PO PACK: 34.0000 g | PACK | Freq: Every day | ORAL | 2 refills | Status: DC

## 2023-02-23 MED ORDER — FINASTERIDE 5 MG PO TABS: 5.0000 mg | ORAL_TABLET | Freq: Every day | ORAL | 2 refills | Status: DC

## 2023-02-23 MED ORDER — HEPARIN SOD (PORK) LOCK FLUSH 100 UNIT/ML IV SOLN
INTRAVENOUS | Status: AC
  Filled 2023-02-23: qty 5

## 2023-02-23 NOTE — Discharge Instructions (Addendum)
PLEASE TAKE MIRALAX AT LEAST 2 CAPFULS DAILY FOR SOFT BOWEL MOVEMENTS   PLEASE FOLLOW UP WITH [facility] IN [place] FOR FOLEY CATH REMOVAL AND VOIDING TRIAL IN 1-2 WEEKS   IMPORTANT INFORMATION: PAY CLOSE ATTENTION   PHYSICIAN DISCHARGE INSTRUCTIONS  Follow with Primary care provider  [name]  and other consultants as instructed by your Hospitalist Physician  SEEK MEDICAL CARE OR RETURN TO EMERGENCY ROOM IF SYMPTOMS COME BACK, WORSEN OR NEW PROBLEM DEVELOPS   Please note: You were cared for by a hospitalist during your hospital stay. Every effort will be made to forward records to your primary care provider.  You can request that your primary care provider send for your hospital records if they have not received them.  Once you are discharged, your primary care physician will handle any further medical issues. Please note that NO REFILLS for any discharge medications will be authorized once you are discharged, as it is imperative that you return to your primary care physician (or establish a relationship with a primary care physician if you do not have one) for your post hospital discharge needs so that they can reassess your need for medications and monitor your lab values.  Please get a complete blood count and chemistry panel checked by your Primary MD at your next visit, and again as instructed by your Primary MD.  Get Medicines reviewed and adjusted: Please take all your medications with you for your next visit with your Primary MD  Laboratory/radiological data: Please request your Primary MD to go over all hospital tests and procedure/radiological results at the follow up, please ask your primary care provider to get all Hospital records sent to his/her office.  In some cases, they will be blood work, cultures and biopsy results pending at the time of your discharge. Please request that your primary care provider follow up on these results.  If you  are diabetic, please bring your blood sugar readings with you to your follow up appointment with primary care.    Please call and make your follow up appointments as soon as possible.    Also Note the following: If you experience worsening of your admission symptoms, develop shortness of breath, life threatening emergency, suicidal or homicidal thoughts you must seek medical attention immediately by calling 911 or calling your MD immediately  if symptoms less severe.  You must read complete instructions/literature along with all the possible adverse reactions/side effects for all the Medicines you take and that have been prescribed to you. Take any new Medicines after you have completely understood and accpet all the possible adverse reactions/side effects.   Do not drive when taking Pain medications or sleeping medications (Benzodiazepines)  Do not take more than prescribed Pain, Sleep and Anxiety Medications. It is not advisable to combine anxiety,sleep and pain medications without talking with your primary care practitioner  Special Instructions: If you have smoked or chewed Tobacco  in the last 2 yrs please stop smoking, stop any regular Alcohol  and or any Recreational drug use.  Wear Seat belts while driving.  Do not drive if taking any narcotic, mind altering or controlled substances or recreational drugs or alcohol.

## 2023-02-23 NOTE — Care Management Important Message (Signed)
Important Message  Patient Details  Name: James Miles MRN: [id number] Date of Birth: 1945/10/24   Important Message Given:  Yes - Medicare IM     Corey Harold 02/23/2023, 11:21 AM

## 2023-02-23 NOTE — Hospital Course (Signed)
78 y.o. male past medical history significant for COPD on 4 L of oxygen, gout, rectal adenocarcinoma on chemotherapy and radiation, paroxysmal A-fib not on anticoagulation, diabetes mellitus type 2, history of bladder cancer, came into the hospital for acute GI bleed

## 2023-02-23 NOTE — TOC Transition Note (Signed)
Transition of Care [facility]) - Discharge Note   Patient Details  Name: James Miles MRN: [id number] Date of Birth: January 20, 1946  Transition of Care [facility]) CM/SW Contact:  Leitha Bleak, RN Phone Number: 02/23/2023, 11:03 AM   Clinical Narrative:   Patient discharging home. PT is recommending HHPT and ordered a walker. CM spoke with his son, [name] and gave choices.  Son will come shortly to discuss discharge plan. He is agreeable to HHPT and wants the walker delivered to the room.  Zach with [employer] is processing the order and will deliver. RN updated. Kandee Keen with [facility] accepted the HHPT referral.  Added to AVS.     Final next level of care: Home w Home Health Services Barriers to Discharge: Barriers Resolved   Patient Goals and CMS Choice Patient states their goals for this hospitalization and ongoing recovery are:: return home CMS Medicare.gov Compare Post Acute Care list provided to:: Patient Choice offered to / list presented to : Patient      Discharge Placement                  Name of family member notified: son Patient and family notified of of transfer: 02/23/23  Discharge Plan and Services Additional resources added to the After Visit Summary for   In-house Referral: Clinical Social Work Discharge Planning Services: CM Consult            DME Arranged: Dan Humphreys DME Agency: [employer] Date DME Agency Contacted: 02/23/23 Time DME Agency Contacted: 1101 Representative spoke with at DME Agency: Ian Malkin HH Arranged: PT HH Agency: [facility] Date [employer] Agency Contacted: 02/23/23 Time HH Agency Contacted: 1102 Representative spoke with at [facility] Agency: Kandee Keen  Social Drivers of Health (SDOH) Interventions SDOH Screenings   Food Insecurity: No Food Insecurity (02/19/2023)  Housing: Unknown (02/19/2023)  Transportation Needs: No Transportation Needs (02/19/2023)  Utilities: Not At Risk (02/19/2023)  Depression (PHQ2-9): Low Risk  (05/08/2022)  Social Connections:  Unknown (02/19/2023)  Tobacco Use: Medium Risk (02/19/2023)     Readmission Risk Interventions    02/23/2023   11:01 AM 02/21/2023    2:10 PM  Readmission Risk Prevention Plan  Transportation Screening  Complete  PCP or Specialist Appt within 3-5 Days Complete   HRI or Home Care Consult  Complete  Social Work Consult for Recovery Care Planning/Counseling  Complete  Palliative Care Screening  Not Applicable  Medication Review [employer])  Complete

## 2023-02-23 NOTE — Telephone Encounter (Signed)
Thanks

## 2023-02-23 NOTE — Discharge Summary (Addendum)
Physician Discharge Summary  James Miles ZOX:[id number] DOB: 1945/09/09 DOA: 02/19/2023  PCP: [facility] Ort [name]  Admit date: 02/19/2023 Discharge date: 02/23/2023  Admitted From:  HOME  Disposition: HOME WITH HH   Recommendations for Outpatient Follow-up:  Follow up with [facility] [place] for foley removal / void trial in 1-2 weeks Follow up with Dr. Ellin Saba in 1-2 weeks  Please obtain BMP/CBC in 1-2 weeks Recommend referral for outpatient palliative medicine consultation  Discharge Condition: STABLE   CODE STATUS: FULL DIET: resume prior home diet    Brief Hospitalization Summary: Please see all hospital notes, images, labs for full details of the hospitalization. Admission provider HPI:  78 y.o. male past medical history significant for COPD on 4 L of oxygen, gout, rectal adenocarcinoma on chemotherapy and radiation, paroxysmal A-fib not on anticoagulation, diabetes mellitus type 2, history of bladder cancer, came into the hospital for acute GI bleed   Hospital Course by problem list   Acute blood loss anemia/hematuria/GI bleed: Likely due to presumably stage II rectal carcinoma has experienced intermittent bleeding for the past 6 months, he began radiation 12/28/2022. He also had hematuria likely due to traumatic Foley placement which is now resolved On 01/15/2023 hemoglobin was 12, on admission 6.6 he has been transfused 2 unit of packed red blood cells, his hemoglobin has remained stable at 9.5. GI was consulted commended conservative management.  Follow up with [facility] in 2-3 weeks.   GI recommended miralax at least 2 capfuls daily to keep stool soft and to try to avoid further rectal bleeding.    Acute metabolic encephalopathy:  RESOLVED  Multifactorial in the setting of anemia, acute kidney injury and possible UTI. At high risk of delirium and aspiration.   Acute kidney injury: IMPROVING Likely from obstruction given urinary retention Creatinine  trending down daily after foley cath placement With a baseline creatinine of less than 1 about 3 weeks prior to admission, on admission 3.6. Creatinine down to 2.2 today Continue foley cath at discharge Recheck BMP in 1 week recommended    Hyperkalemia: RESOLVED Start on Lokelma his potassium is trending down. Potassium is improved.  Continue to monitor intermittently. Recheck BMP in 1 week recommended   Hypovolemic hyponatremia: Resolved with IV fluids.   UTI: TREATED  Diagnosis and outpatient was treated with Cipro. Denies any symptoms here. Urine cultures and blood cultures were sent they have been negative till date. Antibiotics were stopped on 02/20/2023.   Acute urinary retention: Foley cath placed with good urine output  -currently on Flomax and finasteride -DC home with foley  -follow up with [facility] office for foley removal and void trial.  Pt says he has appt 1/22 with Dr Ronne Binning   History of paroxysmal atrial fibrillation: Status post ablation rate control not on anticoagulation.   COPD/chronic respiratory failure with hypoxia: Continue 4 L of oxygen.   Discharge Diagnoses:  Principal Problem:   GI bleed Active Problems:   Hyperkalemia   Acute blood loss anemia   Pressure injury of skin   Hematochezia   Discharge Instructions: Discharge Instructions     Ambulatory referral to Hematology / Oncology   Complete by: As directed    Ambulatory referral to Urology   Complete by: As directed    Hospital follow up for foley removal and voiding trial      Allergies as of 02/23/2023   No Known Allergies      Medication List     TAKE these medications  albuterol 108 (90 Base) MCG/ACT inhaler Commonly known as: VENTOLIN HFA Inhale 2 puffs into the lungs every 6 (six) hours as needed for wheezing or shortness of breath.   atorvastatin 20 MG tablet Commonly known as: LIPITOR Take 20 mg by mouth daily.   capecitabine 500 MG  tablet Commonly known as: Xeloda Take 3 tablets (1,500 mg total) by mouth 2 (two) times daily after a meal. Take Monday-Friday. Take only on days of radiation.   empagliflozin 25 MG Tabs tablet Commonly known as: JARDIANCE Take 25 mg by mouth daily.   febuxostat 40 MG tablet Commonly known as: ULORIC Take 1 tablet (40 mg total) by mouth daily.   fenofibrate 54 MG tablet Take 54 mg by mouth daily.   finasteride 5 MG tablet Commonly known as: PROSCAR Take 1 tablet (5 mg total) by mouth daily. Start taking on: February 24, 2023   loperamide 2 MG capsule Commonly known as: IMODIUM Take 2 mg by mouth 3 (three) times daily as needed for diarrhea or loose stools.   MELATONIN PO Take 1 tablet by mouth at bedtime as needed (sleep).   Misc. Devices Misc Please provide with shower chair   polyethylene glycol 17 g packet Commonly known as: MIRALAX / GLYCOLAX Take 34 g by mouth daily. Start taking on: February 24, 2023   tamsulosin 0.4 MG Caps capsule Commonly known as: FLOMAX Take 1 capsule (0.4 mg total) by mouth daily after supper.   traMADol 50 MG tablet Commonly known as: ULTRAM Take 50 mg by mouth every 6 (six) hours as needed for moderate pain (pain score 4-6).               Durable Medical Equipment  (From admission, onward)           Start     Ordered   02/22/23 1528  For home use only DME Walker rolling  Once       Comments: Pt unsteady and utilizing DME for powering to stand with min assist provided by therapist. Weakness and unsteadiness limit pt's ambulation without AD.  Question Answer Comment  Walker: With 5 Inch Wheels   Patient needs a walker to treat with the following condition Impaired functional mobility, balance, gait, and endurance      02/22/23 1528            Follow-up Information     [facility] at [facility]. Schedule an appointment as soon as possible for a visit in 2 week(s).   Specialty:  Gastroenterology Why: Hospital Follow Up Contact information: [address] [place] [address] [phone]        [facility] at [facility]. Schedule an appointment as soon as possible for a visit in 2 week(s).   Specialty: Oncology Why: Hospital Follow Up Contact information: [address] [place] [address] [phone]        [facility] [place]. Schedule an appointment as soon as possible for a visit in 1 week(s).   Why: Hospital Follow Up FOR FOLEY REMOVAL AND VOIDING TRIAL Contact information: [address] [place] [address] [phone]               No Known Allergies Allergies as of 02/23/2023   No Known Allergies      Medication List     TAKE these medications    albuterol 108 (90 Base) MCG/ACT inhaler Commonly known as: VENTOLIN HFA Inhale 2  puffs into the lungs every 6 (six) hours as needed for wheezing or shortness of breath.   atorvastatin 20 MG tablet Commonly known as: LIPITOR Take 20 mg by mouth daily.   capecitabine 500 MG tablet Commonly known as: Xeloda Take 3 tablets (1,500 mg total) by mouth 2 (two) times daily after a meal. Take Monday-Friday. Take only on days of radiation.   empagliflozin 25 MG Tabs tablet Commonly known as: JARDIANCE Take 25 mg by mouth daily.   febuxostat 40 MG tablet Commonly known as: ULORIC Take 1 tablet (40 mg total) by mouth daily.   fenofibrate 54 MG tablet Take 54 mg by mouth daily.   finasteride 5 MG tablet Commonly known as: PROSCAR Take 1 tablet (5 mg total) by mouth daily. Start taking on: February 24, 2023   loperamide 2 MG capsule Commonly known as: IMODIUM Take 2 mg by mouth 3 (three) times daily as needed for diarrhea or loose stools.   MELATONIN PO Take 1 tablet by mouth at bedtime as needed (sleep).   Misc. Devices Misc Please provide with shower chair   polyethylene  glycol 17 g packet Commonly known as: MIRALAX / GLYCOLAX Take 34 g by mouth daily. Start taking on: February 24, 2023   tamsulosin 0.4 MG Caps capsule Commonly known as: FLOMAX Take 1 capsule (0.4 mg total) by mouth daily after supper.   traMADol 50 MG tablet Commonly known as: ULTRAM Take 50 mg by mouth every 6 (six) hours as needed for moderate pain (pain score 4-6).               Durable Medical Equipment  (From admission, onward)           Start     Ordered   02/22/23 1528  For home use only DME Walker rolling  Once       Comments: Pt unsteady and utilizing DME for powering to stand with min assist provided by therapist. Weakness and unsteadiness limit pt's ambulation without AD.  Question Answer Comment  Walker: With 5 Inch Wheels   Patient needs a walker to treat with the following condition Impaired functional mobility, balance, gait, and endurance      02/22/23 1528            Procedures/Studies: CT ABDOMEN PELVIS WO CONTRAST Result Date: 02/20/2023 CLINICAL DATA:  Acute abdominal pain. Nonlocalized generalized abdominal tenderness. Rectal bleeding. Hematuria. EXAM: CT ABDOMEN AND PELVIS WITHOUT CONTRAST TECHNIQUE: Multidetector CT imaging of the abdomen and pelvis was performed following the standard protocol without IV contrast. RADIATION DOSE REDUCTION: This exam was performed according to the departmental dose-optimization program which includes automated exposure control, adjustment of the mA and/or kV according to patient size and/or use of iterative reconstruction technique. COMPARISON:  05/09/2022 FINDINGS: Lower chest: Mild patchy density at the dependent right lower lobe could be atelectasis or mild right base pneumonia. Hepatobiliary: Liver parenchyma appears normal without contrast. No calcified gallstones. Pancreas: Normal Spleen: Normal Adrenals/Urinary Tract: Thick-walled bladder. Some air in the bladder, presumably secondary to recent  catheterization. Probable cystitis. This is resulting in a degree of ureteral obstruction with dilated renal collecting systems and ureters on both sides. Nonobstructing 2 mm stone in the right kidney. No evidence of passing stone. No renal parenchymal mass. Stomach/Bowel: Stomach and small intestine are normal. There is diverticulosis of the sigmoid colon but no evidence of diverticulitis. One could question mild wall thickening of the rectum that could go along with mild proctitis. This is not  definite. Vascular/Lymphatic: Aortic atherosclerosis. No aneurysm. IVC is normal. No adenopathy. Reproductive: Normal Other: No free fluid or air. Musculoskeletal: Ordinary lower lumbar degenerative changes and osteoarthritis of the hips. IMPRESSION: 1. Thick-walled bladder. Some air in the bladder, presumably secondary to recent catheterization. Probable cystitis. This is resulting in a degree of ureteral obstruction with dilated renal collecting systems and ureters on both sides. Nonobstructing 2 mm stone in the right kidney. No evidence of passing stone. 2. Diverticulosis of the sigmoid colon without evidence of diverticulitis. One could question mild wall thickening of the rectum that could go along with mild proctitis. This is not definite. 3. Mild patchy density at the dependent right lower lobe could be atelectasis or mild right base pneumonia. Aortic Atherosclerosis (ICD10-I70.0). Electronically Signed   By: Paulina Fusi M.D.   On: 02/20/2023 16:20   US RENAL Result Date: 02/20/2023 CLINICAL DATA:  Acute kidney injury. EXAM: RENAL / URINARY TRACT ULTRASOUND COMPLETE COMPARISON:  CT scan 05/09/2022. FINDINGS: Right Kidney: Renal measurements: 12.4 x 6.0 x 5.9 cm = volume: 229 mL. Moderate right renal collecting system dilatation. No perinephric fluid. Preserved parenchyma. Left Kidney: Renal measurements: 12.6 x 6.2 x 5.2 cm = volume: 214.5 mL. Moderate collecting system dilatation. No perinephric fluid. Preserved  parenchyma. Bladder: Distended bladder with significant wall thickening measuring up to 11 mm. Prevoid volume of 294 cc. Other: Evaluation somewhat limited by patient has been difficulty with procedure as per the sonographer. IMPRESSION: Distended urinary bladder with a significant wall thickening. There is also moderate bilateral renal collecting system dilatation. Would recommend repeat study after decompression of the bladder to see if this persists or additional cross-sectional imaging workup with a CT when clinically appropriate Electronically Signed   By: Karen Kays M.D.   On: 02/20/2023 11:04   DG Chest 2 View Result Date: 02/19/2023 CLINICAL DATA:  Shortness of breath EXAM: CHEST - 2 VIEW COMPARISON:  08/02/2022 FINDINGS: Stable positioning of right chest port. The heart size and mediastinal contours are within normal limits. Hyperinflated lungs. No focal airspace consolidation, pleural effusion, or pneumothorax. The visualized skeletal structures are unremarkable. IMPRESSION: No active cardiopulmonary disease. Electronically Signed   By: Duanne Guess D.O.   On: 02/19/2023 11:03     Subjective: No further gross hematuria or rectal bleeding seen.  Pt says he will follow up with urology as he already has appt for 1/22.   Discharge Exam: Vitals:   02/23/23 0900 02/23/23 1000  BP: 123/78 130/70  Pulse: 88 75  Resp: 13 14  Temp:    SpO2: 99% 98%   Vitals:   02/23/23 0800 02/23/23 0803 02/23/23 0900 02/23/23 1000  BP: (!) 145/83  123/78 130/70  Pulse:   88 75  Resp: (!) 23  13 14   Temp:  98.8 F (37.1 C)    TempSrc:  Oral    SpO2: 97%  99% 98%  Weight:      Height:        General: Pt is alert, appears chronically ill,  awake, not in acute distress Cardiovascular: normal S1/S2 +, no rubs, no gallops Respiratory: CTA bilaterally, no wheezing, no rhonchi Abdominal: Soft, NT, ND, bowel sounds + Extremities: no edema, no cyanosis GU: foley in place with amber urine seen.     The results of significant diagnostics from this hospitalization (including imaging, microbiology, ancillary and laboratory) are listed below for reference.     Microbiology: Recent Results (from the past 240 hours)  Urine Culture  Status: None   Collection Time: 02/19/23  5:01 PM   Specimen: Urine, Clean Catch  Result Value Ref Range Status   Specimen Description   Final    URINE, CLEAN CATCH Performed at [facility], [address] [place], [place] [address]    Special Requests   Final    NONE Performed at [facility], [address] [place], [place] [address]    Culture   Final    NO GROWTH Performed at [facility], [address], [place], [place] [address]    Report Status 02/20/2023 FINAL  Final  Blood culture (routine x 2)     Status: None (Preliminary result)   Collection Time: 02/19/23  5:16 PM   Specimen: BLOOD  Result Value Ref Range Status   Specimen Description BLOOD LEFT ANTECUBITAL  Final   Special Requests   Final    BOTTLES DRAWN AEROBIC AND ANAEROBIC Blood Culture adequate volume   Culture   Final    NO GROWTH 4 DAYS Performed at [facility], [address], [place], [place] [address]    Report Status PENDING  Incomplete  Blood culture (routine x 2)     Status: None (Preliminary result)   Collection Time: 02/19/23  5:20 PM   Specimen: BLOOD  Result Value Ref Range Status   Specimen Description BLOOD LEFT ANTECUBITAL  Final   Special Requests   Final    BOTTLES DRAWN AEROBIC AND ANAEROBIC Blood Culture adequate volume   Culture   Final    NO GROWTH 4 DAYS Performed at [facility], [address], [place], [place] [address]    Report Status PENDING  Incomplete  MRSA Next Gen by PCR, Nasal     Status: None   Collection Time: 02/19/23  9:49 PM   Specimen: Nasal Mucosa; Nasal Swab  Result Value Ref Range Status   MRSA by PCR Next Gen NOT DETECTED NOT DETECTED Final    Comment: (NOTE) The GeneXpert MRSA Assay (FDA approved for NASAL  specimens only), is one component of a comprehensive MRSA colonization surveillance program. It is not intended to diagnose MRSA infection nor to guide or monitor treatment for MRSA infections. Test performance is not FDA approved in patients less than 2 years old. Performed at [facility], [address] [place], [place] [address]      Labs: BNP (last 3 results) Recent Labs    08/02/22 0942  BNP 250.0*   Basic Metabolic Panel: Recent Labs  Lab 02/20/23 0330 02/20/23 0845 02/20/23 1535 02/21/23 0510 02/22/23 0500 02/23/23 0629  NA 132*  --  136 136 132* 135  K 6.1* 5.9* 6.1* 5.2* 4.9 5.0  CL 100  --  102 99 100 102  CO2 25  --  24 27 27 24   GLUCOSE 86  --  91 91 101* 120*  BUN 60*  --  55* 50* 40* 28*  CREATININE 3.18*  --  3.47* 3.13* 2.81* 2.22*  CALCIUM 7.8*  --  8.0* 8.5* 8.0* 8.3*   Liver Function Tests: Recent Labs  Lab 02/20/23 0845  AST 14*  ALT 15  ALKPHOS 54  BILITOT 0.9  PROT 5.7*  ALBUMIN 2.3*   No results for input(s): "LIPASE", "AMYLASE" in the last 168 hours. Recent Labs  Lab 02/20/23 1535  AMMONIA 19   CBC: Recent Labs  Lab 02/19/23 1036 02/19/23 1824 02/20/23 0330 02/20/23 0710 02/20/23 1535 02/21/23 0510 02/22/23 0500 02/23/23 0629  WBC 14.6* 10.9* 10.1  --   --  8.3 7.3 8.4  NEUTROABS 11.8*  --   --   --   --   --   --   --   HGB 7.6* 7.0* 6.6* 6.1* 9.1* 9.6* 9.4* 9.5*  HCT 25.7* 23.6* 20.3* 19.6* 27.9* 30.2* 29.5* 31.2*  MCV 100.4* 101.3* 101.0*  --   --  95.3 96.1 96.6  PLT 463* 420* 450*  --   --  412* 378 385   Cardiac Enzymes: No results for input(s): "CKTOTAL", "CKMB", "CKMBINDEX", "TROPONINI" in the last 168 hours. BNP: Invalid input(s): "POCBNP" CBG: Recent Labs  Lab 02/20/23 0148 02/20/23 0738  GLUCAP 94 94   D-Dimer No results for input(s): "DDIMER" in the last 72 hours. Hgb A1c No results for input(s): "HGBA1C" in the last 72 hours. Lipid Profile No results for input(s): "CHOL", "HDL", "LDLCALC",  "TRIG", "CHOLHDL", "LDLDIRECT" in the last 72 hours. Thyroid function studies Recent Labs    02/20/23 1535  TSH 1.117   Anemia work up Recent Labs    02/20/23 1535  VITAMINB12 830   Urinalysis    Component Value Date/Time   COLORURINE YELLOW 02/19/2023 1701   APPEARANCEUR CLOUDY (A) 02/19/2023 1701   APPEARANCEUR Cloudy (A) 10/18/2022 1513   LABSPEC 1.009 02/19/2023 1701   PHURINE 7.0 02/19/2023 1701   GLUCOSEU 150 (A) 02/19/2023 1701   HGBUR LARGE (A) 02/19/2023 1701   BILIRUBINUR NEGATIVE 02/19/2023 1701   BILIRUBINUR Negative 10/18/2022 1513   KETONESUR NEGATIVE 02/19/2023 1701   PROTEINUR 100 (A) 02/19/2023 1701   NITRITE NEGATIVE 02/19/2023 1701   LEUKOCYTESUR LARGE (A) 02/19/2023 1701   Sepsis Labs Recent Labs  Lab 02/20/23 0330 02/21/23 0510 02/22/23 0500 02/23/23 0629  WBC 10.1 8.3 7.3 8.4   Microbiology Recent Results (from the past 240 hours)  Urine Culture     Status: None   Collection Time: 02/19/23  5:01 PM   Specimen: Urine, Clean Catch  Result Value Ref Range Status   Specimen Description   Final    URINE, CLEAN CATCH Performed at [facility], [address] [place], [place] [address]    Special Requests   Final    NONE Performed at [facility], [address] [place], [place] [address]    Culture   Final    NO GROWTH Performed at [facility], [address], [place], [place] [address]    Report Status 02/20/2023 FINAL  Final  Blood culture (routine x 2)     Status: None (Preliminary result)   Collection Time: 02/19/23  5:16 PM   Specimen: BLOOD  Result Value Ref Range Status   Specimen Description BLOOD LEFT ANTECUBITAL  Final   Special Requests   Final    BOTTLES DRAWN AEROBIC AND ANAEROBIC Blood Culture adequate volume   Culture   Final    NO GROWTH 4 DAYS Performed at [facility], [address] [place], [place] [address]    Report Status PENDING  Incomplete  Blood culture (routine x 2)     Status: None (Preliminary  result)   Collection Time: 02/19/23  5:20 PM   Specimen: BLOOD  Result Value Ref Range Status   Specimen Description BLOOD LEFT ANTECUBITAL  Final   Special Requests   Final    BOTTLES DRAWN AEROBIC AND ANAEROBIC Blood Culture adequate volume   Culture   Final    NO GROWTH 4 DAYS Performed at [facility], [address], [place], [place] [address]    Report Status PENDING  Incomplete  MRSA Next Gen by PCR, Nasal     Status: None   Collection Time: 02/19/23  9:49 PM   Specimen: Nasal Mucosa; Nasal Swab  Result Value Ref Range Status   MRSA by PCR Next Gen NOT DETECTED NOT DETECTED Final    Comment: (NOTE) The GeneXpert MRSA Assay (FDA approved for NASAL specimens only), is one component of a comprehensive MRSA colonization surveillance program. It is not intended to diagnose MRSA infection nor to guide or monitor treatment for MRSA infections. Test performance is not FDA approved in patients less than 17 years old. Performed at [facility], [address], [place], [place] [address]    Time coordinating discharge: 43 mins   SIGNED:  Standley Dakins, MD  [facility] 02/23/2023, 10:48 AM How to contact the [facility] Attending or Consulting provider 7A - 7P or covering provider during after hours 7P -7A, for this patient?  Check the care team in [facility] and look for a) attending/consulting [facility] provider listed and b) the [facility] team listed Log into [email or web address] and use [facility]'s universal password to access. If you do not have the password, please contact the hospital operator. Locate the [facility] provider you are looking for under [facility] and page to a number that you can be directly reached. If you still have difficulty reaching the provider, please page the [facility] (Director on Call) for the Hospitalists listed on amion for assistance.

## 2023-02-24 LAB — CULTURE, BLOOD (ROUTINE X 2)
Culture: NO GROWTH
Culture: NO GROWTH
Special Requests: ADEQUATE
Special Requests: ADEQUATE

## 2023-02-27 ENCOUNTER — Encounter: Payer: Self-pay | Admitting: Hematology

## 2023-02-28 ENCOUNTER — Ambulatory Visit: Payer: PPO | Admitting: Urology

## 2023-02-28 ENCOUNTER — Other Ambulatory Visit: Payer: PPO | Admitting: Urology

## 2023-02-28 VITALS — BP 126/72 | HR 112

## 2023-02-28 DIAGNOSIS — N401 Enlarged prostate with lower urinary tract symptoms: Secondary | ICD-10-CM | POA: Diagnosis not present

## 2023-02-28 DIAGNOSIS — N138 Other obstructive and reflux uropathy: Secondary | ICD-10-CM

## 2023-02-28 DIAGNOSIS — R339 Retention of urine, unspecified: Secondary | ICD-10-CM | POA: Diagnosis not present

## 2023-02-28 MED ORDER — TAMSULOSIN HCL 0.4 MG PO CAPS: 0.4000 mg | ORAL_CAPSULE | Freq: Two times a day (BID) | ORAL | 11 refills | Status: DC

## 2023-02-28 MED ORDER — FINASTERIDE 5 MG PO TABS: 5.0000 mg | ORAL_TABLET | Freq: Every day | ORAL | 3 refills | Status: DC

## 2023-02-28 MED ORDER — CIPROFLOXACIN HCL 500 MG PO TABS
500.0000 mg | ORAL_TABLET | Freq: Once | ORAL | Status: AC
  Administered 2023-02-28: 500 mg via ORAL

## 2023-02-28 NOTE — Progress Notes (Signed)
02/28/2023 11:30 AM   James Miles 18-Oct-1945 [id number]  Referring provider: Cleora Fleet, MD [address] [place],  [place] [address]  Urinary retention   HPI: James Miles is a 77yo here for evaluation of urinary retention. He presented to the ER 1/17 with UTI and clot retention requiring foley insertion and clot evacuation. Urine is now clear. He was started on flomax and finasteride.    PMH: Past Medical History:  Diagnosis Date   Back pain    COPD (chronic obstructive pulmonary disease) (HCC)    Diabetes mellitus without complication (HCC)    Dyspnea    Dysrhythmia    Hyperlipidemia    Oxygen dependent    4L when needed    Surgical History: Past Surgical History:  Procedure Laterality Date   A-FLUTTER ABLATION N/A 08/23/2022   Procedure: A-FLUTTER ABLATION;  Surgeon: Marinus Maw, MD;  Location: [facility];  Service: Cardiovascular;  Laterality: N/A;   BIOPSY  04/26/2022   Procedure: BIOPSY;  Surgeon: Corbin Ade, MD;  Location: [facility];  Service: Endoscopy;;   BLADDER INSTILLATION N/A 11/16/2022   Procedure: BLADDER INSTILLATION- Gemcitibine;  Surgeon: Malen Gauze, MD;  Location: [facility];  Service: Urology;  Laterality: N/A;   COLONOSCOPY WITH PROPOFOL N/A 04/26/2022   Procedure: COLONOSCOPY WITH PROPOFOL;  Surgeon: Corbin Ade, MD;  Location: [facility];  Service: Endoscopy;  Laterality: N/A;  10:00am; ASA 3   CYSTOSCOPY N/A 11/16/2022   Procedure: CYSTOSCOPY;  Surgeon: Malen Gauze, MD;  Location: [facility];  Service: Urology;  Laterality: N/A;   hemorhoidectomy     IR IMAGING GUIDED PORT INSERTION  05/12/2022   POLYPECTOMY  04/26/2022   Procedure: POLYPECTOMY;  Surgeon: Corbin Ade, MD;  Location: [facility];  Service: Endoscopy;;   TRANSURETHRAL RESECTION OF BLADDER TUMOR N/A 11/16/2022   Procedure: TRANSURETHRAL RESECTION OF BLADDER TUMOR (TURBT);  Surgeon: Malen Gauze, MD;  Location: [facility];   Service: Urology;  Laterality: N/A;    Home Medications:  Allergies as of 02/28/2023   No Known Allergies      Medication List        Accurate as of February 28, 2023 11:30 AM. If you have any questions, ask your nurse or doctor.          albuterol 108 (90 Base) MCG/ACT inhaler Commonly known as: VENTOLIN HFA Inhale 2 puffs into the lungs every 6 (six) hours as needed for wheezing or shortness of breath.   atorvastatin 20 MG tablet Commonly known as: LIPITOR Take 20 mg by mouth daily.   capecitabine 500 MG tablet Commonly known as: Xeloda Take 3 tablets (1,500 mg total) by mouth 2 (two) times daily after a meal. Take Monday-Friday. Take only on days of radiation.   empagliflozin 25 MG Tabs tablet Commonly known as: JARDIANCE Take 25 mg by mouth daily.   febuxostat 40 MG tablet Commonly known as: ULORIC Take 1 tablet (40 mg total) by mouth daily.   fenofibrate 54 MG tablet Take 54 mg by mouth daily.   finasteride 5 MG tablet Commonly known as: PROSCAR Take 1 tablet (5 mg total) by mouth daily.   loperamide 2 MG capsule Commonly known as: IMODIUM Take 2 mg by mouth 3 (three) times daily as needed for diarrhea or loose stools.   MELATONIN PO Take 1 tablet by mouth at bedtime as needed (sleep).   Misc. Devices Misc Please provide with shower  chair   polyethylene glycol 17 g packet Commonly known as: MIRALAX / GLYCOLAX Take 34 g by mouth daily.   tamsulosin 0.4 MG Caps capsule Commonly known as: FLOMAX Take 1 capsule (0.4 mg total) by mouth daily after supper.   traMADol 50 MG tablet Commonly known as: ULTRAM Take 50 mg by mouth every 6 (six) hours as needed for moderate pain (pain score 4-6).        Allergies: No Known Allergies  Family History: No family history on file.  Social History:  reports that he quit smoking about 6 years ago. His smoking use included cigarettes. He started smoking about 56 years ago. He has never used smokeless  tobacco. He reports that he does not drink alcohol and does not use drugs.  ROS: All other review of systems were reviewed and are negative except what is noted above in HPI  Physical Exam: BP 126/72   Pulse (!) 112   Constitutional:  Alert and oriented, No acute distress. HEENT: James Miles AT, moist mucus membranes.  Trachea midline, no masses. Cardiovascular: No clubbing, cyanosis, or edema. Respiratory: Normal respiratory effort, no increased work of breathing. GI: Abdomen is soft, nontender, nondistended, no abdominal masses GU: No CVA tenderness.  Lymph: No cervical or inguinal lymphadenopathy. Skin: No rashes, bruises or suspicious lesions. Neurologic: Grossly intact, no focal deficits, moving all 4 extremities. Psychiatric: Normal mood and affect.  Laboratory Data: Lab Results  Component Value Date   WBC 8.4 02/23/2023   HGB 9.5 (L) 02/23/2023   HCT 31.2 (L) 02/23/2023   MCV 96.6 02/23/2023   PLT 385 02/23/2023    Lab Results  Component Value Date   CREATININE 2.22 (H) 02/23/2023    Lab Results  Component Value Date   PSA 3.1 10/03/2018   PSA 1.7 10/03/2017    No results found for: "TESTOSTERONE"  Lab Results  Component Value Date   HGBA1C 7.8 (H) 07/08/2022    Urinalysis    Component Value Date/Time   COLORURINE YELLOW 02/19/2023 1701   APPEARANCEUR CLOUDY (A) 02/19/2023 1701   APPEARANCEUR Cloudy (A) 10/18/2022 1513   LABSPEC 1.009 02/19/2023 1701   PHURINE 7.0 02/19/2023 1701   GLUCOSEU 150 (A) 02/19/2023 1701   HGBUR LARGE (A) 02/19/2023 1701   BILIRUBINUR NEGATIVE 02/19/2023 1701   BILIRUBINUR Negative 10/18/2022 1513   KETONESUR NEGATIVE 02/19/2023 1701   PROTEINUR 100 (A) 02/19/2023 1701   NITRITE NEGATIVE 02/19/2023 1701   LEUKOCYTESUR LARGE (A) 02/19/2023 1701    Lab Results  Component Value Date   LABMICR See below: 10/18/2022   WBCUA 11-30 (A) 10/18/2022   LABEPIT 0-10 10/18/2022   BACTERIA RARE (A) 02/19/2023    Pertinent  Imaging:  No results found for this or any previous visit.  Results for orders placed during the hospital encounter of 10/07/18  US Venous Img Lower Bilateral  Narrative CLINICAL DATA:  Bilateral lower extremity swelling.  EXAM: BILATERAL LOWER EXTREMITY VENOUS DOPPLER ULTRASOUND  TECHNIQUE: Gray-scale sonography with graded compression, as well as color Doppler and duplex ultrasound were performed to evaluate the lower extremity deep venous systems from the level of the common femoral vein and including the common femoral, femoral, profunda femoral, popliteal and calf veins including the posterior tibial, peroneal and gastrocnemius veins when visible. The superficial great saphenous vein was also interrogated. Spectral Doppler was utilized to evaluate flow at rest and with distal augmentation maneuvers in the common femoral, femoral and popliteal veins.  COMPARISON:  None.  FINDINGS: RIGHT LOWER  EXTREMITY  Common Femoral Vein: No evidence of thrombus. Normal compressibility, respiratory phasicity and response to augmentation.  Saphenofemoral Junction: No evidence of thrombus. Normal compressibility and flow on color Doppler imaging.  Profunda Femoral Vein: No evidence of thrombus. Normal compressibility and flow on color Doppler imaging.  Femoral Vein: No evidence of thrombus. Normal compressibility, respiratory phasicity and response to augmentation.  Popliteal Vein: No evidence of thrombus. Normal compressibility, respiratory phasicity and response to augmentation.  Calf Veins: No evidence of thrombus. Normal compressibility and flow on color Doppler imaging.  Superficial Great Saphenous Vein: No evidence of thrombus. Normal compressibility.  Other Findings:  None.  LEFT LOWER EXTREMITY  Common Femoral Vein: No evidence of thrombus. Normal compressibility, respiratory phasicity and response to augmentation.  Saphenofemoral Junction: No evidence of  thrombus. Normal compressibility and flow on color Doppler imaging.  Profunda Femoral Vein: No evidence of thrombus. Normal compressibility and flow on color Doppler imaging.  Femoral Vein: No evidence of thrombus. Normal compressibility, respiratory phasicity and response to augmentation.  Popliteal Vein: No evidence of thrombus. Normal compressibility, respiratory phasicity and response to augmentation.  Calf Veins: No evidence of thrombus. Normal compressibility and flow on color Doppler imaging.  Other Findings: None.  IMPRESSION: No evidence of deep venous thrombosis in either lower extremity.   Electronically Signed By: Maisie Fus  Register On: 10/07/2018 14:00  No results found for this or any previous visit.  No results found for this or any previous visit.  Results for orders placed during the hospital encounter of 02/19/23  US RENAL  Narrative CLINICAL DATA:  Acute kidney injury.  EXAM: RENAL / URINARY TRACT ULTRASOUND COMPLETE  COMPARISON:  CT scan 05/09/2022.  FINDINGS: Right Kidney:  Renal measurements: 12.4 x 6.0 x 5.9 cm = volume: 229 mL. Moderate right renal collecting system dilatation. No perinephric fluid. Preserved parenchyma.  Left Kidney:  Renal measurements: 12.6 x 6.2 x 5.2 cm = volume: 214.5 mL. Moderate collecting system dilatation. No perinephric fluid. Preserved parenchyma.  Bladder:  Distended bladder with significant wall thickening measuring up to 11 mm. Prevoid volume of 294 cc.  Other:  Evaluation somewhat limited by patient has been difficulty with procedure as per the sonographer.  IMPRESSION: Distended urinary bladder with a significant wall thickening. There is also moderate bilateral renal collecting system dilatation. Would recommend repeat study after decompression of the bladder to see if this persists or additional cross-sectional imaging workup with a CT when clinically appropriate   Electronically  Signed By: Karen Kays M.D. On: 02/20/2023 11:04  No results found for this or any previous visit.  Results for orders placed during the hospital encounter of 05/09/22  CT HEMATURIA WORKUP  Narrative CLINICAL DATA:  Hematuria. Bladder mass on recent MRI. Rectal carcinoma. * Tracking Code: BO *  EXAM: CT ABDOMEN AND PELVIS WITHOUT AND WITH CONTRAST  TECHNIQUE: Multidetector CT imaging of the abdomen and pelvis was performed following the standard protocol before and following the bolus administration of intravenous contrast.  RADIATION DOSE REDUCTION: This exam was performed according to the departmental dose-optimization program which includes automated exposure control, adjustment of the mA and/or kV according to patient size and/or use of iterative reconstruction technique.  CONTRAST:  OMNIPAQUE IOHEXOL 300 MG/ML  SOLN  COMPARISON:  05/01/2022  FINDINGS: Lower Chest: No acute findings.  Hepatobiliary: No hepatic masses identified. Gallbladder is unremarkable. No evidence of biliary ductal dilatation.  Pancreas:  No mass or inflammatory changes.  Spleen: Within normal limits in size and  appearance.  Adrenals/Urinary Tract: No adrenal masses identified. Few tiny 1-2 mm calculi are noted in both kidneys. No evidence of ureteral calculi or hydronephrosis. No suspicious renal masses identified. No masses seen involving the collecting systems or ureters. Mild diffuse bladder wall thickening and trabeculation is seen which may be due to chronic bladder outlet obstruction or cystitis. A focal enhancing soft tissue nodule measuring approximately 1.2 cm is seen along the left lateral bladder wall, suspicious for bladder carcinoma.  Stomach/Bowel: Concentric rectal wall thickening and enhancement is seen, consistent with known primary rectal carcinoma. No evidence of obstruction, inflammatory process or abnormal fluid collections. Normal appendix visualized.  Diverticulosis is seen mainly involving the descending and sigmoid colon, however there is no evidence of diverticulitis.  Vascular/Lymphatic: No pathologically enlarged lymph nodes. No acute vascular findings. Aortic atherosclerotic calcification incidentally noted. 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  Reproductive:  No mass or other significant abnormality.  Other:  None.  Musculoskeletal:  No suspicious bone lesions identified.  IMPRESSION: 1.2 cm focal enhancing soft tissue nodule along the left lateral bladder wall, suspicious for bladder carcinoma.  Low rectal soft tissue mass, consistent with known primary rectal carcinoma.  No evidence of metastatic disease within the abdomen or pelvis.  Mild diffuse bladder wall thickening and trabeculation, which may be due to chronic bladder outlet obstruction or cystitis.  Tiny nonobstructing bilateral renal calculi.  Colonic diverticulosis, without radiographic evidence of diverticulitis.  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  Aortic Atherosclerosis (ICD10-I70.0).   Electronically Signed By: Danae Orleans M.D. On: 05/09/2022 10:58  No results found for this or any previous visit.   Assessment & Plan:    1. Urinary retention (Primary) Voiding trial passed today. Followup 1 month for POVR  2. Benign prostatic hyperplasia with urinary obstruction COntinue flomax and finasteride   No follow-ups on file.  Wilkie Aye, MD  [facility] [place]

## 2023-02-28 NOTE — Progress Notes (Signed)
post void residual = 125 ml  

## 2023-02-28 NOTE — Progress Notes (Signed)
Fill and Pull Catheter Removal  Patient is present today for a catheter removal.  Patient was cleaned and prepped in a sterile fashion 50ml of sterile water/ saline was instilled into the bladder when the patient had bladder spasms.  Verbal from MD to remove catheter and have patient come back this afternoon.  10ml of water was then drained from the balloon.  A 14FR foley cath was removed from the bladder no complications were noted .  Patient tolerated well.  Performed by: Guss Bunde, CMA  Follow up/ Additional notes: patient will return this afternoon for PVR

## 2023-03-01 ENCOUNTER — Other Ambulatory Visit: Payer: Self-pay

## 2023-03-02 ENCOUNTER — Telehealth: Payer: Self-pay | Admitting: Urology

## 2023-03-02 NOTE — Telephone Encounter (Signed)
Says [facility] did not receive RX

## 2023-03-02 NOTE — Telephone Encounter (Signed)
Spoke with Sharyl Nimrod at Pharmacy, she states last rx was filled o 01/17.  Pt can fill on 03/03/2023.  Reached out to patient with no answer, left a detailed message.

## 2023-03-06 ENCOUNTER — Encounter: Payer: Self-pay | Admitting: Urology

## 2023-03-06 ENCOUNTER — Other Ambulatory Visit: Payer: Self-pay

## 2023-03-06 ENCOUNTER — Ambulatory Visit (HOSPITAL_COMMUNITY)
Admission: RE | Admit: 2023-03-06 | Discharge: 2023-03-06 | Disposition: A | Discharge status: Home / Self Care | Payer: PPO | Source: Ambulatory Visit | Attending: Hematology | Admitting: Hematology

## 2023-03-06 ENCOUNTER — Inpatient Hospital Stay: Payer: PPO

## 2023-03-06 ENCOUNTER — Ambulatory Visit (HOSPITAL_COMMUNITY)
Admission: RE | Admit: 2023-03-06 | Discharge: 2023-03-06 | Discharge status: Home / Self Care | Payer: PPO | Source: Ambulatory Visit | Attending: Hematology | Admitting: Hematology

## 2023-03-06 DIAGNOSIS — D4122 Neoplasm of uncertain behavior of left ureter: Secondary | ICD-10-CM | POA: Diagnosis not present

## 2023-03-06 DIAGNOSIS — Z79899 Other long term (current) drug therapy: Secondary | ICD-10-CM | POA: Diagnosis not present

## 2023-03-06 DIAGNOSIS — R0602 Shortness of breath: Secondary | ICD-10-CM | POA: Diagnosis not present

## 2023-03-06 DIAGNOSIS — Z452 Encounter for adjustment and management of vascular access device: Secondary | ICD-10-CM | POA: Diagnosis not present

## 2023-03-06 DIAGNOSIS — I4892 Unspecified atrial flutter: Secondary | ICD-10-CM | POA: Diagnosis not present

## 2023-03-06 DIAGNOSIS — R059 Cough, unspecified: Secondary | ICD-10-CM | POA: Diagnosis not present

## 2023-03-06 DIAGNOSIS — N390 Urinary tract infection, site not specified: Secondary | ICD-10-CM | POA: Diagnosis not present

## 2023-03-06 DIAGNOSIS — R110 Nausea: Secondary | ICD-10-CM | POA: Diagnosis not present

## 2023-03-06 DIAGNOSIS — C20 Malignant neoplasm of rectum: Secondary | ICD-10-CM | POA: Insufficient documentation

## 2023-03-06 DIAGNOSIS — N329 Bladder disorder, unspecified: Secondary | ICD-10-CM | POA: Diagnosis not present

## 2023-03-06 DIAGNOSIS — Z7901 Long term (current) use of anticoagulants: Secondary | ICD-10-CM | POA: Diagnosis not present

## 2023-03-06 DIAGNOSIS — R7989 Other specified abnormal findings of blood chemistry: Secondary | ICD-10-CM | POA: Diagnosis present

## 2023-03-06 DIAGNOSIS — Z87891 Personal history of nicotine dependence: Secondary | ICD-10-CM | POA: Diagnosis not present

## 2023-03-06 LAB — CBC WITH DIFFERENTIAL/PLATELET
Abs Immature Granulocytes: 0.05 10*3/uL (ref 0.00–0.07)
Basophils Absolute: 0.1 10*3/uL (ref 0.0–0.1)
Basophils Relative: 1 %
Eosinophils Absolute: 0.4 10*3/uL (ref 0.0–0.5)
Eosinophils Relative: 6 %
HCT: 32.2 % — ABNORMAL LOW (ref 39.0–52.0)
Hemoglobin: 9.8 g/dL — ABNORMAL LOW (ref 13.0–17.0)
Immature Granulocytes: 1 %
Lymphocytes Relative: 24 %
Lymphs Abs: 1.6 10*3/uL (ref 0.7–4.0)
MCH: 29.8 pg (ref 26.0–34.0)
MCHC: 30.4 g/dL (ref 30.0–36.0)
MCV: 97.9 fL (ref 80.0–100.0)
Monocytes Absolute: 0.6 10*3/uL (ref 0.1–1.0)
Monocytes Relative: 10 %
Neutro Abs: 3.8 10*3/uL (ref 1.7–7.7)
Neutrophils Relative %: 58 %
Platelets: 312 10*3/uL (ref 150–400)
RBC: 3.29 MIL/uL — ABNORMAL LOW (ref 4.22–5.81)
RDW: 14.4 % (ref 11.5–15.5)
WBC: 6.6 10*3/uL (ref 4.0–10.5)
nRBC: 0 % (ref 0.0–0.2)

## 2023-03-06 LAB — COMPREHENSIVE METABOLIC PANEL
ALT: 15 U/L (ref 0–44)
AST: 16 U/L (ref 15–41)
Albumin: 2.9 g/dL — ABNORMAL LOW (ref 3.5–5.0)
Alkaline Phosphatase: 60 U/L (ref 38–126)
Anion gap: 7 (ref 5–15)
BUN: 22 mg/dL (ref 8–23)
CO2: 29 mmol/L (ref 22–32)
Calcium: 8.9 mg/dL (ref 8.9–10.3)
Chloride: 103 mmol/L (ref 98–111)
Creatinine, Ser: 1.2 mg/dL (ref 0.61–1.24)
GFR, Estimated: >60 mL/min (ref >60)
Glucose, Bld: 114 mg/dL — ABNORMAL HIGH (ref 70–99)
Potassium: 4 mmol/L (ref 3.5–5.1)
Sodium: 139 mmol/L (ref 135–145)
Total Bilirubin: 0.5 mg/dL (ref 0.0–1.2)
Total Protein: 6.6 g/dL (ref 6.5–8.1)

## 2023-03-06 LAB — MAGNESIUM: Magnesium: 1.7 mg/dL (ref 1.7–2.4)

## 2023-03-06 MED ORDER — HEPARIN SOD (PORK) LOCK FLUSH 100 UNIT/ML IV SOLN
INTRAVENOUS | Status: AC
  Filled 2023-03-06: qty 5

## 2023-03-06 MED ORDER — HEPARIN SOD (PORK) LOCK FLUSH 100 UNIT/ML IV SOLN
500.0000 [IU] | Freq: Once | INTRAVENOUS | Status: AC
  Administered 2023-03-06: 500 [IU] via INTRAVENOUS

## 2023-03-06 MED ORDER — HEPARIN SOD (PORK) LOCK FLUSH 100 UNIT/ML IV SOLN: 500.0000 [IU] | Freq: Once | INTRAVENOUS | Status: DC

## 2023-03-06 MED ORDER — SODIUM CHLORIDE 0.9% FLUSH
10.0000 mL | Freq: Once | INTRAVENOUS | Status: AC
  Administered 2023-03-06: 10 mL via INTRAVENOUS

## 2023-03-06 MED ORDER — IOHEXOL 300 MG/ML  SOLN
60.0000 mL | Freq: Once | INTRAMUSCULAR | Status: AC | PRN
  Administered 2023-03-06: 60 mL via INTRAVENOUS

## 2023-03-06 MED ORDER — HEPARIN SODIUM (PORCINE) 10000 UNIT/ML IJ SOLN
1000.0000 [IU] | Freq: Once | INTRAMUSCULAR | Status: DC
  Filled 2023-03-06: qty 1

## 2023-03-06 NOTE — Progress Notes (Signed)
Radiation Xeloda completed, disenrolled.

## 2023-03-06 NOTE — Progress Notes (Signed)
Patients port flushed without difficulty.  Good blood return noted with no bruising or swelling noted at site. Power port needle left in per radiology/ultrasound request due to patient needing CT scans this morning. Power port needle removed by radiology/ultrasound nurse.  VSS with discharge and left in satisfactory condition with no s/s of distress noted. All follow ups as scheduled.       James Miles

## 2023-03-06 NOTE — Patient Instructions (Signed)

## 2023-03-12 ENCOUNTER — Ambulatory Visit (HOSPITAL_COMMUNITY)
Admission: RE | Admit: 2023-03-12 | Discharge: 2023-03-12 | Disposition: A | Discharge status: Home / Self Care | Payer: PPO | Source: Ambulatory Visit | Attending: Hematology | Admitting: Hematology

## 2023-03-12 DIAGNOSIS — C20 Malignant neoplasm of rectum: Secondary | ICD-10-CM | POA: Diagnosis present

## 2023-03-13 ENCOUNTER — Telehealth: Payer: Self-pay | Admitting: Urology

## 2023-03-13 ENCOUNTER — Ambulatory Visit: Payer: PPO

## 2023-03-13 ENCOUNTER — Ambulatory Visit: Payer: PPO | Admitting: Internal Medicine

## 2023-03-13 DIAGNOSIS — R310 Gross hematuria: Secondary | ICD-10-CM

## 2023-03-13 NOTE — Telephone Encounter (Signed)
Please schedule him on nv schedule. We have openings today.

## 2023-03-13 NOTE — Telephone Encounter (Signed)
Son states he thinks patients catheter may be clogged. Patient placed on nv schedule for catheter check.

## 2023-03-13 NOTE — Progress Notes (Addendum)
Patient came in today for bladder irrigation due to passing large hematuria via catheter. Patient was irrigated with 800 ml of sterile water until light pink.   ZOXWRUEA, CMA

## 2023-03-13 NOTE — Telephone Encounter (Signed)
Son said dad is in terrible pain. Thinks he needs irrigation to remove blood clots. Please advise what we can do. He said they sit at the ER for 5 hours last time.

## 2023-03-15 ENCOUNTER — Inpatient Hospital Stay: Payer: PPO | Admitting: Hematology

## 2023-03-15 NOTE — Progress Notes (Incomplete)
   [facility] [address], [place] [address]    Clinic Day:  03/15/2023  Referring physician: Bryna Car [name]  Patient Care Team: [place] as PCP - General Loyde Rule, MD as PCP - Cardiology ([address]

## 2023-03-21 ENCOUNTER — Inpatient Hospital Stay: Payer: PPO | Attending: Hematology | Admitting: Hematology

## 2023-03-21 ENCOUNTER — Ambulatory Visit: Payer: PPO | Admitting: Urology

## 2023-03-21 VITALS — BP 113/75 | HR 92

## 2023-03-21 VITALS — BP 151/86 | HR 82 | Temp 97.8°F | Resp 18 | Ht 71.0 in | Wt 171.0 lb

## 2023-03-21 DIAGNOSIS — J439 Emphysema, unspecified: Secondary | ICD-10-CM | POA: Insufficient documentation

## 2023-03-21 DIAGNOSIS — Z87891 Personal history of nicotine dependence: Secondary | ICD-10-CM | POA: Diagnosis not present

## 2023-03-21 DIAGNOSIS — C20 Malignant neoplasm of rectum: Secondary | ICD-10-CM | POA: Diagnosis present

## 2023-03-21 DIAGNOSIS — Z7901 Long term (current) use of anticoagulants: Secondary | ICD-10-CM | POA: Insufficient documentation

## 2023-03-21 DIAGNOSIS — R339 Retention of urine, unspecified: Secondary | ICD-10-CM

## 2023-03-21 DIAGNOSIS — I4892 Unspecified atrial flutter: Secondary | ICD-10-CM | POA: Diagnosis not present

## 2023-03-21 DIAGNOSIS — D4122 Neoplasm of uncertain behavior of left ureter: Secondary | ICD-10-CM | POA: Diagnosis not present

## 2023-03-21 DIAGNOSIS — D508 Other iron deficiency anemias: Secondary | ICD-10-CM

## 2023-03-21 DIAGNOSIS — R350 Frequency of micturition: Secondary | ICD-10-CM | POA: Diagnosis not present

## 2023-03-21 LAB — BLADDER SCAN AMB NON-IMAGING: Scan Result: 448

## 2023-03-21 MED ORDER — FINASTERIDE 5 MG PO TABS: 5.0000 mg | ORAL_TABLET | Freq: Every day | ORAL | 3 refills | Status: DC

## 2023-03-21 MED ORDER — SILODOSIN 8 MG PO CAPS: 8.0000 mg | ORAL_CAPSULE | Freq: Every day | ORAL | 11 refills | Status: DC

## 2023-03-21 MED ORDER — FUROSEMIDE 20 MG PO TABS: 20.0000 mg | ORAL_TABLET | Freq: Every day | ORAL | 2 refills | Status: DC | PRN

## 2023-03-21 MED ORDER — HYDROCODONE-ACETAMINOPHEN 5-325 MG PO TABS: 1.0000 | ORAL_TABLET | Freq: Four times a day (QID) | ORAL | 0 refills | Status: AC | PRN

## 2023-03-21 MED ORDER — CIPROFLOXACIN HCL 500 MG PO TABS
500.0000 mg | ORAL_TABLET | Freq: Once | ORAL | Status: AC
  Administered 2023-03-21: 500 mg via ORAL

## 2023-03-21 MED ORDER — TAMSULOSIN HCL 0.4 MG PO CAPS: 0.4000 mg | ORAL_CAPSULE | Freq: Two times a day (BID) | ORAL | 11 refills | Status: DC

## 2023-03-21 NOTE — Progress Notes (Signed)
[facility] [address] [place], [place] [address]    Clinic Day:  03/21/2023  Referring physician: [facility]  Patient Care Team: [name], Colleen Can, NP as PCP - General Wendall Stade, MD as PCP - Cardiology (Cardiology) Marinus Maw, MD as PCP - Electrophysiology (Cardiology) Therese Sarah, RN as Oncology Nurse Navigator (Medical Oncology) Doreatha Massed, MD as Medical Oncologist (Medical Oncology)   ASSESSMENT & PLAN:   Assessment: 1.  Stage II (T3 N0 M0) low rectal adenocarcinoma, MMR preserved: - 40-month history of intermittent rectal bleeding.  No weight loss. - Colonoscopy (04/26/2022): Large cauliflower mass palpated in the distal rectum on DRE.  Scattered diverticula in the descending colon.  5 mm polyp in the ascending colon.  In the rectum, exophytic semilunar neoplastic appearing process beginning at the anal verge and extending proximally about 6 cm. - Pathology (04/26/2022): Invasive moderately differentiated adenocarcinoma of the rectal mass.  MMR preserved. - CT CAP (05/01/2022): Nodular wall thickening along the rectum.  No intrinsic abnormal lymph nodes.  No liver lesion.  Fatty liver.  Nodule along the left side of the urinary bladder.  Small mass along the course of the distal left ureter.  Worrisome for multifocal TCC.  Small lung nodules measuring up to 4 mm. - MRI pelvis (05/03/2022): 6.4 cm left lateral low rectal tumor abutting the internal anal sphincter, T3c N0.  Distance from tumor to the internal anal sphincter is 0 cm.  Tumor measures 6.4 cm in length and up to 2.2 cm in thickness. - PET scan (05/11/2022): No evidence of metastatic disease. - Met with Dr. Maisie Fus on 05/16/2022: She is in agreement with TNT and has discussed APR with colostomy. - TNT: 8 cycles of FOLFOX from 06/07/2022 through 09/25/2022. - Chemoradiation therapy with Xeloda 1500 mg twice daily started on 12/28/2022 completed on 02/14/2023   2.   Social/family history: -He lives at home and is independent of ADLs and IADLs.  He is accompanied by his son today.  Worked at [employer] prior to retirement.  Also served in [place] but denied any exposure to agent orange.  Quit smoking 1 year ago.  Smoked 1 to 2 packs/day for the last 61 years.  Started at age 72. - No family history of malignancies.    Plan: 1.  Stage II (T3 cN0 M0) low rectal adenocarcinoma, MMR preserved: - CT chest and abdomen (03/06/2023): No evidence of metastatic disease.  Other benign findings discussed. - MRI pelvis (03/12/2023): Bulky low rectal mass is no longer seen with only mild left-sided rectal wall thickening.  Previously seen borderline enlarged left posterior mesorectal lymph nodes no longer seen.  No new or progressive disease. - Labs from 03/06/2023: Normal LFTs and creatinine.  CBC with normal white count and platelet count.  Hemoglobin 9.8 and improving. - I have recommended that he follow-up with Dr. Maisie Fus to discuss surgery option versus close surveillance. - If he chooses active surveillance, I will arrange for MRI of the pelvis in 3 months.  He will also require sigmoidoscopy and look at the previous tumor site and possible biopsy.   2.  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 - He is on Flomax and Proscar.  He reports bladder pain and increased frequency.  Continue close follow-up with Dr. Ronne Binning.   3.  A-flutter with RVR: - Rate well-controlled since  ablation.  Pulse rate today is 82.    Orders Placed This Encounter  Procedures   MR PELVIS WO CM RECTAL CA STAGING    Standing Status:   Future    Expected Date:   06/18/2023    Expiration Date:   03/20/2024    If indicated for the ordered procedure, I authorize the administration of contrast media per Radiology protocol:   Yes    What is the patient's  sedation requirement?:   No Sedation    Does the patient have a pacemaker or implanted devices?:   No    Preferred imaging location?:   [facility] (table limit - 550lbs)   CBC with Differential    Standing Status:   Future    Expected Date:   06/13/2023    Expiration Date:   03/20/2024   Comprehensive metabolic panel    Standing Status:   Future    Expected Date:   06/13/2023    Expiration Date:   03/20/2024   CEA    Standing Status:   Future    Expected Date:   06/13/2023    Expiration Date:   03/20/2024   Iron and TIBC ([facility] ONLY)    Standing Status:   Future    Expected Date:   06/13/2023    Expiration Date:   03/20/2024   Ferritin    Standing Status:   Future    Expected Date:   06/13/2023    Expiration Date:   03/20/2024      James Miles,acting as a scribe for Doreatha Massed, MD.,have documented all relevant documentation on the behalf of Doreatha Massed, MD,as directed by  Doreatha Massed, MD while in the presence of Doreatha Massed, MD.  I, Doreatha Massed MD, have reviewed the above documentation for accuracy and completeness, and I agree with the above.     Doreatha Massed, MD   2/12/20252:59 PM  CHIEF COMPLAINT:   Diagnosis: Rectal adenocarcinoma    Cancer Staging  Rectal adenocarcinoma [facility]) Staging form: Colon and Rectum, AJCC 8th Edition - Clinical stage from 05/07/2022: Stage IIA (cT3, cN0, cM0) - Unsigned    Prior Therapy: FOLFOX, 8 cycles, 06/07/22 - 09/25/22  Current Therapy:  Concurrent chemoradiation with Xeloda, to be followed by surgery    HISTORY OF PRESENT ILLNESS:   Oncology History  Rectal adenocarcinoma (HCC)  05/07/2022 Initial Diagnosis   Rectal adenocarcinoma (HCC)   06/07/2022 -  Chemotherapy   Patient is on Treatment Plan : COLORECTAL FOLFOX q14d x 4 months        INTERVAL HISTORY:   James Miles is a 78 y.o. male presenting to clinic today for follow up of Rectal adenocarcinoma. He was  last seen by me on 02/13/23.  Since his last visit, he underwent CT chest and abdomen on 03/06/23 that found: No evidence of metastatic disease in the chest or abdomen. Severe emphysema and diffuse bilateral bronchial wall thickening. Coronary artery disease.  James Miles also had MRI pelvis with rectal staging on 03/12/23 that found: Previously seen bulky low rectal mass is no longer seen, with only mild asymmetric left-sided rectal wall thickening noted at this location. Previously seen borderline enlarged left posterior mesorectal lymph node is no longer seen. No new or progressive disease identified. Diffuse bladder wall thickening and trabeculation appears mildly increased, and may be due to post radiation changes and/or chronic bladder outlet obstruction.  Of note, he was admitted to the hospital from 02/19/23 to 02/23/23 for a GI  hemorrhage. James Miles was transfused 2 units of PRBC's and GI recommended Miralax to prevent further bleeding. His UTI resolved while hospitalized.   Today, he states that he is doing well overall. His appetite level is at 85%. His energy level is at 40%.   PAST MEDICAL HISTORY:   Past Medical History: Past Medical History:  Diagnosis Date   Back pain    COPD (chronic obstructive pulmonary disease) (HCC)    Diabetes mellitus without complication (HCC)    Dyspnea    Dysrhythmia    Hyperlipidemia    Oxygen dependent    4L when needed    Surgical History: Past Surgical History:  Procedure Laterality Date   A-FLUTTER ABLATION N/A 08/23/2022   Procedure: A-FLUTTER ABLATION;  Surgeon: Marinus Maw, MD;  Location: [facility];  Service: Cardiovascular;  Laterality: N/A;   BIOPSY  04/26/2022   Procedure: BIOPSY;  Surgeon: Corbin Ade, MD;  Location: [facility] ENDO [facility];  Service: Endoscopy;;   BLADDER INSTILLATION N/A 11/16/2022   Procedure: BLADDER INSTILLATION- Gemcitibine;  Surgeon: Malen Gauze, MD;  Location: [facility] ORS;  Service: Urology;  Laterality:  N/A;   COLONOSCOPY WITH PROPOFOL N/A 04/26/2022   Procedure: COLONOSCOPY WITH PROPOFOL;  Surgeon: Corbin Ade, MD;  Location: [facility];  Service: Endoscopy;  Laterality: N/A;  10:00am; ASA 3   CYSTOSCOPY N/A 11/16/2022   Procedure: CYSTOSCOPY;  Surgeon: Malen Gauze, MD;  Location: [facility] ORS;  Service: Urology;  Laterality: N/A;   hemorhoidectomy     IR IMAGING GUIDED PORT INSERTION  05/12/2022   POLYPECTOMY  04/26/2022   Procedure: POLYPECTOMY;  Surgeon: Corbin Ade, MD;  Location: [facility];  Service: Endoscopy;;   TRANSURETHRAL RESECTION OF BLADDER TUMOR N/A 11/16/2022   Procedure: TRANSURETHRAL RESECTION OF BLADDER TUMOR (TURBT);  Surgeon: Malen Gauze, MD;  Location: [facility] OR[facility];  Service: Urology;  Laterality: N/A;    Social History: Social History   Socioeconomic History   Marital status: Widowed    Spouse name: Not on file   Number of children: 2   Years of education: Not on file   Highest education level: Not on file  Occupational History   Not on file  Tobacco Use   Smoking status: Former    Current packs/day: 0.00    Types: Cigarettes    Start date: 08/1966    Quit date: 08/2016    Years since quitting: 6.6   Smokeless tobacco: Never  Vaping Use   Vaping status: Never Used  Substance and Sexual Activity   Alcohol use: Never   Drug use: Never   Sexual activity: Not Currently  Other Topics Concern   Not on file  Social History Narrative   Not on file   Social Drivers of Health   Financial Resource Strain: Not on file  Food Insecurity: No Food Insecurity (02/19/2023)   Hunger Vital Sign    Worried About Running Out of Food in the Last Year: Never true    Ran Out of Food in the Last Year: Never true  Transportation Needs: No Transportation Needs (02/19/2023)   PRAPARE - Administrator, Civil Service (Medical): No    Lack of Transportation (Non-Medical): No  Physical Activity: Not on file  Stress: Not on file  Social  Connections: Unknown (02/19/2023)   Social Connection and Isolation Panel [NHANES]    Frequency of Communication with Friends and Family: Patient unable to answer    Frequency of Social Gatherings  with Friends and Family: Patient unable to answer    Attends Religious Services: Patient unable to answer    Active Member of Clubs or Organizations: Patient unable to answer    Attends Club or Organization Meetings: Patient unable to answer    Marital Status: Widowed  Intimate Partner Violence: Not At Risk (02/19/2023)   Humiliation, Afraid, Rape, and Kick questionnaire    Fear of Current or Ex-Partner: No    Emotionally Abused: No    Physically Abused: No    Sexually Abused: No    Family History: No family history on file.  Current Medications:  Current Outpatient Medications:    albuterol (VENTOLIN HFA) 108 (90 Base) MCG/ACT inhaler, Inhale 2 puffs into the lungs every 6 (six) hours as needed for wheezing or shortness of breath., Disp: , Rfl:    atorvastatin (LIPITOR) 20 MG tablet, Take 20 mg by mouth daily., Disp: , Rfl:    empagliflozin (JARDIANCE) 25 MG TABS tablet, Take 25 mg by mouth daily., Disp: , Rfl:    febuxostat (ULORIC) 40 MG tablet, Take 1 tablet (40 mg total) by mouth daily., Disp: 90 tablet, Rfl: 1   fenofibrate 54 MG tablet, Take 54 mg by mouth daily., Disp: , Rfl:    finasteride (PROSCAR) 5 MG tablet, Take 1 tablet (5 mg total) by mouth daily., Disp: 90 tablet, Rfl: 3   furosemide (LASIX) 20 MG tablet, Take 1 tablet (20 mg total) by mouth daily as needed., Disp: 30 tablet, Rfl: 2   loperamide (IMODIUM) 2 MG capsule, Take 2 mg by mouth 3 (three) times daily as needed for diarrhea or loose stools., Disp: , Rfl:    MELATONIN PO, Take 1 tablet by mouth at bedtime as needed (sleep)., Disp: , Rfl:    Misc. Devices MISC, Please provide with shower chair, Disp: 1 Units, Rfl: 0   polyethylene glycol (MIRALAX / GLYCOLAX) 17 g packet, Take 34 g by mouth daily., Disp: 60 each, Rfl: 2    tamsulosin (FLOMAX) 0.4 MG CAPS capsule, Take 1 capsule (0.4 mg total) by mouth 2 (two) times daily., Disp: 60 capsule, Rfl: 11   traMADol (ULTRAM) 50 MG tablet, Take 50 mg by mouth every 6 (six) hours as needed for moderate pain (pain score 4-6)., Disp: , Rfl:    Allergies: No Known Allergies  REVIEW OF SYSTEMS:   Review of Systems  Constitutional:  Negative for chills, fatigue and fever.  HENT:   Negative for lump/mass, mouth sores, nosebleeds, sore throat and trouble swallowing.   Eyes:  Negative for eye problems.  Respiratory:  Positive for cough and shortness of breath.   Cardiovascular:  Positive for leg swelling. Negative for chest pain and palpitations.  Gastrointestinal:  Positive for constipation and nausea. Negative for abdominal pain, diarrhea and vomiting.  Genitourinary:  Positive for frequency. Negative for bladder incontinence, difficulty urinating, dysuria, hematuria and nocturia.   Musculoskeletal:  Negative for arthralgias, back pain, flank pain, myalgias and neck pain.  Skin:  Negative for itching and rash.  Neurological:  Negative for dizziness, headaches and numbness.  Hematological:  Does not bruise/bleed easily.  Psychiatric/Behavioral:  Negative for depression, sleep disturbance and suicidal ideas. The patient is not nervous/anxious.   All other systems reviewed and are negative.    VITALS:   Blood pressure (!) 151/86, pulse 82, temperature 97.8 F (36.6 C), temperature source Tympanic, resp. rate 18, height 5\' 11"  (1.803 m), weight 171 lb (77.6 kg), SpO2 97%.  Wt Readings from Last 3  Encounters:  03/21/23 171 lb (77.6 kg)  02/23/23 150 lb 2.1 oz (68.1 kg)  02/13/23 158 lb 4.6 oz (71.8 kg)    Body mass index is 23.85 kg/m.  Performance status (ECOG): 1 - Symptomatic but completely ambulatory  PHYSICAL EXAM:   Physical Exam Vitals and nursing note reviewed. Exam conducted with a chaperone present.  Constitutional:      Appearance: Normal appearance.   Cardiovascular:     Rate and Rhythm: Normal rate and regular rhythm.     Pulses: Normal pulses.     Heart sounds: Normal heart sounds.  Pulmonary:     Effort: Pulmonary effort is normal.     Breath sounds: Normal breath sounds.  Abdominal:     Palpations: Abdomen is soft. There is no hepatomegaly, splenomegaly or mass.     Tenderness: There is no abdominal tenderness.  Musculoskeletal:     Right lower leg: Edema present.     Left lower leg: Edema present.  Lymphadenopathy:     Cervical: No cervical adenopathy.     Right cervical: No superficial, deep or posterior cervical adenopathy.    Left cervical: No superficial, deep or posterior cervical adenopathy.     Upper Body:     Right upper body: No supraclavicular or axillary adenopathy.     Left upper body: No supraclavicular or axillary adenopathy.  Neurological:     General: No focal deficit present.     Mental Status: He is alert and oriented to person, place, and time.  Psychiatric:        Mood and Affect: Mood normal.        Behavior: Behavior normal.     LABS:      Latest Ref Rng & Units 03/06/2023    9:51 AM 02/23/2023    6:29 AM 02/22/2023    5:00 AM  CBC  WBC 4.0 - 10.5 K/uL 6.6  8.4  7.3   Hemoglobin 13.0 - 17.0 g/dL 9.8  9.5  9.4   Hematocrit 39.0 - 52.0 % 32.2  31.2  29.5   Platelets 150 - 400 K/uL 312  385  378       Latest Ref Rng & Units 03/06/2023    9:51 AM 02/23/2023    6:29 AM 02/22/2023    5:00 AM  CMP  Glucose 70 - 99 mg/dL 782  956  213   BUN 8 - 23 mg/dL 22  28  40   Creatinine 0.61 - 1.24 mg/dL 0.86  5.78  4.69   Sodium 135 - 145 mmol/L 139  135  132   Potassium 3.5 - 5.1 mmol/L 4.0  5.0  4.9   Chloride 98 - 111 mmol/L 103  102  100   CO2 22 - 32 mmol/L 29  24  27    Calcium 8.9 - 10.3 mg/dL 8.9  8.3  8.0   Total Protein 6.5 - 8.1 g/dL 6.6     Total Bilirubin 0.0 - 1.2 mg/dL 0.5     Alkaline Phos 38 - 126 U/L 60     AST 15 - 41 U/L 16     ALT 0 - 44 U/L 15        Lab Results  Component  Value Date   CEA1 2.6 04/26/2022   /  CEA  Date Value Ref Range Status  04/26/2022 2.6 0.0 - 4.7 ng/mL Final    Comment:    (NOTE)  Nonsmokers          <3.9                             Smokers             <5.6 Roche [employer] Electrochemiluminescence Immunoassay (ECLIA) Values obtained with different assay methods or kits cannot be used interchangeably.  Results cannot be interpreted as absolute evidence of the presence or absence of malignant disease. Performed At: [facility] [address] [place], [place] [id number] Jolene Schimke MD JX:[id number]    No results found for: "PSA1" No results found for: "[id number]" No results found for: "CAN125"  No results found for: "TOTALPROTELP", "ALBUMINELP", "A1GS", "A2GS", "BETS", "BETA2SER", "GAMS", "MSPIKE", "SPEI" No results found for: "TIBC", "FERRITIN", "IRONPCTSAT" No results found for: "LDH"   STUDIES:   MR PELVIS WO CM RECTAL CA STAGING Result Date: 03/15/2023 CLINICAL DATA:  Follow-up rectal carcinoma.  Restaging. EXAM: MRI PELVIS WITHOUT CONTRAST TECHNIQUE: Multiplanar multisequence MR imaging of the pelvis was performed. No intravenous contrast was administered. COMPARISON:  MRI on 05/03/2022 and CT on 05/01/2022 FINDINGS: Lower Urinary Tract: Foley catheter seen within the bladder. Diffuse bladder wall thickening and trabeculation appears mildly increased, and may be due to post radiation changes and/or chronic bladder outlet obstruction. Bowel: Previously seen bulky low rectal mass involving the left anterolateral, and posterior walls is no longer seen, with only mild asymmetric rectal wall thickening noted at this location. Vascular/Lymphatic: Previously seen borderline enlarged left posterior mesorectal lymph node is no longer seen. No other lymphadenopathy identified. Reproductive:  Normal size prostate. Other: None. Musculoskeletal: No suspicious bone lesions identified. IMPRESSION: Previously  seen bulky low rectal mass is no longer seen, with only mild asymmetric left-sided rectal wall thickening noted at this location. Previously seen borderline enlarged left posterior mesorectal lymph node is no longer seen. No new or progressive disease identified. Diffuse bladder wall thickening and trabeculation appears mildly increased, and may be due to post radiation changes and/or chronic bladder outlet obstruction. Electronically Signed   By: Danae Orleans M.D.   On: 03/15/2023 16:24   CT ABDOMEN W CONTRAST Result Date: 03/14/2023 CLINICAL DATA:  Metastatic disease evaluation, rectal cancer * Tracking Code: BO * EXAM: CT CHEST AND ABDOMEN WITH CONTRAST TECHNIQUE: Multidetector CT imaging of the chest and abdomen was performed following the standard protocol during bolus administration of intravenous contrast. RADIATION DOSE REDUCTION: This exam was performed according to the departmental dose-optimization program which includes automated exposure control, adjustment of the mA and/or kV according to patient size and/or use of iterative reconstruction technique. CONTRAST:  60mL OMNIPAQUE IOHEXOL 300 MG/ML  SOLN COMPARISON:  CT abdomen pelvis, 03/02/2023, PET-CT, 05/11/2022 FINDINGS: CT CHEST FINDINGS Cardiovascular: Right chest port catheter. Aortic atherosclerosis. Normal heart size. Left coronary artery calcifications. No pericardial effusion. Mediastinum/Nodes: No enlarged mediastinal, hilar, or axillary lymph nodes. Thyroid gland, trachea, and esophagus demonstrate no significant findings. Lungs/Pleura: Severe emphysema. Diffuse bilateral bronchial wall thickening. Prominent azygous fissure. No pleural effusion or pneumothorax. Musculoskeletal: No chest wall abnormality. No acute osseous findings. CT ABDOMEN FINDINGS Hepatobiliary: No solid liver abnormality is seen. No gallstones, gallbladder wall thickening, or biliary dilatation. Pancreas: Unremarkable. No pancreatic ductal dilatation or surrounding  inflammatory changes. Spleen: Normal in size without significant abnormality. Adrenals/Urinary Tract: Adrenal glands are unremarkable. Kidneys are normal, without renal calculi, solid lesion, or hydronephrosis. Stomach/Bowel: Stomach is within normal limits. Appendix appears normal. No evidence of bowel wall thickening,  distention, or inflammatory changes. Vascular/Lymphatic: Aortic atherosclerosis. No enlarged abdominal lymph nodes. Other: Small fat containing umbilical hernia.  No ascites. Musculoskeletal: No acute osseous findings. IMPRESSION: 1. No evidence of metastatic disease in the chest or abdomen. 2. Severe emphysema and diffuse bilateral bronchial wall thickening. 3. Coronary artery disease. Aortic Atherosclerosis (ICD10-I70.0) and Emphysema (ICD10-J43.9). Electronically Signed   By: Jearld Lesch M.D.   On: 03/14/2023 08:57   CT CHEST W CONTRAST Result Date: 03/14/2023 CLINICAL DATA:  Metastatic disease evaluation, rectal cancer * Tracking Code: BO * EXAM: CT CHEST AND ABDOMEN WITH CONTRAST TECHNIQUE: Multidetector CT imaging of the chest and abdomen was performed following the standard protocol during bolus administration of intravenous contrast. RADIATION DOSE REDUCTION: This exam was performed according to the departmental dose-optimization program which includes automated exposure control, adjustment of the mA and/or kV according to patient size and/or use of iterative reconstruction technique. CONTRAST:  60mL OMNIPAQUE IOHEXOL 300 MG/ML  SOLN COMPARISON:  CT abdomen pelvis, 03/02/2023, PET-CT, 05/11/2022 FINDINGS: CT CHEST FINDINGS Cardiovascular: Right chest port catheter. Aortic atherosclerosis. Normal heart size. Left coronary artery calcifications. No pericardial effusion. Mediastinum/Nodes: No enlarged mediastinal, hilar, or axillary lymph nodes. Thyroid gland, trachea, and esophagus demonstrate no significant findings. Lungs/Pleura: Severe emphysema. Diffuse bilateral bronchial wall  thickening. Prominent azygous fissure. No pleural effusion or pneumothorax. Musculoskeletal: No chest wall abnormality. No acute osseous findings. CT ABDOMEN FINDINGS Hepatobiliary: No solid liver abnormality is seen. No gallstones, gallbladder wall thickening, or biliary dilatation. Pancreas: Unremarkable. No pancreatic ductal dilatation or surrounding inflammatory changes. Spleen: Normal in size without significant abnormality. Adrenals/Urinary Tract: Adrenal glands are unremarkable. Kidneys are normal, without renal calculi, solid lesion, or hydronephrosis. Stomach/Bowel: Stomach is within normal limits. Appendix appears normal. No evidence of bowel wall thickening, distention, or inflammatory changes. Vascular/Lymphatic: Aortic atherosclerosis. No enlarged abdominal lymph nodes. Other: Small fat containing umbilical hernia.  No ascites. Musculoskeletal: No acute osseous findings. IMPRESSION: 1. No evidence of metastatic disease in the chest or abdomen. 2. Severe emphysema and diffuse bilateral bronchial wall thickening. 3. Coronary artery disease. Aortic Atherosclerosis (ICD10-I70.0) and Emphysema (ICD10-J43.9). Electronically Signed   By: Jearld Lesch M.D.   On: 03/14/2023 08:57   CT ABDOMEN PELVIS WO CONTRAST Result Date: 02/20/2023 CLINICAL DATA:  Acute abdominal pain. Nonlocalized generalized abdominal tenderness. Rectal bleeding. Hematuria. EXAM: CT ABDOMEN AND PELVIS WITHOUT CONTRAST TECHNIQUE: Multidetector CT imaging of the abdomen and pelvis was performed following the standard protocol without IV contrast. RADIATION DOSE REDUCTION: This exam was performed according to the departmental dose-optimization program which includes automated exposure control, adjustment of the mA and/or kV according to patient size and/or use of iterative reconstruction technique. COMPARISON:  05/09/2022 FINDINGS: Lower chest: Mild patchy density at the dependent right lower lobe could be atelectasis or mild right base  pneumonia. Hepatobiliary: Liver parenchyma appears normal without contrast. No calcified gallstones. Pancreas: Normal Spleen: Normal Adrenals/Urinary Tract: Thick-walled bladder. Some air in the bladder, presumably secondary to recent catheterization. Probable cystitis. This is resulting in a degree of ureteral obstruction with dilated renal collecting systems and ureters on both sides. Nonobstructing 2 mm stone in the right kidney. No evidence of passing stone. No renal parenchymal mass. Stomach/Bowel: Stomach and small intestine are normal. There is diverticulosis of the sigmoid colon but no evidence of diverticulitis. One could question mild wall thickening of the rectum that could go along with mild proctitis. This is not definite. Vascular/Lymphatic: Aortic atherosclerosis. No aneurysm. IVC is normal. No adenopathy. Reproductive: Normal Other: No free  fluid or air. Musculoskeletal: Ordinary lower lumbar degenerative changes and osteoarthritis of the hips. IMPRESSION: 1. Thick-walled bladder. Some air in the bladder, presumably secondary to recent catheterization. Probable cystitis. This is resulting in a degree of ureteral obstruction with dilated renal collecting systems and ureters on both sides. Nonobstructing 2 mm stone in the right kidney. No evidence of passing stone. 2. Diverticulosis of the sigmoid colon without evidence of diverticulitis. One could question mild wall thickening of the rectum that could go along with mild proctitis. This is not definite. 3. Mild patchy density at the dependent right lower lobe could be atelectasis or mild right base pneumonia. Aortic Atherosclerosis (ICD10-I70.0). Electronically Signed   By: Paulina Fusi M.D.   On: 02/20/2023 16:20   US RENAL Result Date: 02/20/2023 CLINICAL DATA:  Acute kidney injury. EXAM: RENAL / URINARY TRACT ULTRASOUND COMPLETE COMPARISON:  CT scan 05/09/2022. FINDINGS: Right Kidney: Renal measurements: 12.4 x 6.0 x 5.9 cm = volume: 229 mL.  Moderate right renal collecting system dilatation. No perinephric fluid. Preserved parenchyma. Left Kidney: Renal measurements: 12.6 x 6.2 x 5.2 cm = volume: 214.5 mL. Moderate collecting system dilatation. No perinephric fluid. Preserved parenchyma. Bladder: Distended bladder with significant wall thickening measuring up to 11 mm. Prevoid volume of 294 cc. Other: Evaluation somewhat limited by patient has been difficulty with procedure as per the sonographer. IMPRESSION: Distended urinary bladder with a significant wall thickening. There is also moderate bilateral renal collecting system dilatation. Would recommend repeat study after decompression of the bladder to see if this persists or additional cross-sectional imaging workup with a CT when clinically appropriate Electronically Signed   By: Karen Kays M.D.   On: 02/20/2023 11:04

## 2023-03-21 NOTE — Progress Notes (Addendum)
Fill and Pull Catheter Removal  Patient is present today for a catheter removal.  of sterile water was instilled into the bladder when the patient felt the urge to urinate. 10ml of water was then drained from the balloon.  A 16FR foley cath was removed from the bladder no complications were noted .  Foley catheter intact and time of removal. Patient as then given some time to void on their own.  Patient cannot void  0ml on their own after some time.  Patient tolerated well.  One oral prophylactic antibiotic given per MD orders  Performed by: Guss Bunde, CMA  Follow up/ Additional notes: MD to see after   Bladder Scan completed today.  Patient cannot void prior to the bladder scan. Bladder scan result: 448 ml  Performed By: Guss Bunde, CMA  Additional notes-  MD to see after  Simple Catheter Placement  Due to urinary retention patient is present today for a foley cath placement.  Patient was cleaned and prepped in a sterile fashion with Betadinex3 . A 16 FR foley catheter was inserted, urine return was noted  , urine was Clear yellow in color.  The balloon was filled with 10cc of sterile water.  A leg bag was attached for drainage. Patient was also given a night bag to take home and was given instruction on how to change from one bag to another.  Patient was given instruction on proper catheter care.  Patient tolerated well, no complications were noted   Performed by: Guss Bunde, CMA  Additional notes/ Follow up:  as scheduled per MD instructions.

## 2023-03-21 NOTE — Patient Instructions (Addendum)
[facility] at [facility] Discharge Instructions   You were seen and examined today by Dr. Ellin Saba.  He reviewed the results of your lab work which are normal/stable. Your kidney function has improved to normal.   He reviewed the results of your CT scans. There is no evidence of cancer in the chest or abdomen.   He reviewed the results of your MRI of the pelvis which is showing the tumor has responded very well to treatment. There is no evidence of the rectal mass and lymph nodes on this exam.   We will refer you back to Dr. Maisie Fus (surgeon) for evaluation.   Return as scheduled.    Thank you for choosing [facility] at [facility] to provide your oncology and hematology care.  To afford each patient quality time with our provider, please arrive at least 15 minutes before your scheduled appointment time.   If you have a lab appointment with the Cancer Center please come in thru the Main Entrance and check in at the main information desk.  You need to re-schedule your appointment should you arrive 10 or more minutes late.  We strive to give you quality time with our providers, and arriving late affects you and other patients whose appointments are after yours.  Also, if you no show three or more times for appointments you may be dismissed from the clinic at the providers discretion.     Again, thank you for choosing [facility].  Our hope is that these requests will decrease the amount of time that you wait before being seen by our physicians.       _____________________________________________________________  Should you have questions after your visit to [facility], please contact our office at [phone] and follow the prompts.  Our office hours are 8:00 a.m. and 4:30 p.m. Monday - Friday.  Please note that voicemails left after 4:00 p.m. may not be returned until the following business day.  We are closed  weekends and major holidays.  You do have access to a nurse 24-7, just call the main number to the clinic [phone] and do not press any options, hold on the line and a nurse will answer the phone.    For prescription refill requests, have your pharmacy contact our office and allow 72 hours.    Due to Covid, you will need to wear a mask upon entering the hospital. If you do not have a mask, a mask will be given to you at the Main Entrance upon arrival. For doctor visits, patients may have 1 support person age 66 or older with them. For treatment visits, patients can not have anyone with them due to social distancing guidelines and our immunocompromised population.

## 2023-03-22 ENCOUNTER — Other Ambulatory Visit: Payer: Self-pay

## 2023-03-26 ENCOUNTER — Ambulatory Visit: Payer: PPO | Admitting: Internal Medicine

## 2023-04-04 ENCOUNTER — Ambulatory Visit: Payer: PPO | Admitting: Urology

## 2023-04-04 DIAGNOSIS — N400 Enlarged prostate without lower urinary tract symptoms: Secondary | ICD-10-CM

## 2023-04-04 DIAGNOSIS — R339 Retention of urine, unspecified: Secondary | ICD-10-CM | POA: Diagnosis not present

## 2023-04-04 MED ORDER — CIPROFLOXACIN HCL 500 MG PO TABS
500.0000 mg | ORAL_TABLET | Freq: Once | ORAL | Status: AC
  Administered 2023-04-04: 500 mg via ORAL

## 2023-04-04 NOTE — Progress Notes (Signed)
 Fill and Pull Catheter Removal  Patient is present today for a catheter removal.  70ml of sterile water was instilled into the bladder when the patient felt the urge to urinate. 10ml of water was then drained from the balloon.  A 16FR foley cath was removed from the bladder no complications were noted .  Foley catheter intact and time of removal. Patient as then given some time to void on their own.  Patient can void  20ml on their own after some time.  Patient tolerated well.  One oral prophylactic antibiotic given per MD orders  Performed by: Marchelle Folks RN  Follow up/ Additional notes: patient had bladder spasms throughout the voiding trial. Patient will return this afternoon for PVR and to see MD.

## 2023-04-04 NOTE — Progress Notes (Signed)
 04/04/2023 4:09 PM   James Miles 1945-08-05 [id number]  Referring provider: Kara Pacer, NP [address] [place],  [place] [address]  Urinary retention   HPI: James Miles is a 77yo here for followup for urinary retention. Voiding trial failed today. He is currently on rapaflo 8mg  daily   PMH: Past Medical History:  Diagnosis Date   Back pain    COPD (chronic obstructive pulmonary disease) (HCC)    Diabetes mellitus without complication (HCC)    Dyspnea    Dysrhythmia    Hyperlipidemia    Oxygen dependent    4L when needed    Surgical History: Past Surgical History:  Procedure Laterality Date   A-FLUTTER ABLATION N/A 08/23/2022   Procedure: A-FLUTTER ABLATION;  Surgeon: Marinus Maw, MD;  Location: [facility] INVASIVE CV [facility];  Service: Cardiovascular;  Laterality: N/A;   BIOPSY  04/26/2022   Procedure: BIOPSY;  Surgeon: Corbin Ade, MD;  Location: [facility] ENDO SU[facility];  Service: Endoscopy;;   BLADDER INSTILLATION N/A 11/16/2022   Procedure: BLADDER INSTILLATION- Gemcitibine;  Surgeon: Malen Gauze, MD;  Location: [facility] ORS;  Service: Urology;  Laterality: N/A;   COLONOSCOPY WITH PROPOFOL N/A 04/26/2022   Procedure: COLONOSCOPY WITH PROPOFOL;  Surgeon: Corbin Ade, MD;  Location: [facility];  Service: Endoscopy;  Laterality: N/A;  10:00am; ASA 3   CYSTOSCOPY N/A 11/16/2022   Procedure: CYSTOSCOPY;  Surgeon: Malen Gauze, MD;  Location: [facility] ORS;  Service: Urology;  Laterality: N/A;   hemorhoidectomy     IR IMAGING GUIDED PORT INSERTION  05/12/2022   POLYPECTOMY  04/26/2022   Procedure: POLYPECTOMY;  Surgeon: Corbin Ade, MD;  Location: [facility];  Service: Endoscopy;;   TRANSURETHRAL RESECTION OF BLADDER TUMOR N/A 11/16/2022   Procedure: TRANSURETHRAL RESECTION OF BLADDER TUMOR (TURBT);  Surgeon: Malen Gauze, MD;  Location: [facility] OR[facility];  Service: Urology;  Laterality: N/A;    Home Medications:  Allergies as of 04/04/2023    No Known Allergies      Medication List        Accurate as of April 04, 2023  4:09 PM. If you have any questions, ask your nurse or doctor.          albuterol 108 (90 Base) MCG/ACT inhaler Commonly known as: VENTOLIN HFA Inhale 2 puffs into the lungs every 6 (six) hours as needed for wheezing or shortness of breath.   atorvastatin 20 MG tablet Commonly known as: LIPITOR Take 20 mg by mouth daily.   empagliflozin 25 MG Tabs tablet Commonly known as: JARDIANCE Take 25 mg by mouth daily.   febuxostat 40 MG tablet Commonly known as: ULORIC Take 1 tablet (40 mg total) by mouth daily.   fenofibrate 54 MG tablet Take 54 mg by mouth daily.   finasteride 5 MG tablet Commonly known as: PROSCAR Take 1 tablet (5 mg total) by mouth daily.   furosemide 20 MG tablet Commonly known as: LASIX Take 1 tablet (20 mg total) by mouth daily as needed.   loperamide 2 MG capsule Commonly known as: IMODIUM Take 2 mg by mouth 3 (three) times daily as needed for diarrhea or loose stools.   MELATONIN PO Take 1 tablet by mouth at bedtime as needed (sleep).   Misc. Devices Misc Please provide with shower chair   polyethylene glycol 17 g packet Commonly known as: MIRALAX / GLYCOLAX Take 34 g by mouth daily.   silodosin 8 MG Caps capsule Commonly known  as: RAPAFLO Take 1 capsule (8 mg total) by mouth daily with breakfast.   traMADol 50 MG tablet Commonly known as: ULTRAM Take 50 mg by mouth every 6 (six) hours as needed for moderate pain (pain score 4-6).        Allergies: No Known Allergies  Family History: No family history on file.  Social History:  reports that he quit smoking about 6 years ago. His smoking use included cigarettes. He started smoking about 56 years ago. He has never used smokeless tobacco. He reports that he does not drink alcohol and does not use drugs.  ROS: All other review of systems were reviewed and are negative except what is noted above in  HPI  Physical Exam: There were no vitals taken for this visit.  Constitutional:  Alert and oriented, No acute distress. HEENT: James Miles AT, moist mucus membranes.  Trachea midline, no masses. Cardiovascular: No clubbing, cyanosis, or edema. Respiratory: Normal respiratory effort, no increased work of breathing. GI: Abdomen is soft, nontender, nondistended, no abdominal masses GU: No CVA tenderness.  Lymph: No cervical or inguinal lymphadenopathy. Skin: No rashes, bruises or suspicious lesions. Neurologic: Grossly intact, no focal deficits, moving all 4 extremities. Psychiatric: Normal mood and affect.  Laboratory Data: Lab Results  Component Value Date   WBC 6.6 03/06/2023   HGB 9.8 (L) 03/06/2023   HCT 32.2 (L) 03/06/2023   MCV 97.9 03/06/2023   PLT 312 03/06/2023    Lab Results  Component Value Date   CREATININE 1.20 03/06/2023    Lab Results  Component Value Date   PSA 3.1 10/03/2018   PSA 1.7 10/03/2017    No results found for: "TESTOSTERONE"  Lab Results  Component Value Date   HGBA1C 7.8 (H) 07/08/2022    Urinalysis    Component Value Date/Time   COLORURINE YELLOW 02/19/2023 1701   APPEARANCEUR CLOUDY (A) 02/19/2023 1701   APPEARANCEUR Cloudy (A) 10/18/2022 1513   LABSPEC 1.009 02/19/2023 1701   PHURINE 7.0 02/19/2023 1701   GLUCOSEU 150 (A) 02/19/2023 1701   HGBUR LARGE (A) 02/19/2023 1701   BILIRUBINUR NEGATIVE 02/19/2023 1701   BILIRUBINUR Negative 10/18/2022 1513   KETONESUR NEGATIVE 02/19/2023 1701   PROTEINUR 100 (A) 02/19/2023 1701   NITRITE NEGATIVE 02/19/2023 1701   LEUKOCYTESUR LARGE (A) 02/19/2023 1701    Lab Results  Component Value Date   LABMICR See below: 10/18/2022   WBCUA 11-30 (A) 10/18/2022   LABEPIT 0-10 10/18/2022   BACTERIA RARE (A) 02/19/2023    Pertinent Imaging:  No results found for this or any previous visit.  Results for orders placed during the hospital encounter of 10/07/18  US Venous Img Lower  Bilateral  Narrative CLINICAL DATA:  Bilateral lower extremity swelling.  EXAM: BILATERAL LOWER EXTREMITY VENOUS DOPPLER ULTRASOUND  TECHNIQUE: Gray-scale sonography with graded compression, as well as color Doppler and duplex ultrasound were performed to evaluate the lower extremity deep venous systems from the level of the common femoral vein and including the common femoral, femoral, profunda femoral, popliteal and calf veins including the posterior tibial, peroneal and gastrocnemius veins when visible. The superficial great saphenous vein was also interrogated. Spectral Doppler was utilized to evaluate flow at rest and with distal augmentation maneuvers in the common femoral, femoral and popliteal veins.  COMPARISON:  None.  FINDINGS: RIGHT LOWER EXTREMITY  Common Femoral Vein: No evidence of thrombus. Normal compressibility, respiratory phasicity and response to augmentation.  Saphenofemoral Junction: No evidence of thrombus. Normal compressibility and flow on color  Doppler imaging.  Profunda Femoral Vein: No evidence of thrombus. Normal compressibility and flow on color Doppler imaging.  Femoral Vein: No evidence of thrombus. Normal compressibility, respiratory phasicity and response to augmentation.  Popliteal Vein: No evidence of thrombus. Normal compressibility, respiratory phasicity and response to augmentation.  Calf Veins: No evidence of thrombus. Normal compressibility and flow on color Doppler imaging.  Superficial Great Saphenous Vein: No evidence of thrombus. Normal compressibility.  Other Findings:  None.  LEFT LOWER EXTREMITY  Common Femoral Vein: No evidence of thrombus. Normal compressibility, respiratory phasicity and response to augmentation.  Saphenofemoral Junction: No evidence of thrombus. Normal compressibility and flow on color Doppler imaging.  Profunda Femoral Vein: No evidence of thrombus. Normal compressibility and flow on color  Doppler imaging.  Femoral Vein: No evidence of thrombus. Normal compressibility, respiratory phasicity and response to augmentation.  Popliteal Vein: No evidence of thrombus. Normal compressibility, respiratory phasicity and response to augmentation.  Calf Veins: No evidence of thrombus. Normal compressibility and flow on color Doppler imaging.  Other Findings: None.  IMPRESSION: No evidence of deep venous thrombosis in either lower extremity.   Electronically Signed By: Maisie Fus  Register On: 10/07/2018 14:00  No results found for this or any previous visit.  No results found for this or any previous visit.  Results for orders placed during the hospital encounter of 02/19/23  US RENAL  Narrative CLINICAL DATA:  Acute kidney injury.  EXAM: RENAL / URINARY TRACT ULTRASOUND COMPLETE  COMPARISON:  CT scan 05/09/2022.  FINDINGS: Right Kidney:  Renal measurements: 12.4 x 6.0 x 5.9 cm = volume: 229 mL. Moderate right renal collecting system dilatation. No perinephric fluid. Preserved parenchyma.  Left Kidney:  Renal measurements: 12.6 x 6.2 x 5.2 cm = volume: 214.5 mL. Moderate collecting system dilatation. No perinephric fluid. Preserved parenchyma.  Bladder:  Distended bladder with significant wall thickening measuring up to 11 mm. Prevoid volume of 294 cc.  Other:  Evaluation somewhat limited by patient has been difficulty with procedure as per the sonographer.  IMPRESSION: Distended urinary bladder with a significant wall thickening. There is also moderate bilateral renal collecting system dilatation. Would recommend repeat study after decompression of the bladder to see if this persists or additional cross-sectional imaging workup with a CT when clinically appropriate   Electronically Signed By: Karen Kays M.D. On: 02/20/2023 11:04  No results found for this or any previous visit.  Results for orders placed during the hospital encounter of  05/09/22  CT HEMATURIA WORKUP  Narrative CLINICAL DATA:  Hematuria. Bladder mass on recent MRI. Rectal carcinoma. * Tracking Code: BO *  EXAM: CT ABDOMEN AND PELVIS WITHOUT AND WITH CONTRAST  TECHNIQUE: Multidetector CT imaging of the abdomen and pelvis was performed following the standard protocol before and following the bolus administration of intravenous contrast.  RADIATION DOSE REDUCTION: This exam was performed according to the departmental dose-optimization program which includes automated exposure control, adjustment of the mA and/or kV according to patient size and/or use of iterative reconstruction technique.  CONTRAST:  OMNIPAQUE IOHEXOL 300 MG/ML  SOLN  COMPARISON:  05/01/2022  FINDINGS: Lower Chest: No acute findings.  Hepatobiliary: No hepatic masses identified. Gallbladder is unremarkable. No evidence of biliary ductal dilatation.  Pancreas:  No mass or inflammatory changes.  Spleen: Within normal limits in size and appearance.  Adrenals/Urinary Tract: No adrenal masses identified. Few tiny 1-2 mm calculi are noted in both kidneys. No evidence of ureteral calculi or hydronephrosis. No suspicious renal masses identified.  No masses seen involving the collecting systems or ureters. Mild diffuse bladder wall thickening and trabeculation is seen which may be due to chronic bladder outlet obstruction or cystitis. A focal enhancing soft tissue nodule measuring approximately 1.2 cm is seen along the left lateral bladder wall, suspicious for bladder carcinoma.  Stomach/Bowel: Concentric rectal wall thickening and enhancement is seen, consistent with known primary rectal carcinoma. No evidence of obstruction, inflammatory process or abnormal fluid collections. Normal appendix visualized. Diverticulosis is seen mainly involving the descending and sigmoid colon, however there is no evidence of diverticulitis.  Vascular/Lymphatic: No pathologically  enlarged lymph nodes. No acute vascular findings. Aortic atherosclerotic calcification incidentally noted. 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  Reproductive:  No mass or other significant abnormality.  Other:  None.  Musculoskeletal:  No suspicious bone lesions identified.  IMPRESSION: 1.2 cm focal enhancing soft tissue nodule along the left lateral bladder wall, suspicious for bladder carcinoma.  Low rectal soft tissue mass, consistent with known primary rectal carcinoma.  No evidence of metastatic disease within the abdomen or pelvis.  Mild diffuse bladder wall thickening and trabeculation, which may be due to chronic bladder outlet obstruction or cystitis.  Tiny nonobstructing bilateral renal calculi.  Colonic diverticulosis, without radiographic evidence of diverticulitis.  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  Aortic Atherosclerosis (ICD10-I70.0).   Electronically Signed By: Danae Orleans M.D. On: 05/09/2022 10:58  No results found for this or any previous visit.   Assessment & Plan:    1. Urinary retention (Primary) We discussed the management of his BPH including continued medical therapy, Rezum, Urolift, TURP and simple prostatectomy. After discussing the options the patient has elected to proceed with Urolift. Risks/benefits/alternatives discussed.  - Bladder Voiding Trial - ciprofloxacin (CIPRO) tablet 500 mg   No follow-ups on file.  Wilkie Aye, MD  [facility] 

## 2023-04-04 NOTE — Progress Notes (Signed)
 Simple Catheter Placement  Due to urinary retention patient is present today for a foley cath placement.  Patient was cleaned and prepped in a sterile fashion with Betadinex3 . A 16 FR foley catheter was inserted, urine return was noted  , urine was Clear yellow in color.  The balloon was filled with 10cc of sterile water.  A leg bag was attached for drainage. Patient was also given a night bag to take home and was given instruction on how to change from one bag to another.  Patient was given instruction on proper catheter care.  Patient tolerated well, no complications were noted   Performed by: Marchelle Folks RN  Additional notes/ Follow up:  MD to see patient in office

## 2023-04-09 ENCOUNTER — Other Ambulatory Visit: Payer: Self-pay | Admitting: Urology

## 2023-04-10 ENCOUNTER — Encounter: Payer: Self-pay | Admitting: Urology

## 2023-04-10 NOTE — Patient Instructions (Signed)

## 2023-04-13 ENCOUNTER — Other Ambulatory Visit: Payer: Self-pay | Admitting: Urology

## 2023-04-13 ENCOUNTER — Telehealth: Payer: Self-pay

## 2023-04-13 DIAGNOSIS — N401 Enlarged prostate with lower urinary tract symptoms: Secondary | ICD-10-CM

## 2023-04-13 NOTE — Telephone Encounter (Signed)
 Patient is asking if he can have a refill of his pain medication.  He states he is still having pain when he passes clots through his catheter.  He rates his pain around an 8 out of 10.  His son is still irrigating catheter to clear the clots.

## 2023-04-13 NOTE — Telephone Encounter (Signed)
 Patient's son called regarding surgery.  Please submit surgery request for urolift.  Thank you.

## 2023-04-17 ENCOUNTER — Telehealth: Payer: Self-pay | Admitting: *Deleted

## 2023-04-17 NOTE — Telephone Encounter (Signed)
   Pre-operative Risk Assessment    Patient Name: James Miles  DOB: August 13, 1945 MRN: [id number]   Date of last office visit: 09/2022 Date of next office visit: 05/01/2023   APPT INFO ALREADY UPDATED   Request for Surgical Clearance    Procedure:   UROLIFT PROCEDURE  Date of Surgery:  Clearance 05/28/23                                Surgeon:  DR. Ronne Binning Surgeon's Group or Practice Name:  [facility] Phone number:  [phone] Fax number:  [phone]   Type of Clearance Requested:   - Medical    Type of Anesthesia:  General    Additional requests/questions:    [name]   04/17/2023, 11:33 AM

## 2023-04-18 NOTE — Telephone Encounter (Signed)
   Name: TALLEY KREISER  DOB: 1945/10/04  MRN: [id number]  Primary Cardiologist: Charlton Haws, MD  Chart reviewed as part of pre-operative protocol coverage. The patient has an upcoming visit scheduled with Dr. Ladona Ridgel on 05/01/2023 at which time clearance can be addressed in case there are any issues that would impact surgical recommendations.  Urolift procedure Is not scheduled until 05/28/2023 as below. I added preop FYI to appointment note so that provider is aware to address at time of outpatient visit.  Per office protocol the cardiology provider should forward their finalized clearance decision and recommendations regarding antiplatelet therapy to the requesting party below.    I will route this message as FYI to requesting party and remove this message from the preop box as separate preop APP input not needed at this time.   Please call with any questions.  Denyce Robert, NP  04/18/2023, 10:33 AM

## 2023-05-01 ENCOUNTER — Ambulatory Visit: Payer: PPO | Admitting: Internal Medicine

## 2023-05-04 ENCOUNTER — Ambulatory Visit: Admitting: Family Medicine

## 2023-05-04 NOTE — Progress Notes (Signed)
 Cardiology Office Note:  .   Date:  05/07/2023  ID:  James Miles, DOB 19-Dec-1945, MRN [id number] PCP: James Pacer, NP  [facility] Providers Cardiologist:  James Haws, MD Electrophysiologist:  James Bunting, MD    Patient Profile: .      PMH Atrial flutter on chronic anticoagulation S/p catheter ablation Type 2 diabetes mellitus COPD on home oxygen Former tobacco abuse Quit smoking in 2021 NICM TTE 07/08/2022 EF 40-45 Likely tachycardia mediated TTE 10/25/2022 EF 60-65, G1DD Borderline dilatation of aortic root 37 mm Rectal adenocarcinoma  Referred to cardiology and seen in A Fib clinic 07/24/2022 following admission for atrial flutter. His HR was noted to be elevated in 150s in cancer center, thought to be SVT and given adenosine and later noted to be atrial flutter. He was discharged on amiodarone and Eliquis 5 mg twice daily for stroke prevention. He is undergoing chemotherapy for rectal adenocarcinoma.  Admission 6/26-6/28/24 for atrial flutter RVR. Recent admission to [facility] for similar complaints. He presented from cancer center with HR in the 140s. Initially hypotensive on amiodarone, BP improved with the slowing of HR. He was planning for EP study and a flutter ablation. He was switched from Eliquis to Xarelto for once daily dosing.  He underwent EPS/RFCA with Dr. Ladona Miles on 08/23/22. He had frequent PVCs on telemetry following procedure.   Last cardiology clinic visit was 09/21/2022 with Dr. Ladona Miles. He reported dyspnea improved following procedure. He was maintaining NSR at that appointment and amiodarone was stopped. He planned to d/c OAC at the end of the current Rx.        History of Present Illness: .   James Miles is a very pleasant 78 y.o. male who is here today for preoperative cardiac evaluation. He is here with his son with whom he lives, is traveling in a wheelchair due to ease but is able to walk short distances without  assistance. Reports he has always been well with the exception of rectal and bladder cancer.  He is bothered by the catheter bag. No hematuria. His activities at home generally include ADLs and light housework.  He is able to achieve > 4 METS activity without concerning cardiac symptoms.  He reports he was completely asymptomatic when his heart rate was fast with a flutter. He has chronic shortness of breath secondary to COPD. Has mild LE edema. He denies chest pain, palpitations, orthopnea, PND, presyncope, syncope.    Discussed the use of AI scribe software for clinical note transcription with the patient, who gave verbal consent to proceed.   ROS: See HPI       Studies Reviewed: Marland Kitchen   EKG Interpretation Date/Time:  Monday May 07 2023 15:18:20 EDT Ventricular Rate:  69 PR Interval:  138 QRS Duration:  92 QT Interval:  410 QTC Calculation: 439 R Axis:   23  Text Interpretation: Sinus rhythm with frequent and consecutive Premature ventricular complexes When compared with ECG of 20-Feb-2023 10:55, Premature ventricular complexes are now Present Premature supraventricular complexes are no longer Present Confirmed by James Miles [phone]) on 05/07/2023 3:20:44 PM     No results found for: "LIPOA"   Risk Assessment/Calculations:    CHA2DS2-VASc Score = 4   This indicates a 4.8% annual risk of stroke. The patient's score is based upon: CHF History: 1 HTN History: 0 Diabetes History: 1 Stroke History: 0 Vascular Disease History: 0 Age Score: 2 Gender Score: 0  Physical Exam:   VS:  BP 118/62   Pulse 65   Ht 5\' 11"  (1.803 m)   Wt 150 lb 6.4 oz (68.2 kg)   SpO2 94%   BMI 20.98 kg/m    Wt Readings from Last 3 Encounters:  05/07/23 150 lb 6.4 oz (68.2 kg)  03/21/23 171 lb (77.6 kg)  02/23/23 150 lb 2.1 oz (68.1 kg)    GEN: Well nourished, well developed in no acute distress NECK: No JVD; No carotid bruits CARDIAC: Irregular RR, no murmurs, rubs,  gallops RESPIRATORY:  Clear to auscultation without rales, wheezing or rhonchi  ABDOMEN: Soft, non-tender, non-distended EXTREMITIES:  No edema; No deformity     ASSESSMENT AND PLAN: .    Preop cardiac evaluation: According to the Revised Cardiac Risk Index (RCRI), his Perioperative Risk of Major Cardiac Event is (%): 0.9. His Functional Capacity in METs is: 4.06 according to the Duke Activity Status Index (DASI). The patient is doing well from a cardiac perspective. Therefore, based on ACC/AHA guidelines, the patient would be at acceptable risk for the planned procedure without further cardiovascular testing.   Atrial flutter s/p ablation: He remains in sinus rhythm today with well controlled HR.  He is no longer on oral anticoagulation.  No acute concerns today.   Frequent PVCs: EKG reveals normal sinus rhythm at 69 bpm with frequent PVCs. He denies palpitations, is completely asymptomatic. No longer on AV nodal blocking agents. HR is well controlled.   COPD:  COPD contributes to exertional dyspnea, but there are no acute exacerbations. He denies orthopnea or PND. Requests recommendations for a new PCP.   NICM/Peripheral edema: Experiences bilateral feet swelling which is not evident on exam today. Likely secondary to sedentary lifestyle and diet. DOE with moderate exertion, no orthopnea or PND. Normal LVEF, G1 DD, normal RV, no significant hypertrophy on echo 10/25/2022.        Disposition:6 months with Dr. Eden Miles  Signed, James Bridegroom, NP-C

## 2023-05-07 ENCOUNTER — Encounter: Payer: Self-pay | Admitting: Nurse Practitioner

## 2023-05-07 ENCOUNTER — Ambulatory Visit: Attending: Nurse Practitioner | Admitting: Nurse Practitioner

## 2023-05-07 VITALS — BP 118/62 | HR 65 | Ht 71.0 in | Wt 150.4 lb

## 2023-05-07 DIAGNOSIS — Z9889 Other specified postprocedural states: Secondary | ICD-10-CM | POA: Diagnosis not present

## 2023-05-07 DIAGNOSIS — I428 Other cardiomyopathies: Secondary | ICD-10-CM

## 2023-05-07 DIAGNOSIS — Z8679 Personal history of other diseases of the circulatory system: Secondary | ICD-10-CM

## 2023-05-07 DIAGNOSIS — Z01818 Encounter for other preprocedural examination: Secondary | ICD-10-CM

## 2023-05-07 DIAGNOSIS — R600 Localized edema: Secondary | ICD-10-CM

## 2023-05-07 DIAGNOSIS — I493 Ventricular premature depolarization: Secondary | ICD-10-CM | POA: Diagnosis not present

## 2023-05-07 DIAGNOSIS — J449 Chronic obstructive pulmonary disease, unspecified: Secondary | ICD-10-CM

## 2023-05-07 NOTE — Patient Instructions (Addendum)
 Medication Instructions:   Your physician recommends that you continue on your current medications as directed. Please refer to the Current Medication list given to you today.   *If you need a refill on your cardiac medications before your next appointment, please call your pharmacy*  Lab Work:  None ordered.  If you have labs (blood work) drawn today and your tests are completely normal, you will receive your results only by: MyChart Message (if you have MyChart) OR A paper copy in the mail If you have any lab test that is abnormal or we need to change your treatment, we will call you to review the results.  Testing/Procedures:  None ordered.  Follow-Up: At [facility], you and your health needs are our priority.  As part of our continuing mission to provide you with exceptional heart care, our providers are all part of one team.  This team includes your primary Cardiologist (physician) and Advanced Practice Providers or APPs (Physician Assistants and Nurse Practitioners) who all work together to provide you with the care you need, when you need it.  Your next appointment:   6 month(s)  Provider:   Charlton Haws, MD    We recommend signing up for the patient portal called "MyChart".  Sign up information is provided on this After Visit Summary.  MyChart is used to connect with patients for Virtual Visits (Telemedicine).  Patients are able to view lab/test results, encounter notes, upcoming appointments, etc.  Non-urgent messages can be sent to your provider as well.   To learn more about what you can do with MyChart, go to [email or web address].   Other Instructions  Primary Care Doctors to contact:  [facility].       1st Floor: - Lobby - Registration  - Pharmacy  - Lab - Cafe  2nd Floor: - PV Lab - Diagnostic Testing (echo, CT, nuclear med)  3rd Floor: - Vacant  4th Floor: - TCTS  (cardiothoracic surgery) - AFib Clinic - Structural Heart Clinic - Vascular Surgery  - Vascular Ultrasound  5th Floor: - HeartCare Cardiology (general and EP) - Clinical Pharmacy for coumadin, hypertension, lipid, weight-loss medications, and med management appointments    Valet parking services will be available as well.

## 2023-05-08 ENCOUNTER — Other Ambulatory Visit: Payer: Self-pay

## 2023-05-21 ENCOUNTER — Other Ambulatory Visit: Payer: Self-pay

## 2023-05-21 ENCOUNTER — Encounter (HOSPITAL_COMMUNITY): Payer: Self-pay

## 2023-05-23 ENCOUNTER — Encounter (HOSPITAL_COMMUNITY)
Admission: RE | Admit: 2023-05-23 | Discharge: 2023-05-23 | Disposition: A | Discharge status: Home / Self Care | Source: Ambulatory Visit | Attending: Urology | Admitting: Urology

## 2023-05-28 ENCOUNTER — Ambulatory Visit (HOSPITAL_COMMUNITY)
Admission: RE | Admit: 2023-05-28 | Discharge: 2023-05-28 | Disposition: A | Discharge status: Home / Self Care | Attending: Urology | Admitting: Urology

## 2023-05-28 ENCOUNTER — Encounter (HOSPITAL_COMMUNITY)
Admission: RE | Disposition: A | Discharge status: Home / Self Care | Payer: Self-pay | Source: Home / Self Care | Attending: Urology

## 2023-05-28 ENCOUNTER — Encounter (HOSPITAL_COMMUNITY): Payer: Self-pay | Admitting: Urology

## 2023-05-28 ENCOUNTER — Ambulatory Visit (HOSPITAL_BASED_OUTPATIENT_CLINIC_OR_DEPARTMENT_OTHER)

## 2023-05-28 ENCOUNTER — Ambulatory Visit (HOSPITAL_COMMUNITY)

## 2023-05-28 ENCOUNTER — Other Ambulatory Visit: Payer: Self-pay

## 2023-05-28 DIAGNOSIS — N401 Enlarged prostate with lower urinary tract symptoms: Secondary | ICD-10-CM

## 2023-05-28 DIAGNOSIS — J449 Chronic obstructive pulmonary disease, unspecified: Secondary | ICD-10-CM | POA: Diagnosis not present

## 2023-05-28 DIAGNOSIS — N138 Other obstructive and reflux uropathy: Secondary | ICD-10-CM

## 2023-05-28 DIAGNOSIS — I10 Essential (primary) hypertension: Secondary | ICD-10-CM | POA: Diagnosis not present

## 2023-05-28 DIAGNOSIS — R338 Other retention of urine: Secondary | ICD-10-CM | POA: Diagnosis not present

## 2023-05-28 DIAGNOSIS — E119 Type 2 diabetes mellitus without complications: Secondary | ICD-10-CM | POA: Insufficient documentation

## 2023-05-28 DIAGNOSIS — Z87891 Personal history of nicotine dependence: Secondary | ICD-10-CM | POA: Insufficient documentation

## 2023-05-28 HISTORY — PX: CYSTOSCOPY WITH INSERTION OF UROLIFT: SHX6678

## 2023-05-28 LAB — GLUCOSE, CAPILLARY: Glucose-Capillary: 103 mg/dL — ABNORMAL HIGH (ref 70–99)

## 2023-05-28 SURGERY — CYSTOSCOPY WITH INSERTION OF UROLIFT
Anesthesia: General | Site: Bladder

## 2023-05-28 MED ORDER — PHENYLEPHRINE 80 MCG/ML (10ML) SYRINGE FOR IV PUSH (FOR BLOOD PRESSURE SUPPORT)
PREFILLED_SYRINGE | INTRAVENOUS | Status: DC | PRN
  Administered 2023-05-28: 160 ug via INTRAVENOUS
  Administered 2023-05-28: 80 ug via INTRAVENOUS

## 2023-05-28 MED ORDER — WATER FOR IRRIGATION, STERILE IR SOLN
Status: DC | PRN
Start: 2023-05-28 — End: 2023-05-30
  Administered 2023-05-28: 1000 mL
  Administered 2023-05-28: 3000 mL

## 2023-05-28 MED ORDER — DEXAMETHASONE SODIUM PHOSPHATE 10 MG/ML IJ SOLN
INTRAMUSCULAR | Status: AC
  Filled 2023-05-28: qty 1

## 2023-05-28 MED ORDER — ORAL CARE MOUTH RINSE: 15.0000 mL | Freq: Once | OROMUCOSAL | Status: AC

## 2023-05-28 MED ORDER — ONDANSETRON HCL 4 MG/2ML IJ SOLN
INTRAMUSCULAR | Status: DC | PRN
  Administered 2023-05-28: 4 mg via INTRAVENOUS

## 2023-05-28 MED ORDER — HYDROCODONE-ACETAMINOPHEN 5-325 MG PO TABS
1.0000 | ORAL_TABLET | Freq: Four times a day (QID) | ORAL | 0 refills | Status: DC | PRN
Start: 2023-05-28 — End: 2024-01-01

## 2023-05-28 MED ORDER — CHLORHEXIDINE GLUCONATE 0.12 % MT SOLN
15.0000 mL | Freq: Once | OROMUCOSAL | Status: AC
  Administered 2023-05-28: 15 mL via OROMUCOSAL

## 2023-05-28 MED ORDER — PROPOFOL 10 MG/ML IV BOLUS
INTRAVENOUS | Status: DC | PRN
  Administered 2023-05-28: 80 mg via INTRAVENOUS
  Administered 2023-05-28: 30 mg via INTRAVENOUS
  Administered 2023-05-28: 20 mg via INTRAVENOUS

## 2023-05-28 MED ORDER — DEXAMETHASONE SODIUM PHOSPHATE 10 MG/ML IJ SOLN
INTRAMUSCULAR | Status: DC | PRN
  Administered 2023-05-28: 10 mg via INTRAVENOUS

## 2023-05-28 MED ORDER — LACTATED RINGERS IV SOLN: INTRAVENOUS | Status: DC

## 2023-05-28 MED ORDER — PHENYLEPHRINE 80 MCG/ML (10ML) SYRINGE FOR IV PUSH (FOR BLOOD PRESSURE SUPPORT)
PREFILLED_SYRINGE | INTRAVENOUS | Status: AC
  Filled 2023-05-28: qty 10

## 2023-05-28 MED ORDER — LIDOCAINE HCL (PF) 2 % IJ SOLN
INTRAMUSCULAR | Status: DC | PRN
  Administered 2023-05-28: 60 mg via INTRADERMAL

## 2023-05-28 MED ORDER — FENTANYL CITRATE (PF) 100 MCG/2ML IJ SOLN
INTRAMUSCULAR | Status: DC | PRN
  Administered 2023-05-28: 25 ug via INTRAVENOUS

## 2023-05-28 MED ORDER — CEFAZOLIN SODIUM-DEXTROSE 2-4 GM/100ML-% IV SOLN
2.0000 g | INTRAVENOUS | Status: AC
  Administered 2023-05-28: 2 g via INTRAVENOUS
  Filled 2023-05-28: qty 100

## 2023-05-28 MED ORDER — ARTIFICIAL TEARS OPHTHALMIC OINT
TOPICAL_OINTMENT | OPHTHALMIC | Status: AC
Start: 2023-05-28 — End: ?
  Filled 2023-05-28: qty 3.5

## 2023-05-28 MED ORDER — ONDANSETRON HCL 4 MG/2ML IJ SOLN
INTRAMUSCULAR | Status: AC
Start: 2023-05-28 — End: ?
  Filled 2023-05-28: qty 2

## 2023-05-28 MED ORDER — FENTANYL CITRATE (PF) 100 MCG/2ML IJ SOLN
INTRAMUSCULAR | Status: AC
  Filled 2023-05-28: qty 2

## 2023-05-28 SURGICAL SUPPLY — 18 items
BAG DRAIN URO TABLE W/ADPT NS (BAG) ×1 IMPLANT
BAG HAMPER (MISCELLANEOUS) ×1 IMPLANT
CLOTH BEACON ORANGE TIMEOUT ST (SAFETY) ×1 IMPLANT
GLOVE BIO SURGEON STRL SZ8 (GLOVE) ×1 IMPLANT
GLOVE BIOGEL PI IND STRL 7.0 (GLOVE) ×2 IMPLANT
GOWN STRL REUS W/TWL LRG LVL3 (GOWN DISPOSABLE) ×1 IMPLANT
GOWN STRL REUS W/TWL XL LVL3 (GOWN DISPOSABLE) ×1 IMPLANT
KIT TURNOVER CYSTO (KITS) ×1 IMPLANT
MANIFOLD NEPTUNE II (INSTRUMENTS) ×1 IMPLANT
PACK CYSTO (CUSTOM PROCEDURE TRAY) ×1 IMPLANT
PAD ARMBOARD POSITIONER FOAM (MISCELLANEOUS) ×1 IMPLANT
POSITIONER HEAD 8X9X4 ADT (SOFTGOODS) ×1 IMPLANT
SOL .9 NS 3000ML IRR UROMATIC (IV SOLUTION) IMPLANT
SYSTEM UROLIFT 2 CART W/ HNDL (Male Continence) IMPLANT
SYSTEM UROLIFT 2 CARTRIDGE (Male Continence) IMPLANT
TOWEL OR 17X26 4PK STRL BLUE (TOWEL DISPOSABLE) ×1 IMPLANT
TRAY FOLEY W/BAG SLVR 16FR ST (SET/KITS/TRAYS/PACK) ×1 IMPLANT
WATER STERILE IRR 500ML POUR (IV SOLUTION) ×1 IMPLANT

## 2023-05-28 NOTE — Anesthesia Procedure Notes (Signed)
 Procedure Name: LMA Insertion Date/Time: 05/28/2023 3:43 PM  Performed by: Delphine Fiedler, RNPre-anesthesia Checklist: Patient identified, Emergency Drugs available, Suction available and Patient being monitored Patient Re-evaluated:Patient Re-evaluated prior to induction Oxygen Delivery Method: Circle system utilized Preoxygenation: Pre-oxygenation with 100% oxygen Induction Type: IV induction Ventilation: Mask ventilation without difficulty LMA: LMA inserted LMA Size: 4.0 Number of attempts: 2 Placement Confirmation: positive ETCO2 Tube secured with: Tape Dental Injury: Bloody posterior oropharynx  Comments: LMA attempt x2. First attempt unsuccessful, blood noted in the oropharynx. Suctioned. Pt mask ventilated with gas. Second attempt successful with smooth placement.

## 2023-05-28 NOTE — Op Note (Signed)
   PREOPERATIVE DIAGNOSIS:  Benign prostatic hypertrophy with bladder outlet obstruction.  POSTOPERATIVE DIAGNOSIS:  Benign prostatic hypertrophy with bladder outlet obstruction.  PROCEDURE:  Cystoscopy with implantation of UroLift devices, 6 implants.  SURGEON:  Johnie Nailer, M.D.  ANESTHESIA:  General  ANTIBIOTICS: ancef   SPECIMEN:  None.  DRAINS:  A 16-French Foley catheter.  BLOOD LOSS:  Minimal.  COMPLICATIONS:  None.  INDICATIONS: The Patient is an 78 year old male with BPH and bladder outlet obstruction.  He has failed medical therapy and has elected UroLift for definitive treatment.  FINDINGS OF PROCEDURE:  He was taken to the operating room where a general anesthetic was induced.  He was placed in lithotomy position and was fitted with PAS hose.  His perineum and genitalia were prepped with chlorhexidine , and he was draped in usual sterile fashion.  Cystoscopy was performed using the UroLift scope and 0 degree lens. Examination revealed a normal urethra.  The external sphincter was intact.  Prostatic urethra was approximately 5 cm in length with lateral lobe enlargement. There was also little bit of bladder neck elevation. Inspection of bladder revealed mild-to-moderate trabeculation with no tumors, stones, or inflammation.  No cellules or diverticula were noted. Ureteral orifices were in their normal anatomic position effluxing clear urine.  After initial cystoscopy, the visual obturator was replaced with the first UroLift device.  This was turned to the 9 o'clock position and pulled back to the veru and then slightly advanced.  Pressure was then applied to the right lateral lobe and the UroLift device was deployed.  The second UroLift device was then inserted and applied to the left lateral lobe at 3 o'clock and deployed in the mid prostatic urethra. After this, there was still some apparent obstruction closer to the bladder neck.  So a second and  third level of UroLift your left device was applied between the mid urethra and the proximal urethra providing further patency to the prostatic urethra. The scope was removed and a 16-French Foley catheter was inserted without difficulty. The balloon was filled with 10 mL sterile fluid, and the catheter was placed to straight drainage.  COMPLICATIONS: None   CONDITION: Stable, extubated, transferred to PACU  PLAN: The patient will be discharged home and followup in 2 days for a voiding trial.

## 2023-05-28 NOTE — H&P (Addendum)
 HPI: [name] is a 78yo here for Urolift. He has failed multiple voiding trials. He is currently on rapaflo  8mg  daily     PMH:     Past Medical History:  Diagnosis Date   Back pain     COPD (chronic obstructive pulmonary disease) (HCC)     Diabetes mellitus without complication (HCC)     Dyspnea     Dysrhythmia     Hyperlipidemia     Oxygen dependent      4L when needed          Surgical History:      Past Surgical History:  Procedure Laterality Date   A-FLUTTER ABLATION N/A 08/23/2022    Procedure: A-FLUTTER ABLATION;  Surgeon: Tammie Fall, MD;  Location: [facility] INVASIVE CV LAB;  Service: Cardiovascular;  Laterality: N/A;   BIOPSY   04/26/2022    Procedure: BIOPSY;  Surgeon: Suzette Espy, MD;  Location: [facility];  Service: Endoscopy;;   BLADDER INSTILLATION N/A 11/16/2022    Procedure: BLADDER INSTILLATION- Gemcitibine;  Surgeon: Marco Severs, MD;  Location: [facility] ORS;  Service: Urology;  Laterality: N/A;   COLONOSCOPY WITH PROPOFOL  N/A 04/26/2022    Procedure: COLONOSCOPY WITH PROPOFOL ;  Surgeon: Suzette Espy, MD;  Location: [facility];  Service: Endoscopy;  Laterality: N/A;  10:00am; ASA 3   CYSTOSCOPY N/A 11/16/2022    Procedure: CYSTOSCOPY;  Surgeon: Marco Severs, MD;  Location: [facility] ORS;  Service: Urology;  Laterality: N/A;   hemorhoidectomy       IR IMAGING GUIDED PORT INSERTION   05/12/2022   POLYPECTOMY   04/26/2022    Procedure: POLYPECTOMY;  Surgeon: Suzette Espy, MD;  Location: [facility];  Service: Endoscopy;;   TRANSURETHRAL RESECTION OF BLADDER TUMOR N/A 11/16/2022    Procedure: TRANSURETHRAL RESECTION OF BLADDER TUMOR (TURBT);  Surgeon: Marco Severs, MD;  Location: [facility];  Service: Urology;  Laterality: N/A;          Home Medications:  Allergies as of 04/04/2023   No Known Allergies         Medication List           Accurate as of April 04, 2023  4:09 PM. If you have any questions, ask your nurse or doctor.               albuterol  108 (90 Base) MCG/ACT inhaler Commonly known as: VENTOLIN  HFA Inhale 2 puffs into the lungs every 6 (six) hours as needed for wheezing or shortness of breath.    atorvastatin  20 MG tablet Commonly known as: LIPITOR Take 20 mg by mouth daily.    empagliflozin  25 MG Tabs tablet Commonly known as: JARDIANCE  Take 25 mg by mouth daily.    febuxostat  40 MG tablet Commonly known as: ULORIC  Take 1 tablet (40 mg total) by mouth daily.    fenofibrate 54 MG tablet Take 54 mg by mouth daily.    finasteride  5 MG tablet Commonly known as: PROSCAR  Take 1 tablet (5 mg total) by mouth daily.    furosemide  20 MG tablet Commonly known as: LASIX  Take 1 tablet (20 mg total) by mouth daily as needed.    loperamide  2 MG capsule Commonly known as: IMODIUM  Take 2 mg by mouth 3 (three) times daily as needed for diarrhea or loose stools.    MELATONIN PO Take 1 tablet by mouth at bedtime as needed (sleep).    Misc. Devices Misc Please provide with shower chair  polyethylene glycol 17 g packet Commonly known as: MIRALAX  / GLYCOLAX  Take 34 g by mouth daily.    silodosin  8 MG Caps capsule Commonly known as: RAPAFLO  Take 1 capsule (8 mg total) by mouth daily with breakfast.    traMADol  50 MG tablet Commonly known as: ULTRAM  Take 50 mg by mouth every 6 (six) hours as needed for moderate pain (pain score 4-6).             Allergies:  Allergies  No Known Allergies     Family History: No family history on file.       Social History:  reports that he quit smoking about 6 years ago. His smoking use included cigarettes. He started smoking about 56 years ago. He has never used smokeless tobacco. He reports that he does not drink alcohol and does not use drugs.   ROS: All other review of systems were reviewed and are negative except what is noted above in HPI   Physical Exam: There were no vitals taken for this visit.  Constitutional:  Alert and oriented, No acute  distress. HEENT: [place] AT, moist mucus membranes.  Trachea midline, no masses. Cardiovascular: No clubbing, cyanosis, or edema. Respiratory: Normal respiratory effort, no increased work of breathing. GI: Abdomen is soft, nontender, nondistended, no abdominal masses GU: No CVA tenderness.  Lymph: No cervical or inguinal lymphadenopathy. Skin: No rashes, bruises or suspicious lesions. Neurologic: Grossly intact, no focal deficits, moving all 4 extremities. Psychiatric: Normal mood and affect.   Laboratory Data: Recent Labs       Lab Results  Component Value Date    WBC 6.6 03/06/2023    HGB 9.8 (L) 03/06/2023    HCT 32.2 (L) 03/06/2023    MCV 97.9 03/06/2023    PLT 312 03/06/2023        Recent Labs       Lab Results  Component Value Date    CREATININE 1.20 03/06/2023        Recent Labs       Lab Results  Component Value Date    PSA 3.1 10/03/2018    PSA 1.7 10/03/2017        Recent Labs  No results found for: "TESTOSTERONE"     Recent Labs       Lab Results  Component Value Date    HGBA1C 7.8 (H) 07/08/2022        Urinalysis Labs (Brief)          Component Value Date/Time    COLORURINE YELLOW 02/19/2023 1701    APPEARANCEUR CLOUDY (A) 02/19/2023 1701    APPEARANCEUR Cloudy (A) 10/18/2022 1513    LABSPEC 1.009 02/19/2023 1701    PHURINE 7.0 02/19/2023 1701    GLUCOSEU 150 (A) 02/19/2023 1701    HGBUR LARGE (A) 02/19/2023 1701    BILIRUBINUR NEGATIVE 02/19/2023 1701    BILIRUBINUR Negative 10/18/2022 1513    KETONESUR NEGATIVE 02/19/2023 1701    PROTEINUR 100 (A) 02/19/2023 1701    NITRITE NEGATIVE 02/19/2023 1701    LEUKOCYTESUR LARGE (A) 02/19/2023 1701        Recent Labs       Lab Results  Component Value Date    LABMICR See below: 10/18/2022    WBCUA 11-30 (A) 10/18/2022    LABEPIT 0-10 10/18/2022    BACTERIA RARE (A) 02/19/2023        Pertinent Imaging:   No results found for this or any previous visit.   Results for orders placed  during the hospital encounter of 10/07/18   US  Venous Img Lower Bilateral   Narrative CLINICAL DATA:  Bilateral lower extremity swelling.   EXAM: BILATERAL LOWER EXTREMITY VENOUS DOPPLER ULTRASOUND   TECHNIQUE: Gray-scale sonography with graded compression, as well as color Doppler and duplex ultrasound were performed to evaluate the lower extremity deep venous systems from the level of the common femoral vein and including the common femoral, femoral, profunda femoral, popliteal and calf veins including the posterior tibial, peroneal and gastrocnemius veins when visible. The superficial great saphenous vein was also interrogated. Spectral Doppler was utilized to evaluate flow at rest and with distal augmentation maneuvers in the common femoral, femoral and popliteal veins.   COMPARISON:  None.   FINDINGS: RIGHT LOWER EXTREMITY   Common Femoral Vein: No evidence of thrombus. Normal compressibility, respiratory phasicity and response to augmentation.   Saphenofemoral Junction: No evidence of thrombus. Normal compressibility and flow on color Doppler imaging.   Profunda Femoral Vein: No evidence of thrombus. Normal compressibility and flow on color Doppler imaging.   Femoral Vein: No evidence of thrombus. Normal compressibility, respiratory phasicity and response to augmentation.   Popliteal Vein: No evidence of thrombus. Normal compressibility, respiratory phasicity and response to augmentation.   Calf Veins: No evidence of thrombus. Normal compressibility and flow on color Doppler imaging.   Superficial Great Saphenous Vein: No evidence of thrombus. Normal compressibility.   Other Findings:  None.   LEFT LOWER EXTREMITY   Common Femoral Vein: No evidence of thrombus. Normal compressibility, respiratory phasicity and response to augmentation.   Saphenofemoral Junction: No evidence of thrombus. Normal compressibility and flow on color Doppler imaging.   Profunda  Femoral Vein: No evidence of thrombus. Normal compressibility and flow on color Doppler imaging.   Femoral Vein: No evidence of thrombus. Normal compressibility, respiratory phasicity and response to augmentation.   Popliteal Vein: No evidence of thrombus. Normal compressibility, respiratory phasicity and response to augmentation.   Calf Veins: No evidence of thrombus. Normal compressibility and flow on color Doppler imaging.   Other Findings: None.   IMPRESSION: No evidence of deep venous thrombosis in either lower extremity.     Electronically Signed By: Andy Bannister  Register On: 10/07/2018 14:00   No results found for this or any previous visit.   No results found for this or any previous visit.   Results for orders placed during the hospital encounter of 02/19/23   US  RENAL   Narrative CLINICAL DATA:  Acute kidney injury.   EXAM: RENAL / URINARY TRACT ULTRASOUND COMPLETE   COMPARISON:  CT scan 05/09/2022.   FINDINGS: Right Kidney:   Renal measurements: 12.4 x 6.0 x 5.9 cm = volume: 229 mL. Moderate right renal collecting system dilatation. No perinephric fluid. Preserved parenchyma.   Left Kidney:   Renal measurements: 12.6 x 6.2 x 5.2 cm = volume: 214.5 mL. Moderate collecting system dilatation. No perinephric fluid. Preserved parenchyma.   Bladder:   Distended bladder with significant wall thickening measuring up to 11 mm. Prevoid volume of 294 cc.   Other:   Evaluation somewhat limited by patient has been difficulty with procedure as per the sonographer.   IMPRESSION: Distended urinary bladder with a significant wall thickening. There is also moderate bilateral renal collecting system dilatation. Would recommend repeat study after decompression of the bladder to see if this persists or additional cross-sectional imaging workup with a CT when clinically appropriate     Electronically Signed By: Adrianna Horde M.D. On: 02/20/2023 11:04  No  results found for this or any previous visit.   Results for orders placed during the hospital encounter of 05/09/22   CT HEMATURIA WORKUP   Narrative CLINICAL DATA:  Hematuria. Bladder mass on recent MRI. Rectal carcinoma. * Tracking Code: BO *   EXAM: CT ABDOMEN AND PELVIS WITHOUT AND WITH CONTRAST   TECHNIQUE: Multidetector CT imaging of the abdomen and pelvis was performed following the standard protocol before and following the bolus administration of intravenous contrast.   RADIATION DOSE REDUCTION: This exam was performed according to the departmental dose-optimization program which includes automated exposure control, adjustment of the mA and/or kV according to patient size and/or use of iterative reconstruction technique.   CONTRAST:  OMNIPAQUE  IOHEXOL  300 MG/ML  SOLN   COMPARISON:  05/01/2022   FINDINGS: Lower Chest: No acute findings.   Hepatobiliary: No hepatic masses identified. Gallbladder is unremarkable. No evidence of biliary ductal dilatation.   Pancreas:  No mass or inflammatory changes.   Spleen: Within normal limits in size and appearance.   Adrenals/Urinary Tract: No adrenal masses identified. Few tiny 1-2 mm calculi are noted in both kidneys. No evidence of ureteral calculi or hydronephrosis. No suspicious renal masses identified. No masses seen involving the collecting systems or ureters. Mild diffuse bladder wall thickening and trabeculation is seen which may be due to chronic bladder outlet obstruction or cystitis. A focal enhancing soft tissue nodule measuring approximately 1.2 cm is seen along the left lateral bladder wall, suspicious for bladder carcinoma.   Stomach/Bowel: Concentric rectal wall thickening and enhancement is seen, consistent with known primary rectal carcinoma. No evidence of obstruction, inflammatory process or abnormal fluid collections. Normal appendix visualized. Diverticulosis is seen mainly involving the  descending and sigmoid colon, however there is no evidence of diverticulitis.   Vascular/Lymphatic: No pathologically enlarged lymph nodes. No acute vascular findings. Aortic atherosclerotic calcification incidentally noted. 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   Reproductive:  No mass or other significant abnormality.   Other:  None.   Musculoskeletal:  No suspicious bone lesions identified.   IMPRESSION: 1.2 cm focal enhancing soft tissue nodule along the left lateral bladder wall, suspicious for bladder carcinoma.   Low rectal soft tissue mass, consistent with known primary rectal carcinoma.   No evidence of metastatic disease within the abdomen or pelvis.   Mild diffuse bladder wall thickening and trabeculation, which may be due to chronic bladder outlet obstruction or cystitis.   Tiny nonobstructing bilateral renal calculi.   Colonic diverticulosis, without radiographic evidence of diverticulitis.   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   Aortic Atherosclerosis (ICD10-I70.0).     Electronically Signed By: Marlyce Sine M.D. On: 05/09/2022 10:58   No results found for this or any previous visit.     Assessment & Plan:     1. Urinary retention (Primary) We discussed the management of his BPH including continued medical therapy, Rezum, Urolift, TURP and simple prostatectomy. After discussing the options the patient has elected to proceed with Urolift. Risks/benefits/alternatives discussed.

## 2023-05-28 NOTE — Transfer of Care (Cosign Needed)
 Immediate Anesthesia Transfer of Care Note  Patient: James Miles  Procedure(s) Performed: CYSTOSCOPY WITH INSERTION OF UROLIFT (Bladder)  Patient Location: PACU  Anesthesia Type:General  Level of Consciousness: drowsy and patient cooperative  Airway & Oxygen Therapy: Patient Spontanous Breathing and Patient connected to nasal cannula oxygen  Post-op Assessment: Report given to RN and Post -op Vital signs reviewed and stable  Post vital signs: Reviewed and stable  Last Vitals:  Vitals Value Taken Time  BP 117/59 05/28/23 1606  Temp 97.5   Pulse 59 05/28/23 1607  Resp 14 05/28/23 1608  SpO2 100 % 05/28/23 1607  Vitals shown include unfiled device data.  Last Pain:  Vitals:   05/28/23 1406  PainSc: 0-No pain         Complications: No notable events documented.

## 2023-05-28 NOTE — Anesthesia Preprocedure Evaluation (Signed)
 Anesthesia Evaluation  Patient identified by MRN, date of birth, ID band Patient awake    Reviewed: Allergy & Precautions, H&P , NPO status , Patient's Chart, lab work & pertinent test results, reviewed documented beta blocker date and time   Airway Mallampati: II  TM Distance: >3 FB Neck ROM: full    Dental no notable dental hx.    Pulmonary shortness of breath, COPD, former smoker   Pulmonary exam normal breath sounds clear to auscultation       Cardiovascular Exercise Tolerance: Good hypertension, + dysrhythmias  Rhythm:regular Rate:Normal     Neuro/Psych negative neurological ROS  negative psych ROS   GI/Hepatic negative GI ROS, Neg liver ROS,,,  Endo/Other  diabetes    Renal/GU negative Renal ROS  negative genitourinary   Musculoskeletal   Abdominal   Peds  Hematology  (+) Blood dyscrasia, anemia   Anesthesia Other Findings   Reproductive/Obstetrics negative OB ROS                             Anesthesia Physical Anesthesia Plan  ASA: 2  Anesthesia Plan: General and General LMA   Post-op Pain Management:    Induction:   PONV Risk Score and Plan: Ondansetron   Airway Management Planned:   Additional Equipment:   Intra-op Plan:   Post-operative Plan:   Informed Consent: I have reviewed the patients History and Physical, chart, labs and discussed the procedure including the risks, benefits and alternatives for the proposed anesthesia with the patient or authorized representative who has indicated his/her understanding and acceptance.     Dental Advisory Given  Plan Discussed with: CRNA  Anesthesia Plan Comments:        Anesthesia Quick Evaluation

## 2023-05-29 ENCOUNTER — Encounter: Payer: Self-pay | Admitting: Hematology

## 2023-05-29 ENCOUNTER — Encounter (HOSPITAL_COMMUNITY): Payer: Self-pay | Admitting: Urology

## 2023-05-29 NOTE — Progress Notes (Unsigned)
 Name: James Miles DOB: Jan 28, 1946 MRN: [id number]  Diagnoses: Post-operative state  HPI: James Miles presents post-operatively.  He is accompanied by his son [name]. GU History includes: 1. BPH with BOO & LUTS (urinary retention). - Taking Proscar  and Rapaflo . 2. Bladder mass.  - 11/16/2022: Underwent TURBT by Dr. Claretta Croft. Pathology: Negative for carcinoma. 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  3. Kidney stone(s). - 02/20/2023: CT showed a non-obstructing 2 mm right kidney stone.  Today He presents s/p the following procedures by Dr. Claretta Croft on 05/28/2023:  PREOPERATIVE DIAGNOSIS:  Benign prostatic hypertrophy with bladder outlet obstruction.   POSTOPERATIVE DIAGNOSIS:  Same   PROCEDURE:  Cystoscopy with implantation of UroLift devices, 6 implants.  Postop course: He reports the catheter is draining well.  He denies gross hematuria.  He denies acute flank pain, abdominal pain, fevers, nausea, or vomiting.   Medications: Current Outpatient Medications  Medication Sig Dispense Refill   albuterol  (VENTOLIN  HFA) 108 (90 Base) MCG/ACT inhaler Inhale 2 puffs into the lungs every 6 (six) hours as needed for wheezing or shortness of breath.     calcium  carbonate (TUMS - DOSED IN MG ELEMENTAL CALCIUM ) 500 MG chewable tablet Chew 1 tablet by mouth daily as needed for indigestion or heartburn.     DM-Phenylephrine -Acetaminophen  (MUCINEX FAST-MAX CONG HEADACHE PO) Take 1-2 tablets by mouth daily as needed (congestion).     febuxostat  (ULORIC ) 40 MG tablet Take 1 tablet (40 mg total) by mouth daily. (Patient taking differently: Take 40 mg by mouth daily as needed (gout flare).) 90 tablet 1   finasteride  (PROSCAR ) 5 MG tablet Take 1 tablet (5 mg total) by mouth daily. 90 tablet 3   furosemide  (LASIX ) 20 MG tablet Take 1 tablet (20 mg total) by mouth daily as needed. 30 tablet 2   HYDROcodone -acetaminophen   (NORCO/VICODIN) 5-325 MG tablet Take 1 tablet by mouth every 6 (six) hours as needed for moderate pain (pain score 4-6). 15 tablet 0   loperamide  (IMODIUM ) 2 MG capsule Take 2 mg by mouth 3 (three) times daily as needed for diarrhea or loose stools.     MELATONIN PO Take 1 tablet by mouth at bedtime as needed (sleep).     Misc. Devices MISC Please provide with shower chair 1 Units 0   naproxen sodium (ALEVE) 220 MG tablet Take 220 mg by mouth daily as needed (pain).     prochlorperazine  (COMPAZINE ) 10 MG tablet Take 10 mg by mouth every 6 (six) hours as needed for nausea or vomiting.     silodosin  (RAPAFLO ) 8 MG CAPS capsule Take 1 capsule (8 mg total) by mouth daily with breakfast. 30 capsule 11   No current facility-administered medications for this visit.    Allergies: No Known Allergies  Past Medical History:  Diagnosis Date   Back pain    COPD (chronic obstructive pulmonary disease) (HCC)    Diabetes mellitus without complication (HCC)    Dyspnea    Dysrhythmia    Hyperlipidemia    Oxygen dependent    4L when needed   Past Surgical History:  Procedure Laterality Date   A-FLUTTER ABLATION N/A 08/23/2022   Procedure: A-FLUTTER ABLATION;  Surgeon: Tammie Fall, MD;  Location: [facility];  Service: Cardiovascular;  Laterality: N/A;   BIOPSY  04/26/2022   Procedure: BIOPSY;  Surgeon: Suzette Espy, MD;  Location: [facility];  Service: Endoscopy;;   BLADDER INSTILLATION N/A 11/16/2022  Procedure: BLADDER INSTILLATION- Gemcitibine;  Surgeon: Marco Severs, MD;  Location: [facility];  Service: Urology;  Laterality: N/A;   COLONOSCOPY WITH PROPOFOL  N/A 04/26/2022   Procedure: COLONOSCOPY WITH PROPOFOL ;  Surgeon: Suzette Espy, MD;  Location: [facility];  Service: Endoscopy;  Laterality: N/A;  10:00am; ASA 3   CYSTOSCOPY N/A 11/16/2022   Procedure: CYSTOSCOPY;  Surgeon: Marco Severs, MD;  Location: [facility];  Service: Urology;  Laterality: N/A;   CYSTOSCOPY  WITH INSERTION OF UROLIFT N/A 05/28/2023   Procedure: CYSTOSCOPY WITH INSERTION OF UROLIFT;  Surgeon: Marco Severs, MD;  Location: [facility];  Service: Urology;  Laterality: N/A;   hemorhoidectomy     IR IMAGING GUIDED PORT INSERTION  05/12/2022   POLYPECTOMY  04/26/2022   Procedure: POLYPECTOMY;  Surgeon: Suzette Espy, MD;  Location: [facility];  Service: Endoscopy;;   TRANSURETHRAL RESECTION OF BLADDER TUMOR N/A 11/16/2022   Procedure: TRANSURETHRAL RESECTION OF BLADDER TUMOR (TURBT);  Surgeon: Marco Severs, MD;  Location: [facility];  Service: Urology;  Laterality: N/A;   History reviewed. No pertinent family history. Social History   Socioeconomic History   Marital status: Widowed    Spouse name: Not on file   Number of children: 2   Years of education: Not on file   Highest education level: Not on file  Occupational History   Not on file  Tobacco Use   Smoking status: Former    Current packs/day: 0.00    Types: Cigarettes    Start date: 08/1966    Quit date: 08/2016    Years since quitting: 6.8   Smokeless tobacco: Never  Vaping Use   Vaping status: Never Used  Substance and Sexual Activity   Alcohol use: Never   Drug use: Never   Sexual activity: Not Currently  Other Topics Concern   Not on file  Social History Narrative   Not on file   Social Drivers of Health   Financial Resource Strain: Not on file  Food Insecurity: No Food Insecurity (02/19/2023)   Hunger Vital Sign    Worried About Running Out of Food in the Last Year: Never true    Ran Out of Food in the Last Year: Never true  Transportation Needs: No Transportation Needs (02/19/2023)   PRAPARE - Administrator, Civil Service (Medical): No    Lack of Transportation (Non-Medical): No  Physical Activity: Not on file  Stress: Not on file  Social Connections: Unknown (02/19/2023)   Social Connection and Isolation Panel [NHANES]    Frequency of Communication with Friends and Family:  Patient unable to answer    Frequency of Social Gatherings with Friends and Family: Patient unable to answer    Attends Religious Services: Patient unable to answer    Active Member of Clubs or Organizations: Patient unable to answer    Attends Banker Meetings: Patient unable to answer    Marital Status: Widowed  Intimate Partner Violence: Not At Risk (02/19/2023)   Humiliation, Afraid, Rape, and Kick questionnaire    Fear of Current or Ex-Partner: No    Emotionally Abused: No    Physically Abused: No    Sexually Abused: No    SUBJECTIVE  Review of Systems Constitutional: Patient denies any unintentional weight loss or change in strength lntegumentary: Patient denies any rashes or pruritus Cardiovascular: Patient denies chest pain or syncope Respiratory: Patient denies shortness of breath Gastrointestinal: Patient denies nausea, vomiting, constipation Musculoskeletal: Patient denies  muscle cramps or weakness Neurologic: Patient denies convulsions or seizures Allergic/Immunologic: Patient denies recent allergic reaction(s) Hematologic/Lymphatic: Patient denies bleeding tendencies Endocrine: Patient denies heat/cold intolerance  GU: As per HPI.  OBJECTIVE Vitals:   05/30/23 0830  BP: 105/67  Pulse: 71   There is no height or weight on file to calculate BMI.  Physical Examination Constitutional: No obvious distress; patient is non-toxic appearing  Cardiovascular: No visible lower extremity edema.  Respiratory: The patient does not have audible wheezing/stridor; respirations do not appear labored  Gastrointestinal: Abdomen non-distended Musculoskeletal: Normal ROM of UEs  Skin: No obvious rashes/open sores  Neurologic: CN 2-12 grossly intact Psychiatric: Answered questions appropriately with normal affect  Hematologic/Lymphatic/Immunologic: No obvious bruises or sites of spontaneous bleeding  GU: Catheter draining clear yellow urine.  ASSESSMENT Benign  prostatic hyperplasia with urinary obstruction - Plan: Bladder Voiding Trial, DISCONTINUED: ciprofloxacin  (CIPRO ) tablet 500 mg  Urinary retention - Plan: Bladder Voiding Trial, DISCONTINUED: ciprofloxacin  (CIPRO ) tablet 500 mg  Postop check - Plan: Bladder Voiding Trial, DISCONTINUED: ciprofloxacin  (CIPRO ) tablet 500 mg  We reviewed the operative procedures and findings.  Failed voiding trial; will repeat in approximately 1 week. Patient and son verbalized understanding of and agreement with current plan. All questions were answered.  PLAN Advised the following: Foley catheter continued. Continue Rapaflo  (Silodosin ). Continue Proscar  (Finasteride ). Return in about 6 days (around 06/05/2023) for nurse visit for repeat postop voiding trial.   Orders Placed This Encounter  Procedures   Bladder Voiding Trial    It has been explained that the patient is to follow regularly with their PCP in addition to all other providers involved in their care and to follow instructions provided by these respective offices. Patient advised to contact urology clinic if any urologic-pertaining questions, concerns, new symptoms or problems arise in the interim period.  There are no Patient Instructions on file for this visit.  Electronically signed by:  Lauretta Ponto, MSN, FNP-C, CUNP 05/30/2023 9:16 AM

## 2023-05-30 ENCOUNTER — Ambulatory Visit: Admitting: Urology

## 2023-05-30 ENCOUNTER — Encounter: Payer: Self-pay | Admitting: Urology

## 2023-05-30 VITALS — BP 105/67 | HR 71

## 2023-05-30 DIAGNOSIS — Z09 Encounter for follow-up examination after completed treatment for conditions other than malignant neoplasm: Secondary | ICD-10-CM

## 2023-05-30 DIAGNOSIS — N401 Enlarged prostate with lower urinary tract symptoms: Secondary | ICD-10-CM

## 2023-05-30 DIAGNOSIS — N138 Other obstructive and reflux uropathy: Secondary | ICD-10-CM

## 2023-05-30 DIAGNOSIS — R339 Retention of urine, unspecified: Secondary | ICD-10-CM

## 2023-05-30 DIAGNOSIS — R338 Other retention of urine: Secondary | ICD-10-CM | POA: Diagnosis not present

## 2023-05-30 MED ORDER — CIPROFLOXACIN HCL 500 MG PO TABS: 500.0000 mg | ORAL_TABLET | Freq: Once | ORAL | Status: DC

## 2023-05-30 NOTE — Addendum Note (Signed)
 Addended by: Buddie Carina R on: 05/30/2023 05:09 PM   Modules accepted: Orders

## 2023-05-30 NOTE — Progress Notes (Signed)
 Patient came in for voiding trial and was unable to complete voiding trail due to multiple bladder spasm and not on medication to help with voiding trial. Per Sarah patient needed to come back next week for repeat voiding trial

## 2023-05-30 NOTE — Anesthesia Postprocedure Evaluation (Signed)
 Anesthesia Post Note  Patient: James Miles  Procedure(s) Performed: CYSTOSCOPY WITH INSERTION OF UROLIFT (Bladder)  Patient location during evaluation: Phase II Anesthesia Type: General Level of consciousness: awake Pain management: pain level controlled Vital Signs Assessment: post-procedure vital signs reviewed and stable Respiratory status: spontaneous breathing and respiratory function stable Cardiovascular status: blood pressure returned to baseline and stable Postop Assessment: no headache and no apparent nausea or vomiting Anesthetic complications: no Comments: Late entry   No notable events documented.   Last Vitals:  Vitals:   05/28/23 1635 05/28/23 1637  BP: (!) 140/71   Pulse: 64   Resp: 18   Temp:  (!) 36.3 C  SpO2: 97%     Last Pain:  Vitals:   05/29/23 1335  TempSrc:   PainSc: 0-No pain                 Coretha Dew

## 2023-06-08 ENCOUNTER — Telehealth: Payer: Self-pay

## 2023-06-08 ENCOUNTER — Ambulatory Visit (INDEPENDENT_AMBULATORY_CARE_PROVIDER_SITE_OTHER)

## 2023-06-08 DIAGNOSIS — Z09 Encounter for follow-up examination after completed treatment for conditions other than malignant neoplasm: Secondary | ICD-10-CM

## 2023-06-08 DIAGNOSIS — R339 Retention of urine, unspecified: Secondary | ICD-10-CM | POA: Diagnosis not present

## 2023-06-08 MED ORDER — CIPROFLOXACIN HCL 500 MG PO TABS
500.0000 mg | ORAL_TABLET | Freq: Once | ORAL | Status: AC
  Administered 2023-06-08: 500 mg via ORAL

## 2023-06-08 NOTE — Telephone Encounter (Signed)
 Pt seen today for voiding trial was unable to urinate and a catheter was placed back in (see previous clinical support notes for details) pt states is currently taking rapaflo 

## 2023-06-08 NOTE — Addendum Note (Signed)
 Addended by: Dyke Glasser on: 06/08/2023 01:37 PM   Modules accepted: Orders

## 2023-06-08 NOTE — Progress Notes (Signed)
 Simple Catheter Placement  Due to urinary retention patient is present today for a foley cath placement.  Patient was cleaned and prepped in a sterile fashion with Betadinex3 . A 16 FR foley catheter was inserted, urine return was noted  , urine was Clear yellow and Dark yellow in color.  The balloon was filled with 10cc of sterile water .  A bed bag was attached for drainage. Patient was also given a leg bag to take home and was given instruction on how to change from one bag to another.  Patient was given instruction on proper catheter care.  Patient tolerated well, no complications were noted   Performed by: Melvenia Stabs. CMA  Additional notes/ Follow up:  As scheduled

## 2023-06-08 NOTE — Progress Notes (Cosign Needed Addendum)
 Fill and Pull Catheter Removal  Patient is present today for a catheter removal.  of sterile water  was instilled into the bladder when the patient felt the urge to urinate. 10ml of water  was then drained from the balloon.  A 16FR foley cath was removed from the bladder no complications were noted .  Foley catheter intact and time of removal. Patient as then given some time to void on their own.  Patient can void  25ml on their own after some time.  Patient tolerated well.  One oral prophylactic antibiotic given per MD orders  Performed by: Gillian Lacrosse T. CMA  Follow up/ Additional notes: Pt made aware to return around 12:50PM for PVR pt voiced understanding

## 2023-06-11 ENCOUNTER — Inpatient Hospital Stay: Payer: PPO | Attending: Hematology

## 2023-06-11 ENCOUNTER — Ambulatory Visit (HOSPITAL_COMMUNITY)
Admission: RE | Admit: 2023-06-11 | Discharge: 2023-06-11 | Disposition: A | Discharge status: Home / Self Care | Payer: PPO | Source: Ambulatory Visit | Attending: Hematology | Admitting: Hematology

## 2023-06-11 VITALS — BP 95/54 | HR 77 | Temp 97.4°F | Resp 18

## 2023-06-11 DIAGNOSIS — C20 Malignant neoplasm of rectum: Secondary | ICD-10-CM | POA: Insufficient documentation

## 2023-06-11 DIAGNOSIS — D4122 Neoplasm of uncertain behavior of left ureter: Secondary | ICD-10-CM | POA: Diagnosis not present

## 2023-06-11 DIAGNOSIS — Z9221 Personal history of antineoplastic chemotherapy: Secondary | ICD-10-CM | POA: Diagnosis not present

## 2023-06-11 DIAGNOSIS — I4892 Unspecified atrial flutter: Secondary | ICD-10-CM | POA: Diagnosis not present

## 2023-06-11 DIAGNOSIS — Z923 Personal history of irradiation: Secondary | ICD-10-CM | POA: Insufficient documentation

## 2023-06-11 DIAGNOSIS — N3289 Other specified disorders of bladder: Secondary | ICD-10-CM | POA: Diagnosis not present

## 2023-06-11 DIAGNOSIS — Z7901 Long term (current) use of anticoagulants: Secondary | ICD-10-CM | POA: Insufficient documentation

## 2023-06-11 DIAGNOSIS — Z87891 Personal history of nicotine dependence: Secondary | ICD-10-CM | POA: Insufficient documentation

## 2023-06-11 DIAGNOSIS — K4020 Bilateral inguinal hernia, without obstruction or gangrene, not specified as recurrent: Secondary | ICD-10-CM | POA: Diagnosis not present

## 2023-06-11 DIAGNOSIS — K5730 Diverticulosis of large intestine without perforation or abscess without bleeding: Secondary | ICD-10-CM | POA: Diagnosis not present

## 2023-06-11 LAB — COMPREHENSIVE METABOLIC PANEL WITH GFR
ALT: 13 U/L (ref 0–44)
AST: 13 U/L — ABNORMAL LOW (ref 15–41)
Albumin: 3.4 g/dL — ABNORMAL LOW (ref 3.5–5.0)
Alkaline Phosphatase: 78 U/L (ref 38–126)
Anion gap: 12 (ref 5–15)
BUN: 19 mg/dL (ref 8–23)
CO2: 25 mmol/L (ref 22–32)
Calcium: 9.4 mg/dL (ref 8.9–10.3)
Chloride: 98 mmol/L (ref 98–111)
Creatinine, Ser: 0.92 mg/dL (ref 0.61–1.24)
GFR, Estimated: >60 mL/min (ref >60)
Glucose, Bld: 111 mg/dL — ABNORMAL HIGH (ref 70–99)
Potassium: 4 mmol/L (ref 3.5–5.1)
Sodium: 135 mmol/L (ref 135–145)
Total Bilirubin: 0.7 mg/dL (ref 0.0–1.2)
Total Protein: 7.4 g/dL (ref 6.5–8.1)

## 2023-06-11 LAB — CBC WITH DIFFERENTIAL/PLATELET
Abs Immature Granulocytes: 0.04 10*3/uL (ref 0.00–0.07)
Basophils Absolute: 0 10*3/uL (ref 0.0–0.1)
Basophils Relative: 0 %
Eosinophils Absolute: 0.3 10*3/uL (ref 0.0–0.5)
Eosinophils Relative: 3 %
HCT: 39.8 % (ref 39.0–52.0)
Hemoglobin: 12.3 g/dL — ABNORMAL LOW (ref 13.0–17.0)
Immature Granulocytes: 0 %
Lymphocytes Relative: 15 %
Lymphs Abs: 1.5 10*3/uL (ref 0.7–4.0)
MCH: 28 pg (ref 26.0–34.0)
MCHC: 30.9 g/dL (ref 30.0–36.0)
MCV: 90.5 fL (ref 80.0–100.0)
Monocytes Absolute: 0.7 10*3/uL (ref 0.1–1.0)
Monocytes Relative: 7 %
Neutro Abs: 7.7 10*3/uL (ref 1.7–7.7)
Neutrophils Relative %: 75 %
Platelets: 278 10*3/uL (ref 150–400)
RBC: 4.4 MIL/uL (ref 4.22–5.81)
RDW: 13.4 % (ref 11.5–15.5)
WBC: 10.3 10*3/uL (ref 4.0–10.5)
nRBC: 0 % (ref 0.0–0.2)

## 2023-06-11 LAB — MAGNESIUM: Magnesium: 1.8 mg/dL (ref 1.7–2.4)

## 2023-06-11 MED ORDER — SODIUM CHLORIDE 0.9% FLUSH
10.0000 mL | Freq: Once | INTRAVENOUS | Status: AC
  Administered 2023-06-11: 10 mL via INTRAVENOUS

## 2023-06-11 MED ORDER — HEPARIN SOD (PORK) LOCK FLUSH 100 UNIT/ML IV SOLN
500.0000 [IU] | Freq: Once | INTRAVENOUS | Status: AC
Start: 2023-06-11 — End: 2023-06-11
  Administered 2023-06-11: 500 [IU] via INTRAVENOUS

## 2023-06-11 NOTE — Progress Notes (Signed)
 Patients port flushed without difficulty.  Good blood return noted with no bruising or swelling noted at site.  Band aid applied.  VSS with discharge and left in satisfactory condition with no s/s of distress noted.

## 2023-06-11 NOTE — Patient Instructions (Signed)

## 2023-06-20 ENCOUNTER — Telehealth: Payer: Self-pay | Admitting: *Deleted

## 2023-06-20 ENCOUNTER — Inpatient Hospital Stay: Payer: PPO | Admitting: Hematology

## 2023-06-20 VITALS — BP 122/74 | HR 70 | Temp 97.9°F | Resp 18

## 2023-06-20 DIAGNOSIS — D4122 Neoplasm of uncertain behavior of left ureter: Secondary | ICD-10-CM | POA: Diagnosis not present

## 2023-06-20 DIAGNOSIS — Z87891 Personal history of nicotine dependence: Secondary | ICD-10-CM | POA: Diagnosis not present

## 2023-06-20 DIAGNOSIS — Z9221 Personal history of antineoplastic chemotherapy: Secondary | ICD-10-CM | POA: Diagnosis not present

## 2023-06-20 DIAGNOSIS — C20 Malignant neoplasm of rectum: Secondary | ICD-10-CM | POA: Diagnosis not present

## 2023-06-20 DIAGNOSIS — Z7901 Long term (current) use of anticoagulants: Secondary | ICD-10-CM | POA: Diagnosis not present

## 2023-06-20 DIAGNOSIS — I4892 Unspecified atrial flutter: Secondary | ICD-10-CM | POA: Diagnosis not present

## 2023-06-20 DIAGNOSIS — Z923 Personal history of irradiation: Secondary | ICD-10-CM | POA: Diagnosis not present

## 2023-06-20 NOTE — Telephone Encounter (Signed)
 Rourk, [name], MD  Paulett Boros, MD; Feliz Hosteller, CMA  Jordany Russett, can you arrange. He can be an ASA#3 in room 1.  Will need a tcs prep for FS

## 2023-06-20 NOTE — Progress Notes (Signed)
 [facility] [address], [place] [address]    Clinic Day:  06/20/2023  Referring physician: [name], [place] Ce*  Patient Care Team: [name], Golda Latch, NP as PCP - General Loyde Rule, MD as PCP - Cardiology (Cardiology) Tammie Fall, MD as PCP - Electrophysiology (Cardiology) Gerhard Knuckles, RN as Oncology Nurse Navigator (Medical Oncology) Paulett Boros, MD as Medical Oncologist (Medical Oncology)   ASSESSMENT & PLAN:   Assessment: 1.  Stage II (T3 N0 M0) low rectal adenocarcinoma, MMR preserved: - 45-month history of intermittent rectal bleeding.  No weight loss. - Colonoscopy (04/26/2022): Large cauliflower mass palpated in the distal rectum on DRE.  Scattered diverticula in the descending colon.  5 mm polyp in the ascending colon.  In the rectum, exophytic semilunar neoplastic appearing process beginning at the anal verge and extending proximally about 6 cm. - Pathology (04/26/2022): Invasive moderately differentiated adenocarcinoma of the rectal mass.  MMR preserved. - CT CAP (05/01/2022): Nodular wall thickening along the rectum.  No intrinsic abnormal lymph nodes.  No liver lesion.  Fatty liver.  Nodule along the left side of the urinary bladder.  Small mass along the course of the distal left ureter.  Worrisome for multifocal TCC.  Small lung nodules measuring up to 4 mm. - MRI pelvis (05/03/2022): 6.4 cm left lateral low rectal tumor abutting the internal anal sphincter, T3c N0.  Distance from tumor to the internal anal sphincter is 0 cm.  Tumor measures 6.4 cm in length and up to 2.2 cm in thickness. - PET scan (05/11/2022): No evidence of metastatic disease. - Met with Dr. Andy Bannister on 05/16/2022: She is in agreement with TNT and has discussed APR with colostomy. - TNT: 8 cycles of FOLFOX from 06/07/2022 through 09/25/2022. - Chemoradiation therapy with Xeloda  1500 mg twice daily started on 12/28/2022 completed on 02/14/2023   2.   Social/family history: -He lives at home and is independent of ADLs and IADLs.  He is accompanied by his son today.  Worked at [employer] prior to retirement.  Also served in [place] but denied any exposure to agent orange.  Quit smoking 1 year ago.  Smoked 1 to 2 packs/day for the last 61 years.  Started at age 42. - No family history of malignancies.    Plan: 1.  Stage II (T3 cN0 M0) low rectal adenocarcinoma, MMR preserved: - Reimaging CT scan after completion of chemoradiation therapy on 03/06/2023 of the CT of the chest and abdomen did not show any evidence of metastatic disease. - We reviewed MRI pelvis from 06/11/2023: No true recurrent rectal mass or developing adenopathy. - Labs from 06/11/2023: Normal LFTs.  CBC was normal. - He has not seen Dr. Andy Bannister yet as he is dealing with his Foley catheter.  He is following up with Dr. Claretta Croft. - I have recommended that he have a sigmoidoscopy and random biopsies of the rectal tumor area to make sure he is in remission.  We will make a referral to Dr. Riley Cheadle.  I will also reach out to Dr. Riley Cheadle with in basket message. - I will schedule him for a CT CAP in 6 weeks with CEA level. - We discussed active surveillance option which he wants to pursue.  He will need close follow-ups with repeat scans and sigmoidoscopy.  Discussed with both patient and his son James Miles.   2.  Left urinary bladder mass and distal left ureteral mass: - His last TURBT by Dr. Claretta Croft  was in October 2024 which was negative for cancer.  He still has Foley catheter and has follow-up with Dr. Claretta Croft.     Orders Placed This Encounter  Procedures   CT CHEST ABDOMEN PELVIS W CONTRAST    Standing Status:   Future    Expected Date:   07/21/2023    Expiration Date:   06/19/2024    If indicated for the ordered procedure, I authorize the administration of contrast media per Radiology protocol:   Yes    Does the patient have a contrast media/X-ray dye allergy?:   No     Preferred imaging location?:   [facility]    If indicated for the ordered procedure, I authorize the administration of oral contrast media per Radiology protocol:   Yes      I,Helena R Teague,acting as a scribe for Paulett Boros, MD.,have documented all relevant documentation on the behalf of Paulett Boros, MD,as directed by  Paulett Boros, MD while in the presence of Paulett Boros, MD.  I, Paulett Boros MD, have reviewed the above documentation for accuracy and completeness, and I agree with the above.      Paulett Boros, MD   5/14/20252:45 PM  CHIEF COMPLAINT:   Diagnosis: Rectal adenocarcinoma    Cancer Staging  Rectal adenocarcinoma [facility]) Staging form: Colon and Rectum, AJCC 8th Edition - Clinical stage from 05/07/2022: Stage IIA (cT3, cN0, cM0) - Unsigned    Prior Therapy: FOLFOX, 8 cycles, 06/07/22 - 09/25/22  Current Therapy:  Concurrent chemoradiation with Xeloda , to be followed by surgery    HISTORY OF PRESENT ILLNESS:   Oncology History  Rectal adenocarcinoma (HCC)  05/07/2022 Initial Diagnosis   Rectal adenocarcinoma (HCC)   06/07/2022 -  Chemotherapy   Patient is on Treatment Plan : COLORECTAL FOLFOX q14d x 4 months        INTERVAL HISTORY:   James Miles is a 78 y.o. male presenting to clinic today for follow up of Rectal adenocarcinoma. He was last seen by me on 03/21/23.  Since his last visit, he underwent MRI pelvis with rectal staging on 06/11/23 that found: There is slight asymmetric thickening of the wall of the low rectum left anterolateral, unchanged from previous when adjusted for level of distention. No true recurrent rectal mass at this time. No areas of restricted diffusion. No developing nodal enlargement. Foley catheter in the contracted urinary bladder. Bladder wall is thickened and trabeculated. Global stranding in the pelvis.  James Miles had a cystoscopy on 05/28/23 with Dr. Claretta Croft.   Today, he states that he  is doing well overall. His appetite level is at 100%. His energy level is at 60%.   PAST MEDICAL HISTORY:   Past Medical History: Past Medical History:  Diagnosis Date   Back pain    COPD (chronic obstructive pulmonary disease) (HCC)    Diabetes mellitus without complication (HCC)    Dyspnea    Dysrhythmia    Hyperlipidemia    Oxygen dependent    4L when needed    Surgical History: Past Surgical History:  Procedure Laterality Date   A-FLUTTER ABLATION N/A 08/23/2022   Procedure: A-FLUTTER ABLATION;  Surgeon: Tammie Fall, MD;  Location: [facility] INVASIVE CV LAB;  Service: Cardiovascular;  Laterality: N/A;   BIOPSY  04/26/2022   Procedure: BIOPSY;  Surgeon: Suzette Espy, MD;  Location: [facility] ENDO SUITE;  Service: Endoscopy;;   BLADDER INSTILLATION N/A 11/16/2022   Procedure: BLADDER INSTILLATION- Gemcitibine;  Surgeon: Marco Severs, MD;  Location: [facility]  ORS;  Service: Urology;  Laterality: N/A;   COLONOSCOPY WITH PROPOFOL  N/A 04/26/2022   Procedure: COLONOSCOPY WITH PROPOFOL ;  Surgeon: Suzette Espy, MD;  Location: [facility] ENDO SUITE;  Service: Endoscopy;  Laterality: N/A;  10:00am; ASA 3   CYSTOSCOPY N/A 11/16/2022   Procedure: CYSTOSCOPY;  Surgeon: Marco Severs, MD;  Location: [facility] ORS;  Service: Urology;  Laterality: N/A;   CYSTOSCOPY WITH INSERTION OF UROLIFT N/A 05/28/2023   Procedure: CYSTOSCOPY WITH INSERTION OF UROLIFT;  Surgeon: Marco Severs, MD;  Location: [facility] ORS;  Service: Urology;  Laterality: N/A;   hemorhoidectomy     IR IMAGING GUIDED PORT INSERTION  05/12/2022   POLYPECTOMY  04/26/2022   Procedure: POLYPECTOMY;  Surgeon: Suzette Espy, MD;  Location: [facility] END[facility];  Service: Endoscopy;;   TRANSURETHRAL RESECTION OF BLADDER TUMOR N/A 11/16/2022   Procedure: TRANSURETHRAL RESECTION OF BLADDER TUMOR (TURBT);  Surgeon: Marco Severs, MD;  Location: [facility] ORS;  Service: Urology;  Laterality: N/A;    Social History: Social History   Socioeconomic History    Marital status: Widowed    Spouse name: Not on file   Number of children: 2   Years of education: Not on file   Highest education level: Not on file  Occupational History   Not on file  Tobacco Use   Smoking status: Former    Current packs/day: 0.00    Types: Cigarettes    Start date: 08/1966    Quit date: 08/2016    Years since quitting: 6.8   Smokeless tobacco: Never  Vaping Use   Vaping status: Never Used  Substance and Sexual Activity   Alcohol use: Never   Drug use: Never   Sexual activity: Not Currently  Other Topics Concern   Not on file  Social History Narrative   Not on file   Social Drivers of Health   Financial Resource Strain: Not on file  Food Insecurity: No Food Insecurity (02/19/2023)   Hunger Vital Sign    Worried About Running Out of Food in the Last Year: Never true    Ran Out of Food in the Last Year: Never true  Transportation Needs: No Transportation Needs (02/19/2023)   PRAPARE - Administrator, Civil Service (Medical): No    Lack of Transportation (Non-Medical): No  Physical Activity: Not on file  Stress: Not on file  Social Connections: Unknown (02/19/2023)   Social Connection and Isolation Panel [NHANES]    Frequency of Communication with Friends and Family: Patient unable to answer    Frequency of Social Gatherings with Friends and Family: Patient unable to answer    Attends Religious Services: Patient unable to answer    Active Member of Clubs or Organizations: Patient unable to answer    Attends Banker Meetings: Patient unable to answer    Marital Status: Widowed  Intimate Partner Violence: Not At Risk (02/19/2023)   Humiliation, Afraid, Rape, and Kick questionnaire    Fear of Current or Ex-Partner: No    Emotionally Abused: No    Physically Abused: No    Sexually Abused: No    Family History: No family history on file.  Current Medications:  Current Outpatient Medications:    albuterol  (VENTOLIN  HFA) 108  (90 Base) MCG/ACT inhaler, Inhale 2 puffs into the lungs every 6 (six) hours as needed for wheezing or shortness of breath., Disp: , Rfl:    calcium  carbonate (TUMS - DOSED IN MG ELEMENTAL CALCIUM ) 500 MG chewable  tablet, Chew 1 tablet by mouth daily as needed for indigestion or heartburn., Disp: , Rfl:    DM-Phenylephrine -Acetaminophen  (MUCINEX FAST-MAX CONG HEADACHE PO), Take 1-2 tablets by mouth daily as needed (congestion)., Disp: , Rfl:    febuxostat  (ULORIC ) 40 MG tablet, Take 1 tablet (40 mg total) by mouth daily. (Patient taking differently: Take 40 mg by mouth daily as needed (gout flare).), Disp: 90 tablet, Rfl: 1   finasteride  (PROSCAR ) 5 MG tablet, Take 1 tablet (5 mg total) by mouth daily., Disp: 90 tablet, Rfl: 3   furosemide  (LASIX ) 20 MG tablet, Take 1 tablet (20 mg total) by mouth daily as needed., Disp: 30 tablet, Rfl: 2   HYDROcodone -acetaminophen  (NORCO/VICODIN) 5-325 MG tablet, Take 1 tablet by mouth every 6 (six) hours as needed for moderate pain (pain score 4-6)., Disp: 15 tablet, Rfl: 0   lidocaine -prilocaine  (EMLA ) cream, Apply 1 Application topically., Disp: , Rfl:    loperamide  (IMODIUM ) 2 MG capsule, Take 2 mg by mouth 3 (three) times daily as needed for diarrhea or loose stools., Disp: , Rfl:    MELATONIN PO, Take 1 tablet by mouth at bedtime as needed (sleep)., Disp: , Rfl:    Misc. Devices MISC, Please provide with shower chair, Disp: 1 Units, Rfl: 0   naproxen sodium (ALEVE) 220 MG tablet, Take 220 mg by mouth daily as needed (pain)., Disp: , Rfl:    prochlorperazine  (COMPAZINE ) 10 MG tablet, Take 10 mg by mouth every 6 (six) hours as needed for nausea or vomiting., Disp: , Rfl:    silodosin  (RAPAFLO ) 8 MG CAPS capsule, Take 1 capsule (8 mg total) by mouth daily with breakfast., Disp: 30 capsule, Rfl: 11   Allergies: No Known Allergies  REVIEW OF SYSTEMS:   Review of Systems  Constitutional:  Negative for chills, fatigue and fever.  HENT:   Negative for  lump/mass, mouth sores, nosebleeds, sore throat and trouble swallowing.   Eyes:  Negative for eye problems.  Respiratory:  Positive for cough and shortness of breath.   Cardiovascular:  Negative for chest pain, leg swelling and palpitations.  Gastrointestinal:  Positive for constipation and diarrhea. Negative for abdominal pain, nausea and vomiting.  Genitourinary:  Negative for bladder incontinence, difficulty urinating, dysuria, frequency, hematuria and nocturia.   Musculoskeletal:  Negative for arthralgias, back pain, flank pain, myalgias and neck pain.  Skin:  Negative for itching and rash.  Neurological:  Positive for numbness. Negative for dizziness and headaches.  Hematological:  Does not bruise/bleed easily.  Psychiatric/Behavioral:  Negative for depression, sleep disturbance and suicidal ideas. The patient is not nervous/anxious.   All other systems reviewed and are negative.    VITALS:   Blood pressure 122/74, pulse 70, temperature 97.9 F (36.6 C), temperature source Oral, resp. rate 18, SpO2 92%.  Wt Readings from Last 3 Encounters:  05/28/23 150 lb 6.4 oz (68.2 kg)  05/21/23 150 lb 6.4 oz (68.2 kg)  05/07/23 150 lb 6.4 oz (68.2 kg)    There is no height or weight on file to calculate BMI.  Performance status (ECOG): 1 - Symptomatic but completely ambulatory  PHYSICAL EXAM:   Physical Exam Vitals and nursing note reviewed. Exam conducted with a chaperone present.  Constitutional:      Appearance: Normal appearance.  Cardiovascular:     Rate and Rhythm: Normal rate and regular rhythm.     Pulses: Normal pulses.     Heart sounds: Normal heart sounds.  Pulmonary:     Effort: Pulmonary effort  is normal.     Breath sounds: Normal breath sounds.  Abdominal:     Palpations: Abdomen is soft. There is no hepatomegaly, splenomegaly or mass.     Tenderness: There is no abdominal tenderness.  Musculoskeletal:     Right lower leg: Edema present.     Left lower leg: Edema  present.  Lymphadenopathy:     Cervical: No cervical adenopathy.     Right cervical: No superficial, deep or posterior cervical adenopathy.    Left cervical: No superficial, deep or posterior cervical adenopathy.     Upper Body:     Right upper body: No supraclavicular or axillary adenopathy.     Left upper body: No supraclavicular or axillary adenopathy.  Neurological:     General: No focal deficit present.     Mental Status: He is alert and oriented to person, place, and time.  Psychiatric:        Mood and Affect: Mood normal.        Behavior: Behavior normal.     LABS:      Latest Ref Rng & Units 06/11/2023    2:35 PM 03/06/2023    9:51 AM 02/23/2023    6:29 AM  CBC  WBC 4.0 - 10.5 K/uL 10.3  6.6  8.4   Hemoglobin 13.0 - 17.0 g/dL 57.8  9.8  9.5   Hematocrit 39.0 - 52.0 % 39.8  32.2  31.2   Platelets 150 - 400 K/uL 278  312  385       Latest Ref Rng & Units 06/11/2023    2:35 PM 03/06/2023    9:51 AM 02/23/2023    6:29 AM  CMP  Glucose 70 - 99 mg/dL 469  629  528   BUN 8 - 23 mg/dL 19  22  28    Creatinine 0.61 - 1.24 mg/dL 4.13  2.44  0.10   Sodium 135 - 145 mmol/L 135  139  135   Potassium 3.5 - 5.1 mmol/L 4.0  4.0  5.0   Chloride 98 - 111 mmol/L 98  103  102   CO2 22 - 32 mmol/L 25  29  24    Calcium  8.9 - 10.3 mg/dL 9.4  8.9  8.3   Total Protein 6.5 - 8.1 g/dL 7.4  6.6    Total Bilirubin 0.0 - 1.2 mg/dL 0.7  0.5    Alkaline Phos 38 - 126 U/L 78  60    AST 15 - 41 U/L 13  16    ALT 0 - 44 U/L 13  15       Lab Results  Component Value Date   CEA1 2.6 04/26/2022   /  CEA  Date Value Ref Range Status  04/26/2022 2.6 0.0 - 4.7 ng/mL Final    Comment:    (NOTE)                             Nonsmokers          <3.9                             Smokers             <5.6 Roche [employer] Electrochemiluminescence Immunoassay (ECLIA) Values obtained with different assay methods or kits cannot be used interchangeably.  Results cannot be interpreted as absolute  evidence of the presence or absence of malignant disease. Performed  At: [facility] [address] [place], [place] [id number] Pearlean Botts MD WU:[id number]    No results found for: "PSA1" No results found for: "[phone]" No results found for: "CAN125"  No results found for: "TOTALPROTELP", "ALBUMINELP", "A1GS", "A2GS", "BETS", "BETA2SER", "GAMS", "MSPIKE", "SPEI" No results found for: "TIBC", "FERRITIN", "IRONPCTSAT" No results found for: "LDH"   STUDIES:   MR PELVIS WO CM RECTAL CA STAGING Result Date: 06/15/2023 CLINICAL DATA:  Rectal cancer. EXAM: MRI PELVIS WITHOUT CONTRAST TECHNIQUE: Multiplanar multisequence MR imaging of the pelvis was performed. No intravenous contrast was administered. Patient was unable to tolerate rectal gel at this time. COMPARISON:  MRI 03/12/2023. older examinations as well including CT, PET CT and MRI FINDINGS: TUMOR LOCATION Originally this was a low rectal tumor abutting the anal sphincter. On today's study there is some mild residual thickening of the rectal wall in this location particularly anterior. Some of this could relate to patient having difficulty with the rectal gel and underdistention of the rectal wall. There is some mild asymmetric thickening identified at proximally the 1 o'clock position with the wall is thickened at 8 mm. There is no restricted diffusion in this location however. Thickness of the wall in this location previously measured 7 mm, similar when adjusted for technique. Slight spiculation of the adjacent perirectal fat. Soft tissue mass in the para fat. No thickening of the mesorectal fascia. Minimal thickening along the anal canal. N - CATEGORY No pathologic pelvic lymph nodes identified. Other: Motion. Bilateral fat containing small inguinal hernias. No free fluid in the dependent pelvis. Note is made of some sigmoid colon diverticula. The visualized bowel including the pelvis is nondilated. Mild scattered soft tissue edema of  the pelvis. Foley catheter in place along the bladder which is contracted with wall thickening and trabeculation. Presumed BPH changes along the prostate. Preserved seminal vesicles. IMPRESSION: Evaluation somewhat limited due to underdistention of the rectum as the patient was unable to tolerate the rectal gel. There is slight asymmetric thickening of the wall of the low rectum left anterolateral, unchanged from previous when adjusted for level of distention. No true recurrent rectal mass at this time. No areas of restricted diffusion. No developing nodal enlargement. Foley catheter in the contracted urinary bladder. Bladder wall is thickened and trabeculated. Global stranding in the pelvis. Electronically Signed   By: Adrianna Horde M.D.   On: 06/15/2023 16:56

## 2023-06-20 NOTE — Telephone Encounter (Signed)
 Triage for flex sig  Are you diabetic?  Type 2  Do you have a prosthetic or mechanical heart valve? no  Do you have a pacemaker/defibrillator?   no  Have you had endocarditis/atrial fibrillation?  no  Do you use supplemental oxygen/CPAP?  yes, oxygen  Have you had joint replacement within the last 12 months?  no  Do you tend to be constipated or have to use laxatives?    Do you have history of alcohol use? If yes, how much and how often.  no  Do you have history or are you using drugs? If yes, what do are you  using?  no  Have you ever had a stroke/heart attack?  no  Have you ever had a heart or other vascular stent placed,? no  Do you take weight loss medication? no  Do you take any blood-thinning medications such as: (Plavix, aspirin, Coumadin, Aggrenox, Brilinta, Xarelto , Eliquis , Pradaxa, Savaysa or Effient)? no  If yes we need the name, milligram, dosage and who is prescribing doctor:               Current Outpatient Medications  Medication Sig Dispense Refill   albuterol  (VENTOLIN  HFA) 108 (90 Base) MCG/ACT inhaler Inhale 2 puffs into the lungs every 6 (six) hours as needed for wheezing or shortness of breath.     calcium  carbonate (TUMS - DOSED IN MG ELEMENTAL CALCIUM ) 500 MG chewable tablet Chew 1 tablet by mouth daily as needed for indigestion or heartburn.     DM-Phenylephrine -Acetaminophen  (MUCINEX FAST-MAX CONG HEADACHE PO) Take 1-2 tablets by mouth daily as needed (congestion).     febuxostat  (ULORIC ) 40 MG tablet Take 1 tablet (40 mg total) by mouth daily. (Patient taking differently: Take 40 mg by mouth daily as needed (gout flare).) 90 tablet 1   finasteride  (PROSCAR ) 5 MG tablet Take 1 tablet (5 mg total) by mouth daily. 90 tablet 3   furosemide  (LASIX ) 20 MG tablet Take 1 tablet (20 mg total) by mouth daily as needed. 30 tablet 2   HYDROcodone -acetaminophen  (NORCO/VICODIN) 5-325 MG tablet Take 1 tablet by mouth every 6 (six) hours as needed for moderate pain  (pain score 4-6). 15 tablet 0   lidocaine -prilocaine  (EMLA ) cream Apply 1 Application topically.     loperamide  (IMODIUM ) 2 MG capsule Take 2 mg by mouth 3 (three) times daily as needed for diarrhea or loose stools.     MELATONIN PO Take 1 tablet by mouth at bedtime as needed (sleep).     Misc. Devices MISC Please provide with shower chair 1 Units 0   naproxen sodium (ALEVE) 220 MG tablet Take 220 mg by mouth daily as needed (pain).     prochlorperazine  (COMPAZINE ) 10 MG tablet Take 10 mg by mouth every 6 (six) hours as needed for nausea or vomiting.     silodosin  (RAPAFLO ) 8 MG CAPS capsule Take 1 capsule (8 mg total) by mouth daily with breakfast. 30 capsule 11   No current facility-administered medications for this visit.    No Known Allergies

## 2023-06-20 NOTE — Patient Instructions (Signed)
 [facility] at [facility] Discharge Instructions   You were seen and examined today by Dr. Cheree Cords.  He reviewed the results of your lab work which are normal/stable.   We will see you back in 6 weeks. We will repeat lab work and a CT scan prior to this visit.   Return as scheduled.    Thank you for choosing [facility] at [facility] to provide your oncology and hematology care.  To afford each patient quality time with our provider, please arrive at least 15 minutes before your scheduled appointment time.   If you have a lab appointment with the Cancer Center please come in thru the Main Entrance and check in at the main information desk.  You need to re-schedule your appointment should you arrive 10 or more minutes late.  We strive to give you quality time with our providers, and arriving late affects you and other patients whose appointments are after yours.  Also, if you no show three or more times for appointments you may be dismissed from the clinic at the providers discretion.     Again, thank you for choosing [facility].  Our hope is that these requests will decrease the amount of time that you wait before being seen by our physicians.       _____________________________________________________________  Should you have questions after your visit to [facility], please contact our office at [phone] and follow the prompts.  Our office hours are 8:00 a.m. and 4:30 p.m. Monday - Friday.  Please note that voicemails left after 4:00 p.m. may not be returned until the following business day.  We are closed weekends and major holidays.  You do have access to a nurse 24-7, just call the main number to the clinic [phone] and do not press any options, hold on the line and a nurse will answer the phone.    For prescription refill requests, have your pharmacy contact our office and allow 72 hours.    Due to  Covid, you will need to wear a mask upon entering the hospital. If you do not have a mask, a mask will be given to you at the Main Entrance upon arrival. For doctor visits, patients may have 1 support person age 47 or older with them. For treatment visits, patients can not have anyone with them due to social distancing guidelines and our immunocompromised population.

## 2023-06-21 MED ORDER — PEG 3350-KCL-NA BICARB-NACL 420 G PO SOLR
4000.0000 mL | Freq: Once | ORAL | 0 refills | Status: AC
Start: 2023-06-21 — End: 2023-06-21

## 2023-06-21 NOTE — Addendum Note (Signed)
 Addended by: Feliz Hosteller on: 06/21/2023 08:55 AM   Modules accepted: Orders

## 2023-06-25 NOTE — Telephone Encounter (Signed)
 Contacted pt his on [name]) answered the phone son was advised of pt upcoming appointments pt and son voiced their understanding

## 2023-07-03 ENCOUNTER — Telehealth: Payer: Self-pay | Admitting: Urology

## 2023-07-03 NOTE — Telephone Encounter (Signed)
 Needs pain medication filled at [facility]. He is having pain with urination

## 2023-07-04 NOTE — Telephone Encounter (Signed)
 Called back needing pain medication

## 2023-07-04 NOTE — Telephone Encounter (Signed)
 Verbal from Dr. Claretta Croft he will not refill pain medication at this time, he will need to contact his PCP.  Unable to reach patient by phone, left a vm requesting a call back.

## 2023-07-10 ENCOUNTER — Ambulatory Visit (INDEPENDENT_AMBULATORY_CARE_PROVIDER_SITE_OTHER)

## 2023-07-10 DIAGNOSIS — R338 Other retention of urine: Secondary | ICD-10-CM

## 2023-07-10 DIAGNOSIS — R339 Retention of urine, unspecified: Secondary | ICD-10-CM

## 2023-07-10 DIAGNOSIS — N138 Other obstructive and reflux uropathy: Secondary | ICD-10-CM

## 2023-07-10 NOTE — Progress Notes (Signed)
 Cath Change/ Replacement  Patient is present today for a catheter change due to urinary retention.  10ml of water  was removed from the balloon, a 16FR foley cath was removed without difficulty.  Patient was cleaned and prepped in a sterile fashion with Betadinex3.  A 16 FR foley cath was replaced into the bladder, no complications were noted. Urine return was noted 10 ml and urine was Amber in color. The balloon was filled with 10ml of sterile water . A leg bag was attached for drainage.  A night bag was also given to the patient and patient was given instruction on how to change from one bag to another. Patient was given proper instruction on catheter care.    Performed by: Gorden Latino, CMA  Follow up: keep ov cath change

## 2023-07-13 ENCOUNTER — Telehealth: Payer: Self-pay

## 2023-07-13 NOTE — Telephone Encounter (Signed)
 Patient's son sates patient is having pain from bladder spasm.  Patient's son requesting pain medication from our office, his son was advised to contact PCP for pain medication refills.   Regarding pt bladder spasms do you have any recommendations for patient will you send in a pain rx?

## 2023-07-16 ENCOUNTER — Encounter (HOSPITAL_COMMUNITY): Payer: Self-pay | Admitting: Internal Medicine

## 2023-07-16 ENCOUNTER — Ambulatory Visit (HOSPITAL_COMMUNITY): Admitting: Anesthesiology

## 2023-07-16 ENCOUNTER — Other Ambulatory Visit: Payer: Self-pay

## 2023-07-16 ENCOUNTER — Ambulatory Visit (HOSPITAL_COMMUNITY)
Admission: RE | Admit: 2023-07-16 | Discharge: 2023-07-16 | Disposition: A | Discharge status: Home / Self Care | Attending: Internal Medicine | Admitting: Internal Medicine

## 2023-07-16 ENCOUNTER — Encounter (HOSPITAL_COMMUNITY)
Admission: RE | Disposition: A | Discharge status: Home / Self Care | Payer: Self-pay | Source: Home / Self Care | Attending: Internal Medicine

## 2023-07-16 DIAGNOSIS — Z1211 Encounter for screening for malignant neoplasm of colon: Secondary | ICD-10-CM | POA: Insufficient documentation

## 2023-07-16 DIAGNOSIS — J449 Chronic obstructive pulmonary disease, unspecified: Secondary | ICD-10-CM | POA: Insufficient documentation

## 2023-07-16 DIAGNOSIS — Z85048 Personal history of other malignant neoplasm of rectum, rectosigmoid junction, and anus: Secondary | ICD-10-CM | POA: Insufficient documentation

## 2023-07-16 DIAGNOSIS — I10 Essential (primary) hypertension: Secondary | ICD-10-CM | POA: Diagnosis not present

## 2023-07-16 DIAGNOSIS — Z9221 Personal history of antineoplastic chemotherapy: Secondary | ICD-10-CM | POA: Diagnosis not present

## 2023-07-16 DIAGNOSIS — K623 Rectal prolapse: Secondary | ICD-10-CM

## 2023-07-16 DIAGNOSIS — Z87891 Personal history of nicotine dependence: Secondary | ICD-10-CM | POA: Diagnosis not present

## 2023-07-16 DIAGNOSIS — K620 Anal polyp: Secondary | ICD-10-CM | POA: Diagnosis not present

## 2023-07-16 DIAGNOSIS — E119 Type 2 diabetes mellitus without complications: Secondary | ICD-10-CM | POA: Insufficient documentation

## 2023-07-16 DIAGNOSIS — Z923 Personal history of irradiation: Secondary | ICD-10-CM | POA: Insufficient documentation

## 2023-07-16 DIAGNOSIS — K621 Rectal polyp: Secondary | ICD-10-CM | POA: Diagnosis not present

## 2023-07-16 HISTORY — PX: FLEXIBLE SIGMOIDOSCOPY: SHX5431

## 2023-07-16 LAB — GLUCOSE, CAPILLARY: Glucose-Capillary: 98 mg/dL (ref 70–99)

## 2023-07-16 SURGERY — SIGMOIDOSCOPY, FLEXIBLE
Anesthesia: General

## 2023-07-16 MED ORDER — PROPOFOL 500 MG/50ML IV EMUL
INTRAVENOUS | Status: DC | PRN
  Administered 2023-07-16: 100 ug/kg/min via INTRAVENOUS
  Administered 2023-07-16: 50 mg via INTRAVENOUS

## 2023-07-16 MED ORDER — LACTATED RINGERS IV SOLN: INTRAVENOUS | Status: DC

## 2023-07-16 MED ORDER — ORAL CARE MOUTH RINSE: 15.0000 mL | Freq: Once | OROMUCOSAL | Status: DC

## 2023-07-16 MED ORDER — CHLORHEXIDINE GLUCONATE 0.12 % MT SOLN: 15.0000 mL | Freq: Once | OROMUCOSAL | Status: DC

## 2023-07-16 NOTE — H&P (Signed)
 @LOGO @   Primary Care Physician:  Brantley Caldwell, NP Primary Gastroenterologist:   Dr. Riley Cheadle  Pre-Procedure History & Physical: HPI:  James Miles is a 78 y.o. male here for  follow-up above distal rectal adenocarcinoma status post chemo radiation therapy.  Hematology oncology is ask us  to look back lesion.  He is not seen colorectal surgeon as of yet.  Past Medical History:  Diagnosis Date   Back pain    COPD (chronic obstructive pulmonary disease) (HCC)    Diabetes mellitus without complication (HCC)    Dyspnea    Dysrhythmia    Hyperlipidemia    Oxygen dependent    4L when needed    Past Surgical History:  Procedure Laterality Date   A-FLUTTER ABLATION N/A 08/23/2022   Procedure: A-FLUTTER ABLATION;  Surgeon: Tammie Fall, MD;  Location: [facility];  Service: Cardiovascular;  Laterality: N/A;   BIOPSY  04/26/2022   Procedure: BIOPSY;  Surgeon: Suzette Espy, MD;  Location: [facility];  Service: Endoscopy;;   BLADDER INSTILLATION N/A 11/16/2022   Procedure: BLADDER INSTILLATION- Gemcitibine;  Surgeon: Marco Severs, MD;  Location: [facility];  Service: Urology;  Laterality: N/A;   COLONOSCOPY WITH PROPOFOL  N/A 04/26/2022   Procedure: COLONOSCOPY WITH PROPOFOL ;  Surgeon: Suzette Espy, MD;  Location: [facility];  Service: Endoscopy;  Laterality: N/A;  10:00am; ASA 3   CYSTOSCOPY N/A 11/16/2022   Procedure: CYSTOSCOPY;  Surgeon: Marco Severs, MD;  Location: [facility];  Service: Urology;  Laterality: N/A;   CYSTOSCOPY WITH INSERTION OF UROLIFT N/A 05/28/2023   Procedure: CYSTOSCOPY WITH INSERTION OF UROLIFT;  Surgeon: Marco Severs, MD;  Location: [facility];  Service: Urology;  Laterality: N/A;   hemorhoidectomy     IR IMAGING GUIDED PORT INSERTION  05/12/2022   POLYPECTOMY  04/26/2022   Procedure: POLYPECTOMY;  Surgeon: Suzette Espy, MD;  Location: [facility];  Service: Endoscopy;;   TRANSURETHRAL RESECTION OF BLADDER TUMOR N/A  11/16/2022   Procedure: TRANSURETHRAL RESECTION OF BLADDER TUMOR (TURBT);  Surgeon: Marco Severs, MD;  Location: [facility];  Service: Urology;  Laterality: N/A;    Prior to Admission medications   Medication Sig Start Date End Date Taking? Authorizing Provider  albuterol  (VENTOLIN  HFA) 108 (90 Base) MCG/ACT inhaler Inhale 2 puffs into the lungs every 6 (six) hours as needed for wheezing or shortness of breath. 03/29/19  Yes [provider]  febuxostat  (ULORIC ) 40 MG tablet Take 1 tablet (40 mg total) by mouth daily. Patient taking differently: Take 40 mg by mouth daily as needed (gout flare). 11/06/22  Yes Paulett Boros, MD  finasteride  (PROSCAR ) 5 MG tablet Take 1 tablet (5 mg total) by mouth daily. 03/21/23  Yes McKenzie, Arden Beck, MD  furosemide  (LASIX ) 20 MG tablet Take 1 tablet (20 mg total) by mouth daily as needed. 03/21/23  Yes Paulett Boros, MD  HYDROcodone -acetaminophen  (NORCO/VICODIN) 5-325 MG tablet Take 1 tablet by mouth every 6 (six) hours as needed for moderate pain (pain score 4-6). 05/28/23  Yes McKenzie, Arden Beck, MD  MELATONIN PO Take 1 tablet by mouth at bedtime as needed (sleep).   Yes [provider]  naproxen sodium (ALEVE) 220 MG tablet Take 220 mg by mouth daily as needed (pain).   Yes [provider]  prochlorperazine  (COMPAZINE ) 10 MG tablet Take 10 mg by mouth every 6 (six) hours as needed for nausea or vomiting.   Yes [provider]  silodosin  (  RAPAFLO ) 8 MG CAPS capsule Take 1 capsule (8 mg total) by mouth daily with breakfast. 03/21/23  Yes McKenzie, Arden Beck, MD  calcium  carbonate (TUMS - DOSED IN MG ELEMENTAL CALCIUM ) 500 MG chewable tablet Chew 1 tablet by mouth daily as needed for indigestion or heartburn.    [provider]  DM-Phenylephrine -Acetaminophen  (MUCINEX FAST-MAX CONG HEADACHE PO) Take 1-2 tablets by mouth daily as needed (congestion).    [provider]  lidocaine -prilocaine  (EMLA )  cream Apply 1 Application topically. 05/17/22   [provider]  loperamide  (IMODIUM ) 2 MG capsule Take 2 mg by mouth 3 (three) times daily as needed for diarrhea or loose stools.    [provider]  Misc. Devices MISC Please provide with shower chair 08/07/22   Paulett Boros, MD    Allergies as of 06/21/2023   (No Known Allergies)    History reviewed. No pertinent family history.  Social History   Socioeconomic History   Marital status: Widowed    Spouse name: Not on file   Number of children: 2   Years of education: Not on file   Highest education level: Not on file  Occupational History   Not on file  Tobacco Use   Smoking status: Former    Current packs/day: 0.00    Types: Cigarettes    Start date: 08/1966    Quit date: 08/2016    Years since quitting: 6.9   Smokeless tobacco: Never  Vaping Use   Vaping status: Never Used  Substance and Sexual Activity   Alcohol use: Never   Drug use: Never   Sexual activity: Not Currently  Other Topics Concern   Not on file  Social History Narrative   Not on file   Social Drivers of Health   Financial Resource Strain: Not on file  Food Insecurity: No Food Insecurity (02/19/2023)   Hunger Vital Sign    Worried About Running Out of Food in the Last Year: Never true    Ran Out of Food in the Last Year: Never true  Transportation Needs: No Transportation Needs (02/19/2023)   PRAPARE - Administrator, Civil Service (Medical): No    Lack of Transportation (Non-Medical): No  Physical Activity: Not on file  Stress: Not on file  Social Connections: Unknown (02/19/2023)   Social Connection and Isolation Panel [NHANES]    Frequency of Communication with Friends and Family: Patient unable to answer    Frequency of Social Gatherings with Friends and Family: Patient unable to answer    Attends Religious Services: Patient unable to answer    Active Member of Clubs or Organizations: Patient unable to answer     Attends Banker Meetings: Patient unable to answer    Marital Status: Widowed  Intimate Partner Violence: Not At Risk (02/19/2023)   Humiliation, Afraid, Rape, and Kick questionnaire    Fear of Current or Ex-Partner: No    Emotionally Abused: No    Physically Abused: No    Sexually Abused: No    Review of Systems: See HPI, otherwise negative ROS  Physical Exam: BP (!) 149/78   Pulse 65   Temp 97.9 F (36.6 C) (Oral)   Resp 17   Ht 5\' 11"  (1.803 m)   Wt 70.3 kg   SpO2 99%   BMI 21.62 kg/m  General:   Alert,  Well-developed, well-nourished, pleasant and cooperative in NAD Lungs:  Clear throughout to auscultation.   No wheezes, crackles, or rhonchi. No acute distress.  Heart:  Regular rate and rhythm; no murmurs, clicks, rubs,  or gallops. Abdomen: Non-distended, normal bowel sounds.  Soft and nontender without appreciable mass or hepatosplenomegaly.   Impression/Plan:    78 year old gentleman multiple comorbidities diagnosed with rectal cancer colonoscopy by me last year he has undergone chemoradiation.  It appears she has responded.  Dr. Cheree Cords would like reassessment of the rectal lesion and biopsies.  Sigmoidoscopy now being done for that purpose.The risks, benefits, limitations, alternatives and imponderables have been reviewed with the patient. Questions have been answered. All parties are agreeable.       Notice: This dictation was prepared with Dragon dictation along with smaller phrase technology. Any transcriptional errors that result from this process are unintentional and may not be corrected upon review.

## 2023-07-16 NOTE — Anesthesia Postprocedure Evaluation (Signed)
 Anesthesia Post Note  Patient: James Miles  Procedure(s) Performed: [name]  Patient location during evaluation: Endoscopy Anesthesia Type: General Level of consciousness: awake and alert Pain management: pain level controlled Vital Signs Assessment: post-procedure vital signs reviewed and stable Respiratory status: spontaneous breathing, nonlabored ventilation, respiratory function stable and patient connected to nasal cannula oxygen Cardiovascular status: blood pressure returned to baseline and stable Postop Assessment: no apparent nausea or vomiting Anesthetic complications: no   There were no known notable events for this encounter.   Last Vitals:  Vitals:   07/16/23 1555 07/16/23 1601  BP: (!) 93/57 (!) 102/54  Pulse: 69 70  Resp: 17 17  Temp:    SpO2: 94% 96%    Last Pain:  Vitals:   07/16/23 1547  TempSrc: Oral  PainSc: 0-No pain                 James Miles

## 2023-07-16 NOTE — Op Note (Signed)
 [facility] Patient Name: James Miles Procedure Date: 07/16/2023 3:16 PM MRN: [id number] Date of Birth: Jan 20, 1946 Attending MD: Gemma Kelp , MD, [id number] CSN: [id number] Age: 78 Admit Type: Outpatient Procedure:                Flexible Sigmoidoscopy Indications:              High risk colon cancer surveillance: Personal                            history of colon cancer (history of rectal cancer                            status post chemoradiation). Providers:                Gemma Kelp, MD, Vonna Guardian, Theola Fitch Referring MD:             Gemma Kelp, MD Medicines:                Propofol  per Anesthesia Complications:            No immediate complications. Estimated Blood Loss:     Estimated blood loss was minimal. Procedure:                Pre-Anesthesia Assessment:                           - Prior to the procedure, a History and Physical                            was performed, and patient medications and                            allergies were reviewed. The patient's tolerance of                            previous anesthesia was also reviewed. The risks                            and benefits of the procedure and the sedation                            options and risks were discussed with the patient.                            All questions were answered, and informed consent                            was obtained. Prior Anticoagulants: The patient has                            taken no anticoagulant or antiplatelet agents. ASA                            Grade Assessment: II - A patient with mild systemic  disease. After reviewing the risks and benefits,                            the patient was deemed in satisfactory condition to                            undergo the procedure.                           After obtaining informed consent, the scope was                            passed under direct vision.  The [phone])                            scope was introduced through the anus and advanced                            to the the rectum. The flexible sigmoidoscopy was                            accomplished without difficulty. The patient                            tolerated the procedure well. The quality of the                            bowel preparation was adequate. Scope In: 3:36:56 PM Scope Out: 3:44:47 PM Total Procedure Duration: 0 hours 7 minutes 51 seconds  Findings:      The perianal and digital rectal examinations were normal. Scar       identified in distal rectum on face and retroflexed. Slightly edematous       appearing mucosa but I did not see a tumor. There was a small polyp at       the anal verge less likely an anal papilla. No other rectal lesions were       seen. The area of scar biopsied multiple times. The tiny rectal polyp       was removed with cold biopsy forceps Impression:               - Rectal scar. No active tumor seen. Status post                            biopsy. Diminutive rectal polyp removed with cold                            biopsy forcep. No specimens collected. Moderate Sedation:      Moderate (conscious) sedation was personally administered by an       anesthesia professional. The following parameters were monitored: oxygen       saturation, heart rate, blood pressure, respiratory rate, EKG, adequacy       of pulmonary ventilation, and response to care. Recommendation:           - Advance diet as tolerated. Follow-up on  pathology. Follow-up with Dr. Katragadda as planned Procedure Code(s):        --- Professional ---                           [phone], 52, Sigmoidoscopy, flexible; diagnostic,                            including collection of specimen(s) by brushing or                            washing, when performed (separate procedure) Diagnosis Code(s):        --- Professional ---                            U04.540, Personal history of other malignant                            neoplasm of large intestine CPT copyright 2022 American Medical Association. All rights reserved. The codes documented in this report are preliminary and upon coder review may  be revised to meet current compliance requirements. [name]. [name], MD Gemma Kelp, MD 07/16/2023 3:51:46 PM This report has been signed electronically. Number of Addenda: 0

## 2023-07-16 NOTE — Discharge Instructions (Addendum)
    Sigmoidoscopy Discharge Instructions  Read the instructions outlined below and refer to this sheet in the next few weeks. These discharge instructions provide you with general information on caring for yourself after you leave the hospital. Your doctor may also give you specific instructions. While your treatment has been planned according to the most current medical practices available, unavoidable complications occasionally occur. If you have any problems or questions after discharge, call Dr. Riley Cheadle at [phone]. ACTIVITY You may resume your regular activity, but move at a slower pace for the next 24 hours.  Take frequent rest periods for the next 24 hours.  Walking will help get rid of the air and reduce the bloated feeling in your belly (abdomen).  No driving for 24 hours (because of the medicine (anesthesia) used during the test).   Do not sign any important legal documents or operate any machinery for 24 hours (because of the anesthesia used during the test).  NUTRITION Drink plenty of fluids.  You may resume your normal diet as instructed by your doctor.  Begin with a light meal and progress to your normal diet. Heavy or fried foods are harder to digest and may make you feel sick to your stomach (nauseated).  Avoid alcoholic beverages for 24 hours or as instructed.  MEDICATIONS You may resume your normal medications unless your doctor tells you otherwise.  WHAT YOU CAN EXPECT TODAY Some feelings of bloating in the abdomen.  Passage of more gas than usual.  Spotting of blood in your stool or on the toilet paper.  IF YOU HAD POLYPS REMOVED DURING THE COLONOSCOPY: No aspirin products for 7 days or as instructed.  No alcohol for 7 days or as instructed.  Eat a soft diet for the next 24 hours.  FINDING OUT THE RESULTS OF YOUR TEST Not all test results are available during your visit. If your test results are not back during the visit, make an appointment with your caregiver to find out  the results. Do not assume everything is normal if you have not heard from your caregiver or the medical facility. It is important for you to follow up on all of your test results.  SEEK IMMEDIATE MEDICAL ATTENTION IF: You have more than a spotting of blood in your stool.  Your belly is swollen (abdominal distention).  You are nauseated or vomiting.  You have a temperature over 101.  You have abdominal pain or discomfort that is severe or gets worse throughout the day.       scar found in your rectum along with a small polyp.  I did not see tumor.  Biopsies of scar taken polyp removed  Further recommendations to follow pending review of pathology report   you may pass a little blood with your next bowel movement or 2 but should not be bad at all

## 2023-07-16 NOTE — Anesthesia Preprocedure Evaluation (Signed)
 Anesthesia Evaluation  Patient identified by MRN, date of birth, ID band Patient awake    Reviewed: Allergy & Precautions, H&P , NPO status , Patient's Chart, lab work & pertinent test results, reviewed documented beta blocker date and time   Airway Mallampati: II  TM Distance: >3 FB Neck ROM: full    Dental no notable dental hx.    Pulmonary shortness of breath, COPD, former smoker   Pulmonary exam normal breath sounds clear to auscultation       Cardiovascular Exercise Tolerance: Good hypertension, Normal cardiovascular exam+ dysrhythmias  Rhythm:regular Rate:Normal     Neuro/Psych negative neurological ROS  negative psych ROS   GI/Hepatic negative GI ROS, Neg liver ROS,,,  Endo/Other  diabetes, Type 2    Renal/GU negative Renal ROS  negative genitourinary   Musculoskeletal   Abdominal   Peds  Hematology  (+) Blood dyscrasia, anemia   Anesthesia Other Findings   Reproductive/Obstetrics negative OB ROS                             Anesthesia Physical Anesthesia Plan  ASA: 3  Anesthesia Plan: General   Post-op Pain Management: Minimal or no pain anticipated   Induction: Intravenous  PONV Risk Score and Plan: Propofol  infusion  Airway Management Planned: Natural Airway and Nasal Cannula  Additional Equipment: None  Intra-op Plan:   Post-operative Plan:   Informed Consent: I have reviewed the patients History and Physical, chart, labs and discussed the procedure including the risks, benefits and alternatives for the proposed anesthesia with the patient or authorized representative who has indicated his/her understanding and acceptance.     Dental Advisory Given  Plan Discussed with: CRNA  Anesthesia Plan Comments:        Anesthesia Quick Evaluation

## 2023-07-16 NOTE — Transfer of Care (Signed)
 Immediate Anesthesia Transfer of Care Note  Patient: James Miles  Procedure(s) Performed: [name]  Patient Location: Endoscopy Unit  Anesthesia Type:General  Level of Consciousness: drowsy  Airway & Oxygen Therapy: Patient Spontanous Breathing and Patient connected to nasal cannula oxygen  Post-op Assessment: Report given to RN and Post -op Vital signs reviewed and stable  Post vital signs: Reviewed and stable  Last Vitals:  Vitals Value Taken Time  BP 88/40   Temp    Pulse 72   Resp 14   SpO2 90%     Last Pain:  Vitals:   07/16/23 1534  TempSrc:   PainSc: 0-No pain      Patients Stated Pain Goal: 6 (07/16/23 1357)  Complications: No notable events documented. Pt on 4L [place] which is his home O2 requirement.

## 2023-07-17 ENCOUNTER — Encounter (HOSPITAL_COMMUNITY): Payer: Self-pay | Admitting: Internal Medicine

## 2023-07-18 ENCOUNTER — Ambulatory Visit: Payer: Self-pay | Admitting: Internal Medicine

## 2023-07-18 LAB — SURGICAL PATHOLOGY

## 2023-07-23 MED ORDER — MIRABEGRON ER 25 MG PO TB24: 25.0000 mg | ORAL_TABLET | Freq: Every day | ORAL | 11 refills | Status: DC

## 2023-07-23 NOTE — Telephone Encounter (Signed)
 I called and informed patient's son of MD's response and Rx was sent to pharmacy.

## 2023-07-23 NOTE — Addendum Note (Signed)
 Addended by: Dorma Gash on: 07/23/2023 09:14 AM   Modules accepted: Orders

## 2023-07-31 ENCOUNTER — Encounter (HOSPITAL_COMMUNITY): Payer: Self-pay

## 2023-07-31 ENCOUNTER — Ambulatory Visit (HOSPITAL_COMMUNITY)
Admission: RE | Admit: 2023-07-31 | Discharge: 2023-07-31 | Disposition: A | Discharge status: Home / Self Care | Source: Ambulatory Visit | Attending: Hematology | Admitting: Hematology

## 2023-07-31 ENCOUNTER — Inpatient Hospital Stay: Attending: Hematology

## 2023-07-31 DIAGNOSIS — Z87891 Personal history of nicotine dependence: Secondary | ICD-10-CM | POA: Insufficient documentation

## 2023-07-31 DIAGNOSIS — C20 Malignant neoplasm of rectum: Secondary | ICD-10-CM

## 2023-07-31 DIAGNOSIS — D508 Other iron deficiency anemias: Secondary | ICD-10-CM

## 2023-07-31 DIAGNOSIS — Z923 Personal history of irradiation: Secondary | ICD-10-CM | POA: Diagnosis not present

## 2023-07-31 DIAGNOSIS — Z9221 Personal history of antineoplastic chemotherapy: Secondary | ICD-10-CM | POA: Insufficient documentation

## 2023-07-31 DIAGNOSIS — I4892 Unspecified atrial flutter: Secondary | ICD-10-CM | POA: Diagnosis not present

## 2023-07-31 DIAGNOSIS — D4122 Neoplasm of uncertain behavior of left ureter: Secondary | ICD-10-CM | POA: Diagnosis not present

## 2023-07-31 DIAGNOSIS — Z7901 Long term (current) use of anticoagulants: Secondary | ICD-10-CM | POA: Diagnosis not present

## 2023-07-31 LAB — CBC WITH DIFFERENTIAL/PLATELET
Abs Immature Granulocytes: 0.05 10*3/uL (ref 0.00–0.07)
Basophils Absolute: 0 10*3/uL (ref 0.0–0.1)
Basophils Relative: 0 %
Eosinophils Absolute: 0.3 10*3/uL (ref 0.0–0.5)
Eosinophils Relative: 2 %
HCT: 35.1 % — ABNORMAL LOW (ref 39.0–52.0)
Hemoglobin: 10.6 g/dL — ABNORMAL LOW (ref 13.0–17.0)
Immature Granulocytes: 0 %
Lymphocytes Relative: 13 %
Lymphs Abs: 1.8 10*3/uL (ref 0.7–4.0)
MCH: 28 pg (ref 26.0–34.0)
MCHC: 30.2 g/dL (ref 30.0–36.0)
MCV: 92.9 fL (ref 80.0–100.0)
Monocytes Absolute: 0.9 10*3/uL (ref 0.1–1.0)
Monocytes Relative: 7 %
Neutro Abs: 10.2 10*3/uL — ABNORMAL HIGH (ref 1.7–7.7)
Neutrophils Relative %: 78 %
Platelets: 272 10*3/uL (ref 150–400)
RBC: 3.78 MIL/uL — ABNORMAL LOW (ref 4.22–5.81)
RDW: 14 % (ref 11.5–15.5)
WBC: 13.2 10*3/uL — ABNORMAL HIGH (ref 4.0–10.5)
nRBC: 0 % (ref 0.0–0.2)

## 2023-07-31 LAB — COMPREHENSIVE METABOLIC PANEL WITH GFR
ALT: 13 U/L (ref 0–44)
AST: 14 U/L — ABNORMAL LOW (ref 15–41)
Albumin: 3.4 g/dL — ABNORMAL LOW (ref 3.5–5.0)
Alkaline Phosphatase: 85 U/L (ref 38–126)
Anion gap: 11 (ref 5–15)
BUN: 21 mg/dL (ref 8–23)
CO2: 27 mmol/L (ref 22–32)
Calcium: 9.2 mg/dL (ref 8.9–10.3)
Chloride: 101 mmol/L (ref 98–111)
Creatinine, Ser: 0.96 mg/dL (ref 0.61–1.24)
GFR, Estimated: >60 mL/min (ref >60)
Glucose, Bld: 131 mg/dL — ABNORMAL HIGH (ref 70–99)
Potassium: 4.5 mmol/L (ref 3.5–5.1)
Sodium: 139 mmol/L (ref 135–145)
Total Bilirubin: 0.7 mg/dL (ref 0.0–1.2)
Total Protein: 7.1 g/dL (ref 6.5–8.1)

## 2023-07-31 LAB — IRON AND TIBC
Iron: 17 ug/dL — ABNORMAL LOW (ref 45–182)
Saturation Ratios: 7 % — ABNORMAL LOW (ref 17.9–39.5)
TIBC: 250 ug/dL (ref 250–450)
UIBC: 233 ug/dL

## 2023-07-31 LAB — MAGNESIUM: Magnesium: 1.9 mg/dL (ref 1.7–2.4)

## 2023-07-31 LAB — FERRITIN: Ferritin: 145 ng/mL (ref 24–336)

## 2023-07-31 MED ORDER — IOHEXOL 300 MG/ML  SOLN
100.0000 mL | Freq: Once | INTRAMUSCULAR | Status: DC | PRN
Start: 2023-07-31 — End: 2023-08-01

## 2023-07-31 MED ORDER — HEPARIN SOD (PORK) LOCK FLUSH 100 UNIT/ML IV SOLN
500.0000 [IU] | Freq: Once | INTRAVENOUS | Status: AC
  Administered 2023-07-31: 500 [IU] via INTRAVENOUS

## 2023-07-31 MED ORDER — SODIUM CHLORIDE 0.9% FLUSH
10.0000 mL | Freq: Once | INTRAVENOUS | Status: AC
  Administered 2023-07-31: 10 mL via INTRAVENOUS

## 2023-07-31 MED ORDER — IOHEXOL 9 MG/ML PO SOLN: 500.0000 mL | ORAL | Status: AC

## 2023-07-31 NOTE — Progress Notes (Signed)
Patients port flushed without difficulty.  Good blood return noted with no bruising or swelling noted at site.  Patient remains accessed for CT scan.  °

## 2023-07-31 NOTE — Progress Notes (Signed)
 Patients port flushed without difficulty.  Good blood return noted with no bruising or swelling noted at site.  Band aid applied.  VSS with discharge and left in satisfactory condition with no s/s of distress noted.

## 2023-07-31 NOTE — Addendum Note (Signed)
 Addended by: JENELLE DOROTHYANN PARAS on: 07/31/2023 02:02 PM   Modules accepted: Orders

## 2023-08-01 LAB — CEA: CEA: 2.1 ng/mL (ref 0.0–4.7)

## 2023-08-06 ENCOUNTER — Inpatient Hospital Stay: Admitting: Hematology

## 2023-08-13 ENCOUNTER — Ambulatory Visit (HOSPITAL_COMMUNITY)
Admission: RE | Admit: 2023-08-13 | Discharge: 2023-08-13 | Disposition: A | Discharge status: Home / Self Care | Source: Ambulatory Visit | Attending: Hematology | Admitting: Hematology

## 2023-08-13 DIAGNOSIS — K5730 Diverticulosis of large intestine without perforation or abscess without bleeding: Secondary | ICD-10-CM | POA: Diagnosis not present

## 2023-08-13 DIAGNOSIS — C20 Malignant neoplasm of rectum: Secondary | ICD-10-CM | POA: Diagnosis not present

## 2023-08-13 DIAGNOSIS — K429 Umbilical hernia without obstruction or gangrene: Secondary | ICD-10-CM | POA: Diagnosis not present

## 2023-08-13 DIAGNOSIS — R911 Solitary pulmonary nodule: Secondary | ICD-10-CM | POA: Diagnosis not present

## 2023-08-13 MED ORDER — IOHEXOL 300 MG/ML  SOLN
100.0000 mL | Freq: Once | INTRAMUSCULAR | Status: AC | PRN
Start: 2023-08-13 — End: 2023-08-13
  Administered 2023-08-13: 100 mL via INTRAVENOUS

## 2023-08-20 ENCOUNTER — Ambulatory Visit: Admitting: Hematology

## 2023-08-20 ENCOUNTER — Inpatient Hospital Stay: Attending: Hematology | Admitting: Hematology

## 2023-08-20 VITALS — BP 131/69 | HR 65 | Temp 97.9°F | Resp 16

## 2023-08-20 DIAGNOSIS — I4892 Unspecified atrial flutter: Secondary | ICD-10-CM | POA: Insufficient documentation

## 2023-08-20 DIAGNOSIS — Z923 Personal history of irradiation: Secondary | ICD-10-CM | POA: Diagnosis not present

## 2023-08-20 DIAGNOSIS — D508 Other iron deficiency anemias: Secondary | ICD-10-CM | POA: Diagnosis not present

## 2023-08-20 DIAGNOSIS — C20 Malignant neoplasm of rectum: Secondary | ICD-10-CM | POA: Diagnosis not present

## 2023-08-20 DIAGNOSIS — Z87891 Personal history of nicotine dependence: Secondary | ICD-10-CM | POA: Diagnosis not present

## 2023-08-20 DIAGNOSIS — Z7901 Long term (current) use of anticoagulants: Secondary | ICD-10-CM | POA: Insufficient documentation

## 2023-08-20 DIAGNOSIS — Z9221 Personal history of antineoplastic chemotherapy: Secondary | ICD-10-CM | POA: Diagnosis not present

## 2023-08-20 DIAGNOSIS — D4122 Neoplasm of uncertain behavior of left ureter: Secondary | ICD-10-CM | POA: Insufficient documentation

## 2023-08-20 MED ORDER — LEVOFLOXACIN 500 MG PO TABS: 500.0000 mg | ORAL_TABLET | Freq: Every day | ORAL | 0 refills | Status: DC

## 2023-08-20 NOTE — Patient Instructions (Addendum)
 [facility] at [facility] Discharge Instructions   You were seen and examined today by Dr. Cheree Cords.  He reviewed the results of your lab work which are normal/stable.   He reviewed the results of your CT scan which did not show any evidence of cancer.   We will see you back in 3 months.   Return as scheduled.    Thank you for choosing [facility] at [facility] to provide your oncology and hematology care.  To afford each patient quality time with our provider, please arrive at least 15 minutes before your scheduled appointment time.   If you have a lab appointment with the Cancer Center please come in thru the Main Entrance and check in at the main information desk.  You need to re-schedule your appointment should you arrive 10 or more minutes late.  We strive to give you quality time with our providers, and arriving late affects you and other patients whose appointments are after yours.  Also, if you no show three or more times for appointments you may be dismissed from the clinic at the providers discretion.     Again, thank you for choosing [facility]  Our hope is that these requests will decrease the amount of time that you wait before being seen by our physicians.       _____________________________________________________________  Should you have questions after your visit to [facility], please contact our office at [phone] and follow the prompts.  Our office hours are 8:00 a.m. and 4:30 p.m. Monday - Friday.  Please note that voicemails left after 4:00 p.m. may not be returned until the following business day.  We are closed weekends and major holidays.  You do have access to a nurse 24-7, just call the main number to the clinic [phone] and do not press any options, hold on the line and a nurse will answer the phone.    For prescription refill requests, have your pharmacy contact our office and  allow 72 hours.    Due to Covid, you will need to wear a mask upon entering the hospital. If you do not have a mask, a mask will be given to you at the Main Entrance upon arrival. For doctor visits, patients may have 1 support person age 77 or older with them. For treatment visits, patients can not have anyone with them due to social distancing guidelines and our immunocompromised population.

## 2023-08-20 NOTE — Progress Notes (Signed)
 [facility] [address] [place] [address]    Clinic Day:  08/20/2023  Referring physician: [name], [place] Ce*  Patient Care Team: [name], [name], NP as PCP - General [name], MD as PCP - Cardiology (Cardiology) [name], MD as PCP - Electrophysiology (Cardiology) [name], RN as Oncology Nurse Navigator (Medical Oncology) Rogers Hai, MD as Medical Oncologist (Medical Oncology)   ASSESSMENT & PLAN:   Assessment: 1.  Stage II (T3 N0 M0) low rectal adenocarcinoma, MMR preserved: - 66-month history of intermittent rectal bleeding.  No weight loss. - Colonoscopy (04/26/2022): Large cauliflower mass palpated in the distal rectum on DRE.  Scattered diverticula in the descending colon.  5 mm polyp in the ascending colon.  In the rectum, exophytic semilunar neoplastic appearing process beginning at the anal verge and extending proximally about 6 cm. - Pathology (04/26/2022): Invasive moderately differentiated adenocarcinoma of the rectal mass.  MMR preserved. - CT CAP (05/01/2022): Nodular wall thickening along the rectum.  No intrinsic abnormal lymph nodes.  No liver lesion.  Fatty liver.  Nodule along the left side of the urinary bladder.  Small mass along the course of the distal left ureter.  Worrisome for multifocal TCC.  Small lung nodules measuring up to 4 mm. - MRI pelvis (05/03/2022): 6.4 cm left lateral low rectal tumor abutting the internal anal sphincter, T3c N0.  Distance from tumor to the internal anal sphincter is 0 cm.  Tumor measures 6.4 cm in length and up to 2.2 cm in thickness. - PET scan (05/11/2022): No evidence of metastatic disease. - Met with Dr. Debby on 05/16/2022: She is in agreement with TNT and has discussed APR with colostomy. - TNT: 8 cycles of FOLFOX from 06/07/2022 through 09/25/2022. - Chemoradiation therapy with Xeloda  1500 mg twice daily started on 12/28/2022 completed on 02/14/2023. - He is on  close surveillance without surgery.   2.  Social/family history: -He lives at home and is independent of ADLs and IADLs.  He is accompanied by his son today.  Worked at [employer] prior to retirement.  Also served in [place] but denied any exposure to agent orange.  Quit smoking 1 year ago.  Smoked 1 to 2 packs/day for the last 61 years.  Started at age 51. - No family history of malignancies.    Plan: 1.  Stage II (T3 cN0 M0) low rectal adenocarcinoma, MMR preserved: - Last MRI of pelvis on 06/11/2023: No evidence of rectal mass or adenopathy. - He had sigmoidoscopy on 07/16/2023 by Dr. Shaaron.  No evidence of malignancy.  Biopsies were also negative reviewed by me. - CT CAP (08/13/2023): No evidence of recurrence.  He has right lower lobe infiltrates. - We will give him Levaquin  500 milligrams daily for 10 days.  He has cough and clear expectoration. - Reviewed labs from 07/31/2023: Creatinine and LFTs are normal.  Ferritin is 145.  Hemoglobin is 10.6, down from 12.3.  He denies any rectal bleeding. - Recommend follow-up in 3 months with MRI pelvis (rectal cancer protocol), CEA and labs.  Will do CT CAP in 6 months.  He will have repeat sigmoidoscopy in 3 to 6 months.  We discussed surveillance plan in detail.   2.  Left urinary bladder mass and distal left ureteral mass: - He follows up with Dr. Sherrilee.  He has Foley catheter.     Orders Placed This Encounter  Procedures   MR PELVIS WO CM RECTAL  CA STAGING    Standing Status:   Future    Expected Date:   11/20/2023    Expiration Date:   08/19/2024    If indicated for the ordered procedure, I authorize the administration of contrast media per Radiology protocol:   Yes    What is the patient's sedation requirement?:   No Sedation    Does the patient have a pacemaker or implanted devices?:   No    Preferred imaging location?:   [facility] (table limit - 500lbs)   CBC with Differential    Standing Status:   Future     Expected Date:   11/19/2023    Expiration Date:   02/17/2024   Comprehensive metabolic panel    Standing Status:   Future    Expected Date:   11/19/2023    Expiration Date:   02/17/2024   CEA    Standing Status:   Future    Expected Date:   11/19/2023    Expiration Date:   02/17/2024   Iron and TIBC ([facility] ONLY)    Standing Status:   Future    Expected Date:   11/19/2023    Expiration Date:   02/17/2024   Ferritin    Standing Status:   Future    Expected Date:   11/19/2023    Expiration Date:   02/17/2024      James Miles,acting as a scribe for Alean Stands, MD.,have documented all relevant documentation on the behalf of Alean Stands, MD,as directed by  Alean Stands, MD while in the presence of Alean Stands, MD.  I, Alean Stands MD, have reviewed the above documentation for accuracy and completeness, and I agree with the above.     Alean Stands, MD   7/14/20255:17 PM  CHIEF COMPLAINT:   Diagnosis: Rectal adenocarcinoma    Cancer Staging  Rectal adenocarcinoma [facility]) Staging form: Colon and Rectum, AJCC 8th Edition - Clinical stage from 05/07/2022: Stage IIA (cT3, cN0, cM0) - Unsigned    Prior Therapy: FOLFOX, 8 cycles, 06/07/22 - 09/25/22  Current Therapy:  Concurrent chemoradiation with Xeloda , to be followed by surgery    HISTORY OF PRESENT ILLNESS:   Oncology History  Rectal adenocarcinoma (HCC)  05/07/2022 Initial Diagnosis   Rectal adenocarcinoma (HCC)   06/07/2022 -  Chemotherapy   Patient is on Treatment Plan : COLORECTAL FOLFOX q14d x 4 months        INTERVAL HISTORY:   James Miles is a 78 y.o. male presenting to clinic today for follow up of Rectal adenocarcinoma. He was last seen by me on 06/20/23.  Since his last visit, he underwent sigmoidoscopy on 07/16/23 with Dr. Shaaron. Pathology of rectal polyp and rectal tumor scar were negative for dysplasia and carcinoma.   Today, he states that he is  doing well overall. His appetite level is at 50%. His energy level is at 50%.   PAST MEDICAL HISTORY:   Past Medical History: Past Medical History:  Diagnosis Date   Back pain    COPD (chronic obstructive pulmonary disease) (HCC)    Diabetes mellitus without complication (HCC)    Dyspnea    Dysrhythmia    Hyperlipidemia    Oxygen dependent    4L when needed    Surgical History: Past Surgical History:  Procedure Laterality Date   A-FLUTTER ABLATION N/A 08/23/2022   Procedure: A-FLUTTER ABLATION;  Surgeon: [name], MD;  Location: [facility];  Service: Cardiovascular;  Laterality: N/A;  BIOPSY  04/26/2022   Procedure: BIOPSY;  Surgeon: Shaaron Lamar HERO, MD;  Location: [facility] ENDO [facility];  Service: Endoscopy;;   BLADDER INSTILLATION N/A 11/16/2022   Procedure: BLADDER INSTILLATION- Gemcitibine;  Surgeon: Sherrilee Belvie CROME, MD;  Location: [facility] ORS;  Service: Urology;  Laterality: N/A;   COLONOSCOPY WITH PROPOFOL  N/A 04/26/2022   Procedure: COLONOSCOPY WITH PROPOFOL ;  Surgeon: Shaaron Lamar HERO, MD;  Location: [facility];  Service: Endoscopy;  Laterality: N/A;  10:00am; ASA 3   CYSTOSCOPY N/A 11/16/2022   Procedure: CYSTOSCOPY;  Surgeon: Sherrilee Belvie CROME, MD;  Location: [facility] OR[facility];  Service: Urology;  Laterality: N/A;   CYSTOSCOPY WITH INSERTION OF UROLIFT N/A 05/28/2023   Procedure: CYSTOSCOPY WITH INSERTION OF UROLIFT;  Surgeon: Sherrilee Belvie CROME, MD;  Location: [facility] OR[facility];  Service: Urology;  Laterality: N/A;   FLEXIBLE SIGMOIDOSCOPY N/A 07/16/2023   Procedure: KINGSTON SIDE;  Surgeon: Shaaron Lamar HERO, MD;  Location: [facility];  Service: Endoscopy;  Laterality: N/A;  230pm, ok rm 1/2   hemorhoidectomy     IR IMAGING GUIDED PORT INSERTION  05/12/2022   POLYPECTOMY  04/26/2022   Procedure: POLYPECTOMY;  Surgeon: Shaaron Lamar HERO, MD;  Location: [facility];  Service: Endoscopy;;   TRANSURETHRAL RESECTION OF BLADDER TUMOR N/A 11/16/2022   Procedure: TRANSURETHRAL  RESECTION OF BLADDER TUMOR (TURBT);  Surgeon: Sherrilee Belvie CROME, MD;  Location: [facility];  Service: Urology;  Laterality: N/A;    Social History: Social History   Socioeconomic History   Marital status: Widowed    Spouse name: Not on file   Number of children: 2   Years of education: Not on file   Highest education level: Not on file  Occupational History   Not on file  Tobacco Use   Smoking status: Former    Current packs/day: 0.00    Types: Cigarettes    Start date: 08/1966    Quit date: 08/2016    Years since quitting: 7.0   Smokeless tobacco: Never  Vaping Use   Vaping status: Never Used  Substance and Sexual Activity   Alcohol use: Never   Drug use: Never   Sexual activity: Not Currently  Other Topics Concern   Not on file  Social History Narrative   Not on file   Social Drivers of Health   Financial Resource Strain: Not on file  Food Insecurity: No Food Insecurity (02/19/2023)   Hunger Vital Sign    Worried About Running Out of Food in the Last Year: Never true    Ran Out of Food in the Last Year: Never true  Transportation Needs: No Transportation Needs (02/19/2023)   PRAPARE - Administrator, Civil Service (Medical): No    Lack of Transportation (Non-Medical): No  Physical Activity: Not on file  Stress: Not on file  Social Connections: Unknown (02/19/2023)   Social Connection and Isolation Panel    Frequency of Communication with Friends and Family: Patient unable to answer    Frequency of Social Gatherings with Friends and Family: Patient unable to answer    Attends Religious Services: Patient unable to answer    Active Member of Clubs or Organizations: Patient unable to answer    Attends Banker Meetings: Patient unable to answer    Marital Status: Widowed  Intimate Partner Violence: Not At Risk (02/19/2023)   Humiliation, Afraid, Rape, and Kick questionnaire    Fear of Current or Ex-Partner: No    Emotionally Abused: No  Physically Abused: No    Sexually Abused: No    Family History: No family history on file.  Current Medications:  Current Outpatient Medications:    albuterol  (VENTOLIN  HFA) 108 (90 Base) MCG/ACT inhaler, Inhale 2 puffs into the lungs every 6 (six) hours as needed for wheezing or shortness of breath., Disp: , Rfl:    calcium  carbonate (TUMS - DOSED IN MG ELEMENTAL CALCIUM ) 500 MG chewable tablet, Chew 1 tablet by mouth daily as needed for indigestion or heartburn., Disp: , Rfl:    DM-Phenylephrine -Acetaminophen  (MUCINEX FAST-MAX CONG HEADACHE PO), Take 1-2 tablets by mouth daily as needed (congestion)., Disp: , Rfl:    febuxostat  (ULORIC ) 40 MG tablet, Take 1 tablet (40 mg total) by mouth daily. (Patient taking differently: Take 40 mg by mouth daily as needed (gout flare).), Disp: 90 tablet, Rfl: 1   finasteride  (PROSCAR ) 5 MG tablet, Take 1 tablet (5 mg total) by mouth daily., Disp: 90 tablet, Rfl: 3   furosemide  (LASIX ) 20 MG tablet, Take 1 tablet (20 mg total) by mouth daily as needed., Disp: 30 tablet, Rfl: 2   GAVILYTE-G 236 g solution, Take 4,000 mLs by mouth once., Disp: , Rfl:    HYDROcodone -acetaminophen  (NORCO/VICODIN) 5-325 MG tablet, Take 1 tablet by mouth every 6 (six) hours as needed for moderate pain (pain score 4-6)., Disp: 15 tablet, Rfl: 0   levofloxacin  (LEVAQUIN ) 500 MG tablet, Take 1 tablet (500 mg total) by mouth daily., Disp: 10 tablet, Rfl: 0   lidocaine -prilocaine  (EMLA ) cream, Apply 1 Application topically., Disp: , Rfl:    loperamide  (IMODIUM ) 2 MG capsule, Take 2 mg by mouth 3 (three) times daily as needed for diarrhea or loose stools., Disp: , Rfl:    MELATONIN PO, Take 1 tablet by mouth at bedtime as needed (sleep)., Disp: , Rfl:    mirabegron  ER (MYRBETRIQ ) 25 MG TB24 tablet, Take 1 tablet (25 mg total) by mouth daily., Disp: 30 tablet, Rfl: 11   Misc. Devices MISC, Please provide with shower chair, Disp: 1 Units, Rfl: 0   naproxen sodium (ALEVE) 220 MG tablet,  Take 220 mg by mouth daily as needed (pain)., Disp: , Rfl:    prochlorperazine  (COMPAZINE ) 10 MG tablet, Take 10 mg by mouth every 6 (six) hours as needed for nausea or vomiting., Disp: , Rfl:    silodosin  (RAPAFLO ) 8 MG CAPS capsule, Take 1 capsule (8 mg total) by mouth daily with breakfast., Disp: 30 capsule, Rfl: 11   Allergies: No Known Allergies  REVIEW OF SYSTEMS:   Review of Systems  Constitutional:  Negative for chills, fatigue and fever.  HENT:   Negative for lump/mass, mouth sores, nosebleeds, sore throat and trouble swallowing.   Eyes:  Negative for eye problems.  Respiratory:  Positive for cough and shortness of breath.   Cardiovascular:  Negative for chest pain, leg swelling and palpitations.  Gastrointestinal:  Negative for abdominal pain, constipation, diarrhea, nausea and vomiting.  Genitourinary:  Negative for bladder incontinence, difficulty urinating, dysuria, frequency, hematuria and nocturia.   Musculoskeletal:  Negative for arthralgias, back pain, flank pain, myalgias and neck pain.  Skin:  Negative for itching and rash.  Neurological:  Negative for dizziness, headaches and numbness.  Hematological:  Does not bruise/bleed easily.  Psychiatric/Behavioral:  Positive for sleep disturbance. Negative for depression and suicidal ideas. The patient is not nervous/anxious.   All other systems reviewed and are negative.    VITALS:   Blood pressure 131/69, pulse 65, temperature 97.9 F (36.6 C),  temperature source Oral, resp. rate 16, SpO2 97%.  Wt Readings from Last 3 Encounters:  07/16/23 155 lb (70.3 kg)  05/28/23 150 lb 6.4 oz (68.2 kg)  05/21/23 150 lb 6.4 oz (68.2 kg)    There is no height or weight on file to calculate BMI.  Performance status (ECOG): 1 - Symptomatic but completely ambulatory  PHYSICAL EXAM:   Physical Exam Vitals and nursing note reviewed. Exam conducted with a chaperone present.  Constitutional:      Appearance: Normal appearance.   Cardiovascular:     Rate and Rhythm: Normal rate and regular rhythm.     Pulses: Normal pulses.     Heart sounds: Normal heart sounds.  Pulmonary:     Effort: Pulmonary effort is normal.     Breath sounds: Normal breath sounds.  Abdominal:     Palpations: Abdomen is soft. There is no hepatomegaly, splenomegaly or mass.     Tenderness: There is no abdominal tenderness.  Musculoskeletal:     Right lower leg: No edema.     Left lower leg: No edema.  Lymphadenopathy:     Cervical: No cervical adenopathy.     Right cervical: No superficial, deep or posterior cervical adenopathy.    Left cervical: No superficial, deep or posterior cervical adenopathy.     Upper Body:     Right upper body: No supraclavicular or axillary adenopathy.     Left upper body: No supraclavicular or axillary adenopathy.  Neurological:     General: No focal deficit present.     Mental Status: He is alert and oriented to person, place, and time.  Psychiatric:        Mood and Affect: Mood normal.        Behavior: Behavior normal.     LABS:      Latest Ref Rng & Units 07/31/2023    1:29 PM 06/11/2023    2:35 PM 03/06/2023    9:51 AM  CBC  WBC 4.0 - 10.5 K/uL 13.2  10.3  6.6   Hemoglobin 13.0 - 17.0 g/dL 89.3  87.6  9.8   Hematocrit 39.0 - 52.0 % 35.1  39.8  32.2   Platelets 150 - 400 K/uL 272  278  312       Latest Ref Rng & Units 07/31/2023    1:29 PM 06/11/2023    2:35 PM 03/06/2023    9:51 AM  CMP  Glucose 70 - 99 mg/dL 868  888  885   BUN 8 - 23 mg/dL 21  19  22    Creatinine 0.61 - 1.24 mg/dL 9.03  9.07  8.79   Sodium 135 - 145 mmol/L 139  135  139   Potassium 3.5 - 5.1 mmol/L 4.5  4.0  4.0   Chloride 98 - 111 mmol/L 101  98  103   CO2 22 - 32 mmol/L 27  25  29    Calcium  8.9 - 10.3 mg/dL 9.2  9.4  8.9   Total Protein 6.5 - 8.1 g/dL 7.1  7.4  6.6   Total Bilirubin 0.0 - 1.2 mg/dL 0.7  0.7  0.5   Alkaline Phos 38 - 126 U/L 85  78  60   AST 15 - 41 U/L 14  13  16    ALT 0 - 44 U/L 13  13  15        Lab Results  Component Value Date   CEA1 2.1 07/31/2023   /  CEA  Date Value Ref  Range Status  07/31/2023 2.1 0.0 - 4.7 ng/mL Final    Comment:    (NOTE)                             Nonsmokers          <3.9                             Smokers             <5.6 Roche [employer]ostics Electrochemiluminescence Immunoassay (ECLIA) Values obtained with different assay methods or kits cannot be used interchangeably.  Results cannot be interpreted as absolute evidence of the presence or absence of malignant disease. Performed At: [facility] [address] [place], [place] [id number] Jennette Shorter MD Ey:[phone]    No results found for: PSA1 No results found for: CAN199 No results found for: CAN125  No results found for: TOTALPROTELP, ALBUMINELP, A1GS, A2GS, BETS, BETA2SER, GAMS, MSPIKE, SPEI Lab Results  Component Value Date   TIBC 250 07/31/2023   FERRITIN 145 07/31/2023   IRONPCTSAT 7 (L) 07/31/2023   No results found for: LDH   STUDIES:   CT CHEST ABDOMEN PELVIS W CONTRAST Result Date: 08/14/2023 CLINICAL DATA:  Rectal cancer surveillance * Tracking Code: BO * EXAM: CT CHEST, ABDOMEN, AND PELVIS WITH CONTRAST TECHNIQUE: Multidetector CT imaging of the chest, abdomen and pelvis was performed following the standard protocol during bolus administration of intravenous contrast. RADIATION DOSE REDUCTION: This exam was performed according to the departmental dose-optimization program which includes automated exposure control, adjustment of the mA and/or kV according to patient size and/or use of iterative reconstruction technique. CONTRAST:  OMNIPAQUE  IOHEXOL  300 MG/ML SOLN additional oral enteric contrast COMPARISON:  03/14/2023 FINDINGS: CT CHEST FINDINGS Cardiovascular: Right chest port catheter. Aortic atherosclerosis. Normal heart size. Left coronary artery calcifications. No pericardial effusion. Mediastinum/Nodes: No enlarged  mediastinal, hilar, or axillary lymph nodes. Small hiatal hernia. Thyroid  gland, trachea, and esophagus demonstrate no significant findings. Lungs/Pleura: Severe emphysema. Diffuse bilateral bronchial wall thickening. Extensive new irregular nodularity and consolidation throughout the lower right lung, particularly in the dependent right lower lobe (series 3, image 104). No pleural effusion or pneumothorax. Incidental note of prominent azygous fissure. Musculoskeletal: No chest wall abnormality. No acute osseous findings. CT ABDOMEN PELVIS FINDINGS Hepatobiliary: No solid liver abnormality is seen. No gallstones, gallbladder wall thickening, or biliary dilatation. Pancreas: Unremarkable. No pancreatic ductal dilatation or surrounding inflammatory changes. Spleen: Normal in size without significant abnormality. Adrenals/Urinary Tract: Adrenal glands are unremarkable. Kidneys are normal, without renal calculi, solid lesion, or hydronephrosis. Severely thickened urinary bladder, decompressed by Foley catheter. Stomach/Bowel: Stomach is within normal limits. Appendix appears normal. Severe sigmoid diverticulosis. Treated rectal mass (series 2, image 115). Vascular/Lymphatic: Severe aortic atherosclerosis. No enlarged abdominal or pelvic lymph nodes. Reproductive: Prostatomegaly with Urolift implants. Other: Small fat containing umbilical hernia.  No ascites. Musculoskeletal: No acute osseous findings. IMPRESSION: 1. Treated rectal mass. MRI is the modality of choice for local staging. 2. No evidence of lymphadenopathy or metastatic disease in the chest, abdomen, or pelvis. 3. Extensive new irregular nodularity and consolidation throughout the lower right lung, particularly in the dependent right lower lobe, morphology and distribution strongly favoring infection or inflammation. 4. Severe emphysema and diffuse bilateral bronchial wall thickening. 5. Severely thickened urinary bladder, decompressed by Foley catheter.  This may be related to chronic outlet obstruction in the setting of  prostatomegaly. Correlate with urinalysis to exclude cystitis. 6. Coronary artery disease. Aortic Atherosclerosis (ICD10-I70.0) and Emphysema (ICD10-J43.9). Electronically Signed   By: Marolyn JONETTA Jaksch M.D.   On: 08/14/2023 07:05

## 2023-08-21 ENCOUNTER — Other Ambulatory Visit: Payer: Self-pay

## 2023-08-22 ENCOUNTER — Telehealth: Payer: Self-pay | Admitting: Urology

## 2023-08-22 NOTE — Telephone Encounter (Signed)
 PATIENT SON CALLED - THEY RIPPED A HOLE IN THE CATH BAG. THEY ARE GOING TO SWING BY OFFICE IN ABOUT 10 MINUTES TO PICK UP SMALL BAG.

## 2023-08-22 NOTE — Telephone Encounter (Signed)
 Pt son given leg bag and anchor

## 2023-08-24 ENCOUNTER — Encounter: Payer: Self-pay | Admitting: Urology

## 2023-08-24 ENCOUNTER — Ambulatory Visit: Admitting: Urology

## 2023-08-24 VITALS — BP 120/69 | HR 92

## 2023-08-24 DIAGNOSIS — R339 Retention of urine, unspecified: Secondary | ICD-10-CM

## 2023-08-24 DIAGNOSIS — N3289 Other specified disorders of bladder: Secondary | ICD-10-CM

## 2023-08-24 DIAGNOSIS — N138 Other obstructive and reflux uropathy: Secondary | ICD-10-CM

## 2023-08-24 DIAGNOSIS — R338 Other retention of urine: Secondary | ICD-10-CM

## 2023-08-24 DIAGNOSIS — N401 Enlarged prostate with lower urinary tract symptoms: Secondary | ICD-10-CM | POA: Diagnosis not present

## 2023-08-24 MED ORDER — CIPROFLOXACIN HCL 500 MG PO TABS
500.0000 mg | ORAL_TABLET | Freq: Once | ORAL | Status: AC
  Administered 2023-08-24: 500 mg via ORAL

## 2023-08-24 MED ORDER — FESOTERODINE FUMARATE ER 8 MG PO TB24: 8.0000 mg | ORAL_TABLET | Freq: Every day | ORAL | 11 refills | Status: DC

## 2023-08-24 NOTE — Progress Notes (Signed)
 Cath Change/ Replacement  Patient is present today for a catheter change due to urinary retention.  10ml of water  was removed from the balloon, a 16FR foley cath was removed without difficulty.  Patient was cleaned and prepped in a sterile fashion with Betadinex3.  A 16 FR foley cath was replaced into the bladder, no complications were noted. Urine return was noted 35ml and urine was orange in color. The balloon was filled with 10ml of sterile water . A leg bag was attached for drainage.  A night bag was also given to the patient and patient was given instruction on how to change from one bag to another. Patient was given proper instruction on catheter care.    Performed by: Exie DASEN. CMA  Follow up: 4 weeks

## 2023-08-24 NOTE — Progress Notes (Signed)
 08/24/2023 9:09 AM   James Miles [birth year] [id number]  Referring provider: [name], NP [address] [place],  [place] [address]  Urinary retention  HPI: James Miles is a 78yo here for followup for BPH with urinary retention. He failed Urolift. He is currently managed with an indwelling foley. His biggest issue with painful bladder spasms. No UTIs since last    PMH: Past Medical History:  Diagnosis Date   Back pain    COPD (chronic obstructive pulmonary disease) (HCC)    Diabetes mellitus without complication (HCC)    Dyspnea    Dysrhythmia    Hyperlipidemia    Oxygen dependent    4L when needed    Surgical History: Past Surgical History:  Procedure Laterality Date   A-FLUTTER ABLATION N/A 08/23/2022   Procedure: A-FLUTTER ABLATION;  Surgeon: [name], MD;  Location: [facility];  Service: Cardiovascular;  Laterality: N/A;   BIOPSY  04/26/2022   Procedure: BIOPSY;  Surgeon: Shaaron Lamar HERO, MD;  Location: [facility];  Service: Endoscopy;;   BLADDER INSTILLATION N/A 11/16/2022   Procedure: BLADDER INSTILLATION- Gemcitibine;  Surgeon: Sherrilee Belvie CROME, MD;  Location: [facility];  Service: Urology;  Laterality: N/A;   COLONOSCOPY WITH PROPOFOL  N/A 04/26/2022   Procedure: COLONOSCOPY WITH PROPOFOL ;  Surgeon: Shaaron Lamar HERO, MD;  Location: [facility];  Service: Endoscopy;  Laterality: N/A;  10:00am; ASA 3   CYSTOSCOPY N/A 11/16/2022   Procedure: CYSTOSCOPY;  Surgeon: Sherrilee Belvie CROME, MD;  Location: [facility];  Service: Urology;  Laterality: N/A;   CYSTOSCOPY WITH INSERTION OF UROLIFT N/A 05/28/2023   Procedure: CYSTOSCOPY WITH INSERTION OF UROLIFT;  Surgeon: Sherrilee Belvie CROME, MD;  Location: [facility];  Service: Urology;  Laterality: N/A;   FLEXIBLE SIGMOIDOSCOPY N/A 07/16/2023   Procedure: KINGSTON SIDE;  Surgeon: Shaaron Lamar HERO, MD;  Location: [facility];  Service: Endoscopy;  Laterality: N/A;  230pm, ok rm 1/2    hemorhoidectomy     IR IMAGING GUIDED PORT INSERTION  05/12/2022   POLYPECTOMY  04/26/2022   Procedure: POLYPECTOMY;  Surgeon: Shaaron Lamar HERO, MD;  [facility];  Service: Endoscopy;;   TRANSURETHRAL RESECTION OF BLADDER TUMOR N/A 11/16/2022   Procedure: TRANSURETHRAL RESECTION OF BLADDER TUMOR (TURBT);  Surgeon: Sherrilee Belvie CROME, MD;  Location: [facility];  Service: Urology;  Laterality: N/A;    Home Medications:  Allergies as of 08/24/2023   No Known Allergies      Medication List        Accurate as of August 24, 2023  9:09 AM. If you have any questions, ask your nurse or doctor.          albuterol  108 (90 Base) MCG/ACT inhaler Commonly known as: VENTOLIN  HFA Inhale 2 puffs into the lungs every 6 (six) hours as needed for wheezing or shortness of breath.   calcium  carbonate 500 MG chewable tablet Commonly known as: TUMS - dosed in mg elemental calcium  Chew 1 tablet by mouth daily as needed for indigestion or heartburn.   febuxostat  40 MG tablet Commonly known as: ULORIC  Take 1 tablet (40 mg total) by mouth daily. What changed:  when to take this reasons to take this   finasteride  5 MG tablet Commonly known as: PROSCAR  Take 1 tablet (5 mg total) by mouth daily.   furosemide  20 MG tablet Commonly known as: LASIX  Take 1 tablet (20 mg total) by mouth daily as needed.   GaviLyte-G  236 g solution Generic drug: polyethylene glycol Take 4,000 mLs by mouth once.   HYDROcodone -acetaminophen  5-325 MG tablet Commonly known as: NORCO/VICODIN Take 1 tablet by mouth every 6 (six) hours as needed for moderate pain (pain score 4-6).   levofloxacin  500 MG tablet Commonly known as: LEVAQUIN  Take 1 tablet (500 mg total) by mouth daily.   lidocaine -prilocaine  cream Commonly known as: EMLA  Apply 1 Application topically.   loperamide  2 MG capsule Commonly known as: IMODIUM  Take 2 mg by mouth 3 (three) times daily as needed for diarrhea or loose stools.    MELATONIN PO Take 1 tablet by mouth at bedtime as needed (sleep).   mirabegron  ER 25 MG Tb24 tablet Commonly known as: MYRBETRIQ  Take 1 tablet (25 mg total) by mouth daily.   Misc. Devices Misc Please provide with shower chair   MUCINEX FAST-MAX CONG HEADACHE PO Take 1-2 tablets by mouth daily as needed (congestion).   naproxen sodium 220 MG tablet Commonly known as: ALEVE Take 220 mg by mouth daily as needed (pain).   prochlorperazine  10 MG tablet Commonly known as: COMPAZINE  Take 10 mg by mouth every 6 (six) hours as needed for nausea or vomiting.   silodosin  8 MG Caps capsule Commonly known as: RAPAFLO  Take 1 capsule (8 mg total) by mouth daily with breakfast.        Allergies: No Known Allergies  Family History: No family history on file.  Social History:  reports that he quit smoking about 7 years ago. His smoking use included cigarettes. He started smoking about 57 years ago. He has never used smokeless tobacco. He reports that he does not drink alcohol and does not use drugs.  ROS: All other review of systems were reviewed and are negative except what is noted above in HPI  Physical Exam: BP 120/69   Pulse 92   Constitutional:  Alert and oriented, No acute distress. HEENT: [place] AT, moist mucus membranes.  Trachea midline, no masses. Cardiovascular: No clubbing, cyanosis, or edema. Respiratory: Normal respiratory effort, no increased work of breathing. GI: Abdomen is soft, nontender, nondistended, no abdominal masses GU: No CVA tenderness.  Lymph: No cervical or inguinal lymphadenopathy. Skin: No rashes, bruises or suspicious lesions. Neurologic: Grossly intact, no focal deficits, moving all 4 extremities. Psychiatric: Normal mood and affect.  Laboratory Data: Lab Results  Component Value Date   WBC 13.2 (H) 07/31/2023   HGB 10.6 (L) 07/31/2023   HCT 35.1 (L) 07/31/2023   MCV 92.9 07/31/2023   PLT 272 07/31/2023    Lab Results  Component Value  Date   CREATININE 0.96 07/31/2023    Lab Results  Component Value Date   PSA 3.1 10/03/2018   PSA 1.7 10/03/2017    No results found for: TESTOSTERONE  Lab Results  Component Value Date   HGBA1C 7.8 (H) 07/08/2022    Urinalysis    Component Value Date/Time   COLORURINE YELLOW 02/19/2023 1701   APPEARANCEUR CLOUDY (A) 02/19/2023 1701   APPEARANCEUR Cloudy (A) 10/18/2022 1513   LABSPEC 1.009 02/19/2023 1701   PHURINE 7.0 02/19/2023 1701   GLUCOSEU 150 (A) 02/19/2023 1701   HGBUR LARGE (A) 02/19/2023 1701   BILIRUBINUR NEGATIVE 02/19/2023 1701   BILIRUBINUR Negative 10/18/2022 1513   KETONESUR NEGATIVE 02/19/2023 1701   PROTEINUR 100 (A) 02/19/2023 1701   NITRITE NEGATIVE 02/19/2023 1701   LEUKOCYTESUR LARGE (A) 02/19/2023 1701    Lab Results  Component Value Date   LABMICR See below: 10/18/2022   WBCUA 11-30 (  A) 10/18/2022   LABEPIT 0-10 10/18/2022   BACTERIA RARE (A) 02/19/2023    Pertinent Imaging:  No results found for this or any previous visit.  Results for orders placed during the hospital encounter of 10/07/18  US  Venous Img Lower Bilateral  Narrative CLINICAL DATA:  Bilateral lower extremity swelling.  EXAM: BILATERAL LOWER EXTREMITY VENOUS DOPPLER ULTRASOUND  TECHNIQUE: Gray-scale sonography with graded compression, as well as color Doppler and duplex ultrasound were performed to evaluate the lower extremity deep venous systems from the level of the common femoral vein and including the common femoral, femoral, profunda femoral, popliteal and calf veins including the posterior tibial, peroneal and gastrocnemius veins when visible. The superficial great saphenous vein was also interrogated. Spectral Doppler was utilized to evaluate flow at rest and with distal augmentation maneuvers in the common femoral, femoral and popliteal veins.  COMPARISON:  None.  FINDINGS: RIGHT LOWER EXTREMITY  Common Femoral Vein: No evidence of thrombus.  Normal compressibility, respiratory phasicity and response to augmentation.  Saphenofemoral Junction: No evidence of thrombus. Normal compressibility and flow on color Doppler imaging.  Profunda Femoral Vein: No evidence of thrombus. Normal compressibility and flow on color Doppler imaging.  Femoral Vein: No evidence of thrombus. Normal compressibility, respiratory phasicity and response to augmentation.  Popliteal Vein: No evidence of thrombus. Normal compressibility, respiratory phasicity and response to augmentation.  Calf Veins: No evidence of thrombus. Normal compressibility and flow on color Doppler imaging.  Superficial Great Saphenous Vein: No evidence of thrombus. Normal compressibility.  Other Findings:  None.  LEFT LOWER EXTREMITY  Common Femoral Vein: No evidence of thrombus. Normal compressibility, respiratory phasicity and response to augmentation.  Saphenofemoral Junction: No evidence of thrombus. Normal compressibility and flow on color Doppler imaging.  Profunda Femoral Vein: No evidence of thrombus. Normal compressibility and flow on color Doppler imaging.  Femoral Vein: No evidence of thrombus. Normal compressibility, respiratory phasicity and response to augmentation.  Popliteal Vein: No evidence of thrombus. Normal compressibility, respiratory phasicity and response to augmentation.  Calf Veins: No evidence of thrombus. Normal compressibility and flow on color Doppler imaging.  Other Findings: None.  IMPRESSION: No evidence of deep venous thrombosis in either lower extremity.   Electronically Signed By: Debby  Register On: 10/07/2018 14:00  No results found for this or any previous visit.  No results found for this or any previous visit.  Results for orders placed during the hospital encounter of 02/19/23  US  RENAL  Narrative CLINICAL DATA:  Acute kidney injury.  EXAM: RENAL / URINARY TRACT ULTRASOUND COMPLETE  COMPARISON:  CT  scan 05/09/2022.  FINDINGS: Right Kidney:  Renal measurements: 12.4 x 6.0 x 5.9 cm = volume: 229 mL. Moderate right renal collecting system dilatation. No perinephric fluid. Preserved parenchyma.  Left Kidney:  Renal measurements: 12.6 x 6.2 x 5.2 cm = volume: 214.5 mL. Moderate collecting system dilatation. No perinephric fluid. Preserved parenchyma.  Bladder:  Distended bladder with significant wall thickening measuring up to 11 mm. Prevoid volume of 294 cc.  Other:  Evaluation somewhat limited by patient has been difficulty with procedure as per the sonographer.  IMPRESSION: Distended urinary bladder with a significant wall thickening. There is also moderate bilateral renal collecting system dilatation. Would recommend repeat study after decompression of the bladder to see if this persists or additional cross-sectional imaging workup with a CT when clinically appropriate   Electronically Signed By: Ranell Bring M.D. On: 02/20/2023 11:04  No results found for this or any previous visit.  Results for orders placed during the hospital encounter of 05/09/22  CT HEMATURIA WORKUP  Narrative CLINICAL DATA:  Hematuria. Bladder mass on recent MRI. Rectal carcinoma. * Tracking Code: BO *  EXAM: CT ABDOMEN AND PELVIS WITHOUT AND WITH CONTRAST  TECHNIQUE: Multidetector CT imaging of the abdomen and pelvis was performed following the standard protocol before and following the bolus administration of intravenous contrast.  RADIATION DOSE REDUCTION: This exam was performed according to the departmental dose-optimization program which includes automated exposure control, adjustment of the mA and/or kV according to patient size and/or use of iterative reconstruction technique.  CONTRAST:  125mL OMNIPAQUE  IOHEXOL  300 MG/ML  SOLN  COMPARISON:  05/01/2022  FINDINGS: Lower Chest: No acute findings.  Hepatobiliary: No hepatic masses identified. Gallbladder  is unremarkable. No evidence of biliary ductal dilatation.  Pancreas:  No mass or inflammatory changes.  Spleen: Within normal limits in size and appearance.  Adrenals/Urinary Tract: No adrenal masses identified. Few tiny 1-2 mm calculi are noted in both kidneys. No evidence of ureteral calculi or hydronephrosis. No suspicious renal masses identified. No masses seen involving the collecting systems or ureters. Mild diffuse bladder wall thickening and trabeculation is seen which may be due to chronic bladder outlet obstruction or cystitis. A focal enhancing soft tissue nodule measuring approximately 1.2 cm is seen along the left lateral bladder wall, suspicious for bladder carcinoma.  Stomach/Bowel: Concentric rectal wall thickening and enhancement is seen, consistent with known primary rectal carcinoma. No evidence of obstruction, inflammatory process or abnormal fluid collections. Normal appendix visualized. Diverticulosis is seen mainly involving the descending and sigmoid colon, however there is no evidence of diverticulitis.  Vascular/Lymphatic: No pathologically enlarged lymph nodes. No acute vascular findings. Aortic atherosclerotic calcification incidentally noted. 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  Reproductive:  No mass or other significant abnormality.  Other:  None.  Musculoskeletal:  No suspicious bone lesions identified.  IMPRESSION: 1.2 cm focal enhancing soft tissue nodule along the left lateral bladder wall, suspicious for bladder carcinoma.  Low rectal soft tissue mass, consistent with known primary rectal carcinoma.  No evidence of metastatic disease within the abdomen or pelvis.  Mild diffuse bladder wall thickening and trabeculation, which may be due to chronic bladder outlet obstruction or cystitis.  Tiny nonobstructing bilateral renal calculi.  Colonic diverticulosis, without radiographic  evidence of diverticulitis.  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  Aortic Atherosclerosis (ICD10-I70.0).   Electronically Signed By: Norleen DELENA Kil M.D. On: 05/09/2022 10:58  No results found for this or any previous visit.   Assessment & Plan:    1. Urinary retention (Primary) -continue indwelling foley - INSERT,TEMP INDWELLING BLAD CATH,SIMPLE - ciprofloxacin  (CIPRO ) tablet 500 mg  2. Benign prostatic hyperplasia with urinary obstruction Continue indwelling foley  3. Bladder spasms -we will trial toviaz 8mg  daily   No follow-ups on file.  Belvie Clara, MD  [facility] [place]

## 2023-08-24 NOTE — Patient Instructions (Signed)
 Trouble Peeing (Acute Urinary Retention) in Males: What to Know Acute urinary retention is when a person can't pee at all or can only pee a little. This can come on all of a sudden. If it's not treated, it can lead to kidney problems or other serious problems. What are the causes? Acute urinary retention may be caused by: A problem with the urethra. This is the tube that drains pee from the bladder. Problems with the nerves in the bladder. Tumors. Some medicines. An infection. Having trouble pooping (constipation). What increases the risk? Older males are more at risk because their prostate gland may get larger as they age. Other health problems can also raise the risk. These include: Multiple sclerosis. Injury to the spinal cord. Diabetes. A condition that affects the way the brain works, such as dementia. Mental health problems. Having urinary retention before. What are the signs or symptoms? Trouble peeing. Pain in the lower belly. How is this treated? Treatment may include: Medicines. Treatment for problems that may cause this. Placing a soft tube called a catheter into the bladder to drain pee out of the body. Therapy to treat mental health problems. If needed, you may be treated in the hospital for kidney problems. Follow these instructions at home: Medicines Take medicines only as told. Do not take any medicine unless your doctor says it's OK. If you were given antibiotics, take them as told. Do not stop taking them even if you start to feel better. General instructions Do not smoke, vape, or use nicotine or tobacco. Drink more fluids as told. If you need to use a catheter, follow the instructions closely. This will help prevent a bladder infection. If told, keep track of changes in your blood pressure at home. Tell your doctor about them. Contact a doctor if: You have bladder cramping, called spasms. You leak pee when you have spasms. You have a fever or chills. You  have blood in your pee. Get help right away if: You can't pee. This information is not intended to replace advice given to you by your health care provider. Make sure you discuss any questions you have with your health care provider. Document Revised: 09/21/2022 Document Reviewed: 09/21/2022 Elsevier Patient Education  2024 [employer].

## 2023-08-28 ENCOUNTER — Telehealth: Payer: Self-pay

## 2023-08-28 NOTE — Telephone Encounter (Signed)
 Medication prior authorization request received.  Completed PA request through cover my meds for drug Toviaz . KEY: [id number]  Approved: Pending

## 2023-09-24 ENCOUNTER — Ambulatory Visit

## 2023-09-25 ENCOUNTER — Ambulatory Visit: Admitting: Urology

## 2023-09-26 ENCOUNTER — Ambulatory Visit (INDEPENDENT_AMBULATORY_CARE_PROVIDER_SITE_OTHER)

## 2023-09-26 DIAGNOSIS — R339 Retention of urine, unspecified: Secondary | ICD-10-CM

## 2023-09-26 NOTE — Progress Notes (Signed)
 Cath Change/ Replacement  Patient is present today for a catheter change due to urinary retention.  10 ml of water  was removed from the balloon, a 16 FR foley cath was removed without difficulty.  Patient was cleaned and prepped in a sterile fashion with Betadinex3.  A 16  FR foley cath was replaced into the bladder, no complications were noted. Urine return was noted 20 ml and urine was Pink in color. Patient was flush with sterile water  until urine was light pink. The balloon was filled with 10ml of sterile water . A night bag was attached for drainage.  A night bag was also given to the patient and patient was given instruction on how to change from one bag to another. Patient was given proper instruction on catheter care.    Performed by: Carlos, CMA  Follow up: Keep monthly cath appointment

## 2023-10-11 NOTE — Progress Notes (Signed)
 Cardiology Office Note:  .   Date:  10/18/2023  ID:  James Miles, DOB 03-05-1945, MRN [id number] PCP: [name], NP  [facility] Providers Cardiologist:  Maude Emmer, MD Electrophysiologist:  Danelle Birmingham, MD    Patient Profile: .      PMH Atrial flutter on chronic anticoagulation S/p catheter ablation Type 2 diabetes mellitus COPD on home oxygen Former tobacco abuse Quit smoking in 2021 NICM TTE 07/08/2022 EF 40-45 Likely tachycardia mediated TTE 10/25/2022 EF 60-65, G1DD Borderline dilatation of aortic root 37 mm Rectal adenocarcinoma  A Fib clinic 07/24/2022 following admission for atrial flutter. His HR was noted to be elevated in 150s in cancer center, thought to be SVT and given adenosine  and later noted to be atrial flutter. He was discharged on amiodarone  and Eliquis  5 mg twice daily for stroke prevention. He is undergoing chemotherapy for rectal adenocarcinoma.  Admission 6/26-6/28/24 for atrial flutter RVR. Recent admission to [facility] for similar complaints. He presented from cancer center with HR in the 140s. Initially hypotensive on amiodarone , BP improved with the slowing of HR. He was planning for EP study and a flutter ablation. He was switched from Eliquis  to Xarelto  for once daily dosing.  He underwent EPS/RFCA with Dr. Birmingham on 08/23/22. He had frequent PVCs on telemetry following procedure.   Last cardiology clinic visit was 09/21/2022 with Dr. Birmingham. He was maintaining NSR at that appointment and amiodarone  was stopped. He planned to d/c OAC at the end of the current Rx.   Main issue is rectal/bladder cancer some dyspnea from COPD On oxygen. Has had foley in since getting bladder chemo a year ago.   Widowed around 11 years. Son lives with him and seems to have good relationship with patient        ROS: See HPI       Studies Reviewed: .       Echo: 10/25/22  IMPRESSIONS     1. Left ventricular ejection fraction,  by estimation, is 60 to 65%. The  left ventricle has normal function. The left ventricle has no regional  wall motion abnormalities. Left ventricular diastolic parameters are  consistent with Grade I diastolic  dysfunction (impaired relaxation).   2. Right ventricular systolic function is normal. The right ventricular  size is normal. Tricuspid regurgitation signal is inadequate for assessing  PA pressure.   3. The mitral valve is normal in structure. Trivial mitral valve  regurgitation. No evidence of mitral stenosis.   4. The aortic valve has an indeterminant number of cusps. Aortic valve  regurgitation is not visualized. No aortic stenosis is present.   5. Aortic dilatation noted. There is borderline dilatation of the aortic  root, measuring 37 mm.   6. The inferior vena cava is dilated in size with >50% respiratory  variability, suggesting right atrial pressure of 8 mmHg.   Comparison(s): Changes from prior study are noted. LVEF improved from  40-45% to 60-65% now.         Physical Exam:   VS:  BP (!) 114/52   Pulse 83   Wt 165 lb 9.6 oz (75.1 kg)   SpO2 95%   BMI 23.10 kg/m    Wt Readings from Last 3 Encounters:  10/18/23 165 lb 9.6 oz (75.1 kg)  07/16/23 155 lb (70.3 kg)  05/28/23 150 lb 6.4 oz (68.2 kg)    Affect appropriate Chronically ill male  HEENT: normal Neck supple with no adenopathy JVP normal no bruits no  thyromegaly Lungs COPD oxygen, exp wheezes mild  Heart:  S1/S2 no murmur, no rub, gallop or click PMI normal Abdomen: benighn, BS positve, no tenderness, no AAA no bruit.  No HSM or HJR Distal pulses intact with no bruits  Foley catheter in place  No edema Neuro non-focal Skin warm and dry No muscular weakness     ASSESSMENT AND PLAN: .     Atrial flutter s/p ablation: He remains in sinus rhythm today with well controlled HR.  He is no longer on oral anticoagulation.  No acute concerns today.   Frequent PVCs: EKG reveals normal sinus rhythm at  69 bpm with frequent PVCs. He denies palpitations, is completely asymptomatic. No longer on AV nodal blocking agents. HR is well controlled.   COPD:  COPD contributes to exertional dyspnea, but there are no acute exacerbations. He denies orthopnea or PND. Requests recommendations for a new PCP.   NICM/Peripheral edema: Experiences bilateral feet swelling which is not evident on exam today. Likely secondary to sedentary lifestyle and diet. DOE with moderate exertion, no orthopnea or PND. Normal LVEF, G1 DD, normal RV, no significant hypertrophy on echo 10/25/2022.        Disposition: F/U in a year  Signed, Maude Emmer MD [facility]

## 2023-10-18 ENCOUNTER — Ambulatory Visit: Attending: Cardiovascular Disease | Admitting: Cardiovascular Disease

## 2023-10-18 VITALS — BP 114/52 | HR 83 | Wt 165.6 lb

## 2023-10-18 DIAGNOSIS — I493 Ventricular premature depolarization: Secondary | ICD-10-CM

## 2023-10-18 DIAGNOSIS — R600 Localized edema: Secondary | ICD-10-CM | POA: Diagnosis not present

## 2023-10-18 DIAGNOSIS — I483 Typical atrial flutter: Secondary | ICD-10-CM | POA: Diagnosis not present

## 2023-10-18 NOTE — Patient Instructions (Signed)
Medication Instructions:  Your physician recommends that you continue on your current medications as directed. Please refer to the Current Medication list given to you today.   Labwork: None today  Testing/Procedures: None today  Follow-Up: 1 year Dr.Nishan  Any Other Special Instructions Will Be Listed Below (If Applicable).  If you need a refill on your cardiac medications before your next appointment, please call your pharmacy.  

## 2023-10-29 ENCOUNTER — Ambulatory Visit (INDEPENDENT_AMBULATORY_CARE_PROVIDER_SITE_OTHER): Admitting: Urology

## 2023-10-29 ENCOUNTER — Telehealth: Payer: Self-pay

## 2023-10-29 DIAGNOSIS — N3281 Overactive bladder: Secondary | ICD-10-CM

## 2023-10-29 DIAGNOSIS — N390 Urinary tract infection, site not specified: Secondary | ICD-10-CM

## 2023-10-29 DIAGNOSIS — R339 Retention of urine, unspecified: Secondary | ICD-10-CM

## 2023-10-29 NOTE — Telephone Encounter (Signed)
 Patient presents today with complaints of  burning.  UA and Culture done today.  Dr. Sherrilee reviewed results and awaiting urine culture results .  Patient aware of MD recommendations and that we will reach out with culture results.      [email or web address], CMA

## 2023-10-29 NOTE — Progress Notes (Unsigned)
 Cath Change/ Replacement  Patient is present today for a catheter change due to urinary retention.  10 ml of water  was removed from the balloon, a 16 FR foley cath was removed without difficulty.  Patient was cleaned and prepped in a sterile fashion with Betadinex3.  A 16  FR foley cath was replaced into the bladder, no complications were noted. Urine return was noted 5 ml and urine was Amber in color. The balloon was filled with 10ml of sterile water . A leg bag was attached for drainage.  A night bag was also given to the patient and patient was given instruction on how to change from one bag to another. Patient was given proper instruction on catheter care.    Performed by: Carlos, CMA  Follow up: Keep monthly Cath Change

## 2023-10-30 ENCOUNTER — Encounter: Payer: Self-pay | Admitting: Urology

## 2023-10-30 ENCOUNTER — Other Ambulatory Visit: Payer: Self-pay

## 2023-10-30 LAB — URINALYSIS, ROUTINE W REFLEX MICROSCOPIC
Bilirubin, UA: NEGATIVE
Glucose, UA: NEGATIVE
Ketones, UA: NEGATIVE
Nitrite, UA: POSITIVE — AB
Specific Gravity, UA: 1.03 (ref 1.005–1.030)
Urobilinogen, Ur: 1 mg/dL (ref 0.2–1.0)
pH, UA: 6 (ref 5.0–7.5)

## 2023-10-30 LAB — MICROSCOPIC EXAMINATION
RBC, Urine: >30 /HPF — AB (ref 0–2)
WBC, UA: >30 /HPF — AB (ref 0–5)

## 2023-10-30 NOTE — Progress Notes (Signed)
 10/29/2023 10:17 AM   James Miles December 25, 1945 [id number]  Referring provider: [name], NP [address] [place],  [place] [id number]  Urinary retention   HPI: [name] is a 78yo here for followup fro urinary retention and bladder spasms. Toviaz  improved his bladder spasms but he had dry mouth and constipation. His urinary retention is currently managed with an indwelling foley and patient wishes to pursue an SP tube   PMH: Past Medical History:  Diagnosis Date   Back pain    COPD (chronic obstructive pulmonary disease) (HCC)    Diabetes mellitus without complication (HCC)    Dyspnea    Dysrhythmia    Hyperlipidemia    Oxygen dependent    4L when needed    Surgical History: Past Surgical History:  Procedure Laterality Date   A-FLUTTER ABLATION N/A 08/23/2022   Procedure: A-FLUTTER ABLATION;  Surgeon: [name], MD;  Location: [facility];  Service: Cardiovascular;  Laterality: N/A;   BIOPSY  04/26/2022   Procedure: BIOPSY;  Surgeon: Shaaron Lamar HERO, MD;  Location: [facility];  Service: Endoscopy;;   BLADDER INSTILLATION N/A 11/16/2022   Procedure: BLADDER INSTILLATION- Gemcitibine;  Surgeon: Sherrilee Belvie CROME, MD;  Location: [facility];  Service: Urology;  Laterality: N/A;   COLONOSCOPY WITH PROPOFOL  N/A 04/26/2022   Procedure: COLONOSCOPY WITH PROPOFOL ;  Surgeon: Shaaron Lamar HERO, MD;  Location: [facility];  Service: Endoscopy;  Laterality: N/A;  10:00am; ASA 3   CYSTOSCOPY N/A 11/16/2022   Procedure: CYSTOSCOPY;  Surgeon: Sherrilee Belvie CROME, MD;  Location: [facility];  Service: Urology;  Laterality: N/A;   CYSTOSCOPY WITH INSERTION OF UROLIFT N/A 05/28/2023   Procedure: CYSTOSCOPY WITH INSERTION OF UROLIFT;  Surgeon: Sherrilee Belvie CROME, MD;  Location: [facility];  Service: Urology;  Laterality: N/A;   FLEXIBLE SIGMOIDOSCOPY N/A 07/16/2023   Procedure: KINGSTON SIDE;  Surgeon: Shaaron Lamar HERO, MD;  Location: [facility];   Service: Endoscopy;  Laterality: N/A;  230pm, ok rm 1/2   hemorhoidectomy     IR IMAGING GUIDED PORT INSERTION  05/12/2022   POLYPECTOMY  04/26/2022   Procedure: POLYPECTOMY;  Surgeon: Shaaron Lamar HERO, MD;  Location: [facility];  Service: Endoscopy;;   TRANSURETHRAL RESECTION OF BLADDER TUMOR N/A 11/16/2022   Procedure: TRANSURETHRAL RESECTION OF BLADDER TUMOR (TURBT);  Surgeon: Sherrilee Belvie CROME, MD;  Location: [facility];  Service: Urology;  Laterality: N/A;    Home Medications:  Allergies as of 10/29/2023   No Known Allergies      Medication List        Accurate as of October 29, 2023 11:59 PM. If you have any questions, ask your nurse or doctor.          albuterol  108 (90 Base) MCG/ACT inhaler Commonly known as: VENTOLIN  HFA Inhale 2 puffs into the lungs every 6 (six) hours as needed for wheezing or shortness of breath.   calcium  carbonate 500 MG chewable tablet Commonly known as: TUMS - dosed in mg elemental calcium  Chew 1 tablet by mouth daily as needed for indigestion or heartburn.   febuxostat  40 MG tablet Commonly known as: ULORIC  Take 1 tablet (40 mg total) by mouth daily. What changed:  when to take this reasons to take this   fesoterodine  8 MG Tb24 tablet Commonly known as: TOVIAZ  Take 1 tablet (8 mg total) by mouth daily.   finasteride  5 MG tablet Commonly known as: PROSCAR  Take 1 tablet (5 mg  total) by mouth daily.   furosemide  20 MG tablet Commonly known as: LASIX  Take 1 tablet (20 mg total) by mouth daily as needed.   GaviLyte-G 236 g solution Generic drug: polyethylene glycol Take 4,000 mLs by mouth once.   HYDROcodone -acetaminophen  5-325 MG tablet Commonly known as: NORCO/VICODIN Take 1 tablet by mouth every 6 (six) hours as needed for moderate pain (pain score 4-6).   levofloxacin  500 MG tablet Commonly known as: LEVAQUIN  Take 1 tablet (500 mg total) by mouth daily.   lidocaine -prilocaine  cream Commonly known as: EMLA  Apply 1  Application topically.   loperamide  2 MG capsule Commonly known as: IMODIUM  Take 2 mg by mouth 3 (three) times daily as needed for diarrhea or loose stools.   MELATONIN PO Take 1 tablet by mouth at bedtime as needed (sleep).   mirabegron  ER 25 MG Tb24 tablet Commonly known as: MYRBETRIQ  Take 1 tablet (25 mg total) by mouth daily.   Misc. Devices Misc Please provide with shower chair   MUCINEX FAST-MAX CONG HEADACHE PO Take 1-2 tablets by mouth daily as needed (congestion).   naproxen sodium 220 MG tablet Commonly known as: ALEVE Take 220 mg by mouth daily as needed (pain).   prochlorperazine  10 MG tablet Commonly known as: COMPAZINE  Take 10 mg by mouth every 6 (six) hours as needed for nausea or vomiting.   silodosin  8 MG Caps capsule Commonly known as: RAPAFLO  Take 1 capsule (8 mg total) by mouth daily with breakfast.        Allergies: No Known Allergies  Family History: No family history on file.  Social History:  reports that he quit smoking about 7 years ago. His smoking use included cigarettes. He started smoking about 57 years ago. He has never used smokeless tobacco. He reports that he does not drink alcohol and does not use drugs.  ROS: All other review of systems were reviewed and are negative except what is noted above in HPI  Physical Exam: There were no vitals taken for this visit.  Constitutional:  Alert and oriented, No acute distress. HEENT: [place] AT, moist mucus membranes.  Trachea midline, no masses. Cardiovascular: No clubbing, cyanosis, or edema. Respiratory: Normal respiratory effort, no increased work of breathing. GI: Abdomen is soft, nontender, nondistended, no abdominal masses GU: No CVA tenderness.  Lymph: No cervical or inguinal lymphadenopathy. Skin: No rashes, bruises or suspicious lesions. Neurologic: Grossly intact, no focal deficits, moving all 4 extremities. Psychiatric: Normal mood and affect.  Laboratory Data: Lab Results   Component Value Date   WBC 13.2 (H) 07/31/2023   HGB 10.6 (L) 07/31/2023   HCT 35.1 (L) 07/31/2023   MCV 92.9 07/31/2023   PLT 272 07/31/2023    Lab Results  Component Value Date   CREATININE 0.96 07/31/2023    Lab Results  Component Value Date   PSA 3.1 10/03/2018   PSA 1.7 10/03/2017    No results found for: TESTOSTERONE  Lab Results  Component Value Date   HGBA1C 7.8 (H) 07/08/2022    Urinalysis    Component Value Date/Time   COLORURINE YELLOW 02/19/2023 1701   APPEARANCEUR Clear 10/29/2023 1524   LABSPEC 1.009 02/19/2023 1701   PHURINE 7.0 02/19/2023 1701   GLUCOSEU Negative 10/29/2023 1524   HGBUR LARGE (A) 02/19/2023 1701   BILIRUBINUR Negative 10/29/2023 1524   KETONESUR NEGATIVE 02/19/2023 1701   PROTEINUR 3+ (A) 10/29/2023 1524   PROTEINUR 100 (A) 02/19/2023 1701   NITRITE Positive (A) 10/29/2023 1524   NITRITE NEGATIVE  02/19/2023 1701   LEUKOCYTESUR 2+ (A) 10/29/2023 1524   LEUKOCYTESUR LARGE (A) 02/19/2023 1701    Lab Results  Component Value Date   LABMICR See below: 10/29/2023   WBCUA >30 (A) 10/29/2023   LABEPIT 0-10 10/29/2023   MUCUS Present (A) 10/29/2023   BACTERIA Many (A) 10/29/2023    Pertinent Imaging:  No results found for this or any previous visit.  Results for orders placed during the hospital encounter of 10/07/18  US  Venous Img Lower Bilateral  Narrative CLINICAL DATA:  Bilateral lower extremity swelling.  EXAM: BILATERAL LOWER EXTREMITY VENOUS DOPPLER ULTRASOUND  TECHNIQUE: Gray-scale sonography with graded compression, as well as color Doppler and duplex ultrasound were performed to evaluate the lower extremity deep venous systems from the level of the common femoral vein and including the common femoral, femoral, profunda femoral, popliteal and calf veins including the posterior tibial, peroneal and gastrocnemius veins when visible. The superficial great saphenous vein was also interrogated. Spectral Doppler  was utilized to evaluate flow at rest and with distal augmentation maneuvers in the common femoral, femoral and popliteal veins.  COMPARISON:  None.  FINDINGS: RIGHT LOWER EXTREMITY  Common Femoral Vein: No evidence of thrombus. Normal compressibility, respiratory phasicity and response to augmentation.  Saphenofemoral Junction: No evidence of thrombus. Normal compressibility and flow on color Doppler imaging.  Profunda Femoral Vein: No evidence of thrombus. Normal compressibility and flow on color Doppler imaging.  Femoral Vein: No evidence of thrombus. Normal compressibility, respiratory phasicity and response to augmentation.  Popliteal Vein: No evidence of thrombus. Normal compressibility, respiratory phasicity and response to augmentation.  Calf Veins: No evidence of thrombus. Normal compressibility and flow on color Doppler imaging.  Superficial Great Saphenous Vein: No evidence of thrombus. Normal compressibility.  Other Findings:  None.  LEFT LOWER EXTREMITY  Common Femoral Vein: No evidence of thrombus. Normal compressibility, respiratory phasicity and response to augmentation.  Saphenofemoral Junction: No evidence of thrombus. Normal compressibility and flow on color Doppler imaging.  Profunda Femoral Vein: No evidence of thrombus. Normal compressibility and flow on color Doppler imaging.  Femoral Vein: No evidence of thrombus. Normal compressibility, respiratory phasicity and response to augmentation.  Popliteal Vein: No evidence of thrombus. Normal compressibility, respiratory phasicity and response to augmentation.  Calf Veins: No evidence of thrombus. Normal compressibility and flow on color Doppler imaging.  Other Findings: None.  IMPRESSION: No evidence of deep venous thrombosis in either lower extremity.   Electronically Signed By: Debby  Register On: 10/07/2018 14:00  No results found for this or any previous visit.  No results found  for this or any previous visit.  Results for orders placed during the hospital encounter of 02/19/23  US  RENAL  Narrative CLINICAL DATA:  Acute kidney injury.  EXAM: RENAL / URINARY TRACT ULTRASOUND COMPLETE  COMPARISON:  CT scan 05/09/2022.  FINDINGS: Right Kidney:  Renal measurements: 12.4 x 6.0 x 5.9 cm = volume: 229 mL. Moderate right renal collecting system dilatation. No perinephric fluid. Preserved parenchyma.  Left Kidney:  Renal measurements: 12.6 x 6.2 x 5.2 cm = volume: 214.5 mL. Moderate collecting system dilatation. No perinephric fluid. Preserved parenchyma.  Bladder:  Distended bladder with significant wall thickening measuring up to 11 mm. Prevoid volume of 294 cc.  Other:  Evaluation somewhat limited by patient has been difficulty with procedure as per the sonographer.  IMPRESSION: Distended urinary bladder with a significant wall thickening. There is also moderate bilateral renal collecting system dilatation. Would recommend repeat study after decompression  of the bladder to see if this persists or additional cross-sectional imaging workup with a CT when clinically appropriate   Electronically Signed By: Ranell Bring M.D. On: 02/20/2023 11:04  No results found for this or any previous visit.  Results for orders placed during the hospital encounter of 05/09/22  CT HEMATURIA WORKUP  Narrative CLINICAL DATA:  Hematuria. Bladder mass on recent MRI. Rectal carcinoma. * Tracking Code: BO *  EXAM: CT ABDOMEN AND PELVIS WITHOUT AND WITH CONTRAST  TECHNIQUE: Multidetector CT imaging of the abdomen and pelvis was performed following the standard protocol before and following the bolus administration of intravenous contrast.  RADIATION DOSE REDUCTION: This exam was performed according to the departmental dose-optimization program which includes automated exposure control, adjustment of the mA and/or kV according to patient size and/or use  of iterative reconstruction technique.  CONTRAST:  OMNIPAQUE  IOHEXOL  300 MG/ML  SOLN  COMPARISON:  05/01/2022  FINDINGS: Lower Chest: No acute findings.  Hepatobiliary: No hepatic masses identified. Gallbladder is unremarkable. No evidence of biliary ductal dilatation.  Pancreas:  No mass or inflammatory changes.  Spleen: Within normal limits in size and appearance.  Adrenals/Urinary Tract: No adrenal masses identified. Few tiny 1-2 mm calculi are noted in both kidneys. No evidence of ureteral calculi or hydronephrosis. No suspicious renal masses identified. No masses seen involving the collecting systems or ureters. Mild diffuse bladder wall thickening and trabeculation is seen which may be due to chronic bladder outlet obstruction or cystitis. A focal enhancing soft tissue nodule measuring approximately 1.2 cm is seen along the left lateral bladder wall, suspicious for bladder carcinoma.  Stomach/Bowel: Concentric rectal wall thickening and enhancement is seen, consistent with known primary rectal carcinoma. No evidence of obstruction, inflammatory process or abnormal fluid collections. Normal appendix visualized. Diverticulosis is seen mainly involving the descending and sigmoid colon, however there is no evidence of diverticulitis.  Vascular/Lymphatic: No pathologically enlarged lymph nodes. No acute vascular findings. Aortic atherosclerotic calcification incidentally noted. 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  Reproductive:  No mass or other significant abnormality.  Other:  None.  Musculoskeletal:  No suspicious bone lesions identified.  IMPRESSION: 1.2 cm focal enhancing soft tissue nodule along the left lateral bladder wall, suspicious for bladder carcinoma.  Low rectal soft tissue mass, consistent with known primary rectal carcinoma.  No evidence of metastatic disease within the abdomen or  pelvis.  Mild diffuse bladder wall thickening and trabeculation, which may be due to chronic bladder outlet obstruction or cystitis.  Tiny nonobstructing bilateral renal calculi.  Colonic diverticulosis, without radiographic evidence of diverticulitis.  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  Aortic Atherosclerosis (ICD10-I70.0).   Electronically Signed By: Norleen DELENA Kil M.D. On: 05/09/2022 10:58  No results found for this or any previous visit.   Assessment & Plan:    1. Urinary retention (Primary) The patient desires SP tube placement and I will refer him to Interventional radiology - INSERT,TEMP INDWELLING BLAD CATH,SIMPLE - IR CYSTOSTOMY TUBE PLACEMENT/BLADDER ASPIRATION; Future  2. Overactive bladder Patient will proceed with intravesical botox after SP tube placement - Ambulatory Referral For Surgery Scheduling   No follow-ups on file.  Belvie Clara, MD  [facility] [place]

## 2023-10-30 NOTE — Patient Instructions (Signed)

## 2023-11-01 LAB — URINE CULTURE

## 2023-11-02 ENCOUNTER — Ambulatory Visit: Payer: Self-pay

## 2023-11-02 MED ORDER — SULFAMETHOXAZOLE-TRIMETHOPRIM 800-160 MG PO TABS: 1.0000 | ORAL_TABLET | Freq: Two times a day (BID) | ORAL | 0 refills | Status: DC

## 2023-11-02 NOTE — Telephone Encounter (Signed)
 Son made aware of positive urine culture and Bactrim  sent to pharmacy per Dr. Sherrilee.

## 2023-11-06 ENCOUNTER — Other Ambulatory Visit (HOSPITAL_COMMUNITY): Payer: Self-pay | Admitting: Student

## 2023-11-06 DIAGNOSIS — N401 Enlarged prostate with lower urinary tract symptoms: Secondary | ICD-10-CM

## 2023-11-07 ENCOUNTER — Other Ambulatory Visit: Payer: Self-pay | Admitting: Student

## 2023-11-08 ENCOUNTER — Ambulatory Visit (HOSPITAL_COMMUNITY): Admission: RE | Admit: 2023-11-08 | Source: Ambulatory Visit

## 2023-11-11 ENCOUNTER — Other Ambulatory Visit: Payer: Self-pay

## 2023-11-12 ENCOUNTER — Other Ambulatory Visit: Payer: Self-pay | Admitting: Student

## 2023-11-12 ENCOUNTER — Encounter (HOSPITAL_COMMUNITY): Payer: Self-pay

## 2023-11-12 DIAGNOSIS — R338 Other retention of urine: Secondary | ICD-10-CM

## 2023-11-12 NOTE — H&P (Signed)
 Chief Complaint: Urinary Retention and muscle spasm. Request is for suprapubic catheter placement     Referring Physician(s): McKenzie,Patrick L  Supervising Physician: Philip Cornet  Patient Status: [facility] - Out-pt  History of Present Illness: James Miles is a 78 y.o. male  outpatient. History of COPD (oxygen dependent), a flutter s/p ablation, bladder tumor s/p TURBT, DM, HLD. Found to have urinary retention and bladder spasms. Team is requesting a suprapubic catheter placement.  Family at bedside. Currently without any significant complaints. Patient alert and laying in bed,calm. Denies any fevers, headache, chest pain, SOB, cough, abdominal pain, nausea, vomiting or bleeding.   Labs pending. All medications are within acceptable parameters. NKDA. Patient has been NPO since midnight.   Return precautions and treatment recommendations and follow-up discussed with the patient  who is agreeable with the plan.   Past Medical History:  Diagnosis Date   Back pain    COPD (chronic obstructive pulmonary disease) (HCC)    Diabetes mellitus without complication (HCC)    Dyspnea    Dysrhythmia    Hyperlipidemia    Oxygen dependent    4L when needed    Past Surgical History:  Procedure Laterality Date   A-FLUTTER ABLATION N/A 08/23/2022   Procedure: A-FLUTTER ABLATION;  Surgeon: [name], MD;  [facility]: [facility];  Service: Cardiovascular;  Laterality: N/A;   BIOPSY  04/26/2022   Procedure: BIOPSY;  Surgeon: Shaaron Lamar HERO, MD;  [facility]: [facility];  Service: Endoscopy;;   BLADDER INSTILLATION N/A 11/16/2022   Procedure: BLADDER INSTILLATION- Gemcitibine;  Surgeon: Sherrilee Belvie CROME, MD;  Location: [facility];  Service: Urology;  Laterality: N/A;   COLONOSCOPY WITH PROPOFOL  N/A 04/26/2022   Procedure: COLONOSCOPY WITH PROPOFOL ;  Surgeon: Shaaron Lamar HERO, MD;  [facility]: [facility];  Service: Endoscopy;  Laterality: N/A;  10:00am; ASA 3   CYSTOSCOPY N/A  11/16/2022   Procedure: CYSTOSCOPY;  Surgeon: Sherrilee Belvie CROME, MD;  Location: [facility];  Service: Urology;  Laterality: N/A;   CYSTOSCOPY WITH INSERTION OF UROLIFT N/A 05/28/2023   Procedure: CYSTOSCOPY WITH INSERTION OF UROLIFT;  Surgeon: Sherrilee Belvie CROME, MD;  Location: [facility];  Service: Urology;  Laterality: N/A;   FLEXIBLE SIGMOIDOSCOPY N/A 07/16/2023   Procedure: KINGSTON SIDE;  Surgeon: Shaaron Lamar HERO, MD;  [facility]: [facility];  Service: Endoscopy;  Laterality: N/A;  230pm, ok rm 1/2   hemorhoidectomy     IR IMAGING GUIDED PORT INSERTION  05/12/2022   POLYPECTOMY  04/26/2022   Procedure: POLYPECTOMY;  Surgeon: Shaaron Lamar HERO, MD;  [facility]: [facility];  Service: Endoscopy;;   TRANSURETHRAL RESECTION OF BLADDER TUMOR N/A 11/16/2022   Procedure: TRANSURETHRAL RESECTION OF BLADDER TUMOR (TURBT);  Surgeon: Sherrilee Belvie CROME, MD;  Location: [facility];  Service: Urology;  Laterality: N/A;    Allergies: Patient has no known allergies.  Medications: Prior to Admission medications   Medication Sig Start Date End Date Taking? Authorizing Provider  albuterol  (VENTOLIN  HFA) 108 (90 Base) MCG/ACT inhaler Inhale 2 puffs into the lungs every 6 (six) hours as needed for wheezing or shortness of breath. 03/29/19   [provider]  calcium  carbonate (TUMS - DOSED IN MG ELEMENTAL CALCIUM ) 500 MG chewable tablet Chew 1 tablet by mouth daily as needed for indigestion or heartburn.    [provider]  DM-Phenylephrine -Acetaminophen  (MUCINEX FAST-MAX CONG HEADACHE PO) Take 1-2 tablets by mouth daily as needed (congestion).    [provider]  febuxostat  (ULORIC ) 40  MG tablet Take 1 tablet (40 mg total) by mouth daily. Patient taking differently: Take 40 mg by mouth daily as needed (gout flare). 11/06/22   Rogers Hai, MD  fesoterodine  (TOVIAZ ) 8 MG TB24 tablet Take 1 tablet (8 mg total) by mouth daily. 08/24/23   McKenzie, Belvie CROME, MD  finasteride   (PROSCAR ) 5 MG tablet Take 1 tablet (5 mg total) by mouth daily. 03/21/23   McKenzie, Belvie CROME, MD  furosemide  (LASIX ) 20 MG tablet Take 1 tablet (20 mg total) by mouth daily as needed. 03/21/23   Rogers Hai, MD  GAVILYTE-G 236 g solution Take 4,000 mLs by mouth once. 06/21/23   [provider]  HYDROcodone -acetaminophen  (NORCO/VICODIN) 5-325 MG tablet Take 1 tablet by mouth every 6 (six) hours as needed for moderate pain (pain score 4-6). 05/28/23   McKenzie, Belvie CROME, MD  levofloxacin  (LEVAQUIN ) 500 MG tablet Take 1 tablet (500 mg total) by mouth daily. 08/20/23   Rogers Hai, MD  lidocaine -prilocaine  (EMLA ) cream Apply 1 Application topically. 05/17/22   [provider]  loperamide  (IMODIUM ) 2 MG capsule Take 2 mg by mouth 3 (three) times daily as needed for diarrhea or loose stools.    [provider]  MELATONIN PO Take 1 tablet by mouth at bedtime as needed (sleep).    [provider]  mirabegron  ER (MYRBETRIQ ) 25 MG TB24 tablet Take 1 tablet (25 mg total) by mouth daily. 07/23/23   McKenzie, Belvie CROME, MD  Misc. Devices MISC Please provide with shower chair 08/07/22   Rogers Hai, MD  naproxen sodium (ALEVE) 220 MG tablet Take 220 mg by mouth daily as needed (pain).    [provider]  prochlorperazine  (COMPAZINE ) 10 MG tablet Take 10 mg by mouth every 6 (six) hours as needed for nausea or vomiting.    [provider]  silodosin  (RAPAFLO ) 8 MG CAPS capsule Take 1 capsule (8 mg total) by mouth daily with breakfast. 03/21/23   McKenzie, Belvie CROME, MD  sulfamethoxazole -trimethoprim  (BACTRIM  DS) 800-160 MG tablet Take 1 tablet by mouth 2 (two) times daily. 11/02/23   McKenzie, Belvie CROME, MD     No family history on file.  Social History   Socioeconomic History   Marital status: Widowed    Spouse name: Not on file   Number of children: 2   Years of education: Not on file   Highest education level: Not on file   Occupational History   Not on file  Tobacco Use   Smoking status: Former    Current packs/day: 0.00    Types: Cigarettes    Start date: 08/1966    Quit date: 08/2016    Years since quitting: 7.2   Smokeless tobacco: Never  Vaping Use   Vaping status: Never Used  Substance and Sexual Activity   Alcohol use: Never   Drug use: Never   Sexual activity: Not Currently  Other Topics Concern   Not on file  Social History Narrative   Not on file   Social Drivers of Health   Financial Resource Strain: Not on file  Food Insecurity: No Food Insecurity (02/19/2023)   Hunger Vital Sign    Worried About Running Out of Food in the Last Year: Never true    Ran Out of Food in the Last Year: Never true  Transportation Needs: No Transportation Needs (02/19/2023)   PRAPARE - Administrator, Civil Service (Medical): No    Lack of Transportation (Non-Medical): No  Physical Activity:  Not on file  Stress: Not on file  Social Connections: Unknown (02/19/2023)   Social Connection and Isolation Panel    Frequency of Communication with Friends and Family: Patient unable to answer    Frequency of Social Gatherings with Friends and Family: Patient unable to answer    Attends Religious Services: Patient unable to answer    Active Member of Clubs or Organizations: Patient unable to answer    Attends Banker Meetings: Patient unable to answer    Marital Status: Widowed      Review of Systems: A 12 point ROS discussed and pertinent positives are indicated in the HPI above.  All other systems are negative.  Review of Systems  Constitutional:  Negative for fever.  HENT:  Negative for congestion.   Respiratory:  Negative for cough and shortness of breath.   Cardiovascular:  Negative for chest pain.  Gastrointestinal:  Negative for abdominal pain.  Neurological:  Negative for headaches.  Psychiatric/Behavioral:  Negative for behavioral problems and confusion.     Vital  Signs: BP 114/78   Pulse 79   Temp 97.8 F (36.6 C) (Oral)   Resp (!) 22   Ht 5' 11 (1.803 m)   Wt 160 lb (72.6 kg)   SpO2 97%   BMI 22.32 kg/m   Advance Care Plan: The advanced care plan/surrogate decision maker was discussed at the time of visit and the patient did not wish to discuss or was not able to name a surrogate decision maker or provide an advance care plan.    Physical Exam Vitals and nursing note reviewed.  Constitutional:      Appearance: He is well-developed.  HENT:     Head: Normocephalic.  Cardiovascular:     Rate and Rhythm: Normal rate.  Pulmonary:     Effort: Pulmonary effort is normal.     Breath sounds: Normal breath sounds.  Genitourinary:    Comments: Foley catheter in place. Clamped. Amber colored urine noted to be in the foley bag,  Musculoskeletal:        General: Normal range of motion.     Cervical back: Normal range of motion.  Skin:    General: Skin is warm and dry.  Neurological:     General: No focal deficit present.     Mental Status: He is alert and oriented to person, place, and time. Mental status is at baseline.  Psychiatric:        Mood and Affect: Mood normal.        Behavior: Behavior normal.        Thought Content: Thought content normal.        Judgment: Judgment normal.     Imaging: No results found.  Labs:  CBC: Recent Labs    02/23/23 0629 03/06/23 0951 06/11/23 1435 07/31/23 1329  WBC 8.4 6.6 10.3 13.2*  HGB 9.5* 9.8* 12.3* 10.6*  HCT 31.2* 32.2* 39.8 35.1*  PLT 385 312 278 272    COAGS: No results for input(s): INR, APTT in the last 8760 hours.  BMP: Recent Labs    02/23/23 0629 03/06/23 0951 06/11/23 1435 07/31/23 1329  NA 135 139 135 139  K 5.0 4.0 4.0 4.5  CL 102 103 98 101  CO2 24 29 25 27   GLUCOSE 120* 114* 111* 131*  BUN 28* 22 19 21   CALCIUM  8.3* 8.9 9.4 9.2  CREATININE 2.22* 1.20 0.92 0.96  GFRNONAA 30* >60 >60 >60    LIVER FUNCTION TESTS: Recent  Labs    02/20/23 0845  03/06/23 0951 06/11/23 1435 07/31/23 1329  BILITOT 0.9 0.5 0.7 0.7  AST 14* 16 13* 14*  ALT 15 15 13 13   ALKPHOS 54 60 78 85  PROT 5.7* 6.6 7.4 7.1  ALBUMIN 2.3* 2.9* 3.4* 3.4*    TUMOR MARKERS: No results for input(s): AFPTM, CEA, CA199, CHROMGRNA in the last 8760 hours.  Assessment and Plan:  78 y.o. male outpatient. History of COPD (oxygen dependent), a flutter s/p ablation, bladder tumor s/p TURBT, DM, HLD. Found to have urinary retention and bladder spasms. Team is requesting a suprapubic catheter placement  PLAN: IR Image Guided Suprapubic Catheter Placement  Risks and benefits discussed with the patient including bleeding, infection, damage to adjacent structures, bowel perforation/fistula connection, and sepsis.  All of the patient's questions were answered, patient is agreeable to proceed. Consent signed and in chart.   Thank you for this interesting consult.  I greatly enjoyed meeting James Miles and look forward to participating in their care.  A copy of this report was sent to the requesting provider on this date.  Electronically Signed: Delon JAYSON Beagle, NP 11/13/2023, 11:10 AM   I spent a total of  30 Minutes   in face to face in clinical consultation, greater than 50% of which was counseling/coordinating care for suprapubic catheter placement

## 2023-11-13 ENCOUNTER — Ambulatory Visit (HOSPITAL_COMMUNITY)
Admission: RE | Admit: 2023-11-13 | Discharge: 2023-11-13 | Disposition: A | Discharge status: Home / Self Care | Source: Ambulatory Visit | Attending: Urology | Admitting: Urology

## 2023-11-13 ENCOUNTER — Other Ambulatory Visit

## 2023-11-13 ENCOUNTER — Inpatient Hospital Stay (HOSPITAL_BASED_OUTPATIENT_CLINIC_OR_DEPARTMENT_OTHER)
Admission: RE | Admit: 2023-11-13 | Discharge: 2023-11-13 | Disposition: A | Discharge status: Home / Self Care | Source: Ambulatory Visit | Attending: Student | Admitting: Student

## 2023-11-13 ENCOUNTER — Encounter: Payer: Self-pay | Admitting: Cardiovascular Disease

## 2023-11-13 ENCOUNTER — Other Ambulatory Visit: Payer: Self-pay

## 2023-11-13 ENCOUNTER — Telehealth: Payer: Self-pay | Admitting: Physician Assistant

## 2023-11-13 VITALS — BP 110/46 | HR 97 | Temp 97.8°F | Resp 16 | Ht 71.0 in | Wt 160.0 lb

## 2023-11-13 DIAGNOSIS — Z9981 Dependence on supplemental oxygen: Secondary | ICD-10-CM | POA: Diagnosis not present

## 2023-11-13 DIAGNOSIS — J449 Chronic obstructive pulmonary disease, unspecified: Secondary | ICD-10-CM | POA: Insufficient documentation

## 2023-11-13 DIAGNOSIS — I480 Paroxysmal atrial fibrillation: Secondary | ICD-10-CM | POA: Diagnosis not present

## 2023-11-13 DIAGNOSIS — R339 Retention of urine, unspecified: Secondary | ICD-10-CM | POA: Insufficient documentation

## 2023-11-13 DIAGNOSIS — Z79899 Other long term (current) drug therapy: Secondary | ICD-10-CM | POA: Insufficient documentation

## 2023-11-13 DIAGNOSIS — Z87891 Personal history of nicotine dependence: Secondary | ICD-10-CM | POA: Insufficient documentation

## 2023-11-13 DIAGNOSIS — R338 Other retention of urine: Secondary | ICD-10-CM

## 2023-11-13 DIAGNOSIS — N401 Enlarged prostate with lower urinary tract symptoms: Secondary | ICD-10-CM

## 2023-11-13 DIAGNOSIS — I4891 Unspecified atrial fibrillation: Secondary | ICD-10-CM

## 2023-11-13 HISTORY — PX: IR CYSTOSTOMY TUBE PLACEMENT/BLADDER ASPIRATION: IMG1097

## 2023-11-13 LAB — BASIC METABOLIC PANEL WITH GFR
Anion gap: 12 (ref 5–15)
BUN: 22 mg/dL (ref 8–23)
CO2: 26 mmol/L (ref 22–32)
Calcium: 9.4 mg/dL (ref 8.9–10.3)
Chloride: 100 mmol/L (ref 98–111)
Creatinine, Ser: 1.41 mg/dL — ABNORMAL HIGH (ref 0.61–1.24)
GFR, Estimated: 51 mL/min — ABNORMAL LOW (ref >60)
Glucose, Bld: 116 mg/dL — ABNORMAL HIGH (ref 70–99)
Potassium: 4.7 mmol/L (ref 3.5–5.1)
Sodium: 138 mmol/L (ref 135–145)

## 2023-11-13 LAB — CBC
HCT: 40 % (ref 39.0–52.0)
Hemoglobin: 12.6 g/dL — ABNORMAL LOW (ref 13.0–17.0)
MCH: 28.6 pg (ref 26.0–34.0)
MCHC: 31.5 g/dL (ref 30.0–36.0)
MCV: 90.7 fL (ref 80.0–100.0)
Platelets: 317 K/uL (ref 150–400)
RBC: 4.41 MIL/uL (ref 4.22–5.81)
RDW: 13.9 % (ref 11.5–15.5)
WBC: 11.4 K/uL — ABNORMAL HIGH (ref 4.0–10.5)
nRBC: 0 % (ref 0.0–0.2)

## 2023-11-13 LAB — GLUCOSE, CAPILLARY: Glucose-Capillary: 125 mg/dL — ABNORMAL HIGH (ref 70–99)

## 2023-11-13 LAB — PROTIME-INR
INR: 1 (ref 0.8–1.2)
Prothrombin Time: 13.7 s (ref 11.4–15.2)

## 2023-11-13 MED ORDER — CEFAZOLIN SODIUM-DEXTROSE 2-4 GM/100ML-% IV SOLN
INTRAVENOUS | Status: AC
  Filled 2023-11-13: qty 100

## 2023-11-13 MED ORDER — APIXABAN 5 MG PO TABS: 5.0000 mg | ORAL_TABLET | Freq: Two times a day (BID) | ORAL | 11 refills | Status: AC

## 2023-11-13 MED ORDER — DILTIAZEM HCL 25 MG/5ML IV SOLN
INTRAVENOUS | Status: AC
  Filled 2023-11-13: qty 5

## 2023-11-13 MED ORDER — SILVER NITRATE-POT NITRATE 75-25 % EX MISC
6.0000 | Freq: Once | CUTANEOUS | Status: DC
  Filled 2023-11-13: qty 10

## 2023-11-13 MED ORDER — FENTANYL CITRATE (PF) 100 MCG/2ML IJ SOLN
INTRAMUSCULAR | Status: AC | PRN
  Administered 2023-11-13 (×2): 50 ug via INTRAVENOUS

## 2023-11-13 MED ORDER — MIDAZOLAM HCL 2 MG/2ML IJ SOLN
INTRAMUSCULAR | Status: AC | PRN
  Administered 2023-11-13 (×2): 1 mg via INTRAVENOUS

## 2023-11-13 MED ORDER — MIDAZOLAM HCL 2 MG/2ML IJ SOLN
INTRAMUSCULAR | Status: AC
  Filled 2023-11-13: qty 2

## 2023-11-13 MED ORDER — DILTIAZEM HCL ER COATED BEADS 240 MG PO CP24: 240.0000 mg | ORAL_CAPSULE | Freq: Every day | ORAL | 11 refills | Status: AC

## 2023-11-13 MED ORDER — IOHEXOL 300 MG/ML  SOLN
50.0000 mL | Freq: Once | INTRAMUSCULAR | Status: AC | PRN
  Administered 2023-11-13: 30 mL

## 2023-11-13 MED ORDER — CEFAZOLIN SODIUM-DEXTROSE 1-4 GM/50ML-% IV SOLN: 1.0000 g | INTRAVENOUS | Status: DC

## 2023-11-13 MED ORDER — DILTIAZEM HCL ER COATED BEADS 240 MG PO CP24
240.0000 mg | ORAL_CAPSULE | Freq: Every day | ORAL | Status: DC
  Filled 2023-11-13: qty 1

## 2023-11-13 MED ORDER — APIXABAN 5 MG PO TABS: 5.0000 mg | ORAL_TABLET | Freq: Two times a day (BID) | ORAL | Status: DC

## 2023-11-13 MED ORDER — FENTANYL CITRATE (PF) 100 MCG/2ML IJ SOLN
INTRAMUSCULAR | Status: AC
  Filled 2023-11-13: qty 2

## 2023-11-13 MED ORDER — DILTIAZEM HCL 25 MG/5ML IV SOLN
10.0000 mg | Freq: Once | INTRAVENOUS | Status: AC
  Administered 2023-11-13: 10 mg via INTRAVENOUS

## 2023-11-13 MED ORDER — SODIUM CHLORIDE 0.9 % IV SOLN: INTRAVENOUS | Status: DC

## 2023-11-13 MED ORDER — LIDOCAINE HCL 1 % IJ SOLN
INTRAMUSCULAR | Status: AC
  Filled 2023-11-13: qty 20

## 2023-11-13 MED ORDER — LIDOCAINE HCL 1 % IJ SOLN
20.0000 mL | Freq: Once | INTRAMUSCULAR | Status: AC
  Administered 2023-11-13: 10 mL

## 2023-11-13 MED ORDER — CEFAZOLIN SODIUM-DEXTROSE 2-4 GM/100ML-% IV SOLN
2.0000 g | Freq: Once | INTRAVENOUS | Status: AC
  Administered 2023-11-13: 2 g via INTRAVENOUS

## 2023-11-13 NOTE — Progress Notes (Signed)
 Foley clamped for suprapubic cath placement

## 2023-11-13 NOTE — Consult Note (Signed)
 James Maude BROCKS, MD Physician Cardiology   Progress Notes     Sign when Signing Visit   Encounter Date: 11/13/2023   Sign when Signing Visit     Expand All Collapse All  CARDIOLOGY CONSULT NOTE           Patient ID: James Miles MRN: [id number] DOB/AGE: 1946/01/16 78 y.o.   Admit date: (Not on file) Referring Physician: Philip Primary Physician: [name], NP Primary Cardiologist: James Reason for Consultation: Arrhythmia   Active Problems:   * No active hospital problems. *     HPI:  78 y.o. asked to see by Dr Philip post suprapubic catheter for arrhythmia Patient known to me. History of ablation and PAF. Not on anticoagulation now Has been in NSR for quite a long time No issues with supr pubic catheter for retention. During procedure had PAF/flutter with rates 100-140 bpm. Patient asymptomatic with good BP Labs ok No contraindication to DOAC. Use to be on blood thinners Most recent Cr 1.4 Has COPD Echo done 10/25/22 with normal EF 60-65% Flutter ablation done by Dr Waddell 08/23/22    ROS All other systems reviewed and negative except as noted above       Past Medical History:  Diagnosis Date   Back pain     COPD (chronic obstructive pulmonary disease) (HCC)     Diabetes mellitus without complication (HCC)     Dyspnea     Dysrhythmia     Hyperlipidemia     Oxygen dependent      4L when needed        No family history on file.      Social History         Socioeconomic History   Marital status: Widowed      Spouse name: Not on file   Number of children: 2   Years of education: Not on file   Highest education level: Not on file  Occupational History   Not on file  Tobacco Use   Smoking status: Former      Current packs/day: 0.00      Types: Cigarettes      Start date: 08/1966      Quit date: 08/2016      Years since quitting: 7.2   Smokeless tobacco: Never  Vaping Use   Vaping status: Never Used  Substance and Sexual Activity    Alcohol use: Never   Drug use: Never   Sexual activity: Not Currently  Other Topics Concern   Not on file  Social History Narrative   Not on file    Social Drivers of Health        Financial Resource Strain: Not on file  Food Insecurity: No Food Insecurity (02/19/2023)    Hunger Vital Sign     Worried About Running Out of Food in the Last Year: Never true     Ran Out of Food in the Last Year: Never true  Transportation Needs: No Transportation Needs (02/19/2023)    PRAPARE - Therapist, art (Medical): No     Lack of Transportation (Non-Medical): No  Physical Activity: Not on file  Stress: Not on file  Social Connections: Unknown (02/19/2023)    Social Connection and Isolation Panel     Frequency of Communication with Friends and Family: Patient unable to answer     Frequency of Social Gatherings with Friends and Family: Patient unable to answer     Attends Religious  Services: Patient unable to answer     Active Member of Clubs or Organizations: Patient unable to answer     Attends Club or Organization Meetings: Patient unable to answer     Marital Status: Widowed  Intimate Partner Violence: Not At Risk (02/19/2023)    Humiliation, Afraid, Rape, and Kick questionnaire     Fear of Current or Ex-Partner: No     Emotionally Abused: No     Physically Abused: No     Sexually Abused: No         Past Surgical History:  Procedure Laterality Date   A-FLUTTER ABLATION N/A 08/23/2022    Procedure: A-FLUTTER ABLATION;  Surgeon: [name], MD;  [facility];  Service: Cardiovascular;  Laterality: N/A;   BIOPSY   04/26/2022    Procedure: BIOPSY;  Surgeon: Shaaron Lamar HERO, MD;  [facility];  Service: Endoscopy;;   BLADDER INSTILLATION N/A 11/16/2022    Procedure: BLADDER INSTILLATION- Gemcitibine;  Surgeon: Sherrilee Belvie CROME, MD;  Location: [facility];  Service: Urology;  Laterality: N/A;   COLONOSCOPY WITH PROPOFOL  N/A 04/26/2022     Procedure: COLONOSCOPY WITH PROPOFOL ;  Surgeon: Shaaron Lamar HERO, MD;  [facility];  Service: Endoscopy;  Laterality: N/A;  10:00am; ASA 3   CYSTOSCOPY N/A 11/16/2022    Procedure: CYSTOSCOPY;  Surgeon: Sherrilee Belvie CROME, MD;  Location: [facility];  Service: Urology;  Laterality: N/A;   CYSTOSCOPY WITH INSERTION OF UROLIFT N/A 05/28/2023    Procedure: CYSTOSCOPY WITH INSERTION OF UROLIFT;  Surgeon: Sherrilee Belvie CROME, MD;  Location: [facility];  Service: Urology;  Laterality: N/A;   FLEXIBLE SIGMOIDOSCOPY N/A 07/16/2023    Procedure: KINGSTON SIDE;  Surgeon: Shaaron Lamar HERO, MD;  [facility];  Service: Endoscopy;  Laterality: N/A;  230pm, ok rm 1/2   hemorhoidectomy       IR IMAGING GUIDED PORT INSERTION   05/12/2022   POLYPECTOMY   04/26/2022    Procedure: POLYPECTOMY;  Surgeon: Shaaron Lamar HERO, MD;  [facility];  Service: Endoscopy;;   TRANSURETHRAL RESECTION OF BLADDER TUMOR N/A 11/16/2022    Procedure: TRANSURETHRAL RESECTION OF BLADDER TUMOR (TURBT);  Surgeon: Sherrilee Belvie CROME, MD;  Location: [facility];  Service: Urology;  Laterality: N/A;           Current Medications    Current Outpatient Medications:    albuterol  (VENTOLIN  HFA) 108 (90 Base) MCG/ACT inhaler, Inhale 2 puffs into the lungs every 6 (six) hours as needed for wheezing or shortness of breath., Disp: , Rfl:    calcium  carbonate (TUMS - DOSED IN MG ELEMENTAL CALCIUM ) 500 MG chewable tablet, Chew 1 tablet by mouth daily as needed for indigestion or heartburn., Disp: , Rfl:    DM-Phenylephrine -Acetaminophen  (MUCINEX FAST-MAX CONG HEADACHE PO), Take 1-2 tablets by mouth daily as needed (congestion)., Disp: , Rfl:    febuxostat  (ULORIC ) 40 MG tablet, Take 1 tablet (40 mg total) by mouth daily. (Patient taking differently: Take 40 mg by mouth daily as needed (gout flare).), Disp: 90 tablet, Rfl: 1   fesoterodine  (TOVIAZ ) 8 MG TB24 tablet, Take 1 tablet (8 mg total) by mouth daily., Disp: 30  tablet, Rfl: 11   finasteride  (PROSCAR ) 5 MG tablet, Take 1 tablet (5 mg total) by mouth daily., Disp: 90 tablet, Rfl: 3   furosemide  (LASIX ) 20 MG tablet, Take 1 tablet (20 mg total) by mouth daily as needed., Disp: 30 tablet, Rfl: 2   GAVILYTE-G  236 g solution, Take 4,000 mLs by mouth once., Disp: , Rfl:    HYDROcodone -acetaminophen  (NORCO/VICODIN) 5-325 MG tablet, Take 1 tablet by mouth every 6 (six) hours as needed for moderate pain (pain score 4-6)., Disp: 15 tablet, Rfl: 0   levofloxacin  (LEVAQUIN ) 500 MG tablet, Take 1 tablet (500 mg total) by mouth daily., Disp: 10 tablet, Rfl: 0   lidocaine -prilocaine  (EMLA ) cream, Apply 1 Application topically., Disp: , Rfl:    loperamide  (IMODIUM ) 2 MG capsule, Take 2 mg by mouth 3 (three) times daily as needed for diarrhea or loose stools., Disp: , Rfl:    MELATONIN PO, Take 1 tablet by mouth at bedtime as needed (sleep)., Disp: , Rfl:    mirabegron  ER (MYRBETRIQ ) 25 MG TB24 tablet, Take 1 tablet (25 mg total) by mouth daily., Disp: 30 tablet, Rfl: 11   Misc. Devices MISC, Please provide with shower chair, Disp: 1 Units, Rfl: 0   naproxen sodium (ALEVE) 220 MG tablet, Take 220 mg by mouth daily as needed (pain)., Disp: , Rfl:    prochlorperazine  (COMPAZINE ) 10 MG tablet, Take 10 mg by mouth every 6 (six) hours as needed for nausea or vomiting., Disp: , Rfl:    silodosin  (RAPAFLO ) 8 MG CAPS capsule, Take 1 capsule (8 mg total) by mouth daily with breakfast., Disp: 30 capsule, Rfl: 11   sulfamethoxazole -trimethoprim  (BACTRIM  DS) 800-160 MG tablet, Take 1 tablet by mouth 2 (two) times daily., Disp: 14 tablet, Rfl: 0 No current facility-administered medications for this visit.   Facility-Administered Medications Ordered in Other Visits:    0.9 %  sodium chloride  infusion, , Intravenous, Continuous, Han, Aimee H, PA-C   0.9 %  sodium chloride  infusion, , Intravenous, Continuous, Covington, Jamie R, NP, Last Rate: 40 mL/hr at 11/13/23 1210, New Bag at  11/13/23 1210   fentaNYL  (SUBLIMAZE ) injection, , Intravenous, PRN, Vanice Sharper, MD, 50 mcg at 11/13/23 1219   midazolam  (VERSED ) injection, , Intravenous, PRN, Vanice Sharper, MD, 1 mg at 11/13/23 1218                 Physical Exam: There were no vitals taken for this visit.     Elderly white male No distress Lungs clear No murmur Abdomen benign  New supr pubic catheter draining well  No edema   Labs:   Recent Labs       Lab Results  Component Value Date    WBC 11.4 (H) 11/13/2023    HGB 12.6 (L) 11/13/2023    HCT 40.0 11/13/2023    MCV 90.7 11/13/2023    PLT 317 11/13/2023      Last Labs     Recent Labs  Lab 11/13/23 1040  NA 138  K 4.7  CL 100  CO2 26  BUN 22  CREATININE 1.41*  CALCIUM  9.4  GLUCOSE 116*      Recent Labs  No results found for: CKTOTAL, CKMB, CKMBINDEX, TROPONINI    Recent Labs       Lab Results  Component Value Date    CHOL 165 07/08/2022    CHOL 161 10/03/2018      Recent Labs       Lab Results  Component Value Date    HDL 40 (L) 07/08/2022    HDL 42 10/03/2018      Recent Labs       Lab Results  Component Value Date    LDLCALC 62 07/08/2022    LDLCALC 93 10/03/2018    LDLCALC 119 10/03/2017  Recent Labs       Lab Results  Component Value Date    TRIG 313 (H) 07/08/2022    TRIG 165 (A) 10/03/2018      Recent Labs       Lab Results  Component Value Date    CHOLHDL 4.1 07/08/2022      Recent Labs  No results found for: [facility]      Radiology: Imaging Results  No results found.     EKG:  none telemetry review by me atrial flutter rate 100-140 bpm     ASSESSMENT AND PLAN:    PAF:  with history of such and prior ablation. Asymptomatic post procedure. Start cardizem  240 mg daily and eliquis  5 mg bid. 14 day outpatient monitor. Will arrange f/u in a week or two with me or afib clinic . Has seen Dr Waddell EP in passed COPD:  stable no active wheezing Urology:  post suprapubic  catheter f/u Dr Sherrilee   Signed: Maude Emmer 11/13/2023, 1:13 PM

## 2023-11-13 NOTE — Telephone Encounter (Signed)
   [employer] called the answering service after-hours today. Patient seen in consult by Dr. Delford today for atrial flutter with HR 100-140bpm, asymptomatic. Started on diltiazem  240mg  daily and Eliquis  5mg  BID with plan to see AF clinic in follow-up. Dr. Nishan recommended 2 week monitor. [employer] called for first documentation of afib at 2:07pm with HR 138bpm. They spoke to patient who reported no symptoms. This appears to be as expected based on notes. D/w Dr. Delford who agrees. Continue plan as previously outlined.  Diksha Tagliaferro N Linus Weckerly, PA-C

## 2023-11-13 NOTE — Progress Notes (Signed)
ZIO AT applied at hospital. Dr. Eden Emms to read.

## 2023-11-13 NOTE — Procedures (Signed)
 Interventional Radiology Procedure:   Indications: Urinary retention  Procedure: Placement of suprapubic bladder catheter  Findings: 16 Fr Council catheter placed with ultrasound and fluoroscopic guidance   Complications: None     EBL: Minimal  Plan:  Bedrest 2 hours.    Thurston Brendlinger R. Philip, MD  Pager: [phone]

## 2023-11-13 NOTE — Progress Notes (Unsigned)
 CARDIOLOGY CONSULT NOTE       Patient ID: James Miles MRN: [id number] DOB/AGE: [id number] y.o.  Admit date: (Not on file) Referring Physician: Philip Primary Physician: [name], NP Primary Cardiologist: Delford Reason for Consultation: Arrhythmia  Active Problems:   * No active hospital problems. *   HPI:  78 y.o. asked to see by Dr Philip post suprapubic catheter for arrhythmia Patient known to me. History of ablation and PAF. Not on anticoagulation now Has been in NSR for quite a long time No issues with supr pubic catheter for retention. During procedure had PAF/flutter with rates 100-140 bpm. Patient asymptomatic with good BP Labs ok No contraindication to DOAC. Use to be on blood thinners Most recent Cr 1.4 Has COPD Echo done 10/25/22 with normal EF 60-65% Flutter ablation done by Dr Waddell 08/23/22   ROS All other systems reviewed and negative except as noted above  Past Medical History:  Diagnosis Date   Back pain    COPD (chronic obstructive pulmonary disease) (HCC)    Diabetes mellitus without complication (HCC)    Dyspnea    Dysrhythmia    Hyperlipidemia    Oxygen dependent    4L when needed    No family history on file.  Social History   Socioeconomic History   Marital status: Widowed    Spouse name: Not on file   Number of children: 2   Years of education: Not on file   Highest education level: Not on file  Occupational History   Not on file  Tobacco Use   Smoking status: Former    Current packs/day: 0.00    Types: Cigarettes    Start date: 08/1966    Quit date: 08/2016    Years since quitting: 7.2   Smokeless tobacco: Never  Vaping Use   Vaping status: Never Used  Substance and Sexual Activity   Alcohol use: Never   Drug use: Never   Sexual activity: Not Currently  Other Topics Concern   Not on file  Social History Narrative   Not on file   Social Drivers of Health   Financial Resource Strain: Not on file  Food  Insecurity: No Food Insecurity (02/19/2023)   Hunger Vital Sign    Worried About Running Out of Food in the Last Year: Never true    Ran Out of Food in the Last Year: Never true  Transportation Needs: No Transportation Needs (02/19/2023)   PRAPARE - Administrator, Civil Service (Medical): No    Lack of Transportation (Non-Medical): No  Physical Activity: Not on file  Stress: Not on file  Social Connections: Unknown (02/19/2023)   Social Connection and Isolation Panel    Frequency of Communication with Friends and Family: Patient unable to answer    Frequency of Social Gatherings with Friends and Family: Patient unable to answer    Attends Religious Services: Patient unable to answer    Active Member of Clubs or Organizations: Patient unable to answer    Attends Banker Meetings: Patient unable to answer    Marital Status: Widowed  Intimate Partner Violence: Not At Risk (02/19/2023)   Humiliation, Afraid, Rape, and Kick questionnaire    Fear of Current or Ex-Partner: No    Emotionally Abused: No    Physically Abused: No    Sexually Abused: No    Past Surgical History:  Procedure Laterality Date   A-FLUTTER ABLATION N/A 08/23/2022   Procedure: A-FLUTTER ABLATION;  Surgeon:  [name], MD;  Location: [facility] INVASIVE CV LAB;  Service: Cardiovascular;  Laterality: N/A;   BIOPSY  04/26/2022   Procedure: BIOPSY;  Surgeon: Shaaron Lamar HERO, MD;  Location: [facility];  Service: Endoscopy;;   BLADDER INSTILLATION N/A 11/16/2022   Procedure: BLADDER INSTILLATION- Gemcitibine;  Surgeon: Sherrilee Belvie CROME, MD;  Location: [facility] ORS;  Service: Urology;  Laterality: N/A;   COLONOSCOPY WITH PROPOFOL  N/A 04/26/2022   Procedure: COLONOSCOPY WITH PROPOFOL ;  Surgeon: Shaaron Lamar HERO, MD;  Location: [facility];  Service: Endoscopy;  Laterality: N/A;  10:00am; ASA 3   CYSTOSCOPY N/A 11/16/2022   Procedure: CYSTOSCOPY;  Surgeon: Sherrilee Belvie CROME, MD;  Location: [facility];  Service:  Urology;  Laterality: N/A;   CYSTOSCOPY WITH INSERTION OF UROLIFT N/A 05/28/2023   Procedure: CYSTOSCOPY WITH INSERTION OF UROLIFT;  Surgeon: Sherrilee Belvie CROME, MD;  Location: [facility];  Service: Urology;  Laterality: N/A;   FLEXIBLE SIGMOIDOSCOPY N/A 07/16/2023   Procedure: KINGSTON SIDE;  Surgeon: Shaaron Lamar HERO, MD;  [facility]: [facility];  Service: Endoscopy;  Laterality: N/A;  230pm, ok rm 1/2   hemorhoidectomy     IR IMAGING GUIDED PORT INSERTION  05/12/2022   POLYPECTOMY  04/26/2022   Procedure: POLYPECTOMY;  Surgeon: Shaaron Lamar HERO, MD;  [facility]: [facility];  Service: Endoscopy;;   TRANSURETHRAL RESECTION OF BLADDER TUMOR N/A 11/16/2022   Procedure: TRANSURETHRAL RESECTION OF BLADDER TUMOR (TURBT);  Surgeon: Sherrilee Belvie CROME, MD;  Location: [facility];  Service: Urology;  Laterality: N/A;      Current Outpatient Medications:    albuterol  (VENTOLIN  HFA) 108 (90 Base) MCG/ACT inhaler, Inhale 2 puffs into the lungs every 6 (six) hours as needed for wheezing or shortness of breath., Disp: , Rfl:    calcium  carbonate (TUMS - DOSED IN MG ELEMENTAL CALCIUM ) 500 MG chewable tablet, Chew 1 tablet by mouth daily as needed for indigestion or heartburn., Disp: , Rfl:    DM-Phenylephrine -Acetaminophen  (MUCINEX FAST-MAX CONG HEADACHE PO), Take 1-2 tablets by mouth daily as needed (congestion)., Disp: , Rfl:    febuxostat  (ULORIC ) 40 MG tablet, Take 1 tablet (40 mg total) by mouth daily. (Patient taking differently: Take 40 mg by mouth daily as needed (gout flare).), Disp: 90 tablet, Rfl: 1   fesoterodine  (TOVIAZ ) 8 MG TB24 tablet, Take 1 tablet (8 mg total) by mouth daily., Disp: 30 tablet, Rfl: 11   finasteride  (PROSCAR ) 5 MG tablet, Take 1 tablet (5 mg total) by mouth daily., Disp: 90 tablet, Rfl: 3   furosemide  (LASIX ) 20 MG tablet, Take 1 tablet (20 mg total) by mouth daily as needed., Disp: 30 tablet, Rfl: 2   GAVILYTE-G 236 g solution, Take 4,000 mLs by mouth once., Disp: , Rfl:     HYDROcodone -acetaminophen  (NORCO/VICODIN) 5-325 MG tablet, Take 1 tablet by mouth every 6 (six) hours as needed for moderate pain (pain score 4-6)., Disp: 15 tablet, Rfl: 0   levofloxacin  (LEVAQUIN ) 500 MG tablet, Take 1 tablet (500 mg total) by mouth daily., Disp: 10 tablet, Rfl: 0   lidocaine -prilocaine  (EMLA ) cream, Apply 1 Application topically., Disp: , Rfl:    loperamide  (IMODIUM ) 2 MG capsule, Take 2 mg by mouth 3 (three) times daily as needed for diarrhea or loose stools., Disp: , Rfl:    MELATONIN PO, Take 1 tablet by mouth at bedtime as needed (sleep)., Disp: , Rfl:    mirabegron  ER (MYRBETRIQ ) 25 MG TB24 tablet, Take 1 tablet (25 mg total) by mouth daily.,  Disp: 30 tablet, Rfl: 11   Misc. Devices MISC, Please provide with shower chair, Disp: 1 Units, Rfl: 0   naproxen sodium (ALEVE) 220 MG tablet, Take 220 mg by mouth daily as needed (pain)., Disp: , Rfl:    prochlorperazine  (COMPAZINE ) 10 MG tablet, Take 10 mg by mouth every 6 (six) hours as needed for nausea or vomiting., Disp: , Rfl:    silodosin  (RAPAFLO ) 8 MG CAPS capsule, Take 1 capsule (8 mg total) by mouth daily with breakfast., Disp: 30 capsule, Rfl: 11   sulfamethoxazole -trimethoprim  (BACTRIM  DS) 800-160 MG tablet, Take 1 tablet by mouth 2 (two) times daily., Disp: 14 tablet, Rfl: 0 No current facility-administered medications for this visit.  Facility-Administered Medications Ordered in Other Visits:    0.9 %  sodium chloride  infusion, , Intravenous, Continuous, Han, Aimee H, PA-C   0.9 %  sodium chloride  infusion, , Intravenous, Continuous, Covington, Jamie R, NP, Last Rate: 40 mL/hr at 11/13/23 1210, New Bag at 11/13/23 1210   fentaNYL  (SUBLIMAZE ) injection, , Intravenous, PRN, Vanice Sharper, MD, 50 mcg at 11/13/23 1219   midazolam  (VERSED ) injection, , Intravenous, PRN, Vanice Sharper, MD, 1 mg at 11/13/23 1218    Physical Exam: There were no vitals taken for this visit.    Elderly white male No distress Lungs  clear No murmur Abdomen benign  New supr pubic catheter draining well  No edema  Labs:   Lab Results  Component Value Date   WBC 11.4 (H) 11/13/2023   HGB 12.6 (L) 11/13/2023   HCT 40.0 11/13/2023   MCV 90.7 11/13/2023   PLT 317 11/13/2023    Recent Labs  Lab 11/13/23 1040  NA 138  K 4.7  CL 100  CO2 26  BUN 22  CREATININE 1.41*  CALCIUM  9.4  GLUCOSE 116*   No results found for: CKTOTAL, CKMB, CKMBINDEX, TROPONINI  Lab Results  Component Value Date   CHOL 165 07/08/2022   CHOL 161 10/03/2018   Lab Results  Component Value Date   HDL 40 (L) 07/08/2022   HDL 42 10/03/2018   Lab Results  Component Value Date   LDLCALC 62 07/08/2022   LDLCALC 93 10/03/2018   LDLCALC 119 10/03/2017   Lab Results  Component Value Date   TRIG 313 (H) 07/08/2022   TRIG 165 (A) 10/03/2018   Lab Results  Component Value Date   CHOLHDL 4.1 07/08/2022   No results found for: LDLDIRECT    Radiology: No results found.  EKG:  none telemetry review by me atrial flutter rate 100-140 bpm   ASSESSMENT AND PLAN:   PAF:  with history of such and prior ablation. Asymptomatic post procedure. Start cardizem  240 mg daily and eliquis  5 mg bid. 14 day outpatient monitor. Will arrange f/u in a week or two with me or afib clinic . Has seen Dr Waddell EP in passed COPD:  stable no active wheezing Urology:  post suprapubic catheter f/u Dr Sherrilee  Signed: Maude Emmer 11/13/2023, 1:13 PM

## 2023-11-13 NOTE — Sedation Documentation (Signed)
 Dr Philip and Dr Delford at bedside.

## 2023-11-14 ENCOUNTER — Ambulatory Visit (HOSPITAL_COMMUNITY)

## 2023-11-14 ENCOUNTER — Inpatient Hospital Stay

## 2023-11-14 ENCOUNTER — Other Ambulatory Visit (HOSPITAL_COMMUNITY)

## 2023-11-19 ENCOUNTER — Other Ambulatory Visit: Admitting: Urology

## 2023-11-20 ENCOUNTER — Inpatient Hospital Stay: Admitting: Oncology

## 2023-11-21 ENCOUNTER — Inpatient Hospital Stay

## 2023-11-21 ENCOUNTER — Ambulatory Visit (HOSPITAL_COMMUNITY): Admission: RE | Admit: 2023-11-21 | Source: Ambulatory Visit

## 2023-11-26 ENCOUNTER — Telehealth: Payer: Self-pay | Admitting: Cardiovascular Disease

## 2023-11-26 NOTE — Telephone Encounter (Signed)
 Live zio- time 0112- auto triggered alert rapid afib 192- for 90 seconds     Cardiac Monitor Alert  Date of alert:  11/26/2023   Patient Name: James Miles  DOB: 18-Dec-1945  MRN: [id number]   [facility] Cardiologist: Maude Emmer, MD  [facility] EP:  Danelle Birmingham, MD    Monitor Information: Long Term Monitor-Live Telemetry [ZioAT]  Reason:  Atrial flutter Ordering provider:  Dr Emmer   Alert Atrial Fibrillation/Flutter-  time 0112- auto triggered alert rapid afib 192- for 90 seconds This is the 2nd alert for this rhythm.  The patient has a hx of Atrial Fibrillation/Flutter.    Anticoagulation medication as of 11/26/2023           apixaban  (ELIQUIS ) 5 MG TABS tablet Take 1 tablet (5 mg total) by mouth 2 (two) times daily.       The patient was contacted today.  He is asymptomatic. The patient was sleeping during the alert.  Pt had an Appt with the Afib clinic for 11/27/23, but asked to cancel it. Pt's son said that they are going to see his heart doctor in [place] the end of this week/beginning of next week.     [name], RN  11/26/2023 8:22 AM

## 2023-11-26 NOTE — Telephone Encounter (Signed)
 [facility] calling to give abnormal zio result

## 2023-11-27 ENCOUNTER — Ambulatory Visit (HOSPITAL_COMMUNITY): Admitting: Internal Medicine

## 2023-11-27 ENCOUNTER — Telehealth: Payer: Self-pay | Admitting: Cardiovascular Disease

## 2023-11-27 NOTE — Telephone Encounter (Signed)
 Caller James Miles) is reporting abnormal Zio results.

## 2023-11-27 NOTE — Telephone Encounter (Signed)
 Bernarda reports that pt had 2 episodes of rapid afib. First one at 181 for 80 seconds and the second one at 181 for 90 seconds. Both occurring this morning. Please advise.

## 2023-11-27 NOTE — Telephone Encounter (Signed)
 Called pt. No answer. Left msg to call back.

## 2023-11-27 NOTE — Telephone Encounter (Signed)
 Noted

## 2023-11-28 ENCOUNTER — Ambulatory Visit (HOSPITAL_COMMUNITY)
Admission: RE | Admit: 2023-11-28 | Discharge: 2023-11-28 | Disposition: A | Discharge status: Home / Self Care | Source: Ambulatory Visit | Attending: Hematology | Admitting: Hematology

## 2023-11-28 ENCOUNTER — Ambulatory Visit: Admitting: Oncology

## 2023-11-28 ENCOUNTER — Inpatient Hospital Stay: Attending: Oncology

## 2023-11-28 DIAGNOSIS — N323 Diverticulum of bladder: Secondary | ICD-10-CM | POA: Diagnosis not present

## 2023-11-28 DIAGNOSIS — I4892 Unspecified atrial flutter: Secondary | ICD-10-CM | POA: Diagnosis not present

## 2023-11-28 DIAGNOSIS — Z7901 Long term (current) use of anticoagulants: Secondary | ICD-10-CM | POA: Diagnosis not present

## 2023-11-28 DIAGNOSIS — D508 Other iron deficiency anemias: Secondary | ICD-10-CM

## 2023-11-28 DIAGNOSIS — Z9221 Personal history of antineoplastic chemotherapy: Secondary | ICD-10-CM | POA: Diagnosis not present

## 2023-11-28 DIAGNOSIS — R935 Abnormal findings on diagnostic imaging of other abdominal regions, including retroperitoneum: Secondary | ICD-10-CM | POA: Diagnosis not present

## 2023-11-28 DIAGNOSIS — Z87891 Personal history of nicotine dependence: Secondary | ICD-10-CM | POA: Diagnosis not present

## 2023-11-28 DIAGNOSIS — D4122 Neoplasm of uncertain behavior of left ureter: Secondary | ICD-10-CM | POA: Insufficient documentation

## 2023-11-28 DIAGNOSIS — Z923 Personal history of irradiation: Secondary | ICD-10-CM | POA: Insufficient documentation

## 2023-11-28 DIAGNOSIS — C20 Malignant neoplasm of rectum: Secondary | ICD-10-CM | POA: Diagnosis not present

## 2023-11-28 DIAGNOSIS — K6389 Other specified diseases of intestine: Secondary | ICD-10-CM | POA: Diagnosis not present

## 2023-11-28 LAB — CBC WITH DIFFERENTIAL/PLATELET
Abs Immature Granulocytes: 0.05 K/uL (ref 0.00–0.07)
Basophils Absolute: 0 K/uL (ref 0.0–0.1)
Basophils Relative: 0 %
Eosinophils Absolute: 0.1 K/uL (ref 0.0–0.5)
Eosinophils Relative: 1 %
HCT: 35.1 % — ABNORMAL LOW (ref 39.0–52.0)
Hemoglobin: 11.2 g/dL — ABNORMAL LOW (ref 13.0–17.0)
Immature Granulocytes: 0 %
Lymphocytes Relative: 8 %
Lymphs Abs: 1.2 K/uL (ref 0.7–4.0)
MCH: 29.1 pg (ref 26.0–34.0)
MCHC: 31.9 g/dL (ref 30.0–36.0)
MCV: 91.2 fL (ref 80.0–100.0)
Monocytes Absolute: 1 K/uL (ref 0.1–1.0)
Monocytes Relative: 6 %
Neutro Abs: 13.2 K/uL — ABNORMAL HIGH (ref 1.7–7.7)
Neutrophils Relative %: 85 %
Platelets: 271 K/uL (ref 150–400)
RBC: 3.85 MIL/uL — ABNORMAL LOW (ref 4.22–5.81)
RDW: 13.7 % (ref 11.5–15.5)
WBC: 15.6 K/uL — ABNORMAL HIGH (ref 4.0–10.5)
nRBC: 0 % (ref 0.0–0.2)

## 2023-11-28 LAB — COMPREHENSIVE METABOLIC PANEL WITH GFR
ALT: 7 U/L (ref 0–44)
AST: 12 U/L — ABNORMAL LOW (ref 15–41)
Albumin: 4 g/dL (ref 3.5–5.0)
Alkaline Phosphatase: 73 U/L (ref 38–126)
Anion gap: 13 (ref 5–15)
BUN: 23 mg/dL (ref 8–23)
CO2: 26 mmol/L (ref 22–32)
Calcium: 9.2 mg/dL (ref 8.9–10.3)
Chloride: 97 mmol/L — ABNORMAL LOW (ref 98–111)
Creatinine, Ser: 1.15 mg/dL (ref 0.61–1.24)
GFR, Estimated: >60 mL/min (ref >60)
Glucose, Bld: 93 mg/dL (ref 70–99)
Potassium: 4.8 mmol/L (ref 3.5–5.1)
Sodium: 136 mmol/L (ref 135–145)
Total Bilirubin: 0.4 mg/dL (ref 0.0–1.2)
Total Protein: 7.4 g/dL (ref 6.5–8.1)

## 2023-11-28 LAB — IRON AND TIBC
Iron: 14 ug/dL — ABNORMAL LOW (ref 45–182)
Saturation Ratios: 6 % — ABNORMAL LOW (ref 17.9–39.5)
TIBC: 239 ug/dL — ABNORMAL LOW (ref 250–450)
UIBC: 225 ug/dL

## 2023-11-28 LAB — FERRITIN: Ferritin: 192 ng/mL (ref 24–336)

## 2023-11-28 NOTE — Patient Instructions (Signed)
 [facility] [place] - A DEPT OF [facility]  Discharge Instructions: Thank you for choosing [facility] to provide your oncology and hematology care.  If you have a lab appointment with the Cancer Center - please note that after April 8th, 2024, all labs will be drawn in the cancer center.  You do not have to check in or register with the main entrance as you have in the past but will complete your check-in in the cancer center.  Wear comfortable clothing and clothing appropriate for easy access to any Portacath or PICC line.   We strive to give you quality time with your provider. You may need to reschedule your appointment if you arrive late (15 or more minutes).  Arriving late affects you and other patients whose appointments are after yours.  Also, if you miss three or more appointments without notifying the office, you may be dismissed from the clinic at the provider's discretion.      For prescription refill requests, have your pharmacy contact our office and allow 72 hours for refills to be completed.    Today you received the following chemotherapy and/or immunotherapy agents port flush lab      To help prevent nausea and vomiting after your treatment, we encourage you to take your nausea medication as directed.  BELOW ARE SYMPTOMS THAT SHOULD BE REPORTED IMMEDIATELY: *FEVER GREATER THAN 100.4 F (38 C) OR HIGHER *CHILLS OR SWEATING *NAUSEA AND VOMITING THAT IS NOT CONTROLLED WITH YOUR NAUSEA MEDICATION *UNUSUAL SHORTNESS OF BREATH *UNUSUAL BRUISING OR BLEEDING *URINARY PROBLEMS (pain or burning when urinating, or frequent urination) *BOWEL PROBLEMS (unusual diarrhea, constipation, pain near the anus) TENDERNESS IN MOUTH AND THROAT WITH OR WITHOUT PRESENCE OF ULCERS (sore throat, sores in mouth, or a toothache) UNUSUAL RASH, SWELLING OR PAIN  UNUSUAL VAGINAL DISCHARGE OR ITCHING   Items with * indicate a potential emergency and should be  followed up as soon as possible or go to the Emergency Department if any problems should occur.  Please show the CHEMOTHERAPY ALERT CARD or IMMUNOTHERAPY ALERT CARD at check-in to the Emergency Department and triage nurse.  Should you have questions after your visit or need to cancel or reschedule your appointment, please contact [facility] [place] - A DEPT OF [facility] [phone]  and follow the prompts.  Office hours are 8:00 a.m. to 4:30 p.m. Monday - Friday. Please note that voicemails left after 4:00 p.m. may not be returned until the following business day.  We are closed weekends and major holidays. You have access to a nurse at all times for urgent questions. Please call the main number to the clinic [phone] and follow the prompts.  For any non-urgent questions, you may also contact your provider using MyChart. We now offer e-Visits for anyone 1 and older to request care online for non-urgent symptoms. For details visit [email or web address].   Also download the MyChart app! Go to the app store, search "MyChart", open the app, select [place], and log in with your MyChart username and password.

## 2023-11-28 NOTE — Progress Notes (Signed)
 Patients port flushed without difficulty.  Good blood return noted with no bruising or swelling noted at site.  Band aid applied.  VSS with discharge and left in satisfactory condition with no s/s of distress noted.

## 2023-11-28 NOTE — Progress Notes (Signed)
   11/28/23 1400  Spiritual Encounters  Type of Visit Initial  Care provided to: Patient;Family (Son)  Conversation partners present during Programmer, systems  Reason for visit Routine spiritual support   Reason for Visit: Chaplain making rounds and identified Pt I had not connected with yet and visited to deliver introduction to Spiritual Care  Description of Visit: Upon arrival I found James Miles seated in wheelchair awaiting port flush, and his son was there as a support person. I noticed Sundance's hat which said [place] Vet on it and I stopped to thank him for his service.  We spoke for a few minutes about his service and my own.  I introduced myself as the chaplain for the cancer center and offered a brief education on the role of a chaplain and the support we can offer to our patients, caregivers, and staff.    Plan of Care: I will continue to follow up with James Miles on a monthly basis.   Maude Roll, MDiv  Chaplain, [facility] [email or web address] [phone]

## 2023-11-29 LAB — CEA: CEA: 1.6 ng/mL (ref 0.0–4.7)

## 2023-12-03 ENCOUNTER — Ambulatory Visit: Payer: Self-pay | Admitting: Oncology

## 2023-12-04 ENCOUNTER — Ambulatory Visit

## 2023-12-06 NOTE — Addendum Note (Signed)
 Encounter addended by: Loy Little N on: 12/06/2023 4:34 PM  Actions taken: Imaging Exam ended

## 2023-12-07 ENCOUNTER — Inpatient Hospital Stay: Admitting: Oncology

## 2023-12-07 DIAGNOSIS — I4891 Unspecified atrial fibrillation: Secondary | ICD-10-CM | POA: Diagnosis not present

## 2023-12-10 ENCOUNTER — Ambulatory Visit: Payer: Self-pay | Admitting: Student

## 2023-12-10 ENCOUNTER — Ambulatory Visit

## 2023-12-10 DIAGNOSIS — R339 Retention of urine, unspecified: Secondary | ICD-10-CM | POA: Diagnosis not present

## 2023-12-10 MED ORDER — CIPROFLOXACIN HCL 500 MG PO TABS: 500.0000 mg | ORAL_TABLET | Freq: Once | ORAL | Status: AC

## 2023-12-10 NOTE — Progress Notes (Addendum)
 Suprapubic Cath Change  Patient is present today for a suprapubic catheter change due to urinary retention.  10 ml of water  was drained from the balloon, a 16 FR foley cath was removed from the tract with out difficulty.  Suprapubic catheter site was cleaned and prepped in a sterile fashion with Hibiclens -Alcoholx3  A 16 FR foley cath was replaced into the tract no complications were noted. Flush return was noted, urine Clear yellow in color . 10 ml of sterile water  was inflated into the balloon and a night bag was attached for drainage.  Patient tolerated well. A night bag was given to patient and proper instruction was given on how to switch bags.    Performed by: Carlos, CMA  Follow up: Keep monthly SP Tube change

## 2023-12-11 ENCOUNTER — Other Ambulatory Visit: Payer: Self-pay

## 2023-12-11 ENCOUNTER — Encounter (HOSPITAL_COMMUNITY)
Admission: RE | Admit: 2023-12-11 | Discharge: 2023-12-11 | Disposition: A | Discharge status: Home / Self Care | Source: Ambulatory Visit | Attending: Urology | Admitting: Urology

## 2023-12-13 ENCOUNTER — Encounter (INDEPENDENT_AMBULATORY_CARE_PROVIDER_SITE_OTHER): Payer: Self-pay | Admitting: Gastroenterology

## 2023-12-13 ENCOUNTER — Inpatient Hospital Stay: Attending: Oncology | Admitting: Oncology

## 2023-12-13 VITALS — BP 137/71 | HR 70 | Temp 97.5°F | Resp 18 | Wt 155.0 lb

## 2023-12-13 DIAGNOSIS — D508 Other iron deficiency anemias: Secondary | ICD-10-CM | POA: Diagnosis not present

## 2023-12-13 DIAGNOSIS — C20 Malignant neoplasm of rectum: Secondary | ICD-10-CM

## 2023-12-13 NOTE — Progress Notes (Signed)
 Patient Care Team: [name], Kalombo Cesar, NP as PCP - General [name], MD as PCP - Cardiology (Cardiology) [name], MD as PCP - Electrophysiology (Cardiology) [name], RN as Oncology Nurse Navigator (Medical Oncology)  Clinic Day:  12/14/2023  Referring physician: Benjamin Raina Sheets*   CHIEF COMPLAINT:  CC: Stage II (T3 N0 M0) low rectal adenocarcinoma, MMR preserved   James Miles 78 y.o. male was transferred to my care after his prior physician has left.   ASSESSMENT & PLAN:   Assessment & Plan: James Miles  is a 78 y.o. male with rectal adenocarcinoma  Assessment and Plan Assessment & Plan Malignant neoplasm of rectum Stage II (T3N0M0). Extensive oncology history below S/p TNT but patient did not undergo surgery due to complications with the ureteral and bladder masses that are not malignant  -We reviewed the recent MRI findings together.  It was difficult to assess because of no dilatation with retention.  Will repeat MRI in 3 months with rectal gel -Last CT CAP from 08/2023 showed no evidence of disease.  Will repeat CT scan in three months. -Labs reviewed: CEA: 1.6.  Will repeat every 3 months. - Last sigmoidoscopy was in 07/2023.  Needs a repeat in December.  Will reach out to Dr. Ivonne office.  Return to clinic in 3 months with above imaging.  Iron deficiency anemia due to chronic blood loss Severe iron deficiency anemia likely secondary to chronic blood loss. IV iron therapy planned to address low iron levels. Discussed common side effects of IV iron, including allergic reactions, nausea, headache, and brown discoloration.  Premedicationwill be provided to mitigate these side effects. Over-the-counter iron supplements not recommended due to potential constipation. - Administer IV iron infusion. - Provide medications to prevent side effects of IV iron.  Hematuria with indwelling urinary catheter Hematuria with blood clots  noted in urine, possibly related to indwelling urinary catheter. Recent urology visit confirmed presence of blood clots. Catheter requires monthly replacement. - Continue monthly catheter replacement. - Continue to follow with urology   The patient understands the plans discussed today and is in agreement with them.  He knows to contact our office if he develops concerns prior to his next appointment.  30 minutes of total time was spent for this patient encounter, including preparation,review of records,  face-to-face counseling with the patient and coordination of care, physical exam, and documentation of the encounter.    James Miles,acting as a neurosurgeon for James Dry, MD.,have documented all relevant documentation on the behalf of James Dry, MD,as directed by  James Dry, MD while in the presence of James Dry, MD.  I, James Dry MD, have reviewed the above documentation for accuracy and completeness, and I agree with the above.    James Dry, MD  [facility] - A DEPT OF [facility] [address] [place] [address] Dept: [phone] Dept Fax: [phone]   Orders Placed This Encounter  Procedures   CT CHEST ABDOMEN PELVIS W CONTRAST    Standing Status:   Future    Expected Date:   02/12/2024    Expiration Date:   12/12/2024    If indicated for the ordered procedure, I authorize the administration of contrast media per Radiology protocol:   Yes    Does the patient have a contrast media/X-ray dye allergy?:   No    Preferred imaging location?:   [facility]  If indicated for the ordered procedure, I authorize the administration of oral contrast media per Radiology protocol:   Yes   MR PELVIS WO CM RECTAL CA STAGING    Rectal gel    Standing Status:   Future    Expected Date:   02/12/2024    Expiration Date:   12/12/2024    If indicated for the ordered procedure, I  authorize the administration of contrast media per Radiology protocol:   Yes    What is the patient's sedation requirement?:   No Sedation    Does the patient have a pacemaker or implanted devices?:   No    Preferred imaging location?:   [facility] (table limit - 500lbs)   CBC with Differential    Standing Status:   Future    Expected Date:   02/12/2024    Expiration Date:   05/12/2024   Comprehensive metabolic panel    Standing Status:   Future    Expected Date:   02/12/2024    Expiration Date:   05/12/2024   CEA    Standing Status:   Future    Expected Date:   02/12/2024    Expiration Date:   05/12/2024   Iron and TIBC ([facility] ONLY)    Standing Status:   Future    Expected Date:   02/12/2024    Expiration Date:   05/12/2024   Ferritin    Standing Status:   Future    Expected Date:   02/12/2024    Expiration Date:   05/12/2024   Vitamin B12    Standing Status:   Future    Expected Date:   02/12/2024    Expiration Date:   05/12/2024   Folate    Standing Status:   Future    Expected Date:   02/12/2024    Expiration Date:   05/12/2024     ONCOLOGY HISTORY:   I have reviewed his chart and materials related to his cancer extensively and collaborated history with the patient. Summary of oncologic history is as follows:   Diagnosis: Stage II (T3 N0 M0) low rectal adenocarcinoma, MMR preserved   -Presentation: Intermittent rectal bleeding for 6 months -04/26/2022: Colonoscopy found an exophytic semilunar neoplastic appearing process beginning at the anal verge and extending proximally about 6 cm.  Pathology: Invasive moderately differentiated adenocarcinoma. MMR preserved.  -04/26/2022: CEA 2.6 -05/01/2022: CT CAP: Nodular wall thickening along the rectum consistent with the history of known mass. Adjacent vascular engorgement. No intrinsic abnormal lymph node enlargement. No liver lesion. Fatty liver infiltration. Nodule along the left side of the urinary bladder. In addition  there is small mass along the course of the distal left ureter. This is worrisome for overall multifocal TCC.  Small lung nodules measuring up to 4 mm.   -05/03/2022: MRI Staging Pelvis: 6.4 cm left lateral low rectal tumor abutting the internal anal sphincter. Distance from tumor to the internal anal sphincter is 0 cm. Staging: T3c N0.  -05/11/2022: Initial PET: Hypermetabolic rectal mass, consistent with primary rectal carcinoma. 5 mm left posterior perirectal lymph node shows no FDG uptake, but is too small to accurately characterize by PET. No other evidence of metastatic disease. -05/16/2022: Patient evaluated by Dr. Debby kansky surgeon] who recommended TNT and discussed APR with colostomy -06/07/2022-09/25/2022: 8 cycles of FOLFOX  -12/28/2022-02/14/2023: ChemoRT with Xeloda  1500 mg twice daily  -Patient under surveillance with scans and sigmoidoscopes. He did not undergo surgery due to left urinary bladder and  distal left ureteral masses that were negative for cancer and requiring foley catheter.  -02/2023-11/2023: NED per imaging. CEA stable to improved.  -07/16/2023: Sigmoidoscopy with rectal biopsy negative for carcinoma.  -11/28/2023: MRI Staging Pelvis: Asymmetric wall thickening of the right lateral rectal wall raises the concern for possible recurrent malignancy versus artifactual due to under distention. Findings are indeterminate. No new lymphadenopathy or evidence of extra colonic metastasis within the field of view.  Current Treatment:  Surveillance  INTERVAL HISTORY:  Discussed the use of AI scribe software for clinical note transcription with the patient, who gave verbal consent to proceed.  History of Present Illness James Miles is a 78 year old male with rectal cancer who presents to establish care with me for rectal adenocarcinoma.   He has a history of rectal cancer and recently underwent an MRI that showed some thickness in the rectum. The imaging was inconclusive  due to the lack of rectal gel distension.  He experienced significant bleeding in his catheter and recently had a suprapubic catheter placed.  He had this flushed out recently that had several blood clots. He undergoes monthly catheter changes at this time.  No blood in stools, but blood is present in urine.  He reports some fatigue but overall feels okay.   I have reviewed the past medical history, past surgical history, social history and family history with the patient and they are unchanged from previous note.  ALLERGIES:  has no known allergies.  MEDICATIONS:  Current Outpatient Medications  Medication Sig Dispense Refill   febuxostat  (ULORIC ) 40 MG tablet Take 1 tablet (40 mg total) by mouth daily. (Patient taking differently: Take 40 mg by mouth daily as needed (gout flare).) 90 tablet 1   phenazopyridine  (PYRIDIUM ) 95 MG tablet Take 95 mg by mouth 3 (three) times daily as needed for pain. (Patient taking differently: Take 95 mg by mouth 3 (three) times daily as needed for pain. Taking OTC since surgery for catheter insertion)     albuterol  (VENTOLIN  HFA) 108 (90 Base) MCG/ACT inhaler Inhale 2 puffs into the lungs every 6 (six) hours as needed for wheezing or shortness of breath.     apixaban  (ELIQUIS ) 5 MG TABS tablet Take 1 tablet (5 mg total) by mouth 2 (two) times daily. 60 tablet 11   calcium  carbonate (TUMS - DOSED IN MG ELEMENTAL CALCIUM ) 500 MG chewable tablet Chew 1 tablet by mouth daily as needed for indigestion or heartburn.     diltiazem  (CARDIZEM  CD) 240 MG 24 hr capsule Take 1 capsule (240 mg total) by mouth daily. 30 capsule 11   DM-Phenylephrine -Acetaminophen  (MUCINEX FAST-MAX CONG HEADACHE PO) Take 1-2 tablets by mouth daily as needed (congestion).     fesoterodine  (TOVIAZ ) 8 MG TB24 tablet Take 1 tablet (8 mg total) by mouth daily. 30 tablet 11   finasteride  (PROSCAR ) 5 MG tablet Take 1 tablet (5 mg total) by mouth daily. 90 tablet 3   furosemide  (LASIX ) 20 MG tablet  Take 1 tablet (20 mg total) by mouth daily as needed. (Patient not taking: Reported on 12/13/2023) 30 tablet 2   GAVILYTE-G 236 g solution Take 4,000 mLs by mouth once. (Patient not taking: Reported on 12/13/2023)     HYDROcodone -acetaminophen  (NORCO/VICODIN) 5-325 MG tablet Take 1 tablet by mouth every 6 (six) hours as needed for moderate pain (pain score 4-6). (Patient not taking: Reported on 12/13/2023) 15 tablet 0   levofloxacin  (LEVAQUIN ) 500 MG tablet Take 1 tablet (500 mg total) by mouth  daily. (Patient not taking: Reported on 12/13/2023) 10 tablet 0   lidocaine -prilocaine  (EMLA ) cream Apply 1 Application topically. (Patient not taking: Reported on 12/13/2023)     loperamide  (IMODIUM ) 2 MG capsule Take 2 mg by mouth 3 (three) times daily as needed for diarrhea or loose stools. (Patient not taking: Reported on 12/13/2023)     MELATONIN PO Take 1 tablet by mouth at bedtime as needed (sleep).     mirabegron  ER (MYRBETRIQ ) 25 MG TB24 tablet Take 1 tablet (25 mg total) by mouth daily. 30 tablet 11   Misc. Devices MISC Please provide with shower chair 1 Units 0   naproxen sodium (ALEVE) 220 MG tablet Take 220 mg by mouth daily as needed (pain).     prochlorperazine  (COMPAZINE ) 10 MG tablet Take 10 mg by mouth every 6 (six) hours as needed for nausea or vomiting. (Patient not taking: Reported on 12/13/2023)     silodosin  (RAPAFLO ) 8 MG CAPS capsule Take 1 capsule (8 mg total) by mouth daily with breakfast. (Patient not taking: Reported on 12/13/2023) 30 capsule 11   sulfamethoxazole -trimethoprim  (BACTRIM  DS) 800-160 MG tablet Take 1 tablet by mouth 2 (two) times daily. (Patient not taking: Reported on 12/13/2023) 14 tablet 0   Current Facility-Administered Medications  Medication Dose Route Frequency Provider Last Rate Last Admin   ciprofloxacin  (CIPRO ) tablet 500 mg  500 mg Oral Once Nieves Cough, MD        REVIEW OF SYSTEMS:   Constitutional: Denies fevers, chills or abnormal weight loss Eyes:  Denies blurriness of vision Ears, nose, mouth, throat, and face: Denies mucositis or sore throat Respiratory: Denies cough, dyspnea or wheezes Cardiovascular: Denies palpitation, chest discomfort or lower extremity swelling Gastrointestinal:  Denies nausea, heartburn or change in bowel habits Skin: Denies abnormal skin rashes Lymphatics: Denies new lymphadenopathy or easy bruising Neurological:Denies numbness, tingling or new weaknesses Behavioral/Psych: Mood is stable, no new changes  All other systems were reviewed with the patient and are negative.   VITALS:  Blood pressure 137/71, pulse 70, temperature (!) 97.5 F (36.4 C), temperature source Tympanic, resp. rate 18, weight 155 lb (70.3 kg), SpO2 100%.  Wt Readings from Last 3 Encounters:  12/13/23 155 lb (70.3 kg)  11/13/23 160 lb (72.6 kg)  10/18/23 165 lb 9.6 oz (75.1 kg)    Body mass index is 21.62 kg/m.  Performance status (ECOG): 2 - Symptomatic, <50% confined to bed  PHYSICAL EXAM:   GENERAL: Frail male in a wheelchair  SKIN: skin color, texture, turgor are normal, no rashes or significant lesions LYMPH:  no palpable lymphadenopathy in the cervical, axillary or inguinal LUNGS: clear to auscultation and percussion with normal breathing effort HEART: regular rate & rhythm and no murmurs and no lower extremity edema ABDOMEN:abdomen soft, non-tender and normal bowel sounds, suprapubic catheter in place with red-colored urine Musculoskeletal:no cyanosis of digits and no clubbing  NEURO: alert & oriented x 3 with fluent speech  LABORATORY DATA:  I have reviewed the data as listed   Lab Results  Component Value Date   WBC 15.6 (H) 11/28/2023   NEUTROABS 13.2 (H) 11/28/2023   HGB 11.2 (L) 11/28/2023   HCT 35.1 (L) 11/28/2023   MCV 91.2 11/28/2023   PLT 271 11/28/2023      Chemistry      Component Value Date/Time   NA 136 11/28/2023 1347   NA 142 10/03/2018 0000   K 4.8 11/28/2023 1347   CL 97 (L) 11/28/2023  1347   CO2  26 11/28/2023 1347   BUN 23 11/28/2023 1347   BUN 19 10/03/2018 0000   CREATININE 1.15 11/28/2023 1347   CREATININE 0.73 04/10/2019 0857   GLU 144 10/03/2018 0000      Component Value Date/Time   CALCIUM  9.2 11/28/2023 1347   ALKPHOS 73 11/28/2023 1347   AST 12 (L) 11/28/2023 1347   ALT 7 11/28/2023 1347   BILITOT 0.4 11/28/2023 1347       Latest Reference Range & Units 11/28/23 13:47  Iron 45 - 182 ug/dL 14 (L)  UIBC ug/dL 774  TIBC 749 - 549 ug/dL 760 (L)  Saturation Ratios 17.9 - 39.5 % 6 (L)  Ferritin 24 - 336 ng/mL 192  (L): Data is abnormally low  RADIOGRAPHIC STUDIES: I have personally reviewed the radiological images as listed and agreed with the findings in the report.  CLINICAL DATA:  Rectal cancer   EXAM: MRI PELVIS WITHOUT CONTRAST   TECHNIQUE: Multiplanar multisequence MR imaging of the pelvis was performed. No intravenous contrast was administered.   COMPARISON:  CT chest abdomen pelvis August 13, 2023. MR pelvis Jun 11, 2023.   FINDINGS: Urinary Tract: Bladder contains a suprapubic catheter and demonstrates trabeculated wall. Layering sludge within the bladder lumen. Subcentimeter bladder diverticula (4/30, 34). No bladder mass/polyp identified.   Bowel: Suboptimal evaluation due to under distended rectum (lack of rectal gel and IV contrast).   There is asymmetric wall thickening of the rectum. Posttreatment changes along the left anterolateral wall of the rectum site of known treated malignancy with T2 hypointensity consistent with treated disease (12 to 8 o'clock position), stable to prior.   There is a short-segment semi circumferential wall thickening involving the contralateral right lateral wall of the rectum concerning for recurrent malignancy, although inadequate rectal distension may contribute to this thickening.   Evaluation is suboptimal given the lack of rectal gel and contrast injection. Presumable asymmetric wall  thickening involves the 5 to 12 o'clock position (5/26) and spans about 5.9 cm extending to the anal verge.   No suspicious new lymphadenopathy. Osseous structures: No suspicious osseous finding. Multilevel degenerative changes of the spine.   Others: Small bilateral fat containing inguinal hernias. No pelvic fluid.   IMPRESSION: Asymmetric wall thickening of the right lateral rectal wall raises the concern for possible recurrent malignancy versus artifactual due to under distention. Findings are indeterminate. Recommend correlation with tumor markers, physical exam and dedicated MR study with adequate preparation/rectal distension (rectal gel) and contrast injection for further assessment.   Posttreatment changes along the left lateral rectal wall, site of known treated malignancy, stable to prior.   No new lymphadenopathy or evidence of extra colonic metastasis within the field of view.     Electronically Signed   By: Megan  Zare M.D.   On: 11/30/2023 13:55

## 2023-12-13 NOTE — Patient Instructions (Signed)
 [facility] at [facility] Discharge Instructions   You were seen and examined today by Dr. Davonna.  She reviewed the results of your lab work which are mostly normal/stable. Your iron is low. We will arrange for you to have iron infusions.   We will see you back in 2 months. We will repeat a CT scan and and MRI prior to next visit.    Return as scheduled.    Thank you for choosing  Cancer Center at [facility] to provide your oncology and hematology care.  To afford each patient quality time with our provider, please arrive at least 15 minutes before your scheduled appointment time.   If you have a lab appointment with the Cancer Center please come in thru the Main Entrance and check in at the main information desk.  You need to re-schedule your appointment should you arrive 10 or more minutes late.  We strive to give you quality time with our providers, and arriving late affects you and other patients whose appointments are after yours.  Also, if you no show three or more times for appointments you may be dismissed from the clinic at the providers discretion.     Again, thank you for choosing [facility]  Our hope is that these requests will decrease the amount of time that you wait before being seen by our physicians.       _____________________________________________________________  Should you have questions after your visit to [facility], please contact our office at [phone] and follow the prompts.  Our office hours are 8:00 a.m. and 4:30 p.m. Monday - Friday.  Please note that voicemails left after 4:00 p.m. may not be returned until the following business day.  We are closed weekends and major holidays.  You do have access to a nurse 24-7, just call the main number to the clinic [phone] and do not press any options, hold on the line and a nurse will answer the phone.    For prescription refill requests,  have your pharmacy contact our office and allow 72 hours.    Due to Covid, you will need to wear a mask upon entering the hospital. If you do not have a mask, a mask will be given to you at the Main Entrance upon arrival. For doctor visits, patients may have 1 support person age 31 or older with them. For treatment visits, patients can not have anyone with them due to social distancing guidelines and our immunocompromised population.

## 2023-12-14 ENCOUNTER — Encounter: Payer: Self-pay | Admitting: Oncology

## 2023-12-14 ENCOUNTER — Other Ambulatory Visit: Payer: Self-pay

## 2023-12-17 ENCOUNTER — Encounter: Payer: Self-pay | Admitting: Internal Medicine

## 2023-12-19 ENCOUNTER — Ambulatory Visit: Admitting: Gastroenterology

## 2023-12-21 ENCOUNTER — Ambulatory Visit: Admitting: Cardiovascular Disease

## 2023-12-26 ENCOUNTER — Ambulatory Visit: Admitting: Urology

## 2024-01-08 ENCOUNTER — Encounter (HOSPITAL_COMMUNITY)
Admission: RE | Admit: 2024-01-08 | Discharge: 2024-01-08 | Disposition: A | Source: Ambulatory Visit | Attending: Urology

## 2024-01-08 HISTORY — DX: Other cystostomy status: Z93.59

## 2024-01-09 ENCOUNTER — Other Ambulatory Visit: Payer: Self-pay

## 2024-01-09 ENCOUNTER — Encounter (HOSPITAL_COMMUNITY): Payer: Self-pay

## 2024-01-09 NOTE — Pre-Procedure Instructions (Signed)
 Pre-op phone call done with son, Dorise. He verbalizes understanding of all instructions.

## 2024-01-10 ENCOUNTER — Encounter (HOSPITAL_COMMUNITY)
Admission: RE | Disposition: A | Discharge status: Home / Self Care | Payer: Self-pay | Source: Home / Self Care | Attending: Urology

## 2024-01-10 ENCOUNTER — Ambulatory Visit (HOSPITAL_COMMUNITY): Admission: RE | Admit: 2024-01-10 | Discharge: 2024-01-10 | Disposition: A | Attending: Urology | Admitting: Urology

## 2024-01-10 ENCOUNTER — Ambulatory Visit (HOSPITAL_COMMUNITY): Admitting: Anesthesiology

## 2024-01-10 ENCOUNTER — Encounter (HOSPITAL_COMMUNITY): Admitting: Anesthesiology

## 2024-01-10 ENCOUNTER — Other Ambulatory Visit (HOSPITAL_COMMUNITY)

## 2024-01-10 ENCOUNTER — Encounter (HOSPITAL_COMMUNITY): Payer: Self-pay | Admitting: Urology

## 2024-01-10 DIAGNOSIS — N319 Neuromuscular dysfunction of bladder, unspecified: Secondary | ICD-10-CM | POA: Diagnosis not present

## 2024-01-10 DIAGNOSIS — I480 Paroxysmal atrial fibrillation: Secondary | ICD-10-CM

## 2024-01-10 HISTORY — PX: CYSTOSCOPY WITH INJECTION: SHX1424

## 2024-01-10 SURGERY — CYSTOSCOPY, WITH INJECTION OF BLADDER NECK OR BLADDER WALL
Anesthesia: Monitor Anesthesia Care | Site: Bladder

## 2024-01-10 MED ORDER — CEFAZOLIN SODIUM-DEXTROSE 2-4 GM/100ML-% IV SOLN
INTRAVENOUS | Status: AC
  Filled 2024-01-10: qty 100

## 2024-01-10 MED ORDER — ONABOTULINUMTOXINA 100 UNITS IJ SOLR
INTRAMUSCULAR | Status: DC | PRN
  Administered 2024-01-10: 100 [IU] via INTRAMUSCULAR

## 2024-01-10 MED ORDER — WATER FOR IRRIGATION, STERILE IR SOLN
Status: DC | PRN
  Administered 2024-01-10: 500 mL
  Administered 2024-01-10: 3000 mL

## 2024-01-10 MED ORDER — FENTANYL CITRATE (PF) 100 MCG/2ML IJ SOLN
INTRAMUSCULAR | Status: DC | PRN
  Administered 2024-01-10: 25 ug via INTRAVENOUS

## 2024-01-10 MED ORDER — LACTATED RINGERS IV SOLN: INTRAVENOUS | Status: DC

## 2024-01-10 MED ORDER — IPRATROPIUM-ALBUTEROL 0.5-2.5 (3) MG/3ML IN SOLN
RESPIRATORY_TRACT | Status: DC
  Filled 2024-01-10: qty 3

## 2024-01-10 MED ORDER — CHLORHEXIDINE GLUCONATE 0.12 % MT SOLN
15.0000 mL | Freq: Once | OROMUCOSAL | Status: AC
  Administered 2024-01-10: 15 mL via OROMUCOSAL

## 2024-01-10 MED ORDER — PROPOFOL 500 MG/50ML IV EMUL
INTRAVENOUS | Status: AC
  Filled 2024-01-10: qty 50

## 2024-01-10 MED ORDER — SODIUM CHLORIDE (PF) 0.9 % IJ SOLN
INTRAMUSCULAR | Status: DC | PRN
  Administered 2024-01-10: 20 mL via INTRAVENOUS

## 2024-01-10 MED ORDER — SODIUM CHLORIDE (PF) 0.9 % IJ SOLN
INTRAMUSCULAR | Status: AC
  Filled 2024-01-10: qty 20

## 2024-01-10 MED ORDER — OXYCODONE HCL 5 MG PO TABS: 5.0000 mg | ORAL_TABLET | Freq: Once | ORAL | Status: DC | PRN

## 2024-01-10 MED ORDER — OXYCODONE HCL 5 MG/5ML PO SOLN: 5.0000 mg | Freq: Once | ORAL | Status: DC | PRN

## 2024-01-10 MED ORDER — ONABOTULINUMTOXINA 100 UNITS IJ SOLR
100.0000 [IU] | Freq: Once | INTRAMUSCULAR | Status: DC
  Filled 2024-01-10: qty 100

## 2024-01-10 MED ORDER — IPRATROPIUM-ALBUTEROL 0.5-2.5 (3) MG/3ML IN SOLN
3.0000 mL | Freq: Once | RESPIRATORY_TRACT | Status: AC
  Administered 2024-01-10: 3 mL via RESPIRATORY_TRACT

## 2024-01-10 MED ORDER — ORAL CARE MOUTH RINSE: 15.0000 mL | Freq: Once | OROMUCOSAL | Status: DC

## 2024-01-10 MED ORDER — LIDOCAINE HCL URETHRAL/MUCOSAL 2 % EX GEL
CUTANEOUS | Status: AC
  Filled 2024-01-10: qty 10

## 2024-01-10 MED ORDER — FENTANYL CITRATE (PF) 50 MCG/ML IJ SOSY: 25.0000 ug | PREFILLED_SYRINGE | INTRAMUSCULAR | Status: DC | PRN

## 2024-01-10 MED ORDER — CEFAZOLIN SODIUM-DEXTROSE 2-4 GM/100ML-% IV SOLN
2.0000 g | INTRAVENOUS | Status: AC
  Administered 2024-01-10: 2 g via INTRAVENOUS

## 2024-01-10 MED ORDER — FENTANYL CITRATE (PF) 100 MCG/2ML IJ SOLN
INTRAMUSCULAR | Status: AC
  Filled 2024-01-10: qty 2

## 2024-01-10 MED ORDER — PROPOFOL 10 MG/ML IV BOLUS
INTRAVENOUS | Status: DC | PRN
  Administered 2024-01-10: 30 mg via INTRAVENOUS
  Administered 2024-01-10: 50 ug/kg/min via INTRAVENOUS
  Administered 2024-01-10: 20 mg via INTRAVENOUS
  Administered 2024-01-10: 40 mg via INTRAVENOUS

## 2024-01-10 MED ORDER — CHLORHEXIDINE GLUCONATE 0.12 % MT SOLN: 15.0000 mL | Freq: Once | OROMUCOSAL | Status: DC

## 2024-01-10 SURGICAL SUPPLY — 19 items
BAG DRAIN URO TABLE W/ADPT NS (BAG) ×1 IMPLANT
BAG HAMPER (MISCELLANEOUS) ×1 IMPLANT
CLOTH BEACON ORANGE TIMEOUT ST (SAFETY) ×1 IMPLANT
GLOVE BIO SURGEON STRL SZ8 (GLOVE) ×1 IMPLANT
GLOVE BIOGEL PI IND STRL 7.0 (GLOVE) ×2 IMPLANT
GOWN STRL REUS W/TWL LRG LVL3 (GOWN DISPOSABLE) ×1 IMPLANT
GOWN STRL REUS W/TWL XL LVL3 (GOWN DISPOSABLE) ×1 IMPLANT
KIT TURNOVER CYSTO (KITS) ×1 IMPLANT
MANIFOLD NEPTUNE II (INSTRUMENTS) ×1 IMPLANT
NDL ASPIRATION 22 (NEEDLE) ×1 IMPLANT
NDL HYPO 18GX1.5 BLUNT FILL (NEEDLE) ×1 IMPLANT
PACK CYSTO (CUSTOM PROCEDURE TRAY) ×1 IMPLANT
PAD ARMBOARD POSITIONER FOAM (MISCELLANEOUS) ×1 IMPLANT
POSITIONER HEAD 8X9X4 ADT (SOFTGOODS) ×1 IMPLANT
SYR 30ML LL (SYRINGE) ×1 IMPLANT
SYR CONTROL 10ML LL (SYRINGE) ×1 IMPLANT
TOWEL OR 17X26 4PK STRL BLUE (TOWEL DISPOSABLE) ×1 IMPLANT
WATER STERILE IRR 3000ML UROMA (IV SOLUTION) ×1 IMPLANT
WATER STERILE IRR 500ML POUR (IV SOLUTION) ×1 IMPLANT

## 2024-01-10 NOTE — Op Note (Signed)
 Preoperative diagnosis: neurogenic bladder  Postoperative diagnosis: same  Procedure: 1 cystoscopy 2. Intravesical botox injection 100 units  Attending: Belvie Clara  Anesthesia: General  Estimated blood loss: Minimal  Drains: SP tube  Specimens: none  Antibiotics: ancef   Findings:  Ureteral orifices in normal anatomic location.  No masses/lesions in the bladder  Indications: Patient is a 78 year old male with a history of neurogenic bladder and bladder spasms to anticholinergic therapy.  After discussing treatment options, they decided proceed with intravesical botox injection.  Procedure her in detail: The patient was brought to the operating room and a brief timeout was done to ensure correct patient, correct procedure, correct site.  General anesthesia was administered patient was placed in dorsal lithotomy position.  Their genitalia was then prepped and draped in usual sterile fashion.  A rigid 22 French cystoscope was passed in the urethra and the bladder.  Bladder was inspected and we noted no masses or lesions.  the ureteral orifices were in the normal orthotopic locations. Using a deflux injection needle we proceed to inject 100 units in a grid pattern between the ureteral orifices starting at the trigone to the mid posterior wall. We injected a total of 20 sites with 5 units per injection. The bladder was then drained and this concluded the procedure which was well tolerated by patient.  Complications: None  Condition: Stable, extubated, transferred to PACU  Plan: Patient is to be discharge home. They will followup in 1 month

## 2024-01-10 NOTE — H&P (Signed)
 HPI: [name] is a 78yo here for intravesical botox for bladder spasms. Toviaz  improved his bladder spasms but he had dry mouth and constipation. His urinary retention is currently managed with an SP tube     PMH:     Past Medical History:  Diagnosis Date   Back pain     COPD (chronic obstructive pulmonary disease) (HCC)     Diabetes mellitus without complication (HCC)     Dyspnea     Dysrhythmia     Hyperlipidemia     Oxygen dependent      4L when needed          Surgical History:      Past Surgical History:  Procedure Laterality Date   A-FLUTTER ABLATION N/A 08/23/2022    Procedure: A-FLUTTER ABLATION;  Surgeon: [name], MD;  Location: [facility];  Service: Cardiovascular;  Laterality: N/A;   BIOPSY   04/26/2022    Procedure: BIOPSY;  Surgeon: Shaaron Lamar HERO, MD;  Location: [facility];  Service: Endoscopy;;   BLADDER INSTILLATION N/A 11/16/2022    Procedure: BLADDER INSTILLATION- Gemcitibine;  Surgeon: Sherrilee Belvie CROME, MD;  Location: [facility];  Service: Urology;  Laterality: N/A;   COLONOSCOPY WITH PROPOFOL  N/A 04/26/2022    Procedure: COLONOSCOPY WITH PROPOFOL ;  Surgeon: Shaaron Lamar HERO, MD;  Location: [facility];  Service: Endoscopy;  Laterality: N/A;  10:00am; ASA 3   CYSTOSCOPY N/A 11/16/2022    Procedure: CYSTOSCOPY;  Surgeon: Sherrilee Belvie CROME, MD;  Location: [facility];  Service: Urology;  Laterality: N/A;   CYSTOSCOPY WITH INSERTION OF UROLIFT N/A 05/28/2023    Procedure: CYSTOSCOPY WITH INSERTION OF UROLIFT;  Surgeon: Sherrilee Belvie CROME, MD;  Location: [facility];  Service: Urology;  Laterality: N/A;   FLEXIBLE SIGMOIDOSCOPY N/A 07/16/2023    Procedure: KINGSTON SIDE;  Surgeon: Shaaron Lamar HERO, MD;  Location: [facility];  Service: Endoscopy;  Laterality: N/A;  230pm, ok rm 1/2   hemorhoidectomy       IR IMAGING GUIDED PORT INSERTION   05/12/2022   POLYPECTOMY   04/26/2022    Procedure: POLYPECTOMY;  Surgeon: Shaaron Lamar HERO, MD;  Location:  [facility];  Service: Endoscopy;;   TRANSURETHRAL RESECTION OF BLADDER TUMOR N/A 11/16/2022    Procedure: TRANSURETHRAL RESECTION OF BLADDER TUMOR (TURBT);  Surgeon: Sherrilee Belvie CROME, MD;  Location: [facility];  Service: Urology;  Laterality: N/A;          Home Medications:  Allergies as of 10/29/2023   No Known Allergies         Medication List           Accurate as of October 29, 2023 11:59 PM. If you have any questions, ask your nurse or doctor.              albuterol  108 (90 Base) MCG/ACT inhaler Commonly known as: VENTOLIN  HFA Inhale 2 puffs into the lungs every 6 (six) hours as needed for wheezing or shortness of breath.    calcium  carbonate 500 MG chewable tablet Commonly known as: TUMS - dosed in mg elemental calcium  Chew 1 tablet by mouth daily as needed for indigestion or heartburn.    febuxostat  40 MG tablet Commonly known as: ULORIC  Take 1 tablet (40 mg total) by mouth daily. What changed:  when to take this reasons to take this    fesoterodine  8 MG Tb24 tablet Commonly known as: TOVIAZ  Take 1 tablet (8 mg total) by mouth daily.  finasteride  5 MG tablet Commonly known as: PROSCAR  Take 1 tablet (5 mg total) by mouth daily.    furosemide  20 MG tablet Commonly known as: LASIX  Take 1 tablet (20 mg total) by mouth daily as needed.    GaviLyte-G 236 g solution Generic drug: polyethylene glycol Take 4,000 mLs by mouth once.    HYDROcodone -acetaminophen  5-325 MG tablet Commonly known as: NORCO/VICODIN Take 1 tablet by mouth every 6 (six) hours as needed for moderate pain (pain score 4-6).    levofloxacin  500 MG tablet Commonly known as: LEVAQUIN  Take 1 tablet (500 mg total) by mouth daily.    lidocaine -prilocaine  cream Commonly known as: EMLA  Apply 1 Application topically.    loperamide  2 MG capsule Commonly known as: IMODIUM  Take 2 mg by mouth 3 (three) times daily as needed for diarrhea or loose stools.    MELATONIN PO Take 1 tablet by  mouth at bedtime as needed (sleep).    mirabegron  ER 25 MG Tb24 tablet Commonly known as: MYRBETRIQ  Take 1 tablet (25 mg total) by mouth daily.    Misc. Devices Misc Please provide with shower chair    MUCINEX FAST-MAX CONG HEADACHE PO Take 1-2 tablets by mouth daily as needed (congestion).    naproxen sodium 220 MG tablet Commonly known as: ALEVE Take 220 mg by mouth daily as needed (pain).    prochlorperazine  10 MG tablet Commonly known as: COMPAZINE  Take 10 mg by mouth every 6 (six) hours as needed for nausea or vomiting.    silodosin  8 MG Caps capsule Commonly known as: RAPAFLO  Take 1 capsule (8 mg total) by mouth daily with breakfast.             Allergies:  Allergies  No Known Allergies     Family History: No family history on file.       Social History:  reports that he quit smoking about 7 years ago. His smoking use included cigarettes. He started smoking about 57 years ago. He has never used smokeless tobacco. He reports that he does not drink alcohol and does not use drugs.   ROS: All other review of systems were reviewed and are negative except what is noted above in HPI   Physical Exam: There were no vitals taken for this visit.  Constitutional:  Alert and oriented, No acute distress. HEENT: [place] AT, moist mucus membranes.  Trachea midline, no masses. Cardiovascular: No clubbing, cyanosis, or edema. Respiratory: Normal respiratory effort, no increased work of breathing. GI: Abdomen is soft, nontender, nondistended, no abdominal masses GU: No CVA tenderness.  Lymph: No cervical or inguinal lymphadenopathy. Skin: No rashes, bruises or suspicious lesions. Neurologic: Grossly intact, no focal deficits, moving all 4 extremities. Psychiatric: Normal mood and affect.   Laboratory Data: Recent Labs       Lab Results  Component Value Date    WBC 13.2 (H) 07/31/2023    HGB 10.6 (L) 07/31/2023    HCT 35.1 (L) 07/31/2023    MCV 92.9 07/31/2023    PLT 272  07/31/2023        Recent Labs       Lab Results  Component Value Date    CREATININE 0.96 07/31/2023        Recent Labs       Lab Results  Component Value Date    PSA 3.1 10/03/2018    PSA 1.7 10/03/2017        Recent Labs  No results found for: TESTOSTERONE     Recent  Labs       Lab Results  Component Value Date    HGBA1C 7.8 (H) 07/08/2022        Urinalysis Labs (Brief)          Component Value Date/Time    COLORURINE YELLOW 02/19/2023 1701    APPEARANCEUR Clear 10/29/2023 1524    LABSPEC 1.009 02/19/2023 1701    PHURINE 7.0 02/19/2023 1701    GLUCOSEU Negative 10/29/2023 1524    HGBUR LARGE (A) 02/19/2023 1701    BILIRUBINUR Negative 10/29/2023 1524    KETONESUR NEGATIVE 02/19/2023 1701    PROTEINUR 3+ (A) 10/29/2023 1524    PROTEINUR 100 (A) 02/19/2023 1701    NITRITE Positive (A) 10/29/2023 1524    NITRITE NEGATIVE 02/19/2023 1701    LEUKOCYTESUR 2+ (A) 10/29/2023 1524    LEUKOCYTESUR LARGE (A) 02/19/2023 1701        Recent Labs       Lab Results  Component Value Date    LABMICR See below: 10/29/2023    WBCUA >30 (A) 10/29/2023    LABEPIT 0-10 10/29/2023    MUCUS Present (A) 10/29/2023    BACTERIA Many (A) 10/29/2023        Pertinent Imaging:   No results found for this or any previous visit.   Results for orders placed during the hospital encounter of 10/07/18   US  Venous Img Lower Bilateral   Narrative CLINICAL DATA:  Bilateral lower extremity swelling.   EXAM: BILATERAL LOWER EXTREMITY VENOUS DOPPLER ULTRASOUND   TECHNIQUE: Gray-scale sonography with graded compression, as well as color Doppler and duplex ultrasound were performed to evaluate the lower extremity deep venous systems from the level of the common femoral vein and including the common femoral, femoral, profunda femoral, popliteal and calf veins including the posterior tibial, peroneal and gastrocnemius veins when visible. The superficial great saphenous vein  was also interrogated. Spectral Doppler was utilized to evaluate flow at rest and with distal augmentation maneuvers in the common femoral, femoral and popliteal veins.   COMPARISON:  None.   FINDINGS: RIGHT LOWER EXTREMITY   Common Femoral Vein: No evidence of thrombus. Normal compressibility, respiratory phasicity and response to augmentation.   Saphenofemoral Junction: No evidence of thrombus. Normal compressibility and flow on color Doppler imaging.   Profunda Femoral Vein: No evidence of thrombus. Normal compressibility and flow on color Doppler imaging.   Femoral Vein: No evidence of thrombus. Normal compressibility, respiratory phasicity and response to augmentation.   Popliteal Vein: No evidence of thrombus. Normal compressibility, respiratory phasicity and response to augmentation.   Calf Veins: No evidence of thrombus. Normal compressibility and flow on color Doppler imaging.   Superficial Great Saphenous Vein: No evidence of thrombus. Normal compressibility.   Other Findings:  None.   LEFT LOWER EXTREMITY   Common Femoral Vein: No evidence of thrombus. Normal compressibility, respiratory phasicity and response to augmentation.   Saphenofemoral Junction: No evidence of thrombus. Normal compressibility and flow on color Doppler imaging.   Profunda Femoral Vein: No evidence of thrombus. Normal compressibility and flow on color Doppler imaging.   Femoral Vein: No evidence of thrombus. Normal compressibility, respiratory phasicity and response to augmentation.   Popliteal Vein: No evidence of thrombus. Normal compressibility, respiratory phasicity and response to augmentation.   Calf Veins: No evidence of thrombus. Normal compressibility and flow on color Doppler imaging.   Other Findings: None.   IMPRESSION: No evidence of deep venous thrombosis in either lower extremity.     Electronically Signed  ByBETHA Ned  Register On: 10/07/2018 14:00   No  results found for this or any previous visit.   No results found for this or any previous visit.   Results for orders placed during the hospital encounter of 02/19/23   US  RENAL   Narrative CLINICAL DATA:  Acute kidney injury.   EXAM: RENAL / URINARY TRACT ULTRASOUND COMPLETE   COMPARISON:  CT scan 05/09/2022.   FINDINGS: Right Kidney:   Renal measurements: 12.4 x 6.0 x 5.9 cm = volume: 229 mL. Moderate right renal collecting system dilatation. No perinephric fluid. Preserved parenchyma.   Left Kidney:   Renal measurements: 12.6 x 6.2 x 5.2 cm = volume: 214.5 mL. Moderate collecting system dilatation. No perinephric fluid. Preserved parenchyma.   Bladder:   Distended bladder with significant wall thickening measuring up to 11 mm. Prevoid volume of 294 cc.   Other:   Evaluation somewhat limited by patient has been difficulty with procedure as per the sonographer.   IMPRESSION: Distended urinary bladder with a significant wall thickening. There is also moderate bilateral renal collecting system dilatation. Would recommend repeat study after decompression of the bladder to see if this persists or additional cross-sectional imaging workup with a CT when clinically appropriate     Electronically Signed By: Ranell Bring M.D. On: 02/20/2023 11:04   No results found for this or any previous visit.   Results for orders placed during the hospital encounter of 05/09/22   CT HEMATURIA WORKUP   Narrative CLINICAL DATA:  Hematuria. Bladder mass on recent MRI. Rectal carcinoma. * Tracking Code: BO *   EXAM: CT ABDOMEN AND PELVIS WITHOUT AND WITH CONTRAST   TECHNIQUE: Multidetector CT imaging of the abdomen and pelvis was performed following the standard protocol before and following the bolus administration of intravenous contrast.   RADIATION DOSE REDUCTION: This exam was performed according to the departmental dose-optimization program which includes  automated exposure control, adjustment of the mA and/or kV according to patient size and/or use of iterative reconstruction technique.   CONTRAST:  OMNIPAQUE  IOHEXOL  300 MG/ML  SOLN   COMPARISON:  05/01/2022   FINDINGS: Lower Chest: No acute findings.   Hepatobiliary: No hepatic masses identified. Gallbladder is unremarkable. No evidence of biliary ductal dilatation.   Pancreas:  No mass or inflammatory changes.   Spleen: Within normal limits in size and appearance.   Adrenals/Urinary Tract: No adrenal masses identified. Few tiny 1-2 mm calculi are noted in both kidneys. No evidence of ureteral calculi or hydronephrosis. No suspicious renal masses identified. No masses seen involving the collecting systems or ureters. Mild diffuse bladder wall thickening and trabeculation is seen which may be due to chronic bladder outlet obstruction or cystitis. A focal enhancing soft tissue nodule measuring approximately 1.2 cm is seen along the left lateral bladder wall, suspicious for bladder carcinoma.   Stomach/Bowel: Concentric rectal wall thickening and enhancement is seen, consistent with known primary rectal carcinoma. No evidence of obstruction, inflammatory process or abnormal fluid collections. Normal appendix visualized. Diverticulosis is seen mainly involving the descending and sigmoid colon, however there is no evidence of diverticulitis.   Vascular/Lymphatic: No pathologically enlarged lymph nodes. No acute vascular findings. Aortic atherosclerotic calcification incidentally noted. 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   Reproductive:  No mass or other significant abnormality.   Other:  None.   Musculoskeletal:  No suspicious bone lesions identified.   IMPRESSION: 1.2 cm focal enhancing soft tissue nodule  along the left lateral bladder wall, suspicious for bladder carcinoma.   Low rectal soft tissue mass,  consistent with known primary rectal carcinoma.   No evidence of metastatic disease within the abdomen or pelvis.   Mild diffuse bladder wall thickening and trabeculation, which may be due to chronic bladder outlet obstruction or cystitis.   Tiny nonobstructing bilateral renal calculi.   Colonic diverticulosis, without radiographic evidence of diverticulitis.   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   Aortic Atherosclerosis (ICD10-I70.0).     Electronically Signed By: Norleen DELENA Kil M.D. On: 05/09/2022 10:58   No results found for this or any previous visit.     Assessment & Plan:     1. Overactive bladder The risks/benefits/alternatives to intravesical botox therapy was explained to the patient and he understands and wishes to proceed with surgery

## 2024-01-10 NOTE — Anesthesia Procedure Notes (Signed)
 Date/Time: 01/10/2024 10:10 AM  Performed by: Barbarann Verneita RAMAN, CRNAPre-anesthesia Checklist: Patient identified, Emergency Drugs available, Suction available, Timeout performed and Patient being monitored Patient Re-evaluated:Patient Re-evaluated prior to induction Oxygen Delivery Method: Nasal Cannula

## 2024-01-10 NOTE — Transfer of Care (Signed)
 Immediate Anesthesia Transfer of Care Note  Patient: James Miles  Procedure(s) Performed: CYSTOSCOPY, WITH INJECTION OF BLADDER NECK OR BLADDER WALL (Bladder)  Patient Location: PACU  Anesthesia Type:MAC  Level of Consciousness: awake and patient cooperative  Airway & Oxygen Therapy: Patient Spontanous Breathing and Patient connected to nasal cannula oxygen  Post-op Assessment: Report given to RN and Post -op Vital signs reviewed and stable  Post vital signs: Reviewed and stable  Last Vitals:  Vitals Value Taken Time  BP 99/63 01/10/2024  1048  Temp 97.8 01/10/2024  1048  Pulse 71 01/10/24 10:48  Resp 13 01/10/24 10:48  SpO2 98 % 01/10/24 10:48  Vitals shown include unfiled device data.  Last Pain:  Vitals:   01/10/24 0802  PainSc: 0-No pain         Complications: No notable events documented.

## 2024-01-10 NOTE — Anesthesia Preprocedure Evaluation (Addendum)
 Anesthesia Evaluation  Patient identified by MRN, date of birth, ID band Patient awake    Reviewed: Allergy & Precautions, H&P , NPO status , Patient's Chart, lab work & pertinent test results  Airway Mallampati: II  TM Distance: >3 FB Neck ROM: Full    Dental  (+) Edentulous Lower, Edentulous Upper   Pulmonary shortness of breath, COPD,  oxygen dependent, former smoker    + decreased breath sounds      Cardiovascular + dysrhythmias Atrial Fibrillation  Rhythm:Irregular Rate:Normal  Frequent PVC's EKG 3/25 shows this as well   Neuro/Psych negative neurological ROS  negative psych ROS   GI/Hepatic negative GI ROS, Neg liver ROS,,,  Endo/Other  diabetes    Renal/GU negative Renal ROS  negative genitourinary   Musculoskeletal negative musculoskeletal ROS (+)    Abdominal   Peds negative pediatric ROS (+)  Hematology negative hematology ROS (+)   Anesthesia Other Findings   Reproductive/Obstetrics negative OB ROS                              Anesthesia Physical Anesthesia Plan  ASA: 4  Anesthesia Plan: MAC   Post-op Pain Management:    Induction: Intravenous  PONV Risk Score and Plan:   Airway Management Planned: LMA  Additional Equipment:   Intra-op Plan:   Post-operative Plan: Extubation in OR  Informed Consent: I have reviewed the patients History and Physical, chart, labs and discussed the procedure including the risks, benefits and alternatives for the proposed anesthesia with the patient or authorized representative who has indicated his/her understanding and acceptance.     Dental advisory given  Plan Discussed with: CRNA and Surgeon  Anesthesia Plan Comments: (WBC high; will discuss with surgeon Afebrile Denies CP/SOB)         Anesthesia Quick Evaluation

## 2024-01-10 NOTE — Anesthesia Postprocedure Evaluation (Signed)
 Anesthesia Post Note  Patient: James Miles  Procedure(s) Performed: CYSTOSCOPY, WITH INJECTION OF BLADDER NECK OR BLADDER WALL (Bladder)  Patient location during evaluation: PACU Anesthesia Type: MAC Level of consciousness: awake and alert Pain management: pain level controlled Vital Signs Assessment: post-procedure vital signs reviewed and stable Respiratory status: spontaneous breathing, nonlabored ventilation, respiratory function stable and patient connected to nasal cannula oxygen Cardiovascular status: stable and blood pressure returned to baseline Postop Assessment: no apparent nausea or vomiting Anesthetic complications: no   No notable events documented.   Last Vitals:  Vitals:   01/10/24 1045 01/10/24 1100  BP: 99/63 106/71  Pulse: 71 68  Resp: 12 14  Temp: 36.6 C   SpO2: 99% 100%    Last Pain:  Vitals:   01/10/24 1045  PainSc: 0-No pain                 Andrea Limes

## 2024-01-11 ENCOUNTER — Encounter (HOSPITAL_COMMUNITY): Payer: Self-pay | Admitting: Urology

## 2024-01-15 ENCOUNTER — Ambulatory Visit

## 2024-01-25 ENCOUNTER — Telehealth: Payer: Self-pay

## 2024-01-25 ENCOUNTER — Ambulatory Visit: Admitting: Urology

## 2024-01-25 VITALS — BP 139/75 | HR 82

## 2024-01-25 DIAGNOSIS — N3281 Overactive bladder: Secondary | ICD-10-CM

## 2024-01-25 DIAGNOSIS — R339 Retention of urine, unspecified: Secondary | ICD-10-CM

## 2024-01-25 MED ORDER — CIPROFLOXACIN HCL 500 MG PO TABS: 500.0000 mg | ORAL_TABLET | Freq: Once | ORAL | Status: DC

## 2024-01-25 NOTE — Progress Notes (Signed)
 Suprapubic Cath Change  Patient is present today for a suprapubic catheter change due to urinary retention.  10ml of water  was drained from the balloon, a 16FR foley cath was removed from the tract with out difficulty.  Suprapubic catheter site was cleaned and prepped in a sterile fashion with [id number]  A 16FR foley cath was replaced into the tract no complications were noted. Urine return was noted, urine Dark yellow in color . 10 ml of sterile water  was inflated into the balloon and a bed bag was attached for drainage.  Patient tolerated well. A night bag was given to patient and proper instruction was given on how to switch bags.    Performed by: Exie DASEN. CMA  Follow up: 4 weeks w/ cath change

## 2024-01-25 NOTE — Telephone Encounter (Signed)
 Called to make pt aware that cipro  was not given at appt pt needs to either call back to receive Rx or come by office to pick up

## 2024-01-25 NOTE — Progress Notes (Signed)
 "  01/25/2024 10:01 AM   James Miles 1946/01/08 [id number]  Referring provider: [name], NP [address] [place],  [place] [address]  Followup OAB   HPI: James Miles is a 78yo here for followup for OAB. He has been doing well since intravesical botox  injection. Bladder spasms are minimal. He denies any pelvic pain. NO hematuria    PMH: Past Medical History:  Diagnosis Date   Back pain    COPD (chronic obstructive pulmonary disease) (HCC)    Diabetes mellitus without complication (HCC)    Dyspnea    Dysrhythmia    Hyperlipidemia    Oxygen dependent    @l  as needed   Suprapubic catheter [facility])     Surgical History: Past Surgical History:  Procedure Laterality Date   A-FLUTTER ABLATION N/A 08/23/2022   Procedure: A-FLUTTER ABLATION;  Surgeon: [name], MD;  Location: [facility];  Service: Cardiovascular;  Laterality: N/A;   BIOPSY  04/26/2022   Procedure: BIOPSY;  Surgeon: Shaaron Lamar HERO, MD;  Location: [facility] SU[facility];  Service: Endoscopy;;   BLADDER INSTILLATION N/A 11/16/2022   Procedure: BLADDER INSTILLATION- Gemcitibine;  Surgeon: Sherrilee Belvie CROME, MD;  Location: [facility] ORS;  Service: Urology;  Laterality: N/A;   COLONOSCOPY WITH PROPOFOL  N/A 04/26/2022   Procedure: COLONOSCOPY WITH PROPOFOL ;  Surgeon: Shaaron Lamar HERO, MD;  Location: [facility];  Service: Endoscopy;  Laterality: N/A;  10:00am; ASA 3   CYSTOSCOPY N/A 11/16/2022   Procedure: CYSTOSCOPY;  Surgeon: Sherrilee Belvie CROME, MD;  Location: [facility] OR[facility];  Service: Urology;  Laterality: N/A;   CYSTOSCOPY WITH INJECTION N/A 01/10/2024   Procedure: CYSTOSCOPY, WITH INJECTION OF BLADDER NECK OR BLADDER WALL;  Surgeon: Sherrilee Belvie CROME, MD;  Location: [facility];  Service: Urology;  Laterality: N/A;   CYSTOSCOPY WITH INSERTION OF UROLIFT N/A 05/28/2023   Procedure: CYSTOSCOPY WITH INSERTION OF UROLIFT;  Surgeon: Sherrilee Belvie CROME, MD;  Location: [facility] OR[facility];  Service: Urology;   Laterality: N/A;   FLEXIBLE SIGMOIDOSCOPY N/A 07/16/2023   Procedure: KINGSTON SIDE;  Surgeon: Shaaron Lamar HERO, MD;  Location: [facility];  Service: Endoscopy;  Laterality: N/A;  230pm, ok rm 1/2   hemorhoidectomy     IR CYSTOSTOMY TUBE PLACEMENT/BLADDER ASPIRATION  11/13/2023   IR IMAGING GUIDED PORT INSERTION  05/12/2022   POLYPECTOMY  04/26/2022   Procedure: POLYPECTOMY;  Surgeon: Shaaron Lamar HERO, MD;  Location[facility];  Service: Endoscopy;;   TRANSURETHRAL RESECTION OF BLADDER TUMOR N/A 11/16/2022   Procedure: TRANSURETHRAL RESECTION OF BLADDER TUMOR (TURBT);  Surgeon: Sherrilee Belvie CROME, MD;  Location: [facility];  Service: Urology;  Laterality: N/A;    Home Medications:  Allergies as of 01/25/2024   No Known Allergies      Medication List        Accurate as of January 25, 2024 10:01 AM. If you have any questions, ask your nurse or doctor.          albuterol  108 (90 Base) MCG/ACT inhaler Commonly known as: VENTOLIN  HFA Inhale 2 puffs into the lungs every 6 (six) hours as needed for wheezing or shortness of breath.   apixaban  5 MG Tabs tablet Commonly known as: ELIQUIS  Take 1 tablet (5 mg total) by mouth 2 (two) times daily.   calcium  carbonate 500 MG chewable tablet Commonly known as: TUMS - dosed in mg elemental calcium  Chew 1 tablet by mouth daily as needed for indigestion or heartburn.   diltiazem  240  MG 24 hr capsule Commonly known as: CARDIZEM  CD Take 1 capsule (240 mg total) by mouth daily.   lidocaine -prilocaine  cream Commonly known as: EMLA  Apply 1 Application topically.   loperamide  2 MG capsule Commonly known as: IMODIUM  Take 2 mg by mouth 3 (three) times daily as needed for diarrhea or loose stools.   MELATONIN PO Take 1 tablet by mouth at bedtime as needed (sleep).   Misc. Devices Misc Please provide with shower chair   MUCINEX FAST-MAX CONG HEADACHE PO Take 1-2 tablets by mouth daily as needed (congestion).   naproxen sodium  220 MG tablet Commonly known as: ALEVE Take 220 mg by mouth daily as needed (pain).   tamsulosin  0.4 MG Caps capsule Commonly known as: FLOMAX  Take 0.8 mg by mouth daily.        Allergies: Allergies[1]  Family History: No family history on file.  Social History:  reports that he quit smoking about 7 years ago. His smoking use included cigarettes. He started smoking about 57 years ago. He has never used smokeless tobacco. He reports that he does not drink alcohol and does not use drugs.  ROS: All other review of systems were reviewed and are negative except what is noted above in HPI  Physical Exam: BP 139/75   Pulse 82   Constitutional:  Alert and oriented, No acute distress. HEENT: [place] AT, moist mucus membranes.  Trachea midline, no masses. Cardiovascular: No clubbing, cyanosis, or edema. Respiratory: Normal respiratory effort, no increased work of breathing. GI: Abdomen is soft, nontender, nondistended, no abdominal masses GU: No CVA tenderness.  Lymph: No cervical or inguinal lymphadenopathy. Skin: No rashes, bruises or suspicious lesions. Neurologic: Grossly intact, no focal deficits, moving all 4 extremities. Psychiatric: Normal mood and affect.  Laboratory Data: Lab Results  Component Value Date   WBC 15.6 (H) 11/28/2023   HGB 11.2 (L) 11/28/2023   HCT 35.1 (L) 11/28/2023   MCV 91.2 11/28/2023   PLT 271 11/28/2023    Lab Results  Component Value Date   CREATININE 1.15 11/28/2023    Lab Results  Component Value Date   PSA 3.1 10/03/2018   PSA 1.7 10/03/2017    No results found for: TESTOSTERONE  Lab Results  Component Value Date   HGBA1C 7.8 (H) 07/08/2022    Urinalysis    Component Value Date/Time   COLORURINE YELLOW 02/19/2023 1701   APPEARANCEUR Clear 10/29/2023 1524   LABSPEC 1.009 02/19/2023 1701   PHURINE 7.0 02/19/2023 1701   GLUCOSEU Negative 10/29/2023 1524   HGBUR LARGE (A) 02/19/2023 1701   BILIRUBINUR Negative 10/29/2023 1524    KETONESUR NEGATIVE 02/19/2023 1701   PROTEINUR 3+ (A) 10/29/2023 1524   PROTEINUR 100 (A) 02/19/2023 1701   NITRITE Positive (A) 10/29/2023 1524   NITRITE NEGATIVE 02/19/2023 1701   LEUKOCYTESUR 2+ (A) 10/29/2023 1524   LEUKOCYTESUR LARGE (A) 02/19/2023 1701    Lab Results  Component Value Date   LABMICR See below: 10/29/2023   WBCUA >30 (A) 10/29/2023   LABEPIT 0-10 10/29/2023   MUCUS Present (A) 10/29/2023   BACTERIA Many (A) 10/29/2023    Pertinent Imaging:  No results found for this or any previous visit.  Results for orders placed during the hospital encounter of 10/07/18  US  Venous Img Lower Bilateral  Narrative CLINICAL DATA:  Bilateral lower extremity swelling.  EXAM: BILATERAL LOWER EXTREMITY VENOUS DOPPLER ULTRASOUND  TECHNIQUE: Gray-scale sonography with graded compression, as well as color Doppler and duplex ultrasound were performed to evaluate the  lower extremity deep venous systems from the level of the common femoral vein and including the common femoral, femoral, profunda femoral, popliteal and calf veins including the posterior tibial, peroneal and gastrocnemius veins when visible. The superficial great saphenous vein was also interrogated. Spectral Doppler was utilized to evaluate flow at rest and with distal augmentation maneuvers in the common femoral, femoral and popliteal veins.  COMPARISON:  None.  FINDINGS: RIGHT LOWER EXTREMITY  Common Femoral Vein: No evidence of thrombus. Normal compressibility, respiratory phasicity and response to augmentation.  Saphenofemoral Junction: No evidence of thrombus. Normal compressibility and flow on color Doppler imaging.  Profunda Femoral Vein: No evidence of thrombus. Normal compressibility and flow on color Doppler imaging.  Femoral Vein: No evidence of thrombus. Normal compressibility, respiratory phasicity and response to augmentation.  Popliteal Vein: No evidence of thrombus. Normal  compressibility, respiratory phasicity and response to augmentation.  Calf Veins: No evidence of thrombus. Normal compressibility and flow on color Doppler imaging.  Superficial Great Saphenous Vein: No evidence of thrombus. Normal compressibility.  Other Findings:  None.  LEFT LOWER EXTREMITY  Common Femoral Vein: No evidence of thrombus. Normal compressibility, respiratory phasicity and response to augmentation.  Saphenofemoral Junction: No evidence of thrombus. Normal compressibility and flow on color Doppler imaging.  Profunda Femoral Vein: No evidence of thrombus. Normal compressibility and flow on color Doppler imaging.  Femoral Vein: No evidence of thrombus. Normal compressibility, respiratory phasicity and response to augmentation.  Popliteal Vein: No evidence of thrombus. Normal compressibility, respiratory phasicity and response to augmentation.  Calf Veins: No evidence of thrombus. Normal compressibility and flow on color Doppler imaging.  Other Findings: None.  IMPRESSION: No evidence of deep venous thrombosis in either lower extremity.   Electronically Signed By: Debby  Register On: 10/07/2018 14:00  No results found for this or any previous visit.  No results found for this or any previous visit.  Results for orders placed during the hospital encounter of 02/19/23  US  RENAL  Narrative CLINICAL DATA:  Acute kidney injury.  EXAM: RENAL / URINARY TRACT ULTRASOUND COMPLETE  COMPARISON:  CT scan 05/09/2022.  FINDINGS: Right Kidney:  Renal measurements: 12.4 x 6.0 x 5.9 cm = volume: 229 mL. Moderate right renal collecting system dilatation. No perinephric fluid. Preserved parenchyma.  Left Kidney:  Renal measurements: 12.6 x 6.2 x 5.2 cm = volume: 214.5 mL. Moderate collecting system dilatation. No perinephric fluid. Preserved parenchyma.  Bladder:  Distended bladder with significant wall thickening measuring up to 11 mm. Prevoid  volume of 294 cc.  Other:  Evaluation somewhat limited by patient has been difficulty with procedure as per the sonographer.  IMPRESSION: Distended urinary bladder with a significant wall thickening. There is also moderate bilateral renal collecting system dilatation. Would recommend repeat study after decompression of the bladder to see if this persists or additional cross-sectional imaging workup with a CT when clinically appropriate   Electronically Signed By: Ranell Bring M.D. On: 02/20/2023 11:04  No results found for this or any previous visit.  Results for orders placed during the hospital encounter of 05/09/22  CT HEMATURIA WORKUP  Narrative CLINICAL DATA:  Hematuria. Bladder mass on recent MRI. Rectal carcinoma. * Tracking Code: BO *  EXAM: CT ABDOMEN AND PELVIS WITHOUT AND WITH CONTRAST  TECHNIQUE: Multidetector CT imaging of the abdomen and pelvis was performed following the standard protocol before and following the bolus administration of intravenous contrast.  RADIATION DOSE REDUCTION: This exam was performed according to the departmental dose-optimization program  which includes automated exposure control, adjustment of the mA and/or kV according to patient size and/or use of iterative reconstruction technique.  CONTRAST:  OMNIPAQUE  IOHEXOL  300 MG/ML  SOLN  COMPARISON:  05/01/2022  FINDINGS: Lower Chest: No acute findings.  Hepatobiliary: No hepatic masses identified. Gallbladder is unremarkable. No evidence of biliary ductal dilatation.  Pancreas:  No mass or inflammatory changes.  Spleen: Within normal limits in size and appearance.  Adrenals/Urinary Tract: No adrenal masses identified. Few tiny 1-2 mm calculi are noted in both kidneys. No evidence of ureteral calculi or hydronephrosis. No suspicious renal masses identified. No masses seen involving the collecting systems or ureters. Mild diffuse bladder wall thickening and  trabeculation is seen which may be due to chronic bladder outlet obstruction or cystitis. A focal enhancing soft tissue nodule measuring approximately 1.2 cm is seen along the left lateral bladder wall, suspicious for bladder carcinoma.  Stomach/Bowel: Concentric rectal wall thickening and enhancement is seen, consistent with known primary rectal carcinoma. No evidence of obstruction, inflammatory process or abnormal fluid collections. Normal appendix visualized. Diverticulosis is seen mainly involving the descending and sigmoid colon, however there is no evidence of diverticulitis.  Vascular/Lymphatic: No pathologically enlarged lymph nodes. No acute vascular findings. Aortic atherosclerotic calcification incidentally noted. 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  Reproductive:  No mass or other significant abnormality.  Other:  None.  Musculoskeletal:  No suspicious bone lesions identified.  IMPRESSION: 1.2 cm focal enhancing soft tissue nodule along the left lateral bladder wall, suspicious for bladder carcinoma.  Low rectal soft tissue mass, consistent with known primary rectal carcinoma.  No evidence of metastatic disease within the abdomen or pelvis.  Mild diffuse bladder wall thickening and trabeculation, which may be due to chronic bladder outlet obstruction or cystitis.  Tiny nonobstructing bilateral renal calculi.  Colonic diverticulosis, without radiographic evidence of diverticulitis.  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  Aortic Atherosclerosis (ICD10-I70.0).   Electronically Signed By: Norleen DELENA Kil M.D. On: 05/09/2022 10:58  No results found for this or any previous visit.   Assessment & Plan:    1. Overactive bladder Schedule for intravesical bortox 200 units in 6 months  - Ambulatory Referral For Surgery Scheduling   No follow-ups on file.  Belvie Clara, MD  [facility] [place]       [1] No Known Allergies  "

## 2024-01-26 ENCOUNTER — Other Ambulatory Visit: Payer: Self-pay

## 2024-02-11 ENCOUNTER — Encounter: Payer: Self-pay | Admitting: Oncology

## 2024-02-12 ENCOUNTER — Ambulatory Visit (HOSPITAL_COMMUNITY)
Admission: RE | Admit: 2024-02-12 | Discharge: 2024-02-12 | Disposition: A | Source: Ambulatory Visit | Attending: Oncology

## 2024-02-12 ENCOUNTER — Ambulatory Visit (HOSPITAL_COMMUNITY)
Admission: RE | Admit: 2024-02-12 | Discharge: 2024-02-12 | Disposition: A | Discharge status: Home / Self Care | Source: Ambulatory Visit | Attending: Oncology | Admitting: Oncology

## 2024-02-12 ENCOUNTER — Inpatient Hospital Stay: Attending: Oncology

## 2024-02-12 ENCOUNTER — Encounter: Payer: Self-pay | Admitting: Urology

## 2024-02-12 DIAGNOSIS — C20 Malignant neoplasm of rectum: Secondary | ICD-10-CM | POA: Insufficient documentation

## 2024-02-12 DIAGNOSIS — D509 Iron deficiency anemia, unspecified: Secondary | ICD-10-CM | POA: Insufficient documentation

## 2024-02-12 DIAGNOSIS — E538 Deficiency of other specified B group vitamins: Secondary | ICD-10-CM | POA: Diagnosis not present

## 2024-02-12 DIAGNOSIS — D508 Other iron deficiency anemias: Secondary | ICD-10-CM

## 2024-02-12 LAB — CBC WITH DIFFERENTIAL/PLATELET
Abs Immature Granulocytes: 0.02 K/uL (ref 0.00–0.07)
Basophils Absolute: 0 K/uL (ref 0.0–0.1)
Basophils Relative: 0 %
Eosinophils Absolute: 0.2 K/uL (ref 0.0–0.5)
Eosinophils Relative: 2 %
HCT: 40.2 % (ref 39.0–52.0)
Hemoglobin: 12.7 g/dL — ABNORMAL LOW (ref 13.0–17.0)
Immature Granulocytes: 0 %
Lymphocytes Relative: 12 %
Lymphs Abs: 1.2 K/uL (ref 0.7–4.0)
MCH: 29.2 pg (ref 26.0–34.0)
MCHC: 31.6 g/dL (ref 30.0–36.0)
MCV: 92.4 fL (ref 80.0–100.0)
Monocytes Absolute: 0.7 K/uL (ref 0.1–1.0)
Monocytes Relative: 7 %
Neutro Abs: 7.9 K/uL — ABNORMAL HIGH (ref 1.7–7.7)
Neutrophils Relative %: 79 %
Platelets: 226 K/uL (ref 150–400)
RBC: 4.35 MIL/uL (ref 4.22–5.81)
RDW: 12.7 % (ref 11.5–15.5)
WBC: 9.9 K/uL (ref 4.0–10.5)
nRBC: 0 % (ref 0.0–0.2)

## 2024-02-12 LAB — COMPREHENSIVE METABOLIC PANEL WITH GFR
ALT: 13 U/L (ref 0–44)
AST: 19 U/L (ref 15–41)
Albumin: 4.2 g/dL (ref 3.5–5.0)
Alkaline Phosphatase: 81 U/L (ref 38–126)
Anion gap: 4 — ABNORMAL LOW (ref 5–15)
BUN: 20 mg/dL (ref 8–23)
CO2: 35 mmol/L — ABNORMAL HIGH (ref 22–32)
Calcium: 9.1 mg/dL (ref 8.9–10.3)
Chloride: 100 mmol/L (ref 98–111)
Creatinine, Ser: 0.98 mg/dL (ref 0.61–1.24)
GFR, Estimated: >60 mL/min
Glucose, Bld: 113 mg/dL — ABNORMAL HIGH (ref 70–99)
Potassium: 4.2 mmol/L (ref 3.5–5.1)
Sodium: 139 mmol/L (ref 135–145)
Total Bilirubin: 0.4 mg/dL (ref 0.0–1.2)
Total Protein: 7.3 g/dL (ref 6.5–8.1)

## 2024-02-12 LAB — FERRITIN: Ferritin: 53 ng/mL (ref 24–336)

## 2024-02-12 LAB — IRON AND TIBC
Iron: 39 ug/dL — ABNORMAL LOW (ref 45–182)
Saturation Ratios: 13 % — ABNORMAL LOW (ref 17.9–39.5)
TIBC: 307 ug/dL (ref 250–450)
UIBC: 268 ug/dL

## 2024-02-12 LAB — FOLATE: Folate: 5.3 ng/mL — ABNORMAL LOW

## 2024-02-12 LAB — VITAMIN B12: Vitamin B-12: 440 pg/mL (ref 180–914)

## 2024-02-12 MED ORDER — IOHEXOL 9 MG/ML PO SOLN
ORAL | Status: AC
  Filled 2024-02-12: qty 1000

## 2024-02-12 MED ORDER — IOHEXOL 300 MG/ML  SOLN
100.0000 mL | Freq: Once | INTRAMUSCULAR | Status: AC | PRN
  Administered 2024-02-12: 100 mL via INTRAVENOUS

## 2024-02-12 MED ORDER — HEPARIN SOD (PORK) LOCK FLUSH 100 UNIT/ML IV SOLN
INTRAVENOUS | Status: AC
  Filled 2024-02-12: qty 5

## 2024-02-12 MED ORDER — HEPARIN SOD (PORK) LOCK FLUSH 100 UNIT/ML IV SOLN
500.0000 [IU] | Freq: Once | INTRAVENOUS | Status: AC
  Administered 2024-02-12: 500 [IU] via INTRAVENOUS

## 2024-02-12 NOTE — Progress Notes (Signed)
Port flushed with good blood return noted. No bruising or swelling at site. Patient left accessed for CT scan and patient discharged in satisfactory condition. VVS stable with no signs or symptoms of distressed noted. 

## 2024-02-12 NOTE — Patient Instructions (Signed)

## 2024-02-12 NOTE — Patient Instructions (Signed)
 [facility] [place] - A DEPT OF [facility]  Discharge Instructions: Thank you for choosing [facility] to provide your oncology and hematology care.  If you have a lab appointment with the Cancer Center - please note that after April 8th, 2024, all labs will be drawn in the cancer center.  You do not have to check in or register with the main entrance as you have in the past but will complete your check-in in the cancer center.  Wear comfortable clothing and clothing appropriate for easy access to any Portacath or PICC line.   We strive to give you quality time with your provider. You may need to reschedule your appointment if you arrive late (15 or more minutes).  Arriving late affects you and other patients whose appointments are after yours.  Also, if you miss three or more appointments without notifying the office, you may be dismissed from the clinic at the provider's discretion.      For prescription refill requests, have your pharmacy contact our office and allow 72 hours for refills to be completed.    Today you received the following: Port flush with labs   To help prevent nausea and vomiting after your treatment, we encourage you to take your nausea medication as directed.  BELOW ARE SYMPTOMS THAT SHOULD BE REPORTED IMMEDIATELY: *FEVER GREATER THAN 100.4 F (38 C) OR HIGHER *CHILLS OR SWEATING *NAUSEA AND VOMITING THAT IS NOT CONTROLLED WITH YOUR NAUSEA MEDICATION *UNUSUAL SHORTNESS OF BREATH *UNUSUAL BRUISING OR BLEEDING *URINARY PROBLEMS (pain or burning when urinating, or frequent urination) *BOWEL PROBLEMS (unusual diarrhea, constipation, pain near the anus) TENDERNESS IN MOUTH AND THROAT WITH OR WITHOUT PRESENCE OF ULCERS (sore throat, sores in mouth, or a toothache) UNUSUAL RASH, SWELLING OR PAIN  UNUSUAL VAGINAL DISCHARGE OR ITCHING   Items with * indicate a potential emergency and should be followed up as soon as possible or go to  the Emergency Department if any problems should occur.  Please show the CHEMOTHERAPY ALERT CARD or IMMUNOTHERAPY ALERT CARD at check-in to the Emergency Department and triage nurse.  Should you have questions after your visit or need to cancel or reschedule your appointment, please contact [facility] [place] - A DEPT OF [facility] [phone]  and follow the prompts.  Office hours are 8:00 a.m. to 4:30 p.m. Monday - Friday. Please note that voicemails left after 4:00 p.m. may not be returned until the following business day.  We are closed weekends and major holidays. You have access to a nurse at all times for urgent questions. Please call the main number to the clinic [phone] and follow the prompts.  For any non-urgent questions, you may also contact your provider using MyChart. We now offer e-Visits for anyone 2 and older to request care online for non-urgent symptoms. For details visit [email or web address].   Also download the MyChart app! Go to the app store, search "MyChart", open the app, select [place], and log in with your MyChart username and password.

## 2024-02-13 LAB — CEA: CEA: 1.6 ng/mL (ref 0.0–4.7)

## 2024-02-19 ENCOUNTER — Inpatient Hospital Stay: Admitting: Oncology

## 2024-02-19 VITALS — BP 112/69 | HR 70 | Temp 97.7°F | Resp 16 | Wt 169.2 lb

## 2024-02-19 DIAGNOSIS — C20 Malignant neoplasm of rectum: Secondary | ICD-10-CM | POA: Diagnosis not present

## 2024-02-19 DIAGNOSIS — E538 Deficiency of other specified B group vitamins: Secondary | ICD-10-CM | POA: Diagnosis not present

## 2024-02-19 DIAGNOSIS — D508 Other iron deficiency anemias: Secondary | ICD-10-CM | POA: Diagnosis not present

## 2024-02-19 DIAGNOSIS — D509 Iron deficiency anemia, unspecified: Secondary | ICD-10-CM | POA: Insufficient documentation

## 2024-02-19 MED ORDER — FOLIC ACID 1 MG PO TABS: 1.0000 mg | ORAL_TABLET | Freq: Every day | ORAL | 2 refills | Status: AC

## 2024-02-19 NOTE — Patient Instructions (Signed)
 [facility] at [facility] Discharge Instructions   You were seen and examined today by Dr. Davonna.  She reviewed the results of your lab work which are normal/stable.   She reviewed the results of your scans which did not show any evidence of cancer.   We will see you back in 3 months. We will repeat lab work and a CT scan prior to this visit.    Return as scheduled.    Thank you for choosing [facility] at [facility] to provide your oncology and hematology care.  To afford each patient quality time with our provider, please arrive at least 15 minutes before your scheduled appointment time.   If you have a lab appointment with the Cancer Center please come in thru the Main Entrance and check in at the main information desk.  You need to re-schedule your appointment should you arrive 10 or more minutes late.  We strive to give you quality time with our providers, and arriving late affects you and other patients whose appointments are after yours.  Also, if you no show three or more times for appointments you may be dismissed from the clinic at the providers discretion.     Again, thank you for choosing [facility].  Our hope is that these requests will decrease the amount of time that you wait before being seen by our physicians.       _____________________________________________________________  Should you have questions after your visit to [facility], please contact our office at [phone] and follow the prompts.  Our office hours are 8:00 a.m. and 4:30 p.m. Monday - Friday.  Please note that voicemails left after 4:00 p.m. may not be returned until the following business day.  We are closed weekends and major holidays.  You do have access to a nurse 24-7, just call the main number to the clinic [phone] and do not press any options, hold on the line and a nurse will answer the phone.    For prescription  refill requests, have your pharmacy contact our office and allow 72 hours.    Due to Covid, you will need to wear a mask upon entering the hospital. If you do not have a mask, a mask will be given to you at the Main Entrance upon arrival. For doctor visits, patients may have 1 support person age 35 or older with them. For treatment visits, patients can not have anyone with them due to social distancing guidelines and our immunocompromised population.

## 2024-02-19 NOTE — Progress Notes (Signed)
 " Patient Care Team: [name], [name], NP as PCP - General [name], MD as PCP - Cardiology (Cardiology) [name], MD as PCP - Electrophysiology (Cardiology) [name], RN as Oncology Nurse Navigator (Medical Oncology)  Clinic Day:  02/19/2024  Referring physician: [name], NP   CHIEF COMPLAINT:  CC: Stage II (T3 N0 M0) low rectal adenocarcinoma, MMR preserved    ASSESSMENT & PLAN:   Assessment & Plan: James Miles  is a 79 y.o. male with rectal adenocarcinoma  Assessment and Plan  Malignant neoplasm of rectum Stage II (T3N0M0). Extensive oncology history below S/p TNT but patient did not undergo surgery due to complications with the ureteral and bladder masses that are not malignant  -We reviewed the recent MRI findings together.  Stable exam.  No recurrent or metastatic disease within the pelvis.  Will repeat in 6 months. -Last CT CAP from 02/2024 showed no evidence of disease.  Will repeat CT scan in three months. -Labs reviewed: CEA: 1.6.  Will repeat every 3 months. - Last sigmoidoscopy was in 07/2023.  Needs a repeat in December.  Reached out to Dr. Ivonne office.  Patient did not schedule an appointment but will reach out at this time. - NCCN surveillance guidelines for patients with did not undergo surgery:  - History and physical every 3 to 6 months for 2 years, then every 6 months for a total of 5 years -CEA every 3-6 months for 2 years, then every 6 months for a total of 5 years -DRE and proctoscopy or flexible sigmoidoscopy every 3-4 months for 2 years, then every  months for a total of 5 years -MRI rectum every 6 months for up to 3 years -CT chest/abdomen every 6-12 months for a total of 5 years; CT pelvis to be included once no longer doing MRI -Colonoscopy at 1 year following completion of therapy  Return to clinic in 3 months with above imaging.  Iron deficiency anemia due to chronic blood loss Severe iron  deficiency anemia likely secondary to chronic blood loss. IV iron therapy planned to address low iron levels. Discussed common side effects of IV iron, including allergic reactions, nausea, headache, and brown discoloration.  Premedicationwill be provided to mitigate these side effects. Over-the-counter iron supplements not recommended due to potential constipation.  - Administer IV iron infusion. - Start taking oral iron every other day.  MiraLAX  for constipation.  Hematuria with indwelling urinary catheter Hematuria with blood clots noted in urine, possibly related to indwelling urinary catheter. Recent urology visit confirmed presence of blood clots. Catheter requires monthly replacement.  - Continue monthly catheter replacement. - Continue to follow with urology  Folate deficiency Recommended starting to take folic acid  1 mg daily  The patient understands the plans discussed today and is in agreement with them.  He knows to contact our office if he develops concerns prior to his next appointment.  23 minutes of total time was spent for this patient encounter, including preparation,review of records,  face-to-face counseling with the patient and coordination of care, physical exam, and documentation of the encounter.    LILLETTE Verneta SAUNDERS Teague,acting as a neurosurgeon for [name], MD.,have documented all relevant documentation on the behalf of [name], MD,as directed by  [name], MD while in the presence of [name], MD.  I, [name] MD, have reviewed the above documentation for accuracy and completeness, and I agree with the above.    [name], MD  [facility]  - A DEPT OF [facility] [address] [place] [address] Dept: [phone] Dept Fax: [phone]   Orders Placed This Encounter  Procedures   CT CHEST ABDOMEN PELVIS W CONTRAST    Standing Status:   Future    Expected  Date:   05/19/2024    Expiration Date:   02/18/2025    If indicated for the ordered procedure, I authorize the administration of contrast media per Radiology protocol:   Yes    Does the patient have a contrast media/X-ray dye allergy?:   No    Preferred imaging location?:   [facility]    If indicated for the ordered procedure, I authorize the administration of oral contrast media per Radiology protocol:   Yes   Ferritin    Standing Status:   Future    Expected Date:   05/19/2024    Expiration Date:   08/17/2024   Folate    Standing Status:   Future    Expected Date:   05/19/2024    Expiration Date:   08/17/2024   Vitamin B12    Standing Status:   Future    Expected Date:   05/19/2024    Expiration Date:   08/17/2024   CBC with Differential/Platelet    Standing Status:   Future    Expected Date:   05/19/2024    Expiration Date:   08/17/2024   Comprehensive metabolic panel with GFR    Standing Status:   Future    Expected Date:   05/19/2024    Expiration Date:   08/17/2024   Iron and TIBC    Standing Status:   Future    Expected Date:   05/19/2024    Expiration Date:   08/17/2024   CEA    Standing Status:   Future    Expected Date:   05/19/2024    Expiration Date:   08/17/2024     ONCOLOGY HISTORY:   I have reviewed his chart and materials related to his cancer extensively and collaborated history with the patient. Summary of oncologic history is as follows:   Diagnosis: Stage II (T3 N0 M0) low rectal adenocarcinoma, MMR preserved   -Presentation: Intermittent rectal bleeding for 6 months -04/26/2022: Colonoscopy found an exophytic semilunar neoplastic appearing process beginning at the anal verge and extending proximally about 6 cm.  Pathology: Invasive moderately differentiated adenocarcinoma. MMR preserved.  -04/26/2022: CEA 2.6 -05/01/2022: CT CAP: Nodular wall thickening along the rectum consistent with the history of known mass. Adjacent vascular engorgement. No intrinsic  abnormal lymph node enlargement. No liver lesion. Fatty liver infiltration. Nodule along the left side of the urinary bladder. In addition there is small mass along the course of the distal left ureter. This is worrisome for overall multifocal TCC.  Small lung nodules measuring up to 4 mm.   -05/03/2022: MRI Staging Pelvis: 6.4 cm left lateral low rectal tumor abutting the internal anal sphincter. Distance from tumor to the internal anal sphincter is 0 cm. Staging: T3c N0.  -05/11/2022: Initial PET: Hypermetabolic rectal mass, consistent with primary rectal carcinoma. 5 mm left posterior perirectal lymph node shows no FDG uptake, but is too small to accurately characterize by PET. No other evidence of metastatic disease. -05/16/2022: Patient evaluated by Dr. Debby kansky surgeon] who recommended TNT and discussed APR with colostomy -06/07/2022-09/25/2022: 8 cycles of FOLFOX  -12/28/2022-02/14/2023: ChemoRT with Xeloda  1500 mg twice daily  -Patient  under surveillance with scans and sigmoidoscopes. He did not undergo surgery due to left urinary bladder and distal left ureteral masses that were negative for cancer and requiring foley catheter.  -02/2023-11/2023: NED per imaging. CEA stable to improved.  -07/16/2023: Sigmoidoscopy with rectal biopsy negative for carcinoma.  -11/28/2023: MRI Staging Pelvis: Asymmetric wall thickening of the right lateral rectal wall raises the concern for possible recurrent malignancy versus artifactual due to under distention. Findings are indeterminate. No new lymphadenopathy or evidence of extra colonic metastasis within the field of view. -02/12/2024: CT CAP: Stable mild rectal wall thickening but no discrete mass. No mesorectal or sigmoid mesocolon lymphadenopathy. No findings for metastatic disease involving the chest, abdomen or pelvis. Much improved appearance of the right lower lobe. Some persistent areas of irregular airspace consolidation and likely post mnemonic  scarring. There is also debris noted in the lower right trachea and right mainstem bronchus and some component of ongoing aspiration would be a consideration. - 02/12/2024: MRI pelvis: Stable exam.  No recurrent or metastatic disease within the pelvis.  Current Treatment:  Surveillance  INTERVAL HISTORY:   James Miles is here today for follow-up of rectal cancer. He is accompanied by his son today.   He reports feeling well overall. Admiral has not been scheduled for repeat sigmoidoscopy as he was unaware of the reasoning behind having this procedure done.   Hardie denies any significant tiredness and has not received IV iron, though he does take OTC supplements that likely have iron in them. He is anemic today and is agreeable to be scheduled for IV iron. He denies any hematuria.   We discussed CT scan results which showed stable disease, as well as labs.   I have reviewed the past medical history, past surgical history, social history and family history with the patient and they are unchanged from previous note.  ALLERGIES:  has no known allergies.  MEDICATIONS:  Current Outpatient Medications  Medication Sig Dispense Refill   albuterol  (VENTOLIN  HFA) 108 (90 Base) MCG/ACT inhaler Inhale 2 puffs into the lungs every 6 (six) hours as needed for wheezing or shortness of breath.     apixaban  (ELIQUIS ) 5 MG TABS tablet Take 1 tablet (5 mg total) by mouth 2 (two) times daily. 60 tablet 11   calcium  carbonate (TUMS - DOSED IN MG ELEMENTAL CALCIUM ) 500 MG chewable tablet Chew 1 tablet by mouth daily as needed for indigestion or heartburn.     diltiazem  (CARDIZEM  CD) 240 MG 24 hr capsule Take 1 capsule (240 mg total) by mouth daily. 30 capsule 11   DM-Phenylephrine -Acetaminophen  (MUCINEX FAST-MAX CONG HEADACHE PO) Take 1-2 tablets by mouth daily as needed (congestion).     folic acid  (FOLVITE ) 1 MG tablet Take 1 tablet (1 mg total) by mouth daily. 90 tablet 2   lidocaine -prilocaine  (EMLA )  cream Apply 1 Application topically.     loperamide  (IMODIUM ) 2 MG capsule Take 2 mg by mouth 3 (three) times daily as needed for diarrhea or loose stools.     MELATONIN PO Take 1 tablet by mouth at bedtime as needed (sleep).     Misc. Devices MISC Please provide with shower chair 1 Units 0   naproxen sodium (ALEVE) 220 MG tablet Take 220 mg by mouth daily as needed (pain).     tamsulosin  (FLOMAX ) 0.4 MG CAPS capsule Take 0.8 mg by mouth daily.     Current Facility-Administered Medications  Medication Dose Route Frequency Provider Last Rate Last Admin   ciprofloxacin  (  CIPRO ) tablet 500 mg  500 mg Oral Once Nieves Cough, MD        VITALS:  Blood pressure 112/69, pulse 70, temperature 97.7 F (36.5 C), temperature source Oral, resp. rate 16, weight 169 lb 3.2 oz (76.7 kg), SpO2 96%.  Wt Readings from Last 3 Encounters:  02/19/24 169 lb 3.2 oz (76.7 kg)  01/10/24 154 lb 15.7 oz (70.3 kg)  01/09/24 154 lb 15.7 oz (70.3 kg)    Body mass index is 23.6 kg/m.  Performance status (ECOG): 2 - Symptomatic, <50% confined to bed  PHYSICAL EXAM:   GENERAL: Frail male in a wheelchair  SKIN: skin color, texture, turgor are normal, no rashes or significant lesions LYMPH:  no palpable lymphadenopathy in the cervical, axillary or inguinal LUNGS: clear to auscultation and percussion with normal breathing effort HEART: regular rate & rhythm and no murmurs and no lower extremity edema ABDOMEN:abdomen soft, non-tender and normal bowel sounds, suprapubic catheter in place with normal urine Musculoskeletal:no cyanosis of digits and no clubbing  NEURO: alert & oriented x 3 with fluent speech  LABORATORY DATA:  I have reviewed the data as listed   Lab Results  Component Value Date   WBC 9.9 02/12/2024   NEUTROABS 7.9 (H) 02/12/2024   HGB 12.7 (L) 02/12/2024   HCT 40.2 02/12/2024   MCV 92.4 02/12/2024   PLT 226 02/12/2024      Chemistry      Component Value Date/Time   NA 139 02/12/2024  1135   NA 142 10/03/2018 0000   K 4.2 02/12/2024 1135   CL 100 02/12/2024 1135   CO2 35 (H) 02/12/2024 1135   BUN 20 02/12/2024 1135   BUN 19 10/03/2018 0000   CREATININE 0.98 02/12/2024 1135   CREATININE 0.73 04/10/2019 0857   GLU 144 10/03/2018 0000      Component Value Date/Time   CALCIUM  9.1 02/12/2024 1135   ALKPHOS 81 02/12/2024 1135   AST 19 02/12/2024 1135   ALT 13 02/12/2024 1135   BILITOT 0.4 02/12/2024 1135       Latest Reference Range & Units 02/12/24 11:35  Iron 45 - 182 ug/dL 39 (L)  UIBC ug/dL 731  TIBC 749 - 549 ug/dL 692  Saturation Ratios 17.9 - 39.5 % 13 (L)  Ferritin 24 - 336 ng/mL 53  Folate >5.9 ng/mL 5.3 (L)  Vitamin B12 180 - 914 pg/mL 440  (L): Data is abnormally low   Latest Reference Range & Units 02/12/24 11:35  CEA 0.0 - 4.7 ng/mL 1.6    RADIOGRAPHIC STUDIES: I have personally reviewed the radiological images as listed and agreed with the findings in the report.  CT CHEST ABDOMEN PELVIS W CONTRAST CLINICAL DATA:  Restaging rectal cancer.  * Tracking Code: BO *  EXAM: CT CHEST, ABDOMEN, AND PELVIS WITH CONTRAST  TECHNIQUE: Multidetector CT imaging of the chest, abdomen and pelvis was performed following the standard protocol during bolus administration of intravenous contrast.  RADIATION DOSE REDUCTION: This exam was performed according to the departmental dose-optimization program which includes automated exposure control, adjustment of the mA and/or kV according to patient size and/or use of iterative reconstruction technique.  CONTRAST:  100mL OMNIPAQUE  IOHEXOL  300 MG/ML  SOLN  COMPARISON:  Multiple prior imaging studies. The most recent CT scan is 08/13/2023  FINDINGS: CT CHEST FINDINGS  Cardiovascular: The heart is normal in size. No pericardial effusion. The aorta is normal in caliber. No dissection. Stable scattered atherosclerotic calcifications. Stable coronary artery calcifications. The  pulmonary arteries appear  normal.  Mediastinum/Nodes: No mediastinal or hilar mass or lymphadenopathy. Small scattered lymph nodes are stable. The esophagus is unremarkable.  Lungs/Pleura: Stable emphysematous changes and areas of pulmonary scarring. No new worrisome pulmonary nodules to suggest pulmonary metastatic disease.  Much improved appearance of the right lower lobe. Some persistent areas of irregular airspace consolidation and likely post pneumonic scarring. There is also debris noted in the lower right trachea and right mainstem bronchus and some component of ongoing aspiration would be a consideration. No pleural effusions or pleural lesions.  Musculoskeletal: No significant bony findings.  CT ABDOMEN PELVIS FINDINGS  Hepatobiliary: No findings for hepatic metastatic disease. The gallbladder is unremarkable. No biliary dilatation. The portal and hepatic veins are patent.  Pancreas: No mass, inflammation or ductal dilatation.  Spleen: Normal in size without focal abnormality.  Adrenals/Urinary Tract: The adrenal glands unremarkable and stable. No worrisome renal lesions, renal calculi or hydronephrosis. No collecting system abnormalities on the delayed images. The bladder remains significantly and diffusely thick walled with a new suprapubic catheter in place.  Stomach/Bowel: The stomach, duodenum and small bowel are unremarkable. No acute inflammatory process, mass lesions or obstructive findings. The terminal ileum and appendix are normal. Stable colonic diverticulosis most notably involving the descending and sigmoid colon. Mild stable rectal wall thickening but no discrete mass. No lymphadenopathy in the mesorectal space or sigmoid mesocolon.  Vascular/Lymphatic: Stable atherosclerotic calcifications involving the aorta, iliac arteries and branch vessels but no aneurysm or dissection. The major venous structures are patent. Stable small scattered mesenteric and retroperitoneal lymph  nodes but no mass or overt adenopathy. No pelvic or inguinal adenopathy.  Reproductive: Suspect UroLift clips in the prostate gland. The seminal vesicles are unremarkable.  Other: No ascites or omental disease. Small periumbilical abdominal hernia containing.  Musculoskeletal: No significant bony.  Lytic sclerotic bone lesions.  IMPRESSION: 1. Stable mild rectal wall thickening but no discrete mass. No mesorectal or sigmoid mesocolon lymphadenopathy. 2. No findings for metastatic disease involving the chest, abdomen or pelvis. 3. Much improved appearance of the right lower lobe. Some persistent areas of irregular airspace consolidation and likely post pneumonic scarring. There is also debris noted in the lower right trachea and right mainstem bronchus and some component of ongoing aspiration would be a consideration. 4. Stable emphysematous changes and areas of pulmonary scarring. 5. Stable atherosclerotic calcifications involving the thoracic and abdominal aorta and branch vessels including the coronary arteries. 6. Stable significant and diffusely thick walled bladder with a new suprapubic catheter in place.  Aortic Atherosclerosis (ICD10-I70.0) and Emphysema (ICD10-J43.9).  Electronically Signed   By: MYRTIS Stammer M.D.   On: 02/18/2024 15:09    CLINICAL DATA:  Rectal cancer, stage II/III/IV, monitor   EXAM: MRI PELVIS WITHOUT CONTRAST   TECHNIQUE: Multiplanar multisequence MR imaging of the pelvis was performed. No intravenous contrast was administered.   COMPARISON:  MRI pelvis from 11/28/2023.   FINDINGS: Urinary Tract: Urinary bladder is decompressed secondary to a suprapubic catheter. There is diffuse circumferential wall thickening and a small subcentimeter sized diverticulum along the posterior wall. No perivesical fat stranding.   Bowel: No disproportionate dilation of small or large bowel loops. There are multiple colonic diverticula without  diverticulitis.   Redemonstration of approximately 3-4 cm long segment of the lower third rectum exhibiting mild diffuse wall thickening from approximately 12 to 8 o'clock position with maximum thickness up to 5 mm at 2 o'clock position (series 5, image 27). The thickening appears homogeneous T2  hypointense and is compatible with adequate treatment response. No significant interval change since the prior study.   No recurrent tumor noted. No perirectal lymphadenopathy or fat stranding.   Vascular/Lymphatic: No pathologically enlarged lymph nodes. No significant vascular abnormality seen.   Reproductive: Heterogeneous, top normal-sized prostate gland. Symmetric bilateral seminal vesicles.   Other:  There are tiny bilateral fat containing inguinal hernias.   Musculoskeletal: No suspicious bone lesions identified. There is fatty marrow replacement of L5 vertebrae and sacrum, likely from prior radiotherapy.   IMPRESSION: *Stable exam. No recurrent or metastatic disease within the pelvis.     Electronically Signed   By: Ree Molt M.D.   On: 02/12/2024 11:57 "

## 2024-02-20 ENCOUNTER — Other Ambulatory Visit: Payer: Self-pay

## 2024-02-26 ENCOUNTER — Ambulatory Visit

## 2024-02-26 DIAGNOSIS — R339 Retention of urine, unspecified: Secondary | ICD-10-CM

## 2024-02-26 MED ORDER — CIPROFLOXACIN HCL 500 MG PO TABS
500.0000 mg | ORAL_TABLET | Freq: Once | ORAL | Status: AC
  Administered 2024-02-26: 500 mg via ORAL

## 2024-02-26 NOTE — Progress Notes (Addendum)
 Suprapubic Cath Change  Patient is present today for a suprapubic catheter change due to urinary retention.  10 ml of water  was drained from the balloon, a 16 FR foley cath was removed from the tract with out difficulty.  Suprapubic catheter site was cleaned and prepped in a sterile fashion with [id number]  A 16 FR foley cath was replaced into the tract no complications were noted. Urine return was noted, Flush 70 cc  Bloody in color . 10 ml of sterile water  was inflated into the balloon and a night bag was attached for drainage.  Patient tolerated well. A night bag was given to patient and proper instruction was given on how to switch bags.    Performed by: Carlos, CMA  Follow up: 4 weeks SP Tube Change

## 2024-02-28 ENCOUNTER — Inpatient Hospital Stay

## 2024-02-28 VITALS — BP 134/71 | HR 55 | Temp 97.8°F | Resp 18

## 2024-02-28 DIAGNOSIS — D509 Iron deficiency anemia, unspecified: Secondary | ICD-10-CM

## 2024-02-28 MED ORDER — ACETAMINOPHEN 325 MG PO TABS
650.0000 mg | ORAL_TABLET | Freq: Once | ORAL | Status: AC
  Administered 2024-02-28: 650 mg via ORAL
  Filled 2024-02-28: qty 2

## 2024-02-28 MED ORDER — SODIUM CHLORIDE 0.9 % IV SOLN: INTRAVENOUS | Status: DC

## 2024-02-28 MED ORDER — SODIUM CHLORIDE 0.9 % IV SOLN
510.0000 mg | Freq: Once | INTRAVENOUS | Status: AC
  Administered 2024-02-28: 510 mg via INTRAVENOUS
  Filled 2024-02-28: qty 510

## 2024-02-28 MED ORDER — CETIRIZINE HCL 10 MG PO TABS
10.0000 mg | ORAL_TABLET | Freq: Once | ORAL | Status: AC
  Administered 2024-02-28: 10 mg via ORAL
  Filled 2024-02-28: qty 1

## 2024-02-28 NOTE — Progress Notes (Signed)
Patient presents today for iron infusion. Patient is in satisfactory condition with no new complaints voiced.  Vital signs are stable.  We will proceed with infusion per provider orders.    Feraheme 510 mg given today per MD orders. Tolerated infusion without adverse affects. Vital signs stable. No complaints at this time. Discharged from clinic ambulatory in stable condition. Alert and oriented x 3. F/U with [facility] as scheduled.

## 2024-02-28 NOTE — Patient Instructions (Signed)

## 2024-03-06 ENCOUNTER — Inpatient Hospital Stay

## 2024-03-13 ENCOUNTER — Inpatient Hospital Stay

## 2024-03-27 ENCOUNTER — Inpatient Hospital Stay: Attending: Oncology

## 2024-04-01 ENCOUNTER — Ambulatory Visit

## 2024-05-20 ENCOUNTER — Ambulatory Visit (HOSPITAL_COMMUNITY)

## 2024-05-20 ENCOUNTER — Inpatient Hospital Stay

## 2024-05-27 ENCOUNTER — Inpatient Hospital Stay: Admitting: Oncology
